# Patient Record
Sex: Male | Born: 1960 | Race: Black or African American | Hispanic: No | Marital: Married | Smoking: Never smoker
Health system: Southern US, Community
[De-identification: ages and names within clinical notes are randomized; demographics above are authoritative.]

## PROBLEM LIST (undated history)

## (undated) DIAGNOSIS — I639 Cerebral infarction, unspecified: Secondary | ICD-10-CM

## (undated) DIAGNOSIS — G629 Polyneuropathy, unspecified: Secondary | ICD-10-CM

## (undated) DIAGNOSIS — I10 Essential (primary) hypertension: Secondary | ICD-10-CM

## (undated) DIAGNOSIS — N289 Disorder of kidney and ureter, unspecified: Secondary | ICD-10-CM

## (undated) DIAGNOSIS — E119 Type 2 diabetes mellitus without complications: Secondary | ICD-10-CM

## (undated) HISTORY — PX: TONSILLECTOMY: SUR1361

---

## 2016-07-27 ENCOUNTER — Emergency Department
Admission: EM | Admit: 2016-07-27 | Discharge: 2016-07-27 | Disposition: A | Discharge status: Home / Self Care | Payer: Medicaid Other | Attending: Emergency Medicine | Admitting: Emergency Medicine

## 2016-07-27 DIAGNOSIS — E860 Dehydration: Secondary | ICD-10-CM | POA: Diagnosis not present

## 2016-07-27 DIAGNOSIS — N179 Acute kidney failure, unspecified: Secondary | ICD-10-CM | POA: Diagnosis not present

## 2016-07-27 DIAGNOSIS — R252 Cramp and spasm: Secondary | ICD-10-CM | POA: Diagnosis present

## 2016-07-27 LAB — CBC WITH DIFFERENTIAL/PLATELET
BASOS PCT: 1 %
Basophils Absolute: 0 10*3/uL (ref 0–0.1)
Eosinophils Absolute: 0.4 10*3/uL (ref 0–0.7)
Eosinophils Relative: 4 %
HEMATOCRIT: 38.9 % — AB (ref 40.0–52.0)
Hemoglobin: 13 g/dL (ref 13.0–18.0)
LYMPHS ABS: 1.4 10*3/uL (ref 1.0–3.6)
LYMPHS PCT: 15 %
MCH: 25 pg — AB (ref 26.0–34.0)
MCHC: 33.4 g/dL (ref 32.0–36.0)
MCV: 74.8 fL — AB (ref 80.0–100.0)
MONO ABS: 0.8 10*3/uL (ref 0.2–1.0)
MONOS PCT: 9 %
NEUTROS ABS: 6.8 10*3/uL — AB (ref 1.4–6.5)
Neutrophils Relative %: 71 %
Platelets: 230 10*3/uL (ref 150–440)
RBC: 5.2 MIL/uL (ref 4.40–5.90)
RDW: 14.9 % — AB (ref 11.5–14.5)
WBC: 9.5 10*3/uL (ref 3.8–10.6)

## 2016-07-27 LAB — COMPREHENSIVE METABOLIC PANEL
ALK PHOS: 143 U/L — AB (ref 38–126)
ALT: 25 U/L (ref 17–63)
ANION GAP: 7 (ref 5–15)
AST: 32 U/L (ref 15–41)
Albumin: 3.7 g/dL (ref 3.5–5.0)
BUN: 33 mg/dL — ABNORMAL HIGH (ref 6–20)
CALCIUM: 8.9 mg/dL (ref 8.9–10.3)
CO2: 27 mmol/L (ref 22–32)
Chloride: 105 mmol/L (ref 101–111)
Creatinine, Ser: 2.01 mg/dL — ABNORMAL HIGH (ref 0.61–1.24)
GFR, EST AFRICAN AMERICAN: 41 mL/min — AB (ref >60)
GFR, EST NON AFRICAN AMERICAN: 35 mL/min — AB (ref >60)
Glucose, Bld: 124 mg/dL — ABNORMAL HIGH (ref 65–99)
POTASSIUM: 3.8 mmol/L (ref 3.5–5.1)
Sodium: 139 mmol/L (ref 135–145)
TOTAL PROTEIN: 7.2 g/dL (ref 6.5–8.1)

## 2016-07-27 LAB — BASIC METABOLIC PANEL
Anion gap: 7 (ref 5–15)
BUN: 33 mg/dL — AB (ref 6–20)
CALCIUM: 8.6 mg/dL — AB (ref 8.9–10.3)
CHLORIDE: 105 mmol/L (ref 101–111)
CO2: 26 mmol/L (ref 22–32)
CREATININE: 1.8 mg/dL — AB (ref 0.61–1.24)
GFR, EST AFRICAN AMERICAN: 47 mL/min — AB (ref >60)
GFR, EST NON AFRICAN AMERICAN: 40 mL/min — AB (ref >60)
Glucose, Bld: 177 mg/dL — ABNORMAL HIGH (ref 65–99)
Potassium: 4 mmol/L (ref 3.5–5.1)
SODIUM: 138 mmol/L (ref 135–145)

## 2016-07-27 LAB — CK: CK TOTAL: 682 U/L — AB (ref 49–397)

## 2016-07-27 LAB — GLUCOSE, CAPILLARY: GLUCOSE-CAPILLARY: 231 mg/dL — AB (ref 65–99)

## 2016-07-27 LAB — TROPONIN I

## 2016-07-27 MED ORDER — SODIUM CHLORIDE 0.9 % IV BOLUS (SEPSIS)
1000.0000 mL | Freq: Once | INTRAVENOUS | Status: AC
  Administered 2016-07-27: 1000 mL via INTRAVENOUS

## 2016-07-27 NOTE — ED Notes (Signed)
MD Jimmye Norman ordered BMP to be drawn after completion of NaCl bolus

## 2016-07-27 NOTE — ED Notes (Addendum)
Reviewed d/c instructions, follow-up care with pt. Pt verbalized understanding 

## 2016-07-27 NOTE — ED Notes (Signed)
Pt c/o generalized body cramps beginning today. Pt had been up for 4 days getting ready for daughter's wedding.

## 2016-07-27 NOTE — ED Triage Notes (Signed)
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

## 2016-07-27 NOTE — ED Provider Notes (Signed)
[facility] Emergency Department Provider Note        Time seen: ----------------------------------------- 7:07 PM on 07/27/2016 -----------------------------------------    I have reviewed the triage vital signs and the nursing notes.   HISTORY  Chief Complaint Dehydration    HPI Bobby Sampson is a 55 y.o. male who presents the ER from home after sudden muscle cramping all over his body. Patient states he has been sleeping well for the past 4 days because his been cooking for his daughter's wedding. Patient states his only slept a few hours, has not been drinking alcohol but felt dehydrated. After 500 cc of normal saline he felt some better but then the cramping restarted. He denies fever or recent illness. He denies any complaints at this time.   No past medical history on file.  There are no active problems to display for this patient.   No past surgical history on file.  Allergies Review of patient's allergies indicates no known allergies.  Social History Social History  Substance Use Topics  . Smoking status: Not on file  . Smokeless tobacco: Not on file  . Alcohol use Not on file    Review of Systems Constitutional: Negative for fever. Cardiovascular: Negative for chest pain. Respiratory: Negative for shortness of breath. Gastrointestinal: Negative for abdominal pain, vomiting and diarrhea. Genitourinary: Negative for dysuria. Musculoskeletal: Positive for muscle cramps Skin: Negative for rash. Neurological: Negative for headaches, positive for generalized weakness  10-point ROS otherwise negative.  ____________________________________________   PHYSICAL EXAM:  VITAL SIGNS: ED Triage Vitals  Enc Vitals Group     BP 07/27/16 1903 (!) 147/77     Pulse Rate 07/27/16 1903 91     Resp 07/27/16 1903 18     Temp 07/27/16 1903 97.7 F (36.5 C)     Temp Source 07/27/16 1903 Oral     SpO2 07/27/16 1903 99 %     Weight 07/27/16 1904  225 lb (102.1 kg)     Height 07/27/16 1904 5\' 9"  (1.753 m)     Head Circumference --      Peak Flow --      Pain Score 07/27/16 1905 3     Pain Loc --      Pain Edu? --      Excl. in [place]? --     Constitutional: Alert and oriented. Well appearing and in no distress. Eyes: Conjunctivae are normal. PERRL. Normal extraocular movements. ENT   Head: Normocephalic and atraumatic.   Nose: No congestion/rhinnorhea.   Mouth/Throat: Mucous membranes are moist.   Neck: No stridor. Cardiovascular: Normal rate, regular rhythm. No murmurs, rubs, or gallops. Respiratory: Normal respiratory effort without tachypnea nor retractions. Breath sounds are clear and equal bilaterally. No wheezes/rales/rhonchi. Gastrointestinal: Soft and nontender. Normal bowel sounds Musculoskeletal: Nontender with normal range of motion in all extremities. No lower extremity tenderness nor edema. Neurologic:  Normal speech and language. No gross focal neurologic deficits are appreciated.  Skin:  Skin is warm, dry and intact. No rash noted. Psychiatric: Mood and affect are normal. Speech and behavior are normal.  ___________________________________________  ED COURSE:  Pertinent labs & imaging results that were available during my care of the patient were reviewed by me and considered in my medical decision making (see chart for details). Clinical Course  Patient is in no distress, I will check basic labs and continue saline infusion.  Procedures ____________________________________________   LABS (pertinent positives/negatives)  Labs Reviewed  CBC WITH DIFFERENTIAL/PLATELET - Abnormal; Notable  for the following:       Result Value   HCT 38.9 (*)    MCV 74.8 (*)    MCH 25.0 (*)    RDW 14.9 (*)    Neutro Abs 6.8 (*)    All other components within normal limits  COMPREHENSIVE METABOLIC PANEL - Abnormal; Notable for the following:    Glucose, Bld 124 (*)    BUN 33 (*)    Creatinine, Ser 2.01 (*)     Alkaline Phosphatase 143 (*)    Total Bilirubin <0.1 (*)    GFR calc non Af Amer 35 (*)    GFR calc Af Amer 41 (*)    All other components within normal limits  CK - Abnormal; Notable for the following:    Total CK 682 (*)    All other components within normal limits  BASIC METABOLIC PANEL - Abnormal; Notable for the following:    Glucose, Bld 177 (*)    BUN 33 (*)    Creatinine, Ser 1.80 (*)    Calcium 8.6 (*)    GFR calc non Af Amer 40 (*)    GFR calc Af Amer 47 (*)    All other components within normal limits  TROPONIN I   ____________________________________________  FINAL ASSESSMENT AND PLAN  Dehydration, muscle cramps, Acute kidney injury  Plan: Patient with labs as dictated above. Patient did show improvement in his creatinine after 2.5 L of IV fluid. He currently feels better, I will advise follow-up in the next 1-2 days with his doctor for recheck. Advised light activity and increase by mouth intake. He is stable for discharge.   Earleen Newport, MD   Note: This dictation was prepared with Dragon dictation. Any transcriptional errors that result from this process are unintentional    Earleen Newport, MD 07/27/16 2151

## 2016-11-21 ENCOUNTER — Other Ambulatory Visit: Payer: Self-pay | Admitting: Otolaryngology

## 2016-11-21 DIAGNOSIS — H9012 Conductive hearing loss, unilateral, left ear, with unrestricted hearing on the contralateral side: Secondary | ICD-10-CM

## 2016-11-21 DIAGNOSIS — H9212 Otorrhea, left ear: Secondary | ICD-10-CM

## 2016-11-27 ENCOUNTER — Ambulatory Visit
Admission: RE | Admit: 2016-11-27 | Discharge: 2016-11-27 | Disposition: A | Discharge status: Home / Self Care | Payer: No Typology Code available for payment source | Source: Ambulatory Visit | Attending: Otolaryngology | Admitting: Otolaryngology

## 2016-11-27 ENCOUNTER — Ambulatory Visit: Payer: Medicaid Other

## 2016-11-27 DIAGNOSIS — H9012 Conductive hearing loss, unilateral, left ear, with unrestricted hearing on the contralateral side: Secondary | ICD-10-CM | POA: Diagnosis not present

## 2016-11-27 DIAGNOSIS — G936 Cerebral edema: Secondary | ICD-10-CM | POA: Diagnosis not present

## 2016-11-27 DIAGNOSIS — H9212 Otorrhea, left ear: Secondary | ICD-10-CM | POA: Insufficient documentation

## 2016-11-27 IMAGING — CT CT TEMPORAL BONES W/O CM
1 of 2 series · 15 of 30 positions shown, 19 images · non-contrast
Comparison: None.

CLINICAL DATA: Conductive hearing loss left ear with otorrhea

EXAM:
CT TEMPORAL BONES WITHOUT CONTRAST
TECHNIQUE: Axial and coronal plane CT imaging of the petrous temporal bones was
performed with thin-collimation image reconstruction. No intravenous
contrast was administered. Multiplanar CT image reconstructions were
also generated.

[Series 5: ax mag right · axial · 0.20mm/px · z∈[-87,-34]mm · 15 of 98 slices shown, 19 images]
[im 5/98  brain]
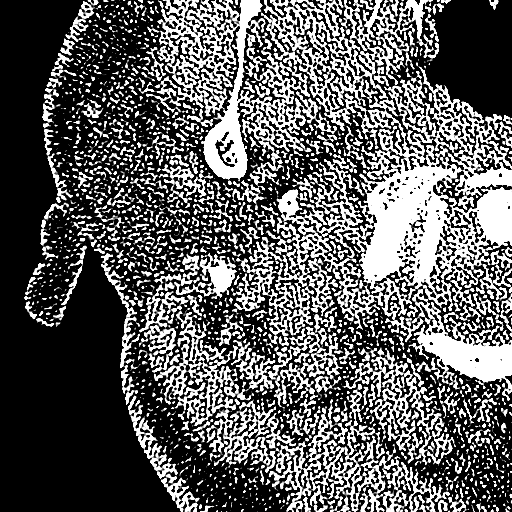
[im 5/98  bone]
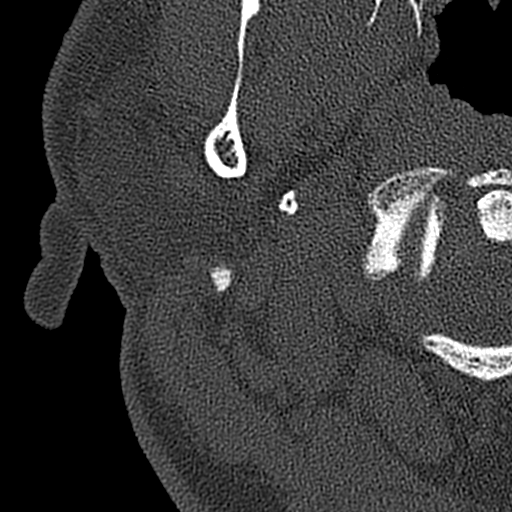
[im 14/98  bone]
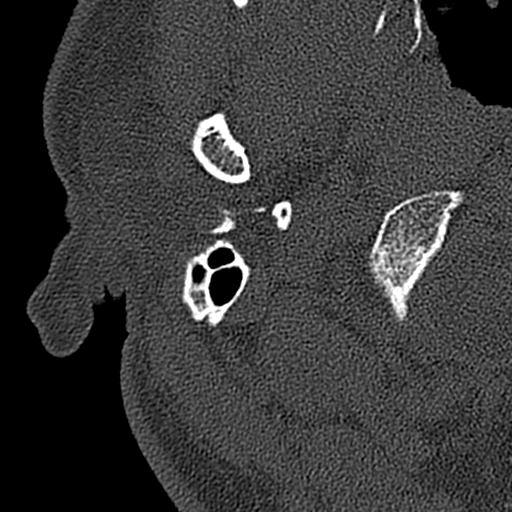
[im 18/98  bone]
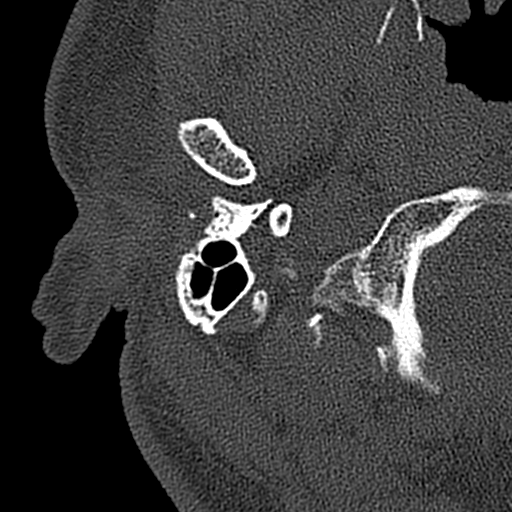
[im 23/98  bone]
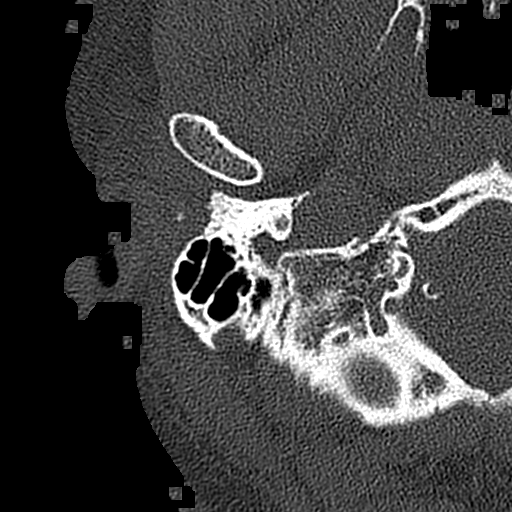
[im 31/98  brain]
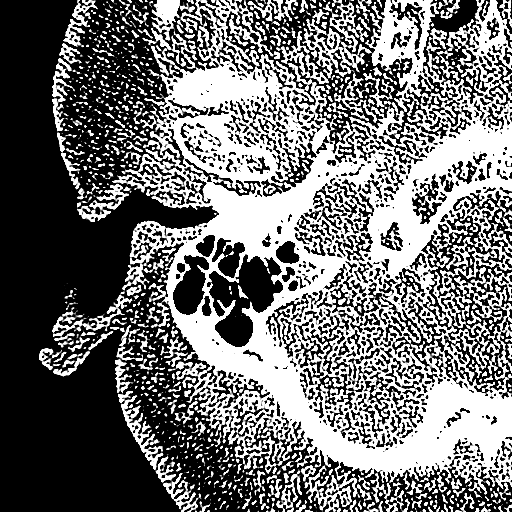
[im 31/98  bone]
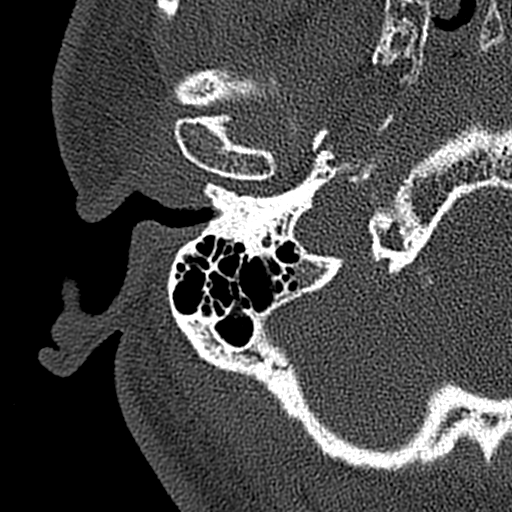
[im 36/98  bone]
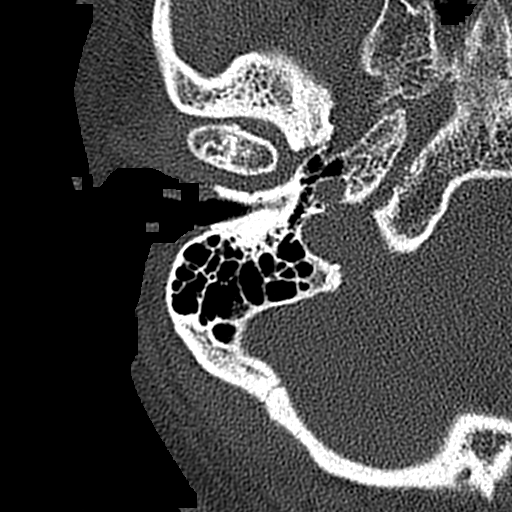
[im 45/98  bone]
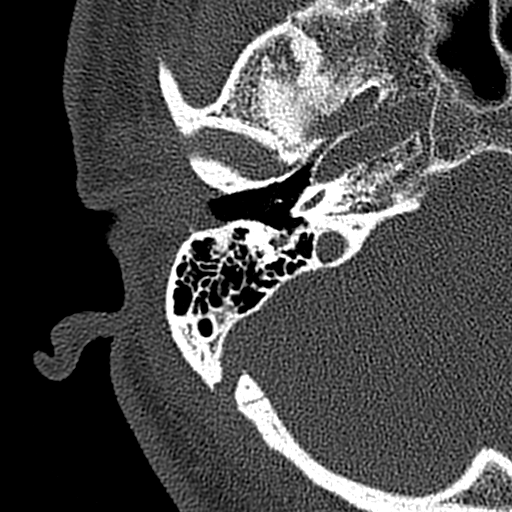
[im 49/98  bone]
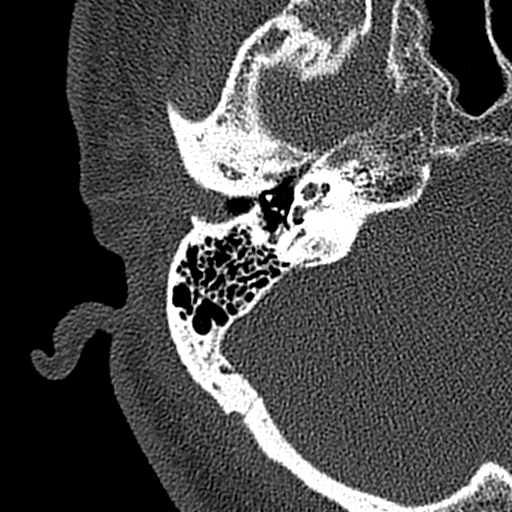
[im 53/98  brain]
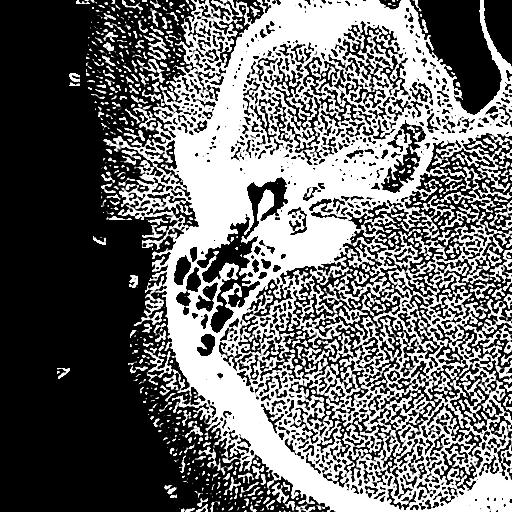
[im 53/98  bone]
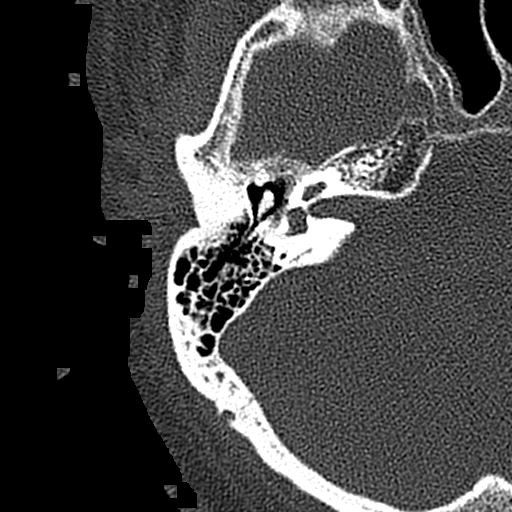
[im 62/98  bone]
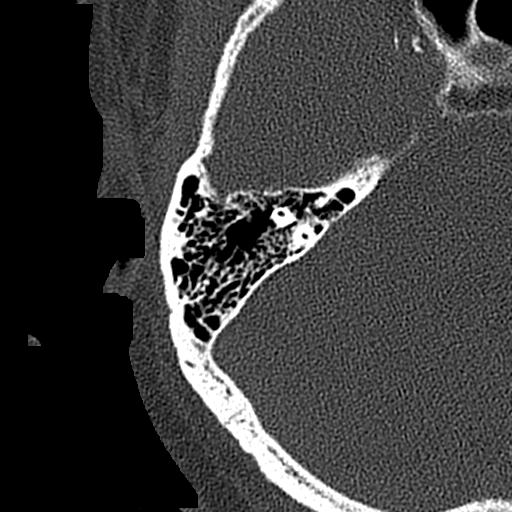
[im 67/98  bone]
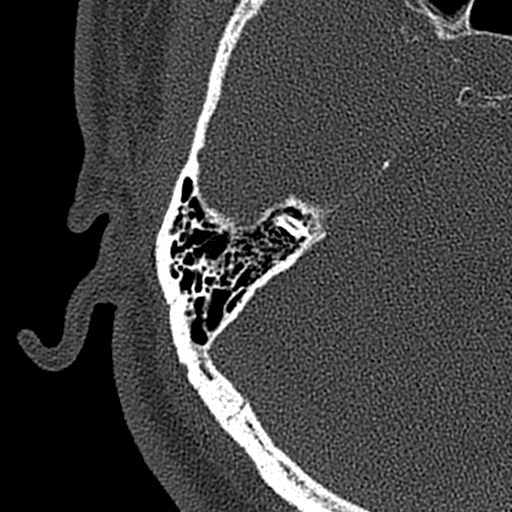
[im 75/98  bone]
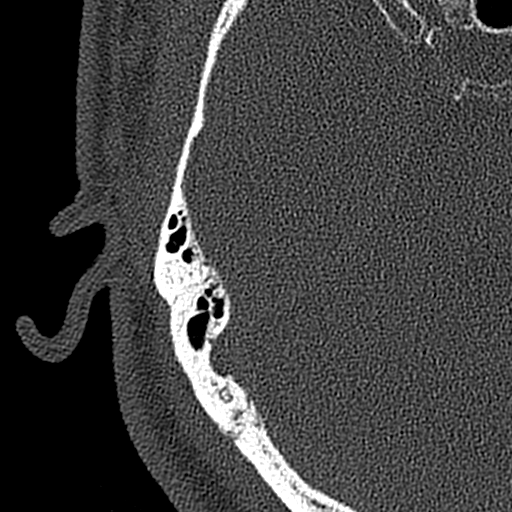
[im 80/98  brain]
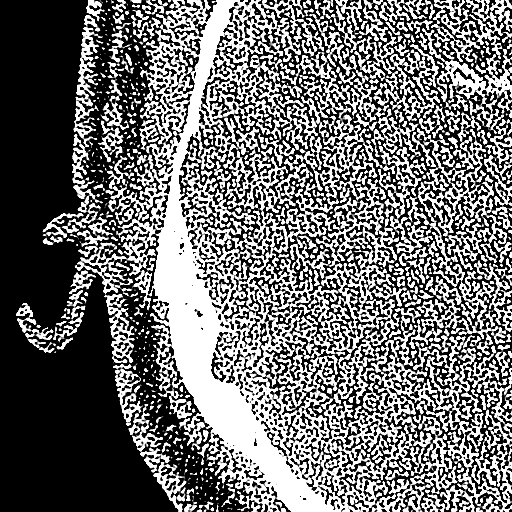
[im 80/98  bone]
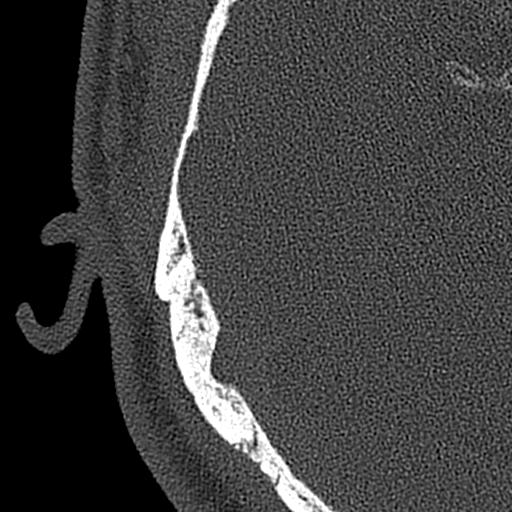
[im 84/98  bone]
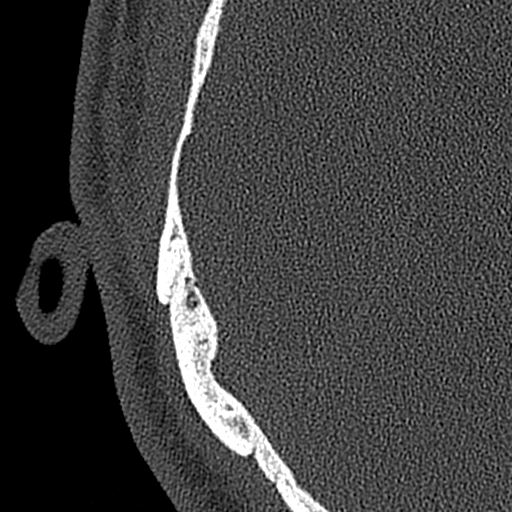
[im 93/98  bone]
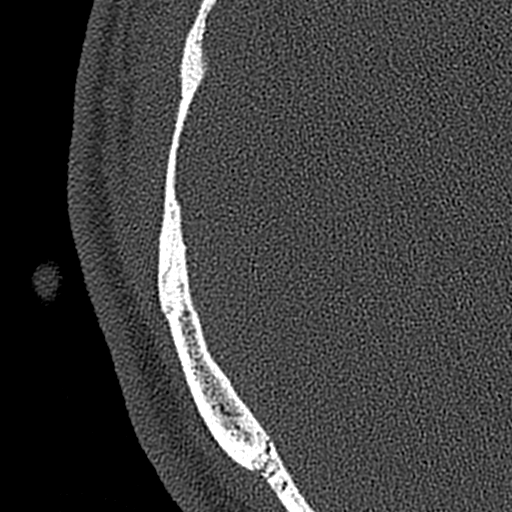

[15 of 30 positions shown; findings below may reference images not displayed]

FINDINGS: Mucosal edema in the paranasal sinuses. Central skullbase intact.
Degenerative changes in the right TMJ with cyst in the mandibular
condyle.

Right temporal bone: Right mastoid sinus well developed and clear.
Right middle ear clear. [place] normal. Tympanic membrane normal.
Negative for cholesteatoma. Internal and external auditory canal
normal. Cochlea and semi circular canals normal.

Left temporal bone: Left mastoid sinus well developed. Large left
mastoid effusion. Lateral wall the mastoid sinus is ill-defined with
bony erosion. There is considerable soft tissue swelling lateral to
the mastoid tip which may be due to infection. Subperiosteal abscess
to be difficult to exclude without intravenous contrast. There is
extensive soft tissue swelling extending into the external auditory
canal which is markedly narrowed and appears occluded. Mild
thickening of the tympanic membrane. Ossicles normal. Negative for
cholesteatoma. Internal auditory canal normal. Cochlea and semi
circular canals normal.
IMPRESSION: Left mastoid sinus effusion. Irregular erosion of the lateral wall
of the mastoid sinus with adjacent soft tissue swelling. Findings
consistent with bone infection and adjacent soft tissue swelling.
Subperiosteal abscess difficult to exclude without intravenous
contrast. Extensive soft tissue swelling filling the left external
auditory canal. Mild thickening of the tympanic membrane without
significant middle ear effusion. Negative for cholesteatoma

Normal right temporal bone.

## 2017-04-06 ENCOUNTER — Other Ambulatory Visit: Payer: Self-pay | Admitting: Otolaryngology

## 2017-04-06 DIAGNOSIS — H7012 Chronic mastoiditis, left ear: Secondary | ICD-10-CM

## 2017-04-09 ENCOUNTER — Ambulatory Visit
Admission: RE | Admit: 2017-04-09 | Discharge: 2017-04-09 | Disposition: A | Discharge status: Home / Self Care | Payer: No Typology Code available for payment source | Source: Ambulatory Visit | Attending: Otolaryngology | Admitting: Otolaryngology

## 2017-04-09 DIAGNOSIS — H7012 Chronic mastoiditis, left ear: Secondary | ICD-10-CM | POA: Insufficient documentation

## 2017-04-09 IMAGING — CT CT TEMPORAL BONES W/O CM
2 of 3 series · 15 of 30 positions shown, 18 images · non-contrast
Comparison: CT temporal done [DATE]

CLINICAL DATA: Chronic left-sided mastoiditis. Left-sided ear
infection and hearing loss.

EXAM:
CT TEMPORAL BONES WITHOUT CONTRAST
TECHNIQUE: Axial and coronal plane CT imaging of the petrous temporal bones was
performed with thin-collimation image reconstruction. No intravenous
contrast was administered. Multiplanar CT image reconstructions were
also generated.

[Series 5: ax mag right · axial · 0.20mm/px · z∈[-81,-20]mm · 11 of 123 slices shown, 14 images]
[im 11/123  brain]
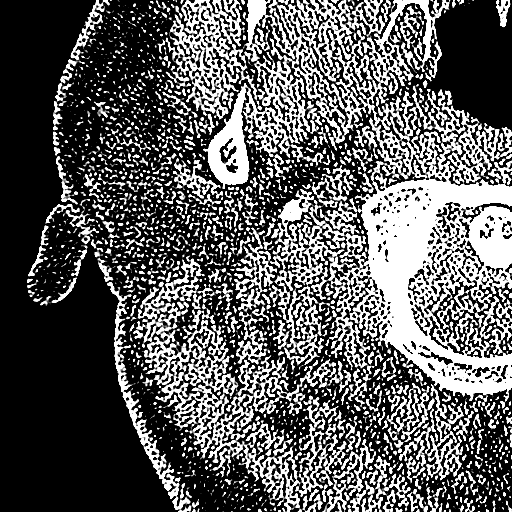
[im 11/123  bone]
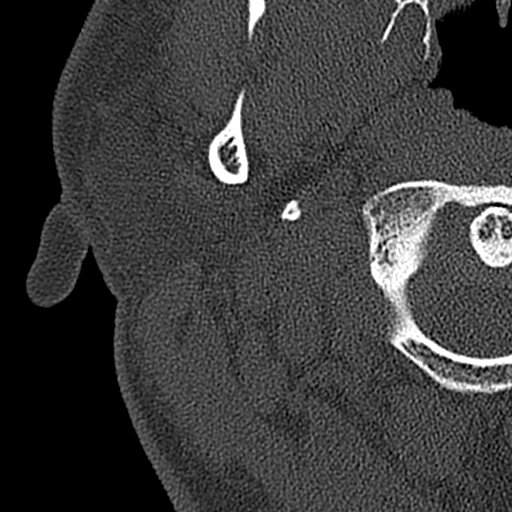
[im 21/123  bone]
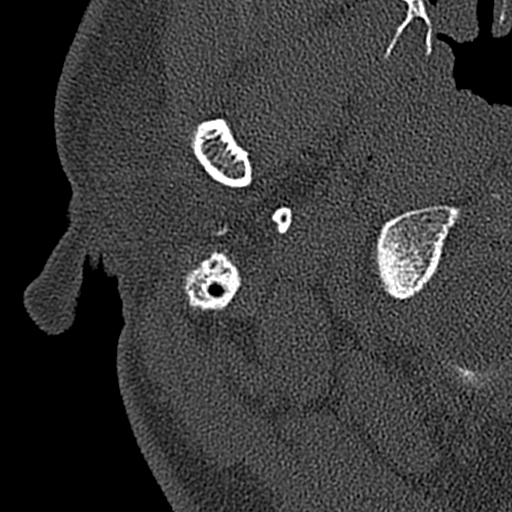
[im 31/123  bone]
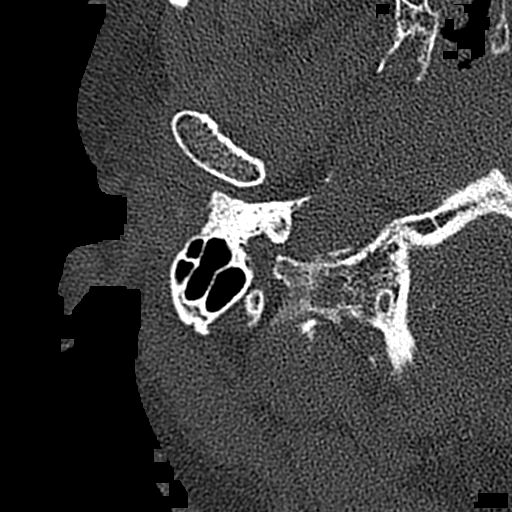
[im 41/123  bone]
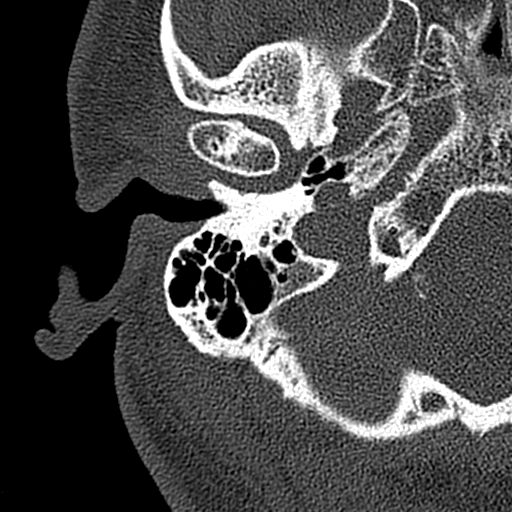
[im 51/123  brain]
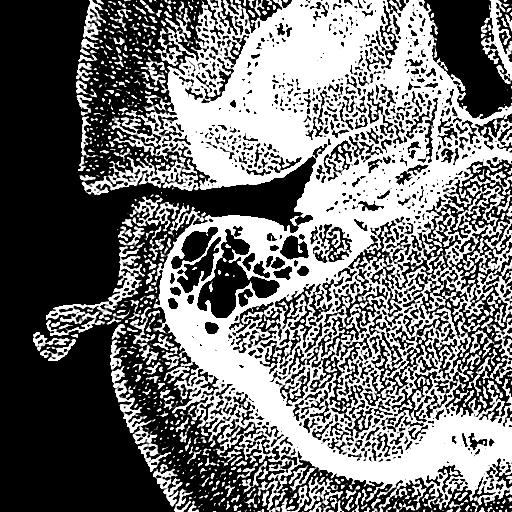
[im 51/123  bone]
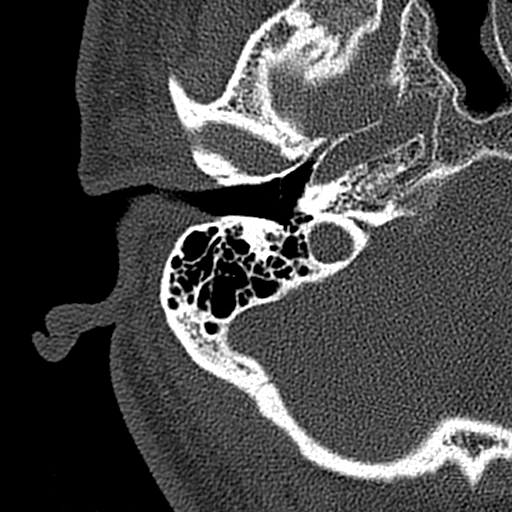
[im 62/123  bone]
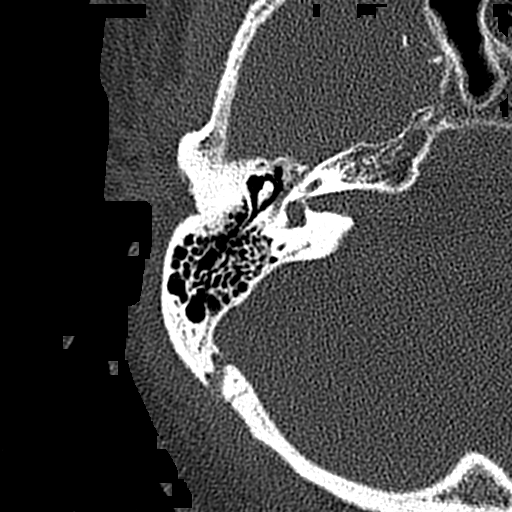
[im 72/123  bone]
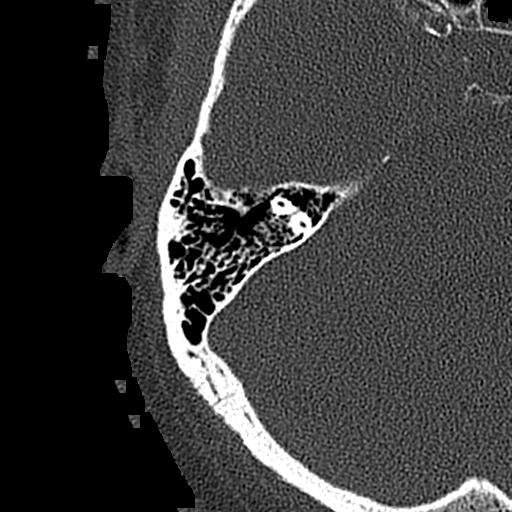
[im 82/123  bone]
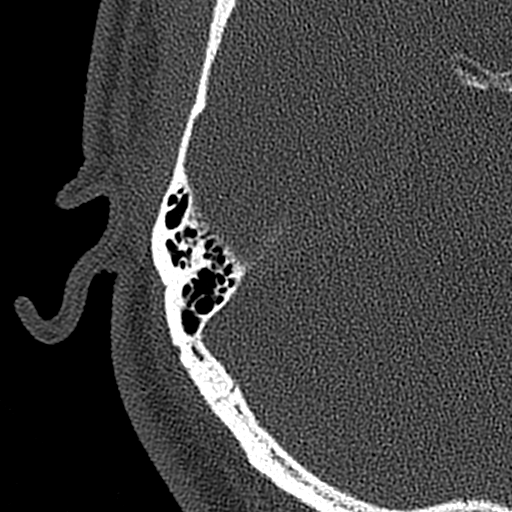
[im 92/123  brain]
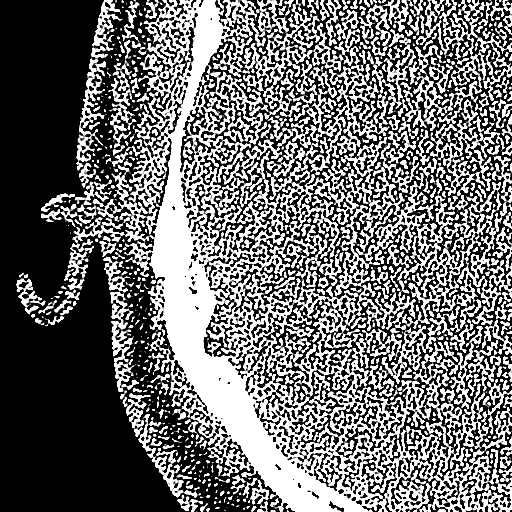
[im 92/123  bone]
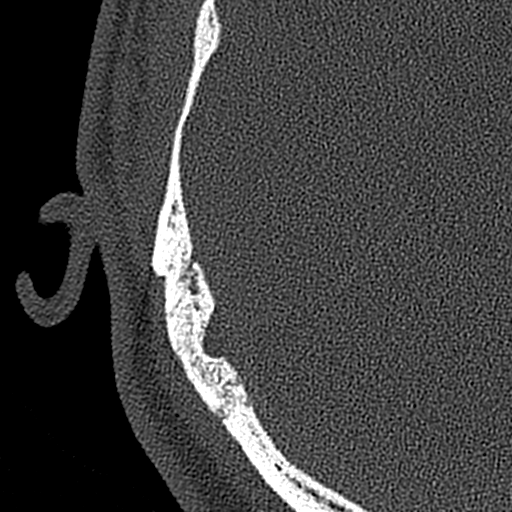
[im 102/123  bone]
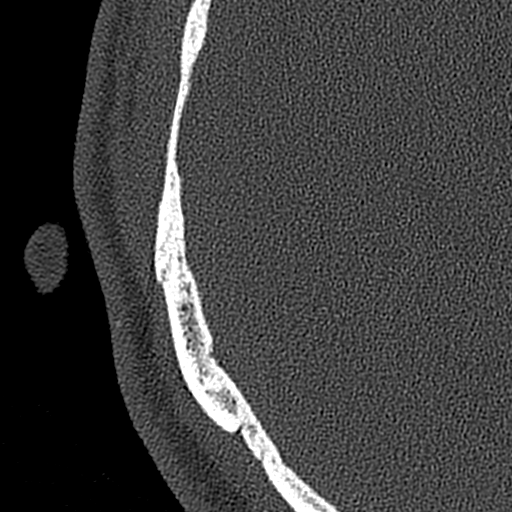
[im 112/123  bone]
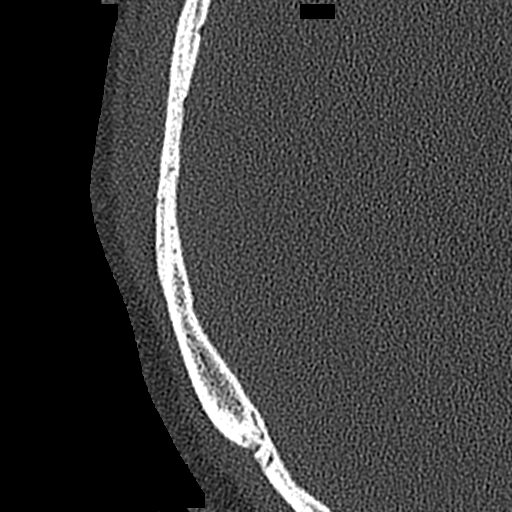

[Series 7: ax mag left · axial · 0.20mm/px · z∈[-81,-57]mm · 4 of 123 slices shown]
[im 11/123  bone]
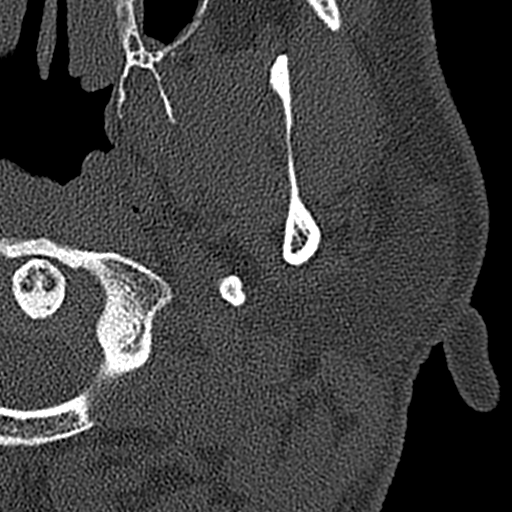
[im 31/123  bone]
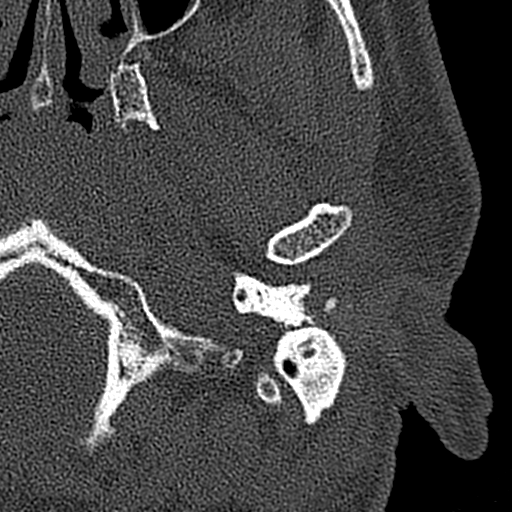
[im 41/123  bone]
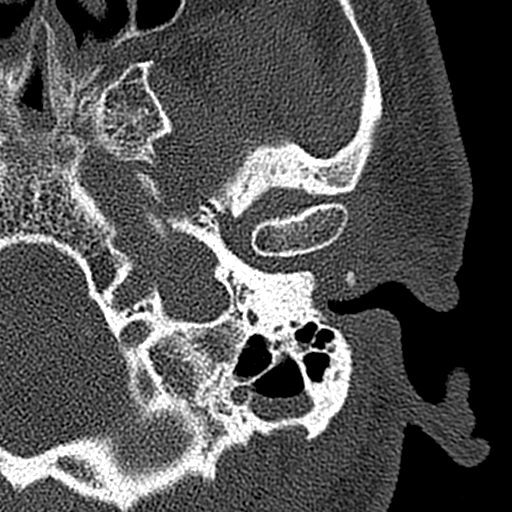
[im 51/123  bone]
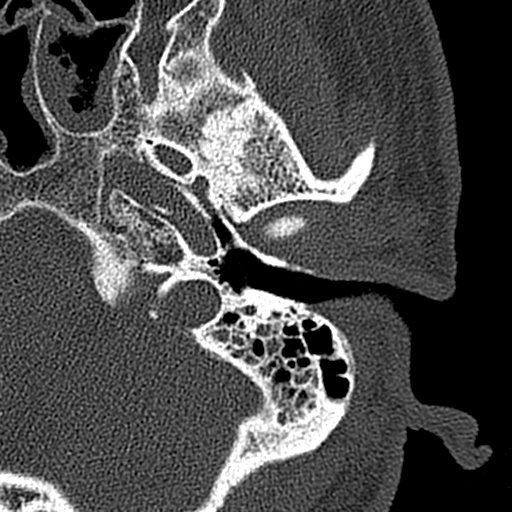

[15 of 30 positions shown; findings below may reference images not displayed]

FINDINGS: Limited imaging of the brain is within normal limits. A left lens
replacement is noted. The globes and orbits are otherwise within
normal limits.

Circumferential mucosal thickening is present in the sphenoid
sinuses bilaterally, left greater than right. There is some fluid in
the left sphenoid sinus is well. Circumferential mucosal thickening
is noted in the left maxillary sinus.

The right external auditory canal is clear. The tympanic membrane is
visualized and intact. The middle ear cavity is clear. The middle
ear ossicles are normally formed and articulating. The oval window
is patent. The epitympanum and mastoid air cells are clear. The
inner ear structures are normally formed. The superior and lateral
semicircular canals are covered. The internal auditory canal and
vestibular aqueduct are within normal limits.

The right external auditory canal is clear. The tympanic membrane is
visualized and intact. The left middle ear ossicles are normally
formed and articulating. Previously noted EAC mucosal disease and
tympanic membrane thickening has resolved. The middle ear cavity and
epitympanum are clear. There is some residual fluid within inferior
ethmoid air cells. Previous erosion along the lateral mastoid and
extensive soft tissue swelling has resolved. The bone appears to
have healed. There is no fistula evident.

The left inner ear structures are normally formed. The left superior
and lateral semicircular canals are covered. The internal auditory
canal and vestibular aqueduct are within normal limits.
IMPRESSION: 1. Interval healing of permeative bone changes in the left mastoid
and resolution of extensive soft tissue swelling.
2. Resolution of mucosal disease within the left external auditory
canal and thickening of the left tympanic membrane.
3. There is some residual fluid in the inferior left mastoid air
cells without coalescence.
4. The right temporal bone is within normal limits.
5. Left maxillary and bilateral sphenoid sinus disease as described.

## 2019-11-07 ENCOUNTER — Emergency Department: Payer: Medicaid Other

## 2019-11-07 ENCOUNTER — Other Ambulatory Visit: Payer: Self-pay

## 2019-11-07 ENCOUNTER — Inpatient Hospital Stay
Admission: EM | Admit: 2019-11-07 | Discharge: 2019-11-11 | DRG: 177 | Disposition: A | Discharge status: Home / Self Care | Payer: Medicaid Other | Attending: Internal Medicine | Admitting: Internal Medicine

## 2019-11-07 DIAGNOSIS — E1122 Type 2 diabetes mellitus with diabetic chronic kidney disease: Secondary | ICD-10-CM | POA: Diagnosis present

## 2019-11-07 DIAGNOSIS — Z794 Long term (current) use of insulin: Secondary | ICD-10-CM

## 2019-11-07 DIAGNOSIS — K219 Gastro-esophageal reflux disease without esophagitis: Secondary | ICD-10-CM | POA: Diagnosis present

## 2019-11-07 DIAGNOSIS — E1165 Type 2 diabetes mellitus with hyperglycemia: Secondary | ICD-10-CM | POA: Diagnosis present

## 2019-11-07 DIAGNOSIS — J1289 Other viral pneumonia: Secondary | ICD-10-CM | POA: Diagnosis present

## 2019-11-07 DIAGNOSIS — Z91128 Patient's intentional underdosing of medication regimen for other reason: Secondary | ICD-10-CM | POA: Diagnosis not present

## 2019-11-07 DIAGNOSIS — E785 Hyperlipidemia, unspecified: Secondary | ICD-10-CM | POA: Diagnosis present

## 2019-11-07 DIAGNOSIS — N189 Chronic kidney disease, unspecified: Secondary | ICD-10-CM | POA: Diagnosis not present

## 2019-11-07 DIAGNOSIS — U071 COVID-19: Secondary | ICD-10-CM | POA: Diagnosis present

## 2019-11-07 DIAGNOSIS — E875 Hyperkalemia: Secondary | ICD-10-CM

## 2019-11-07 DIAGNOSIS — E1159 Type 2 diabetes mellitus with other circulatory complications: Secondary | ICD-10-CM

## 2019-11-07 DIAGNOSIS — E1142 Type 2 diabetes mellitus with diabetic polyneuropathy: Secondary | ICD-10-CM | POA: Diagnosis present

## 2019-11-07 DIAGNOSIS — T383X6A Underdosing of insulin and oral hypoglycemic [antidiabetic] drugs, initial encounter: Secondary | ICD-10-CM | POA: Diagnosis present

## 2019-11-07 DIAGNOSIS — I129 Hypertensive chronic kidney disease with stage 1 through stage 4 chronic kidney disease, or unspecified chronic kidney disease: Secondary | ICD-10-CM | POA: Diagnosis present

## 2019-11-07 DIAGNOSIS — E11621 Type 2 diabetes mellitus with foot ulcer: Secondary | ICD-10-CM | POA: Diagnosis present

## 2019-11-07 DIAGNOSIS — L97529 Non-pressure chronic ulcer of other part of left foot with unspecified severity: Secondary | ICD-10-CM | POA: Diagnosis present

## 2019-11-07 DIAGNOSIS — J9601 Acute respiratory failure with hypoxia: Secondary | ICD-10-CM | POA: Diagnosis present

## 2019-11-07 DIAGNOSIS — Z79899 Other long term (current) drug therapy: Secondary | ICD-10-CM

## 2019-11-07 DIAGNOSIS — N1831 Chronic kidney disease, stage 3a: Secondary | ICD-10-CM | POA: Diagnosis present

## 2019-11-07 DIAGNOSIS — K5900 Constipation, unspecified: Secondary | ICD-10-CM | POA: Diagnosis present

## 2019-11-07 DIAGNOSIS — I10 Essential (primary) hypertension: Secondary | ICD-10-CM | POA: Diagnosis not present

## 2019-11-07 DIAGNOSIS — N179 Acute kidney failure, unspecified: Secondary | ICD-10-CM

## 2019-11-07 DIAGNOSIS — L97509 Non-pressure chronic ulcer of other part of unspecified foot with unspecified severity: Secondary | ICD-10-CM | POA: Diagnosis not present

## 2019-11-07 HISTORY — DX: Polyneuropathy, unspecified: G62.9

## 2019-11-07 HISTORY — DX: Essential (primary) hypertension: I10

## 2019-11-07 HISTORY — DX: Disorder of kidney and ureter, unspecified: N28.9

## 2019-11-07 HISTORY — DX: Type 2 diabetes mellitus without complications: E11.9

## 2019-11-07 LAB — TROPONIN I (HIGH SENSITIVITY)
Troponin I (High Sensitivity): 32 ng/L — ABNORMAL HIGH (ref <18)
Troponin I (High Sensitivity): 35 ng/L — ABNORMAL HIGH (ref <18)

## 2019-11-07 LAB — BASIC METABOLIC PANEL
Anion gap: 14 (ref 5–15)
BUN: 58 mg/dL — ABNORMAL HIGH (ref 6–20)
CO2: 20 mmol/L — ABNORMAL LOW (ref 22–32)
Calcium: 8.3 mg/dL — ABNORMAL LOW (ref 8.9–10.3)
Chloride: 96 mmol/L — ABNORMAL LOW (ref 98–111)
Creatinine, Ser: 3.97 mg/dL — ABNORMAL HIGH (ref 0.61–1.24)
GFR calc Af Amer: 18 mL/min — ABNORMAL LOW (ref >60)
GFR calc non Af Amer: 15 mL/min — ABNORMAL LOW (ref >60)
Glucose, Bld: 447 mg/dL — ABNORMAL HIGH (ref 70–99)
Potassium: 5.5 mmol/L — ABNORMAL HIGH (ref 3.5–5.1)
Sodium: 130 mmol/L — ABNORMAL LOW (ref 135–145)

## 2019-11-07 LAB — LACTIC ACID, PLASMA: Lactic Acid, Venous: 1.6 mmol/L (ref 0.5–1.9)

## 2019-11-07 LAB — CBC
HCT: 45.7 % (ref 39.0–52.0)
Hemoglobin: 14.3 g/dL (ref 13.0–17.0)
MCH: 23.7 pg — ABNORMAL LOW (ref 26.0–34.0)
MCHC: 31.3 g/dL (ref 30.0–36.0)
MCV: 75.8 fL — ABNORMAL LOW (ref 80.0–100.0)
Platelets: 185 10*3/uL (ref 150–400)
RBC: 6.03 MIL/uL — ABNORMAL HIGH (ref 4.22–5.81)
RDW: 14.5 % (ref 11.5–15.5)
WBC: 6.1 10*3/uL (ref 4.0–10.5)
nRBC: 0 % (ref 0.0–0.2)

## 2019-11-07 LAB — URINALYSIS, COMPLETE (UACMP) WITH MICROSCOPIC
Bacteria, UA: NONE SEEN
Bilirubin Urine: NEGATIVE
Glucose, UA: >=500 mg/dL — AB
Hgb urine dipstick: NEGATIVE
Ketones, ur: NEGATIVE mg/dL
Leukocytes,Ua: NEGATIVE
Nitrite: NEGATIVE
Protein, ur: 100 mg/dL — AB
Specific Gravity, Urine: 1.017 (ref 1.005–1.030)
pH: 5 (ref 5.0–8.0)

## 2019-11-07 LAB — LACTATE DEHYDROGENASE: LDH: 209 U/L — ABNORMAL HIGH (ref 98–192)

## 2019-11-07 LAB — FERRITIN: Ferritin: 176 ng/mL (ref 24–336)

## 2019-11-07 LAB — FIBRINOGEN: Fibrinogen: 638 mg/dL — ABNORMAL HIGH (ref 210–475)

## 2019-11-07 LAB — FIBRIN DERIVATIVES D-DIMER (ARMC ONLY): Fibrin derivatives D-dimer (ARMC): 1195.14 ng/mL (FEU) — ABNORMAL HIGH (ref 0.00–499.00)

## 2019-11-07 LAB — HEPATIC FUNCTION PANEL
ALT: 34 U/L (ref 0–44)
AST: 39 U/L (ref 15–41)
Albumin: 3.2 g/dL — ABNORMAL LOW (ref 3.5–5.0)
Alkaline Phosphatase: 118 U/L (ref 38–126)
Bilirubin, Direct: <0.1 mg/dL (ref 0.0–0.2)
Total Bilirubin: 0.4 mg/dL (ref 0.3–1.2)
Total Protein: 7.5 g/dL (ref 6.5–8.1)

## 2019-11-07 LAB — GLUCOSE, CAPILLARY
Glucose-Capillary: 449 mg/dL — ABNORMAL HIGH (ref 70–99)
Glucose-Capillary: 365 mg/dL — ABNORMAL HIGH (ref 70–99)

## 2019-11-07 LAB — PROCALCITONIN: Procalcitonin: 0.38 ng/mL

## 2019-11-07 LAB — TRIGLYCERIDES: Triglycerides: 198 mg/dL — ABNORMAL HIGH (ref <150)

## 2019-11-07 LAB — C-REACTIVE PROTEIN: CRP: 5.5 mg/dL — ABNORMAL HIGH (ref <1.0)

## 2019-11-07 IMAGING — CR DG CHEST 1V PORT
1 series · 1 of 1 positions shown · non-contrast
Comparison: None.

CLINICAL DATA: Weakness.

EXAM:
PORTABLE CHEST 1 VIEW

[x chest ap]
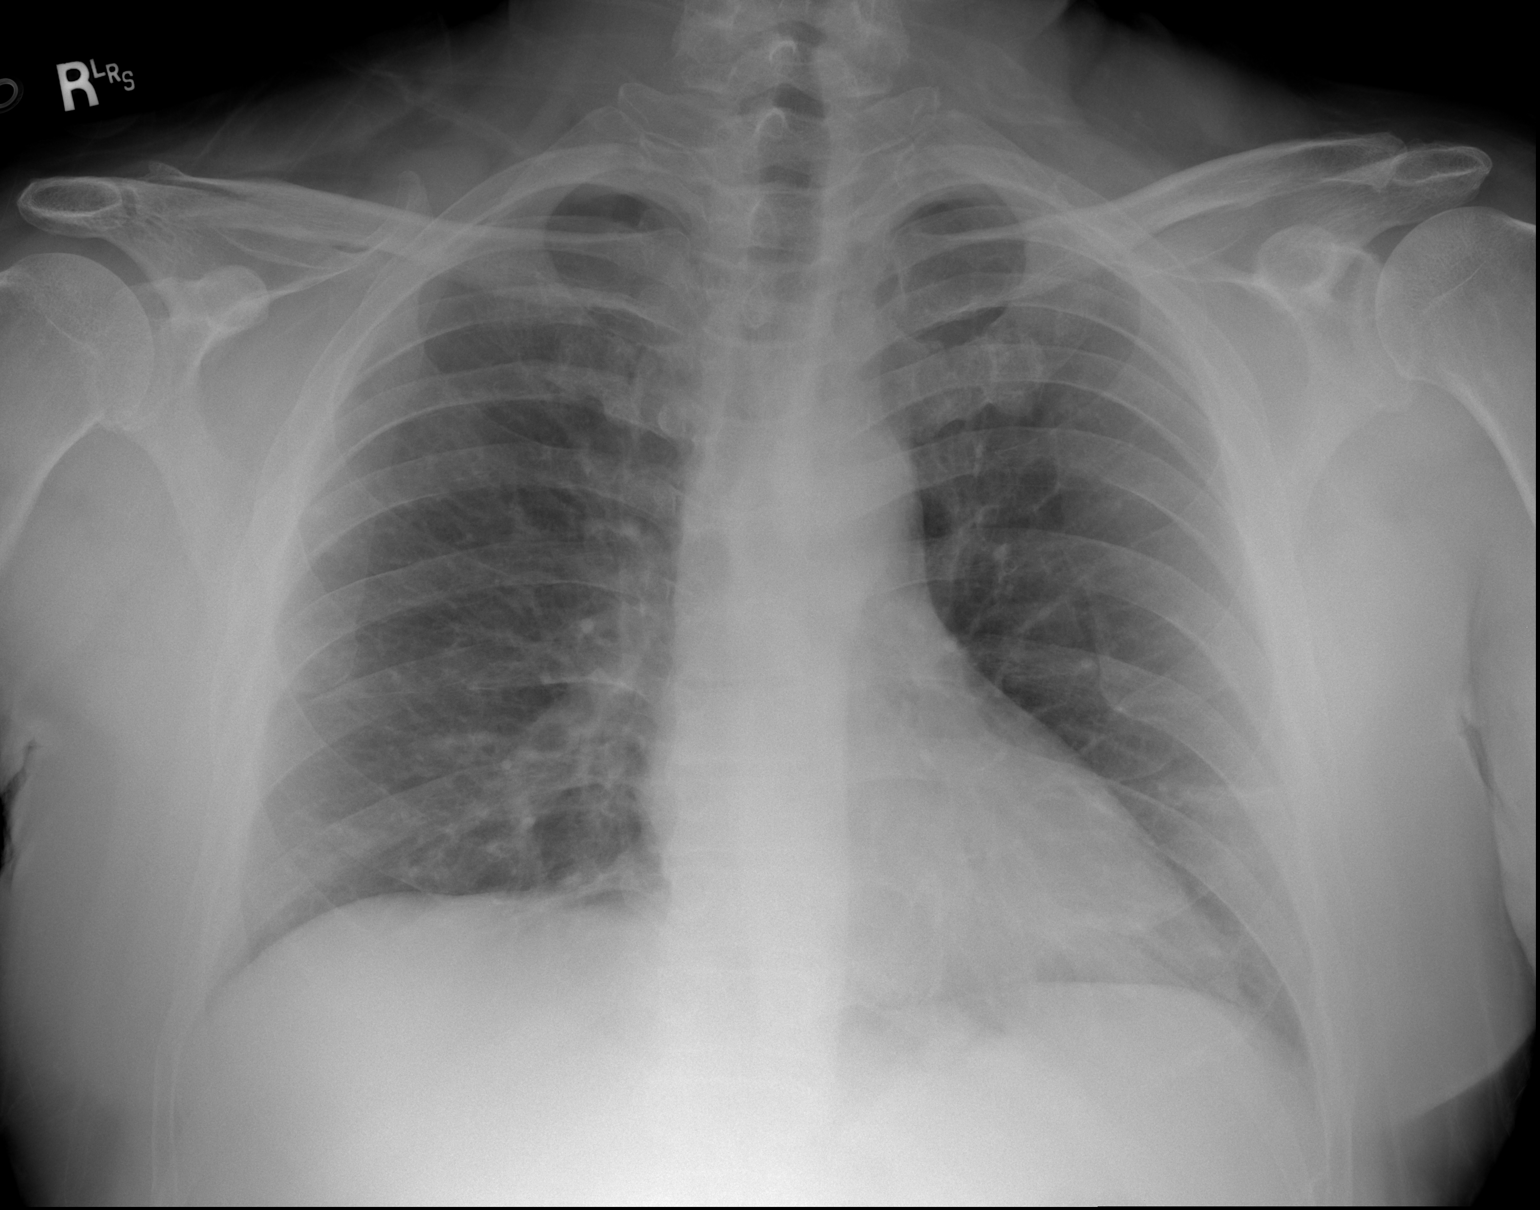

[1 of 1 positions shown; findings below may reference images not displayed]

FINDINGS: The heart size and mediastinal contours are within normal limits.
Both lungs are clear. No pneumothorax or pleural effusion is noted.
The visualized skeletal structures are unremarkable.
IMPRESSION: No active disease.

## 2019-11-07 MED ORDER — SODIUM CHLORIDE 0.9% FLUSH: 3.0000 mL | Freq: Once | INTRAVENOUS | Status: DC

## 2019-11-07 MED ORDER — DEXAMETHASONE SODIUM PHOSPHATE 10 MG/ML IJ SOLN
6.0000 mg | Freq: Once | INTRAMUSCULAR | Status: AC
  Administered 2019-11-07: 6 mg via INTRAVENOUS
  Filled 2019-11-07: qty 1

## 2019-11-07 MED ORDER — HEPARIN (PORCINE) 25000 UT/250ML-% IV SOLN
1500.0000 [IU]/h | INTRAVENOUS | Status: DC
  Administered 2019-11-08: 1500 [IU]/h via INTRAVENOUS
  Filled 2019-11-07: qty 250

## 2019-11-07 MED ORDER — INSULIN ASPART 100 UNIT/ML ~~LOC~~ SOLN
8.0000 [IU] | Freq: Once | SUBCUTANEOUS | Status: AC
  Administered 2019-11-07: 8 [IU] via INTRAVENOUS
  Filled 2019-11-07: qty 1

## 2019-11-07 MED ORDER — HEPARIN BOLUS VIA INFUSION
5500.0000 [IU] | Freq: Once | INTRAVENOUS | Status: AC
  Administered 2019-11-08: 5500 [IU] via INTRAVENOUS
  Filled 2019-11-07: qty 5500

## 2019-11-07 MED ORDER — CALCIUM GLUCONATE-NACL 1-0.675 GM/50ML-% IV SOLN
1.0000 g | Freq: Once | INTRAVENOUS | Status: AC
  Administered 2019-11-07: 18:00:00 1000 mg via INTRAVENOUS
  Filled 2019-11-07: qty 50

## 2019-11-07 MED ORDER — SODIUM CHLORIDE 0.9 % IV SOLN
100.0000 mg | Freq: Every day | INTRAVENOUS | Status: AC
  Administered 2019-11-08 – 2019-11-11 (×4): 100 mg via INTRAVENOUS
  Filled 2019-11-07 (×4): qty 100

## 2019-11-07 MED ORDER — SODIUM CHLORIDE 0.9 % IV BOLUS
1000.0000 mL | Freq: Once | INTRAVENOUS | Status: AC
  Administered 2019-11-07: 1000 mL via INTRAVENOUS

## 2019-11-07 MED ORDER — CALCIUM GLUCONATE 10 % IV SOLN: 1.0000 g | Freq: Once | INTRAVENOUS | Status: DC

## 2019-11-07 MED ORDER — VITAMIN D 25 MCG (1000 UNIT) PO TABS
1000.0000 [IU] | ORAL_TABLET | Freq: Every day | ORAL | Status: DC
  Administered 2019-11-08 – 2019-11-11 (×4): 1000 [IU] via ORAL
  Filled 2019-11-07 (×4): qty 1

## 2019-11-07 MED ORDER — SODIUM CHLORIDE 0.9 % IV SOLN
200.0000 mg | Freq: Once | INTRAVENOUS | Status: AC
  Administered 2019-11-07: 20:00:00 200 mg via INTRAVENOUS
  Filled 2019-11-07: qty 200

## 2019-11-07 NOTE — ED Triage Notes (Signed)
Pt states he tested positive for covid 12/1. Pt c/o increased weakness, N/V/D, loss of appetite, cough

## 2019-11-07 NOTE — ED Notes (Signed)
Pt given tooth brush per pt request.

## 2019-11-07 NOTE — Progress Notes (Signed)
Remdesivir - Pharmacy Brief Note  Patient COVID positive 11/30 per records in [facility] from [facility].   O:  ALT: 34 CXR: both lungs clear SpO2: 98% on 3L   A/P:  Remdesivir 200 mg IVPB once followed by 100 mg IVPB daily x 4 days.   Dorena Bodo, PharmD 11/07/2019 8:06 PM

## 2019-11-07 NOTE — H&P (Addendum)
[place] [facility] [place] NAME: Bobby Sampson    MR#:  [id number]  DATE OF BIRTH:  03-22-1961  DATE OF ADMISSION:  11/07/2019  PRIMARY CARE PHYSICIAN: [facility] [place]   REQUESTING/REFERRING PHYSICIAN: Arta Silence, MD  CHIEF COMPLAINT:   Chief Complaint  Patient presents with   Weakness    HISTORY OF PRESENT ILLNESS:  Derrico Dacruz  is a 58 y.o. African American male with a known history of type 2 diabetes mellitus, hypertension and peripheral neuropathy, as well as stage IIIa who presented, who presented to the emergency room with acute onset of worsening generalized weakness.  His wife who is a CNA apparently tested positive for COVID-19 and the patient got tested on 10/31/2019 and his test came back positive.  He stated that he started having dry cough without dyspnea or wheezing however with recurrent nausea and vomiting as well as diarrhea.  He admitted to intermittent fever and chills.  He stated that he has not eaten in 4 days and therefore has not taken any of his medications including his diabetes once.  He stated that his mouth tastes better.  He denied any chest pain however states that it feels like a weight on the chest.  He denied any palpitations.  No dysuria, oliguria or hematuria or flank pain.  Upon presentation to the emergency room, vital signs were within normal initially and later on the pulse oximetry has dropped to 85% on room air with respiratory rate of 21.  He required 3 L of O2 by nasal cannula to bring pulse oximetry to anywhere from 96 to 99%.  Labs revealed hyponatremia of 130 with hyperkalemia 5.5 and a BUN and creatinine of 58 and 3.97 compared to 33 and 1.8 on 07/27/2016.  Blood glucose was elevated at 447. Anion gap was 14.  GFR was down to 18 compared to 47 in 2017.  LDH came back slightly elevated at 209 and high-sensitivity troponin 32, ferritin 176 and CRP came back elevated at 5.5.  Lactic acid was 1.6 and procalcitonin  0.38.  CBC was unremarkable except for microcytosis.  Fibrinogen was 638 and fibrin derivatives D-dimer was 1195.  Urinalysis showed more than 500 glucose in 100 protein with specific gravity 1017.  Portable chest x-ray showed no active disease.  The patient was given IV remdesivir and Decadron as well as hydration with IV normal saline for 2 L bolus 8 units of IV NovoLog and 1 amp of calcium gluconate for hyperkalemia.  He will be admitted to a medical monitored isolation bed for further evaluation and management.  PAST MEDICAL HISTORY:   Past Medical History:  Diagnosis Date   Diabetes mellitus without complication ([place])    Hypertension    Neuropathy    Renal disorder     PAST SURGICAL HISTORY:   Past Surgical History:  Procedure Laterality Date   TONSILLECTOMY      SOCIAL HISTORY:   Social History   Tobacco Use   Smoking status: Never Smoker   Smokeless tobacco: Never Used  Substance Use Topics   Alcohol use: Not Currently    FAMILY HISTORY:  No family history on file.  DRUG ALLERGIES:  No Known Allergies  REVIEW OF SYSTEMS:   ROS As per history of present illness. All pertinent systems were reviewed above. Constitutional,  HEENT, cardiovascular, respiratory, GI, GU, musculoskeletal, neuro, psychiatric, endocrine,  integumentary and hematologic systems were reviewed and are otherwise  negative/unremarkable except for positive findings mentioned  above in the HPI.   MEDICATIONS AT HOME:   Prior to Admission medications   Medication Sig Start Date End Date Taking? Authorizing Provider  amLODipine (NORVASC) 10 MG tablet Take 10 mg by mouth daily.   Yes [provider]  atorvastatin (LIPITOR) 80 MG tablet Take 80 mg by mouth at bedtime.   Yes [provider]  Calcium Acetate 667 MG TABS Take 667 mg by mouth 3 (three) times daily with meals.   Yes [provider]  cholecalciferol (VITAMIN D) 25 MCG (1000 UT) tablet Take 2,000 Units  by mouth daily.   Yes [provider]  furosemide (LASIX) 40 MG tablet Take 40 mg by mouth daily.   Yes [provider]  Insulin Glargine (LANTUS SOLOSTAR) 100 UNIT/ML Solostar Pen Inject 30 Units into the skin every 12 (twelve) hours.   Yes [provider]  metoprolol tartrate (LOPRESSOR) 50 MG tablet Take 50 mg by mouth 2 (two) times daily.   Yes [provider]      VITAL SIGNS:  Blood pressure (!) 170/91, pulse 92, temperature 98.2 F (36.8 C), temperature source Oral, resp. rate 18, height 5\' 9"  (1.753 m), weight 99.8 kg, SpO2 96 %.  PHYSICAL EXAMINATION:  Physical Exam  GENERAL:  58 y.o.-year-old patient lying in the bed with mild respiratory distress with conversational dyspnea EYES: Pupils equal, round, reactive to light and accommodation. No scleral icterus. Extraocular muscles intact.  HEENT: Head atraumatic, normocephalic. Oropharynx and nasopharynx clear.  NECK:  Supple, no jugular venous distention. No thyroid enlargement, no tenderness.  LUNGS: Diminished bibasilar breath sounds with bibasal crackles. CARDIOVASCULAR: Regular rate and rhythm, S1, S2 normal. No murmurs, rubs, or gallops.  ABDOMEN: Soft, nondistended, nontender. Bowel sounds present. No organomegaly or mass.  EXTREMITIES: No pedal edema, cyanosis, or clubbing.  NEUROLOGIC: Cranial nerves II through XII are intact. Muscle strength 5/5 in all extremities. Sensation intact. Gait not checked.  PSYCHIATRIC: The patient is alert and oriented x 3.  Normal affect and good eye contact. Skin: He has a left plantar foot ulcer apparently at the site of a trimmed callus with no purulent drainage.     LABORATORY PANEL:   CBC Recent Labs  Lab 11/07/19 1237  WBC 6.1  HGB 14.3  HCT 45.7  PLT 185   ------------------------------------------------------------------------------------------------------------------  Chemistries  Recent Labs  Lab 11/07/19 1237  NA 130*  K 5.5*   CL 96*  CO2 20*  GLUCOSE 447*  BUN 58*  CREATININE 3.97*  CALCIUM 8.3*   ------------------------------------------------------------------------------------------------------------------  Cardiac Enzymes No results for input(s): TROPONINI in the last 168 hours. ------------------------------------------------------------------------------------------------------------------  RADIOLOGY:  Dg Chest Portable 1 View  Result Date: 11/07/2019 CLINICAL DATA:  Weakness. EXAM: PORTABLE CHEST 1 VIEW COMPARISON:  None. FINDINGS: The heart size and mediastinal contours are within normal limits. Both lungs are clear. No pneumothorax or pleural effusion is noted. The visualized skeletal structures are unremarkable. IMPRESSION: No active disease. Electronically Signed   By: Marijo Conception M.D.   On: 11/07/2019 16:21      IMPRESSION AND PLAN:   1.  Acute hypoxic respiratory failure secondary to pneumonia likely due to COVID-19.  The patient will be admitted to a medical monitored bed.  Will obtain blood and sputum cultures and place on IV Rocephin and Zithromax pending cultures results.  We will place the patient on IV remdesivir that was started in the ER as well as IV Decadron specially given elevated inflammatory markers.    I  believe that his chest heaviness is related to dyspnea.  We will also place the patient on IV heparin and obtain a chest CTA  only with improvement of creatinine with hydration.  I discussed convalescent plasma with the patient with benefits and risks and he desires to think about it and let us know later on. He will be placed on vitamin D3, aspirin, zinc and vitamin C. We will follow serial CBCs with differential as well as CMP and troponin I  2.  Uncontrolled type 2 diabetes mellitus.  The patient will be placed on frequent fingerstick blood glucose measures with resistant NovoLog protocol together with hydration with IV normal saline.  We will hold off Jardiance and continue  basal Lantus coverage.  3.  Hyperkalemia.  He will be given IV insulin for 1 dose and p.o. Kayexalate and 1 ampoule of sodium bicarbonate.  4.  Acute kidney injury on stage IIIa-IIIb chronic kidney disease.  The patient will be hydrated with IV normal saline and will follow his BMP.  5.  Hypertension.  We will continue Lopressor and amlodipine.  6.  Dyslipidemia.  We will continue statin therapy.  7.  GERD.  PPI therapy will be resumed.  8.  Left plantar foot ulcer.  Wound consult will be obtained.  9.  DVT prophylaxis.  The patient will be placed on IV heparin as mentioned above.  GI prophylaxis was addressed above with PPI n.p.o. H2 blocker especially given anticoagulation and steroid therapy.   All the records are reviewed and case discussed with ED provider. The plan of care was discussed in details with the patient (and family). I answered all questions. The patient agreed to proceed with the above mentioned plan. Further management will depend upon hospital course.   CODE STATUS: I discussed CODE STATUS with the patient and he desires to be full code.  TOTAL TIME TAKING CARE OF THIS PATIENT: 55 minutes.    Christel Mormon M.D on 11/07/2019 at 7:42 PM  [facility]   From 7 PM-7 AM, contact night-coverage [email or web address]  CC: Primary care physician; [place]   Note: This dictation was prepared with Dragon dictation along with smaller phrase technology. Any transcriptional errors that result from this process are unintentional.

## 2019-11-07 NOTE — ED Notes (Signed)
Pt SpO2 dropped to 85%, placed on 2L/min O2 via nasal cannula. SpO2 increased to 90%. O2 increased to 3L/min via nasal cannula, SpO2 increased to 97%

## 2019-11-07 NOTE — ED Notes (Signed)
Resting with eyes closed, resp unlabored, IVF infusing.

## 2019-11-07 NOTE — ED Notes (Signed)
Pt assisted with urinal. Urine specimen sent to lab at this time. Pt repositioned in bed. Admitting MD at bedside at this time. Pt given cup of water.

## 2019-11-07 NOTE — ED Notes (Signed)
Called pharmacy about calcium gluconate, per pharmacy they will send it up "in a couple minutes"

## 2019-11-07 NOTE — ED Notes (Signed)
Wife updated with pt consent

## 2019-11-07 NOTE — ED Provider Notes (Addendum)
[facility] Emergency Department Provider Note ____________________________________________   First MD Initiated Contact with Patient 11/07/19 1643     (approximate)  I have reviewed the triage vital signs and the nursing notes.   HISTORY  Chief Complaint Weakness    HPI Bobby Sampson is a 58 y.o. male with PMH as noted below who presents with generalized weakness, gradual onset over the last few days, associated with vomiting and diarrhea initially which has subsided, as well as with lightheadedness.  The patient denies any significant shortness of breath but has a mild cough.  He denies any chest pain.  He has had some subjective chills and fever.  He tested positive for COVID-19 6 days ago.  Past Medical History:  Diagnosis Date  . Diabetes mellitus without complication ([place])   . Hypertension   . Neuropathy   . Renal disorder     Patient Active Problem List   Diagnosis Date Noted  . Hypertension complicating diabetes ([place])   . Type 2 diabetes mellitus with foot ulcer ([place])   . AKI (acute kidney injury) ([place])   . COVID-19 11/07/2019    Past Surgical History:  Procedure Laterality Date  . TONSILLECTOMY      Prior to Admission medications   Medication Sig Start Date End Date Taking? Authorizing Provider  amLODipine (NORVASC) 10 MG tablet Take 10 mg by mouth daily.   Yes [provider]  aspirin EC 81 MG tablet Take 81 mg by mouth daily.   Yes [provider]  atorvastatin (LIPITOR) 80 MG tablet Take 80 mg by mouth at bedtime.   Yes [provider]  Calcium Acetate 667 MG TABS Take 667 mg by mouth 3 (three) times daily with meals.   Yes [provider]  cholecalciferol (VITAMIN D) 25 MCG (1000 UT) tablet Take 2,000 Units by mouth daily.   Yes [provider]  empagliflozin (JARDIANCE) 10 MG TABS tablet Take 10 mg by mouth daily.   Yes [provider]  furosemide (LASIX) 40 MG tablet Take 40 mg  by mouth daily.   Yes [provider]  Insulin Glargine (LANTUS SOLOSTAR) 100 UNIT/ML Solostar Pen Inject 30 Units into the skin every 12 (twelve) hours.   Yes [provider]  Melatonin 5 MG TABS Take 5 mg by mouth at bedtime as needed (sleep).   Yes [provider]  metoprolol tartrate (LOPRESSOR) 50 MG tablet Take 50 mg by mouth 2 (two) times daily.   Yes [provider]  Multiple Vitamin (MULTIVITAMIN WITH MINERALS) TABS tablet Take 1 tablet by mouth daily.   Yes [provider]  omeprazole (PRILOSEC OTC) 20 MG tablet Take 20 mg by mouth daily.   Yes [provider]    Allergies Trulicity [dulaglutide]  No family history on file.  Social History Social History   Tobacco Use  . Smoking status: Never Smoker  . Smokeless tobacco: Never Used  Substance Use Topics  . Alcohol use: Not Currently  . Drug use: Not Currently    Review of Systems  Constitutional: Positive for fever and weakness. Eyes: No redness. ENT: No sore throat. Cardiovascular: Denies chest pain. Respiratory: Denies shortness of breath. Gastrointestinal: Positive for resolving vomiting and diarrhea. Genitourinary: Negative for dysuria.  Musculoskeletal: Positive for body aches. Skin: Negative for rash. Neurological: Negative for headache.   ____________________________________________   PHYSICAL EXAM:  VITAL SIGNS: ED Triage Vitals  Enc Vitals Group     BP 11/07/19 1233 110/76  Pulse Rate 11/07/19 1233 96     Resp 11/07/19 1233 18     Temp 11/07/19 1233 98.1 F (36.7 C)     Temp Source 11/07/19 1233 Oral     SpO2 11/07/19 1233 97 %     Weight 11/07/19 1234 220 lb (99.8 kg)     Height 11/07/19 1234 5\' 9"  (1.753 m)     Head Circumference --      Peak Flow --      Pain Score 11/07/19 1234 4     Pain Loc --      Pain Edu? --      Excl. in [place]? --     Constitutional: Alert and oriented.  Somewhat weak and tired appearing but in no acute  distress. Eyes: Conjunctivae are normal.  Head: Atraumatic. Nose: No congestion/rhinnorhea. Mouth/Throat: Mucous membranes are dry.   Neck: Normal range of motion.  Cardiovascular: Normal rate, regular rhythm. Good peripheral circulation. Respiratory: Normal respiratory effort.  No retractions.  Gastrointestinal: No distention.  Musculoskeletal: No lower extremity edema.  Extremities warm and well perfused.  Neurologic:  Normal speech and language. No gross focal neurologic deficits are appreciated.  Skin:  Skin is warm and dry. No rash noted. Psychiatric: Mood and affect are normal. Speech and behavior are normal.  ____________________________________________   LABS (all labs ordered are listed, but only abnormal results are displayed)  Labs Reviewed  BASIC METABOLIC PANEL - Abnormal; Notable for the following components:      Result Value   Sodium 130 (*)    Potassium 5.5 (*)    Chloride 96 (*)    CO2 20 (*)    Glucose, Bld 447 (*)    BUN 58 (*)    Creatinine, Ser 3.97 (*)    Calcium 8.3 (*)    GFR calc non Af Amer 15 (*)    GFR calc Af Amer 18 (*)    All other components within normal limits  CBC - Abnormal; Notable for the following components:   RBC 6.03 (*)    MCV 75.8 (*)    MCH 23.7 (*)    All other components within normal limits  URINALYSIS, COMPLETE (UACMP) WITH MICROSCOPIC - Abnormal; Notable for the following components:   Color, Urine YELLOW (*)    APPearance CLEAR (*)    Glucose, UA >=500 (*)    Protein, ur 100 (*)    All other components within normal limits  GLUCOSE, CAPILLARY - Abnormal; Notable for the following components:   Glucose-Capillary 449 (*)    All other components within normal limits  LACTATE DEHYDROGENASE - Abnormal; Notable for the following components:   LDH 209 (*)    All other components within normal limits  TRIGLYCERIDES - Abnormal; Notable for the following components:   Triglycerides 198 (*)    All other components within  normal limits  C-REACTIVE PROTEIN - Abnormal; Notable for the following components:   CRP 5.5 (*)    All other components within normal limits  GLUCOSE, CAPILLARY - Abnormal; Notable for the following components:   Glucose-Capillary 365 (*)    All other components within normal limits  FIBRIN DERIVATIVES D-DIMER ([facility] ONLY) - Abnormal; Notable for the following components:   Fibrin derivatives D-dimer ([facility]) 1,195.14 (*)    All other components within normal limits  FIBRINOGEN - Abnormal; Notable for the following components:   Fibrinogen 638 (*)    All other components within normal limits  HEPATIC FUNCTION PANEL - Abnormal; Notable  for the following components:   Albumin 3.2 (*)    All other components within normal limits  APTT - Abnormal; Notable for the following components:   aPTT >160 (*)    All other components within normal limits  HEPARIN LEVEL (UNFRACTIONATED) - Abnormal; Notable for the following components:   Heparin Unfractionated 1.75 (*)    All other components within normal limits  CBC WITH DIFFERENTIAL/PLATELET - Abnormal; Notable for the following components:   MCV 77.5 (*)    MCH 24.3 (*)    All other components within normal limits  COMPREHENSIVE METABOLIC PANEL - Abnormal; Notable for the following components:   CO2 17 (*)    Glucose, Bld 407 (*)    BUN 61 (*)    Creatinine, Ser 3.59 (*)    Calcium 8.2 (*)    Albumin 3.0 (*)    GFR calc non Af Amer 17 (*)    GFR calc Af Amer 20 (*)    Anion gap 17 (*)    All other components within normal limits  GLUCOSE, CAPILLARY - Abnormal; Notable for the following components:   Glucose-Capillary 362 (*)    All other components within normal limits  HEMOGLOBIN A1C - Abnormal; Notable for the following components:   Hgb A1c MFr Bld 14.3 (*)    All other components within normal limits  HEPARIN LEVEL (UNFRACTIONATED) - Abnormal; Notable for the following components:   Heparin Unfractionated 1.51 (*)    All other  components within normal limits  GLUCOSE, CAPILLARY - Abnormal; Notable for the following components:   Glucose-Capillary 340 (*)    All other components within normal limits  HEPARIN LEVEL (UNFRACTIONATED) - Abnormal; Notable for the following components:   Heparin Unfractionated 0.14 (*)    All other components within normal limits  CBC WITH DIFFERENTIAL/PLATELET - Abnormal; Notable for the following components:   Hemoglobin 12.9 (*)    MCV 76.4 (*)    MCH 24.1 (*)    All other components within normal limits  COMPREHENSIVE METABOLIC PANEL - Abnormal; Notable for the following components:   Sodium 133 (*)    CO2 20 (*)    Glucose, Bld 348 (*)    BUN 68 (*)    Creatinine, Ser 2.74 (*)    Calcium 8.1 (*)    Albumin 2.8 (*)    GFR calc non Af Amer 24 (*)    GFR calc Af Amer 28 (*)    All other components within normal limits  GLUCOSE, CAPILLARY - Abnormal; Notable for the following components:   Glucose-Capillary 328 (*)    All other components within normal limits  HEPARIN LEVEL (UNFRACTIONATED) - Abnormal; Notable for the following components:   Heparin Unfractionated 1.04 (*)    All other components within normal limits  GLUCOSE, CAPILLARY - Abnormal; Notable for the following components:   Glucose-Capillary 361 (*)    All other components within normal limits  GLUCOSE, CAPILLARY - Abnormal; Notable for the following components:   Glucose-Capillary 361 (*)    All other components within normal limits  GLUCOSE, CAPILLARY - Abnormal; Notable for the following components:   Glucose-Capillary 307 (*)    All other components within normal limits  GLUCOSE, CAPILLARY - Abnormal; Notable for the following components:   Glucose-Capillary 333 (*)    All other components within normal limits  HEPARIN LEVEL (UNFRACTIONATED) - Abnormal; Notable for the following components:   Heparin Unfractionated 0.80 (*)    All other components within normal limits  GLUCOSE, CAPILLARY - Abnormal;  Notable for the following components:   Glucose-Capillary 329 (*)    All other components within normal limits  GLUCOSE, CAPILLARY - Abnormal; Notable for the following components:   Glucose-Capillary 290 (*)    All other components within normal limits  CBC WITH DIFFERENTIAL/PLATELET - Abnormal; Notable for the following components:   MCV 74.0 (*)    MCH 24.0 (*)    All other components within normal limits  COMPREHENSIVE METABOLIC PANEL - Abnormal; Notable for the following components:   CO2 21 (*)    Glucose, Bld 147 (*)    BUN 59 (*)    Creatinine, Ser 2.35 (*)    Calcium 8.5 (*)    Albumin 2.9 (*)    GFR calc non Af Amer 29 (*)    GFR calc Af Amer 34 (*)    All other components within normal limits  HEPARIN LEVEL (UNFRACTIONATED) - Abnormal; Notable for the following components:   Heparin Unfractionated 0.71 (*)    All other components within normal limits  GLUCOSE, CAPILLARY - Abnormal; Notable for the following components:   Glucose-Capillary 199 (*)    All other components within normal limits  GLUCOSE, CAPILLARY - Abnormal; Notable for the following components:   Glucose-Capillary 145 (*)    All other components within normal limits  GLUCOSE, CAPILLARY - Abnormal; Notable for the following components:   Glucose-Capillary 279 (*)    All other components within normal limits  HEPARIN LEVEL (UNFRACTIONATED) - Abnormal; Notable for the following components:   Heparin Unfractionated <0.10 (*)    All other components within normal limits  GLUCOSE, CAPILLARY - Abnormal; Notable for the following components:   Glucose-Capillary 141 (*)    All other components within normal limits  CBC WITH DIFFERENTIAL/PLATELET - Abnormal; Notable for the following components:   MCV 73.2 (*)    MCH 24.1 (*)    Abs Immature Granulocytes 0.09 (*)    All other components within normal limits  COMPREHENSIVE METABOLIC PANEL - Abnormal; Notable for the following components:   CO2 21 (*)     Glucose, Bld 156 (*)    BUN 54 (*)    Creatinine, Ser 2.17 (*)    Calcium 8.4 (*)    Albumin 2.9 (*)    GFR calc non Af Amer 32 (*)    GFR calc Af Amer 37 (*)    All other components within normal limits  HEPARIN LEVEL (UNFRACTIONATED) - Abnormal; Notable for the following components:   Heparin Unfractionated 0.77 (*)    All other components within normal limits  GLUCOSE, CAPILLARY - Abnormal; Notable for the following components:   Glucose-Capillary 156 (*)    All other components within normal limits  GLUCOSE, CAPILLARY - Abnormal; Notable for the following components:   Glucose-Capillary 140 (*)    All other components within normal limits  GLUCOSE, CAPILLARY - Abnormal; Notable for the following components:   Glucose-Capillary 227 (*)    All other components within normal limits  TROPONIN I (HIGH SENSITIVITY) - Abnormal; Notable for the following components:   Troponin I (High Sensitivity) 32 (*)    All other components within normal limits  TROPONIN I (HIGH SENSITIVITY) - Abnormal; Notable for the following components:   Troponin I (High Sensitivity) 35 (*)    All other components within normal limits  TROPONIN I (HIGH SENSITIVITY) - Abnormal; Notable for the following components:   Troponin I (High Sensitivity) 36 (*)    All other  components within normal limits  TROPONIN I (HIGH SENSITIVITY) - Abnormal; Notable for the following components:   Troponin I (High Sensitivity) 37 (*)    All other components within normal limits  LACTIC ACID, PLASMA  PROCALCITONIN  FERRITIN  PROTIME-INR  HEPARIN LEVEL (UNFRACTIONATED)  HEPARIN LEVEL (UNFRACTIONATED)  CBG MONITORING, ED   ____________________________________________  EKG  ED ECG REPORT I, Arta Silence, the attending physician, personally viewed and interpreted this ECG.  Date: 11/07/2019 EKG Time: 1240 Rate: 94 Rhythm: normal sinus rhythm QRS Axis: normal Intervals: normal ST/T Wave abnormalities: normal  Narrative Interpretation: no evidence of acute ischemia  ____________________________________________  RADIOLOGY  CXR: No focal infiltrate or other acute abnormality  ____________________________________________   PROCEDURES  Procedure(s) performed: No  Procedures  Critical Care performed: Yes  CRITICAL CARE Performed by: Arta Silence   Total critical care time: 35 minutes  Critical care time was exclusive of separately billable procedures and treating other patients.  Critical care was necessary to treat or prevent imminent or life-threatening deterioration.  Critical care was time spent personally by me on the following activities: development of treatment plan with patient and/or surrogate as well as nursing, discussions with consultants, evaluation of patient's response to treatment, examination of patient, obtaining history from patient or surrogate, ordering and performing treatments and interventions, ordering and review of laboratory studies, ordering and review of radiographic studies, pulse oximetry and re-evaluation of patient's condition. ____________________________________________   INITIAL IMPRESSION / ASSESSMENT AND PLAN / ED COURSE  Pertinent labs & imaging results that were available during my care of the patient were reviewed by me and considered in my medical decision making (see chart for details).  58 year old male with PMH as noted above including chronic kidney disease, hypertension and diabetes presents with worsening generalized weakness, and resolving GI symptoms after being diagnosed with COVID-19 a week ago.  I reviewed the past medical records in [place].  The patient had a positive test for COVID-19 obtained on 12/30.  The last lab work-up from the patient in our system is from several years ago, however he normally follows at the [place].  I contacted the [facility] emergency department and spoke to one of the physicians.  The  patient's most recent creatinine was 3.2 in September, and 2 in July.  On exam today the patient is tired but not acutely ill appearing.  His vital signs are normal.  O2 saturation is in the low 90s on room air and he has no increased work of breathing.  The remainder of the physical exam is unremarkable.  Initial labs obtained from triage reveal elevated creatinine of 3.97, which is somewhat worse than his baseline.  His potassium is also somewhat elevated at 5.5 but he has no peaked T waves or other EKG changes.  He has an elevated BUN and borderline low sodium and chloride consistent with dehydration and vomiting.  Overall presentation is consistent with symptoms related to COVID-19.  The patient clinically does not appear dehydrated.  I will give IV fluids as well as insulin due to the hyperkalemia and hyperglycemia, and calcium gluconate for the hyperkalemia.  Given that the patient at this time is not requiring supplemental oxygen, he possibly could be discharged home if after fluids his creatinine and electrolytes improve and he feels better.  ----------------------------------------- 7:33 PM on 11/07/2019 -----------------------------------------  The patient started to develop hypoxia with O2 saturation in the mid to high 80s with a good waveform.  He is now on 2  L by nasal cannula with an oxygen saturation in the mid 90s.  Chest x-ray shows no infiltrates.  He continues to appear somewhat tired and listless although he states he feels a bit better, and is altered.  Given the need for supplemental oxygen, and the acute on chronic renal insufficiency, I will admit him.  I discussed the case with Dr. Sidney Ace from the hospitalist service.  ______________________________  Lina Sayre was evaluated in Emergency Department on 11/21/2019 for the symptoms described in the history of present illness. He was evaluated in the context of the global COVID-19 pandemic, which necessitated consideration that  the patient might be at risk for infection with the SARS-CoV-2 virus that causes COVID-19. Institutional protocols and algorithms that pertain to the evaluation of patients at risk for COVID-19 are in a state of rapid change based on information released by regulatory bodies including the CDC and federal and state organizations. These policies and algorithms were followed during the patient's care in the ED. ____________________________________________   FINAL CLINICAL IMPRESSION(S) / ED DIAGNOSES  Final diagnoses:  COVID-19      NEW MEDICATIONS STARTED DURING THIS VISIT:  Discharge Medication List as of 11/11/2019 11:54 AM       Note:  This document was prepared using Dragon voice recognition software and may include unintentional dictation errors.   Arta Silence, MD 11/07/19 [id number]    Arta Silence, MD 11/21/19 1146

## 2019-11-08 LAB — HEMOGLOBIN A1C
Hgb A1c MFr Bld: 14.3 % — ABNORMAL HIGH (ref 4.8–5.6)
Mean Plasma Glucose: 363.71 mg/dL

## 2019-11-08 LAB — GLUCOSE, CAPILLARY
Glucose-Capillary: 362 mg/dL — ABNORMAL HIGH (ref 70–99)
Glucose-Capillary: 340 mg/dL — ABNORMAL HIGH (ref 70–99)
Glucose-Capillary: 328 mg/dL — ABNORMAL HIGH (ref 70–99)

## 2019-11-08 LAB — COMPREHENSIVE METABOLIC PANEL
ALT: 31 U/L (ref 0–44)
AST: 30 U/L (ref 15–41)
Albumin: 3 g/dL — ABNORMAL LOW (ref 3.5–5.0)
Alkaline Phosphatase: 104 U/L (ref 38–126)
Anion gap: 17 — ABNORMAL HIGH (ref 5–15)
BUN: 61 mg/dL — ABNORMAL HIGH (ref 6–20)
CO2: 17 mmol/L — ABNORMAL LOW (ref 22–32)
Calcium: 8.2 mg/dL — ABNORMAL LOW (ref 8.9–10.3)
Chloride: 102 mmol/L (ref 98–111)
Creatinine, Ser: 3.59 mg/dL — ABNORMAL HIGH (ref 0.61–1.24)
GFR calc Af Amer: 20 mL/min — ABNORMAL LOW (ref >60)
GFR calc non Af Amer: 17 mL/min — ABNORMAL LOW (ref >60)
Glucose, Bld: 407 mg/dL — ABNORMAL HIGH (ref 70–99)
Potassium: 5.1 mmol/L (ref 3.5–5.1)
Sodium: 136 mmol/L (ref 135–145)
Total Bilirubin: 0.8 mg/dL (ref 0.3–1.2)
Total Protein: 7.2 g/dL (ref 6.5–8.1)

## 2019-11-08 LAB — CBC WITH DIFFERENTIAL/PLATELET
Abs Immature Granulocytes: 0.03 10*3/uL (ref 0.00–0.07)
Basophils Absolute: 0 10*3/uL (ref 0.0–0.1)
Basophils Relative: 0 %
Eosinophils Absolute: 0 10*3/uL (ref 0.0–0.5)
Eosinophils Relative: 0 %
HCT: 43.4 % (ref 39.0–52.0)
Hemoglobin: 13.6 g/dL (ref 13.0–17.0)
Immature Granulocytes: 1 %
Lymphocytes Relative: 13 %
Lymphs Abs: 0.8 10*3/uL (ref 0.7–4.0)
MCH: 24.3 pg — ABNORMAL LOW (ref 26.0–34.0)
MCHC: 31.3 g/dL (ref 30.0–36.0)
MCV: 77.5 fL — ABNORMAL LOW (ref 80.0–100.0)
Monocytes Absolute: 0.5 10*3/uL (ref 0.1–1.0)
Monocytes Relative: 8 %
Neutro Abs: 5.1 10*3/uL (ref 1.7–7.7)
Neutrophils Relative %: 78 %
Platelets: 195 10*3/uL (ref 150–400)
RBC: 5.6 MIL/uL (ref 4.22–5.81)
RDW: 14.6 % (ref 11.5–15.5)
WBC: 6.5 10*3/uL (ref 4.0–10.5)
nRBC: 0 % (ref 0.0–0.2)

## 2019-11-08 LAB — APTT: aPTT: >160 seconds (ref 24–36)

## 2019-11-08 LAB — HEPARIN LEVEL (UNFRACTIONATED)
Heparin Unfractionated: 1.75 IU/mL — ABNORMAL HIGH (ref 0.30–0.70)
Heparin Unfractionated: 1.51 IU/mL — ABNORMAL HIGH (ref 0.30–0.70)
Heparin Unfractionated: 0.14 IU/mL — ABNORMAL LOW (ref 0.30–0.70)
Heparin Unfractionated: 0.36 IU/mL (ref 0.30–0.70)

## 2019-11-08 LAB — TROPONIN I (HIGH SENSITIVITY): Troponin I (High Sensitivity): 36 ng/L — ABNORMAL HIGH (ref <18)

## 2019-11-08 LAB — PROTIME-INR
INR: 1.1 (ref 0.8–1.2)
Prothrombin Time: 14.3 seconds (ref 11.4–15.2)

## 2019-11-08 MED ORDER — INSULIN ASPART 100 UNIT/ML ~~LOC~~ SOLN
0.0000 [IU] | Freq: Three times a day (TID) | SUBCUTANEOUS | Status: DC
  Administered 2019-11-08: 13:00:00 11 [IU] via SUBCUTANEOUS
  Filled 2019-11-08: qty 1

## 2019-11-08 MED ORDER — PANTOPRAZOLE SODIUM 20 MG PO TBEC
20.0000 mg | DELAYED_RELEASE_TABLET | Freq: Every day | ORAL | Status: DC
  Administered 2019-11-08 – 2019-11-11 (×4): 20 mg via ORAL
  Filled 2019-11-08 (×4): qty 1

## 2019-11-08 MED ORDER — HEPARIN (PORCINE) 25000 UT/250ML-% IV SOLN
1000.0000 [IU]/h | INTRAVENOUS | Status: DC
  Administered 2019-11-08: 13:00:00 1200 [IU]/h via INTRAVENOUS
  Administered 2019-11-09: 900 [IU]/h via INTRAVENOUS
  Filled 2019-11-08 (×3): qty 250

## 2019-11-08 MED ORDER — AMLODIPINE BESYLATE 10 MG PO TABS
10.0000 mg | ORAL_TABLET | Freq: Every day | ORAL | Status: DC
  Administered 2019-11-08 – 2019-11-11 (×4): 10 mg via ORAL
  Filled 2019-11-08 (×4): qty 1

## 2019-11-08 MED ORDER — ATORVASTATIN CALCIUM 20 MG PO TABS
80.0000 mg | ORAL_TABLET | Freq: Every day | ORAL | Status: DC
  Administered 2019-11-08 – 2019-11-10 (×3): 80 mg via ORAL
  Filled 2019-11-08 (×3): qty 4

## 2019-11-08 MED ORDER — INSULIN GLARGINE 100 UNIT/ML ~~LOC~~ SOLN
25.0000 [IU] | Freq: Two times a day (BID) | SUBCUTANEOUS | Status: DC
  Administered 2019-11-08 – 2019-11-09 (×2): 25 [IU] via SUBCUTANEOUS
  Filled 2019-11-08 (×4): qty 0.25

## 2019-11-08 MED ORDER — INSULIN ASPART 100 UNIT/ML ~~LOC~~ SOLN
6.0000 [IU] | Freq: Three times a day (TID) | SUBCUTANEOUS | Status: DC
  Administered 2019-11-08: 13:00:00 6 [IU] via SUBCUTANEOUS
  Filled 2019-11-08: qty 1

## 2019-11-08 MED ORDER — LABETALOL HCL 5 MG/ML IV SOLN
20.0000 mg | INTRAVENOUS | Status: DC | PRN
  Administered 2019-11-08 – 2019-11-10 (×4): 20 mg via INTRAVENOUS
  Filled 2019-11-08 (×4): qty 4

## 2019-11-08 MED ORDER — OMEPRAZOLE MAGNESIUM 20 MG PO TBEC: 20.0000 mg | DELAYED_RELEASE_TABLET | Freq: Every day | ORAL | Status: DC

## 2019-11-08 MED ORDER — INSULIN ASPART 100 UNIT/ML ~~LOC~~ SOLN
0.0000 [IU] | SUBCUTANEOUS | Status: DC
  Administered 2019-11-09: 13:00:00 11 [IU] via SUBCUTANEOUS
  Filled 2019-11-08 (×2): qty 1

## 2019-11-08 MED ORDER — MELATONIN 5 MG PO TABS
5.0000 mg | ORAL_TABLET | Freq: Every evening | ORAL | Status: DC | PRN
  Filled 2019-11-08: qty 1

## 2019-11-08 MED ORDER — METOPROLOL TARTRATE 50 MG PO TABS
50.0000 mg | ORAL_TABLET | Freq: Two times a day (BID) | ORAL | Status: DC
  Administered 2019-11-08 – 2019-11-11 (×7): 50 mg via ORAL
  Filled 2019-11-08 (×7): qty 1

## 2019-11-08 NOTE — Progress Notes (Signed)
ANTICOAGULATION CONSULT NOTE - Initial Consult  Pharmacy Consult for Heparin Indication: r/o PE  Allergies  Allergen Reactions  . Trulicity [Dulaglutide] Nausea And Vomiting    Patient Measurements: Height: 5\' 9"  (175.3 cm) Weight: 220 lb 0.3 oz (99.8 kg) IBW/kg (Calculated) : 70.7 HEPARIN DW (KG): 91.8  Vital Signs: Temp: 98.5 F (36.9 C) (12/08 1143) Temp Source: Oral (12/08 1143) BP: 164/84 (12/08 1143) Pulse Rate: 84 (12/08 1143)  Labs: Recent Labs    11/07/19 1237 11/07/19 1719 11/07/19 2254 11/08/19 0507 11/08/19 0917 11/08/19 1002  HGB 14.3  --   --   --  13.6  --   HCT 45.7  --   --   --  43.4  --   PLT 185  --   --   --  195  --   APTT  --   --   --  >160*  --   --   LABPROT  --   --   --  14.3  --   --   INR  --   --   --  1.1  --   --   HEPARINUNFRC  --   --   --   --  1.75* 1.51*  CREATININE 3.97*  --   --   --  3.59*  --   TROPONINIHS  --  32* 35*  --  36*  --     Estimated Creatinine Clearance: 25.5 mL/min (A) (by C-G formula based on SCr of 3.59 mg/dL (H)).   Medical History: Past Medical History:  Diagnosis Date  . Diabetes mellitus without complication ([place])   . Hypertension   . Neuropathy   . Renal disorder     Medications:  Medications Prior to Admission  Medication Sig Dispense Refill Last Dose  . amLODipine (NORVASC) 10 MG tablet Take 10 mg by mouth daily.   48+ hours at Unknown  . aspirin EC 81 MG tablet Take 81 mg by mouth daily.   48+ hours at Unknown  . atorvastatin (LIPITOR) 80 MG tablet Take 80 mg by mouth at bedtime.   48+ hours at Unknown  . Calcium Acetate 667 MG TABS Take 667 mg by mouth 3 (three) times daily with meals.   48+ hours at Unknown  . cholecalciferol (VITAMIN D) 25 MCG (1000 UT) tablet Take 2,000 Units by mouth daily.   48+ hours at Unknown  . empagliflozin (JARDIANCE) 10 MG TABS tablet Take 10 mg by mouth daily.   48+ hours at Unknown  . furosemide (LASIX) 40 MG tablet Take 40 mg by mouth daily.   48+ hours at  Unknown  . Insulin Glargine (LANTUS SOLOSTAR) 100 UNIT/ML Solostar Pen Inject 30 Units into the skin every 12 (twelve) hours.   48+ hours at Unknown  . Melatonin 5 MG TABS Take 5 mg by mouth at bedtime as needed (sleep).   Unknown at PRN  . metoprolol tartrate (LOPRESSOR) 50 MG tablet Take 50 mg by mouth 2 (two) times daily.   48+ hours at Unknown  . Multiple Vitamin (MULTIVITAMIN WITH MINERALS) TABS tablet Take 1 tablet by mouth daily.   48+ hours at Unknown  . omeprazole (PRILOSEC OTC) 20 MG tablet Take 20 mg by mouth daily.   48+ hours at Unknown    Assessment: Pharmacy asked to initiate and manage Heparin for possible PE.  Pt not on anticoagulants PTA per med list.  Baseline labs ordered.  Goal of Therapy:  Heparin level 0.3-0.7 units/ml Monitor  platelets by anticoagulation protocol: Yes   Plan:  12/8@0917 : HL 1.75, repeat 12/8@1002 : HL 1.51.  Spoke to nurse to hold heparin infusion for 2 hours and to restart infusion at 1,200 units/hr@1200 . Actual restart time documented was 1311.  Will recheck HL@2100  and continue to monitor H&H with AM labs.  Sonnie Bias A Dasean Brow 11/08/2019,1:47 PM

## 2019-11-08 NOTE — Progress Notes (Signed)
ANTICOAGULATION CONSULT NOTE - Initial Consult  Pharmacy Consult for Heparin Indication: r/o PE  Allergies  Allergen Reactions  . Trulicity [Dulaglutide] Nausea And Vomiting    Patient Measurements: Height: 5\' 9"  (175.3 cm) Weight: 220 lb (99.8 kg) IBW/kg (Calculated) : 70.7 HEPARIN DW (KG): 91.8  Vital Signs: Temp: 98.2 F (36.8 C) (12/07 1528) Temp Source: Oral (12/07 1528) BP: 109/68 (12/07 2130) Pulse Rate: 83 (12/07 2130)  Labs: Recent Labs    11/07/19 1237 11/07/19 1719 11/07/19 2254  HGB 14.3  --   --   HCT 45.7  --   --   PLT 185  --   --   CREATININE 3.97*  --   --   TROPONINIHS  --  32* 35*    Estimated Creatinine Clearance: 23 mL/min (A) (by C-G formula based on SCr of 3.97 mg/dL (H)).   Medical History: Past Medical History:  Diagnosis Date  . Diabetes mellitus without complication ([place])   . Hypertension   . Neuropathy   . Renal disorder     Medications:  (Not in a hospital admission)   Assessment: Pharmacy asked to initiate and manage Heparin for possible PE.  Pt not on anticoagulants PTA per med list.  Baseline labs ordered.  Goal of Therapy:  Heparin level 0.3-0.7 units/ml Monitor platelets by anticoagulation protocol: Yes   Plan:  Heparin bolus 5500 units x 1 then infusion at 1500 units/hr Check heparin level in 8 hours  [place] Crane, Dandra Shambaugh A 11/08/2019,12:15 AM

## 2019-11-08 NOTE — Progress Notes (Signed)
Inpatient Diabetes Program Recommendations  AACE/ADA: New Consensus Statement on Inpatient Glycemic Control (2015)  Target Ranges:  Prepandial:   less than 140 mg/dL      Peak postprandial:   less than 180 mg/dL (1-2 hours)      Critically ill patients:  140 - 180 mg/dL   Results for ASHE, KOPPER (MRN [id number]) as of 11/08/2019 07:59  Ref. Range 11/07/2019 12:35 11/07/2019 17:54  Glucose-Capillary Latest Ref Range: 70 - 99 mg/dL 449 (H) 365 (H)  8 units NOVOLOG  6 mg Decadron given at 7:43pm    Admit with: Acute hypoxic respiratory failure secondary to pneumonia likely due to COVID-19/ Hyperglycemia/ Hyperkalemia  History: DM, CKD  Home DM Meds: Lantus 30 units BID       Jardiance 10 mg Daily  Current Insulin Orders: None yet    PCP: [facility]  Note patient got Decadron last PM.  Down in the ED as of 8am today.     MD- Please consider the following:  1. Start Lantus 25 units BID (~80% total home dose)  2. Start Novolog Moderate Correction Scale/ SSI (0-15 units) Q4 hours      --Will follow patient during hospitalization--  Wyn Quaker RN, MSN, CDE Diabetes Coordinator Inpatient Glycemic Control Team Team Pager: [phone] (8a-5p)

## 2019-11-08 NOTE — Progress Notes (Signed)
Brief Hx:  a 58 y.o. African American male with a known history of type 2 diabetes mellitus, hypertension and peripheral neuropathy, as well as stage IIIa who presented, who presented to the emergency room with acute onset of worsening generalized weakness.  His wife who is a CNA apparently tested positive for COVID-19 and the patient got tested on 10/31/2019 and his test came back positive.  He stated that he started having dry cough without dyspnea or wheezing however with recurrent nausea and vomiting as well as diarrhea.  He admitted to intermittent fever and chills.  The patient was given IV remdesivir and Decadron as well as hydration with IV normal saline for 2 L bolus 8 units of IV NovoLog and 1 amp of calcium gluconate for hyperkalemia.  He will be admitted to a medical monitored isolation bed for further evaluation and management.  Subjective: Currently requiring 3 L of supplemental oxygen via nasal cannula to maintain saturation above 92%.  Spoke with patient over the phone.  He denies having any chest pain or increased shortness of breath.  He says he is feeling much better than when he came in.  Objective: Vital signs in last 24 hours: Temp:  [97.7 F (36.5 C)-98.5 F (36.9 C)] 98.5 F (36.9 C) (12/08 1143) Pulse Rate:  [74-97] 84 (12/08 1143) Resp:  [13-28] 16 (12/08 1143) BP: (109-172)/(68-91) 164/84 (12/08 1143) SpO2:  [85 %-99 %] 99 % (12/08 1143) Weight:  [99.8 kg] 99.8 kg (12/08 0405)  Intake/Output from previous day: 12/07 0701 - 12/08 0700 In: 2300  Out: -  Intake/Output this shift: Total I/O In: 173.2 [I.V.:173.2] Out: 1200 [Urine:1200]  PEx from 11/08/19 GENERAL:  mild respiratory distress  EYES: Pupils equal, round, reactive to light and accommodation. No scleral icterus. Extraocular muscles intact.  HEENT: Head atraumatic, normocephalic. Oropharynx and nasopharynx clear.  NECK:  Supple, no jugular venous distention. No thyroid enlargement, no tenderness.   LUNGS: Diminished bibasilar breath sounds with bibasal crackles. CARDIOVASCULAR: Regular rate and rhythm, S1, S2 normal. No murmurs, rubs, or gallops.  ABDOMEN: Soft, nondistended, nontender. Bowel sounds present. No organomegaly or mass.  EXTREMITIES: No pedal edema, cyanosis, or clubbing.  NEUROLOGIC: Cranial nerves II through XII are intact. Muscle strength 5/5 in all extremities. Sensation intact. Gait not checked.  PSYCHIATRIC: The patient is alert and oriented x 3.  Normal affect and good eye contact. Skin: He has a left plantar foot ulcer apparently at the site of a trimmed callus with no purulent drainage.  Results for orders placed or performed during the hospital encounter of 11/07/19 (from the past 24 hour(s))  Lactic acid, plasma     Status: None   Collection Time: 11/07/19  5:19 PM  Result Value Ref Range   Lactic Acid, Venous 1.6 0.5 - 1.9 mmol/L  Procalcitonin     Status: None   Collection Time: 11/07/19  5:19 PM  Result Value Ref Range   Procalcitonin 0.38 ng/mL  Lactate dehydrogenase     Status: Abnormal   Collection Time: 11/07/19  5:19 PM  Result Value Ref Range   LDH 209 (H) 98 - 192 U/L  Ferritin     Status: None   Collection Time: 11/07/19  5:19 PM  Result Value Ref Range   Ferritin 176 24 - 336 ng/mL  Triglycerides     Status: Abnormal   Collection Time: 11/07/19  5:19 PM  Result Value Ref Range   Triglycerides 198 (H) <150 mg/dL  C-reactive protein  Status: Abnormal   Collection Time: 11/07/19  5:19 PM  Result Value Ref Range   CRP 5.5 (H) <1.0 mg/dL  Troponin I (High Sensitivity)     Status: Abnormal   Collection Time: 11/07/19  5:19 PM  Result Value Ref Range   Troponin I (High Sensitivity) 32 (H) <18 ng/L  Hepatic function panel     Status: Abnormal   Collection Time: 11/07/19  5:19 PM  Result Value Ref Range   Total Protein 7.5 6.5 - 8.1 g/dL   Albumin 3.2 (L) 3.5 - 5.0 g/dL   AST 39 15 - 41 U/L   ALT 34 0 - 44 U/L   Alkaline Phosphatase  118 38 - 126 U/L   Total Bilirubin 0.4 0.3 - 1.2 mg/dL   Bilirubin, Direct <0.1 0.0 - 0.2 mg/dL   Indirect Bilirubin NOT CALCULATED 0.3 - 0.9 mg/dL  Glucose, capillary     Status: Abnormal   Collection Time: 11/07/19  5:54 PM  Result Value Ref Range   Glucose-Capillary 365 (H) 70 - 99 mg/dL  Fibrin derivatives D-Dimer ([facility] only)     Status: Abnormal   Collection Time: 11/07/19  6:36 PM  Result Value Ref Range   Fibrin derivatives D-dimer ([facility]) 1,195.14 (H) 0.00 - 499.00 ng/mL (FEU)  Fibrinogen     Status: Abnormal   Collection Time: 11/07/19  6:36 PM  Result Value Ref Range   Fibrinogen 638 (H) 210 - 475 mg/dL  Troponin I (High Sensitivity)     Status: Abnormal   Collection Time: 11/07/19 10:54 PM  Result Value Ref Range   Troponin I (High Sensitivity) 35 (H) <18 ng/L  apttadd     Status: Abnormal   Collection Time: 11/08/19  5:07 AM  Result Value Ref Range   aPTT >160 (HH) 24 - 36 seconds  protimeadd     Status: None   Collection Time: 11/08/19  5:07 AM  Result Value Ref Range   Prothrombin Time 14.3 11.4 - 15.2 seconds   INR 1.1 0.8 - 1.2  Glucose, capillary     Status: Abnormal   Collection Time: 11/08/19  8:40 AM  Result Value Ref Range   Glucose-Capillary 362 (H) 70 - 99 mg/dL   Comment 1 Notify RN   Heparin level (unfractionated)     Status: Abnormal   Collection Time: 11/08/19  9:17 AM  Result Value Ref Range   Heparin Unfractionated 1.75 (H) 0.30 - 0.70 IU/mL  Troponin I (High Sensitivity)     Status: Abnormal   Collection Time: 11/08/19  9:17 AM  Result Value Ref Range   Troponin I (High Sensitivity) 36 (H) <18 ng/L  CBC with Differential/Platelet     Status: Abnormal   Collection Time: 11/08/19  9:17 AM  Result Value Ref Range   WBC 6.5 4.0 - 10.5 K/uL   RBC 5.60 4.22 - 5.81 MIL/uL   Hemoglobin 13.6 13.0 - 17.0 g/dL   HCT 43.4 39.0 - 52.0 %   MCV 77.5 (L) 80.0 - 100.0 fL   MCH 24.3 (L) 26.0 - 34.0 pg   MCHC 31.3 30.0 - 36.0 g/dL   RDW 14.6 11.5 - 15.5 %    Platelets 195 150 - 400 K/uL   nRBC 0.0 0.0 - 0.2 %   Neutrophils Relative % 78 %   Neutro Abs 5.1 1.7 - 7.7 K/uL   Lymphocytes Relative 13 %   Lymphs Abs 0.8 0.7 - 4.0 K/uL   Monocytes Relative 8 %  Monocytes Absolute 0.5 0.1 - 1.0 K/uL   Eosinophils Relative 0 %   Eosinophils Absolute 0.0 0.0 - 0.5 K/uL   Basophils Relative 0 %   Basophils Absolute 0.0 0.0 - 0.1 K/uL   Immature Granulocytes 1 %   Abs Immature Granulocytes 0.03 0.00 - 0.07 K/uL  Comprehensive metabolic panel     Status: Abnormal   Collection Time: 11/08/19  9:17 AM  Result Value Ref Range   Sodium 136 135 - 145 mmol/L   Potassium 5.1 3.5 - 5.1 mmol/L   Chloride 102 98 - 111 mmol/L   CO2 17 (L) 22 - 32 mmol/L   Glucose, Bld 407 (H) 70 - 99 mg/dL   BUN 61 (H) 6 - 20 mg/dL   Creatinine, Ser 3.59 (H) 0.61 - 1.24 mg/dL   Calcium 8.2 (L) 8.9 - 10.3 mg/dL   Total Protein 7.2 6.5 - 8.1 g/dL   Albumin 3.0 (L) 3.5 - 5.0 g/dL   AST 30 15 - 41 U/L   ALT 31 0 - 44 U/L   Alkaline Phosphatase 104 38 - 126 U/L   Total Bilirubin 0.8 0.3 - 1.2 mg/dL   GFR calc non Af Amer 17 (L) >60 mL/min   GFR calc Af Amer 20 (L) >60 mL/min   Anion gap 17 (H) 5 - 15  Hemoglobin A1c     Status: Abnormal   Collection Time: 11/08/19  9:17 AM  Result Value Ref Range   Hgb A1c MFr Bld 14.3 (H) 4.8 - 5.6 %   Mean Plasma Glucose 363.71 mg/dL  Heparin level (unfractionated)     Status: Abnormal   Collection Time: 11/08/19 10:02 AM  Result Value Ref Range   Heparin Unfractionated 1.51 (H) 0.30 - 0.70 IU/mL  Glucose, capillary     Status: Abnormal   Collection Time: 11/08/19 11:39 AM  Result Value Ref Range   Glucose-Capillary 340 (H) 70 - 99 mg/dL   Comment 1 Notify RN     Studies/Results: Dg Chest Portable 1 View  Result Date: 11/07/2019 CLINICAL DATA:  Weakness. EXAM: PORTABLE CHEST 1 VIEW COMPARISON:  None. FINDINGS: The heart size and mediastinal contours are within normal limits. Both lungs are clear. No pneumothorax or pleural  effusion is noted. The visualized skeletal structures are unremarkable. IMPRESSION: No active disease. Electronically Signed   By: Marijo Conception M.D.   On: 11/07/2019 16:21    Scheduled Meds: . amLODipine  10 mg Oral Daily  . atorvastatin  80 mg Oral QHS  . cholecalciferol  1,000 Units Oral Daily  . insulin aspart  0-15 Units Subcutaneous Q4H  . insulin glargine  25 Units Subcutaneous BID  . metoprolol tartrate  50 mg Oral BID  . pantoprazole  20 mg Oral Daily  . sodium chloride flush  3 mL Intravenous Once   Continuous Infusions: . heparin 1,200 Units/hr (11/08/19 1311)  . remdesivir 100 mg in NS 100 mL 100 mg (11/08/19 1019)   PRN Meds:labetalol, Melatonin  Assessment/Plan: 1. Acute hypoxic respiratory failure secondary to pneumonia likely due to COVID-19.  - blood and sputum cultures  - Cont IV Rocephin and Zithromax pending cultures results.  - IV remdesivir that was started in the ER as well as IV Decadron specially given elevated inflammatory markers.  -From admission patient has been placed on IV heparin  therapy for suspected PE.  Plan was to do a chest CT angio however it cannot be done due to his kidney function still being far  from normal.  Due to receive heparin therapeutic dose. - discussed convalescent plasma with the patient with benefits and risks and he desires to think about it and let us know later on.  - Cont vitamin D3, aspirin, zinc and vitamin C.  - Follow serial CBCs with differential as well as CMP and troponin I  2.  Uncontrolled type 2 diabetes mellitus.  Fluctuating  - Frequent fingerstick blood glucose measures with resistant NovoLog protocol together with hydration with IV normal saline.   - hold off Jardiance and continue basal Lantus coverage.  3.  Hyperkalemia. S/P IV insulin for 1 dose and p.o. Kayexalate and 1 ampoule of sodium bicarbonate on admission  - Repeat K 5.1 - May give kayexalate - Monitor labs    4.  Acute kidney injury on  stage IIIa-IIIb chronic kidney disease.   - Repeat Cr 3.59. 3 yrs ago was around 1.5-1.8. May have a new baseline  - Cont IV normal saline and will follow his BMP  5.  Hypertension.  Stable  - We will continue Lopressor and amlodipine.  6.  Dyslipidemia.  We will continue statin therapy.  7.  GERD.  PPI therapy will be resumed.  8.  Left plantar foot ulcer.  No acute issues or drainage  - Wound care consulted; follow recs   9.  DVT prophylaxis.  The patient will be placed on IV heparin as mentioned above.  GI prophylaxis was addressed above with PPI n.p.o. H2 blocker especially given anticoagulation and steroid therapy.  CODE STATUS: full code    LOS: 1 day   Kashena Novitski Izetta Dakin

## 2019-11-08 NOTE — ED Notes (Signed)
ED TO INPATIENT HANDOFF REPORT  ED Nurse Name and Phone #: Willene Hatchet Name/Age/Gender Lina Sayre 58 y.o. male Room/Bed: ED13A/ED13A  Code Status   Code Status: Not on file  Home/SNF/Other Home Patient oriented to: self, place, time and situation Is this baseline? Yes   Triage Complete: Triage complete  Chief Complaint Covid positive  Triage Note Pt states he tested positive for covid 12/1. Pt c/o increased weakness, N/V/D, loss of appetite, cough    Allergies Allergies  Allergen Reactions  . Trulicity [Dulaglutide] Nausea And Vomiting    Level of Care/Admitting Diagnosis ED Disposition    ED Disposition Condition Comment   Admit  Hospital Area: [facility] [[id number]]  Level of Care: Med-Surg [16]  Covid Evaluation: Confirmed COVID Positive  Diagnosis: COVID-19 [id number]  Admitting Physician: Christel Mormon [id number]  Attending Physician: Christel Mormon [id number]  Estimated length of stay: 3 - 4 days  Certification:: I certify this patient will need inpatient services for at least 2 midnights  PT Class (Do Not Modify): Inpatient [101]  PT Acc Code (Do Not Modify): Private [1]       B Medical/Surgery History Past Medical History:  Diagnosis Date  . Diabetes mellitus without complication ([place])   . Hypertension   . Neuropathy   . Renal disorder    Past Surgical History:  Procedure Laterality Date  . TONSILLECTOMY       A IV Location/Drains/Wounds Patient Lines/Drains/Airways Status   Active Line/Drains/Airways    Name:   Placement date:   Placement time:   Site:   Days:   Peripheral IV 11/07/19 Left Antecubital   11/07/19    1650    Antecubital   1   Peripheral IV 11/08/19 Right Antecubital   11/08/19    0014    Antecubital   less than 1          Intake/Output Last 24 hours  Intake/Output Summary (Last 24 hours) at 11/08/2019 0329 Last data filed at 11/07/2019 2124 Gross per 24 hour  Intake 2300 ml  Output -  Net 2300 ml     Labs/Imaging Results for orders placed or performed during the hospital encounter of 11/07/19 (from the past 48 hour(s))  Glucose, capillary     Status: Abnormal   Collection Time: 11/07/19 12:35 PM  Result Value Ref Range   Glucose-Capillary 449 (H) 70 - 99 mg/dL  Basic metabolic panel     Status: Abnormal   Collection Time: 11/07/19 12:37 PM  Result Value Ref Range   Sodium 130 (L) 135 - 145 mmol/L   Potassium 5.5 (H) 3.5 - 5.1 mmol/L   Chloride 96 (L) 98 - 111 mmol/L   CO2 20 (L) 22 - 32 mmol/L   Glucose, Bld 447 (H) 70 - 99 mg/dL   BUN 58 (H) 6 - 20 mg/dL   Creatinine, Ser 3.97 (H) 0.61 - 1.24 mg/dL   Calcium 8.3 (L) 8.9 - 10.3 mg/dL   GFR calc non Af Amer 15 (L) >60 mL/min   GFR calc Af Amer 18 (L) >60 mL/min   Anion gap 14 5 - 15    Comment: Performed at [facility], [address] [place], [place] [address]  CBC     Status: Abnormal   Collection Time: 11/07/19 12:37 PM  Result Value Ref Range   WBC 6.1 4.0 - 10.5 K/uL   RBC 6.03 (H) 4.22 - 5.81 MIL/uL   Hemoglobin 14.3 13.0 - 17.0 g/dL  HCT 45.7 39.0 - 52.0 %   MCV 75.8 (L) 80.0 - 100.0 fL   MCH 23.7 (L) 26.0 - 34.0 pg   MCHC 31.3 30.0 - 36.0 g/dL   RDW 14.5 11.5 - 15.5 %   Platelets 185 150 - 400 K/uL   nRBC 0.0 0.0 - 0.2 %    Comment: Performed at [facility], [address] [place], [place] [address]  Urinalysis, Complete w Microscopic     Status: Abnormal   Collection Time: 11/07/19 12:37 PM  Result Value Ref Range   Color, Urine YELLOW (A) YELLOW   APPearance CLEAR (A) CLEAR   Specific Gravity, Urine 1.017 1.005 - 1.030   pH 5.0 5.0 - 8.0   Glucose, UA >=500 (A) NEGATIVE mg/dL   Hgb urine dipstick NEGATIVE NEGATIVE   Bilirubin Urine NEGATIVE NEGATIVE   Ketones, ur NEGATIVE NEGATIVE mg/dL   Protein, ur 100 (A) NEGATIVE mg/dL   Nitrite NEGATIVE NEGATIVE   Leukocytes,Ua NEGATIVE NEGATIVE   RBC / HPF 0-5 0 - 5 RBC/hpf   WBC, UA 0-5 0 - 5 WBC/hpf   Bacteria, UA NONE SEEN NONE  SEEN   Squamous Epithelial / LPF 0-5 0 - 5    Comment: Performed at [facility], [address] [place], [place] [address]  Lactic acid, plasma     Status: None   Collection Time: 11/07/19  5:19 PM  Result Value Ref Range   Lactic Acid, Venous 1.6 0.5 - 1.9 mmol/L    Comment: Performed at [facility], [address] [place], [place] [address]  Procalcitonin     Status: None   Collection Time: 11/07/19  5:19 PM  Result Value Ref Range   Procalcitonin 0.38 ng/mL    Comment:        Interpretation: PCT (Procalcitonin) <= 0.5 ng/mL: Systemic infection (sepsis) is not likely. Local bacterial infection is possible. (NOTE)       Sepsis PCT Algorithm           Lower Respiratory Tract                                      Infection PCT Algorithm    ----------------------------     ----------------------------         PCT < 0.25 ng/mL                PCT < 0.10 ng/mL         Strongly encourage             Strongly discourage   discontinuation of antibiotics    initiation of antibiotics    ----------------------------     -----------------------------       PCT 0.25 - 0.50 ng/mL            PCT 0.10 - 0.25 ng/mL               OR       >80% decrease in PCT            Discourage initiation of                                            antibiotics      Encourage discontinuation  of antibiotics    ----------------------------     -----------------------------         PCT >= 0.50 ng/mL              PCT 0.26 - 0.50 ng/mL               AND        <80% decrease in PCT             Encourage initiation of                                             antibiotics       Encourage continuation           of antibiotics    ----------------------------     -----------------------------        PCT >= 0.50 ng/mL                  PCT > 0.50 ng/mL               AND         increase in PCT                  Strongly encourage                                      initiation of  antibiotics    Strongly encourage escalation           of antibiotics                                     -----------------------------                                           PCT <= 0.25 ng/mL                                                 OR                                        > 80% decrease in PCT                                     Discontinue / Do not initiate                                             antibiotics Performed at [facility], [address] [place], [place] [address]   Lactate dehydrogenase     Status: Abnormal   Collection Time: 11/07/19  5:19 PM  Result Value Ref Range   LDH 209 (H) 98 - 192 U/L  Comment: Performed at [facility], [address] [place], [place] [address]  Ferritin     Status: None   Collection Time: 11/07/19  5:19 PM  Result Value Ref Range   Ferritin 176 24 - 336 ng/mL    Comment: Performed at [facility], [address] [place], [place] [address]  Triglycerides     Status: Abnormal   Collection Time: 11/07/19  5:19 PM  Result Value Ref Range   Triglycerides 198 (H) <150 mg/dL    Comment: Performed at [facility], [address] [place], [place] [address]  C-reactive protein     Status: Abnormal   Collection Time: 11/07/19  5:19 PM  Result Value Ref Range   CRP 5.5 (H) <1.0 mg/dL    Comment: Performed at [facility], [place] [address] [place], [place] [address]  Troponin I (High Sensitivity)     Status: Abnormal   Collection Time: 11/07/19  5:19 PM  Result Value Ref Range   Troponin I (High Sensitivity) 32 (H) <18 ng/L    Comment: (NOTE) Elevated high sensitivity troponin I (hsTnI) values and significant  changes across serial measurements may suggest ACS but many other  chronic and acute conditions are known to elevate hsTnI results.  Refer to the "Links" section for chest pain algorithms and additional  guidance. Performed at [facility], [address]  [place], [place] [address]   Hepatic function panel     Status: Abnormal   Collection Time: 11/07/19  5:19 PM  Result Value Ref Range   Total Protein 7.5 6.5 - 8.1 g/dL   Albumin 3.2 (L) 3.5 - 5.0 g/dL   AST 39 15 - 41 U/L   ALT 34 0 - 44 U/L   Alkaline Phosphatase 118 38 - 126 U/L   Total Bilirubin 0.4 0.3 - 1.2 mg/dL   Bilirubin, Direct <0.1 0.0 - 0.2 mg/dL   Indirect Bilirubin NOT CALCULATED 0.3 - 0.9 mg/dL    Comment: Performed at [facility], [address] [place], [place] [address]  Glucose, capillary     Status: Abnormal   Collection Time: 11/07/19  5:54 PM  Result Value Ref Range   Glucose-Capillary 365 (H) 70 - 99 mg/dL  Fibrin derivatives D-Dimer ([facility] only)     Status: Abnormal   Collection Time: 11/07/19  6:36 PM  Result Value Ref Range   Fibrin derivatives D-dimer ([facility]) 1,195.14 (H) 0.00 - 499.00 ng/mL (FEU)    Comment: (NOTE) <> Exclusion of Venous Thromboembolism (VTE) - OUTPATIENT ONLY   (Emergency Department or [place])   0-499 ng/ml (FEU): With a low to intermediate pretest probability                      for VTE this test result excludes the diagnosis                      of VTE.   >499 ng/ml (FEU) : VTE not excluded; additional work up for VTE is                      required. <> Testing on Inpatients and Evaluation of Disseminated Intravascular   Coagulation (DIC) Reference Range:   0-499 ng/ml (FEU) Performed at [facility], [address] [place], [place] [address]   Fibrinogen     Status: Abnormal   Collection Time: 11/07/19  6:36 PM  Result Value Ref Range  Fibrinogen 638 (H) 210 - 475 mg/dL    Comment: Performed at [facility], [place], [place] [address]  Troponin I (High Sensitivity)     Status: Abnormal   Collection Time: 11/07/19 10:54 PM  Result Value Ref Range   Troponin I (High Sensitivity) 35 (H) <18 ng/L    Comment: (NOTE) Elevated high sensitivity troponin I (hsTnI) values and significant   changes across serial measurements may suggest ACS but many other  chronic and acute conditions are known to elevate hsTnI results.  Refer to the "Links" section for chest pain algorithms and additional  guidance. Performed at [facility], [address], [place], [place] [address]    Dg Chest Portable 1 View  Result Date: 11/07/2019 CLINICAL DATA:  Weakness. EXAM: PORTABLE CHEST 1 VIEW COMPARISON:  None. FINDINGS: The heart size and mediastinal contours are within normal limits. Both lungs are clear. No pneumothorax or pleural effusion is noted. The visualized skeletal structures are unremarkable. IMPRESSION: No active disease. Electronically Signed   By: Marijo Conception M.D.   On: 11/07/2019 16:21    Pending Labs Unresulted Labs (From admission, onward)    Start     Ordered   11/08/19 0800  Heparin level (unfractionated)  Once-Timed,   STAT     11/08/19 0012   11/07/19 2310  apttadd  Add-on,   AD     11/07/19 2309   11/07/19 2310  protimeadd  Add-on,   AD     11/07/19 2309          Vitals/Pain Today's Vitals   11/07/19 2315 11/07/19 2330 11/08/19 0130 11/08/19 0200  BP:  (!) 150/87 128/74 (!) 153/85  Pulse: 78 80 74 84  Resp: 20 18 (!) 24 17  Temp:      TempSrc:      SpO2: 99% 97% 97% (!) 86%  Weight:      Height:      PainSc:        Isolation Precautions Airborne and Contact precautions  Medications Medications  sodium chloride flush (NS) 0.9 % injection 3 mL (3 mLs Intravenous Refused 11/07/19 1910)  remdesivir 200 mg in sodium chloride 0.9% 250 mL IVPB (0 mg Intravenous Stopped 11/07/19 2124)    Followed by  remdesivir 100 mg in sodium chloride 0.9 % 100 mL IVPB (has no administration in time range)  Vitamin D3 (Vitamin D) tablet 1,000 Units (has no administration in time range)  heparin bolus via infusion 5,500 Units (5,500 Units Intravenous Bolus from Bag 11/08/19 0001)    Followed by  heparin ADULT infusion 100 units/mL (25000 units/241mL sodium  chloride 0.45%) (1,500 Units/hr Intravenous New Bag/Given 11/08/19 0001)  sodium chloride 0.9 % bolus 1,000 mL (0 mLs Intravenous Stopped 11/07/19 1804)  insulin aspart (novoLOG) injection 8 Units (8 Units Intravenous Given 11/07/19 1707)  calcium gluconate 1 g/ 50 mL sodium chloride IVPB (0 g Intravenous Stopped 11/07/19 1909)  sodium chloride 0.9 % bolus 1,000 mL (0 mLs Intravenous Stopped 11/07/19 2002)  dexamethasone (DECADRON) injection 6 mg (6 mg Intravenous Given 11/07/19 1943)    Mobility walks Low fall risk   Focused Assessments Pulmonary Assessment Handoff:  Lung sounds:   O2 Device: Nasal Cannula O2 Flow Rate (L/min): 3 L/min      R Recommendations: See Admitting Provider Note  Report given to:   Additional Notes:

## 2019-11-08 NOTE — Progress Notes (Signed)
Ch spoke with pt via telephone. Pt is a 58 y.o. male that is being treated for COVID currently. Pt c/o having trouble breathing and struggled to hold a conversation with the Probation officer. Ch provided words of encouragement and asked if pt was able to contact his mother using the phone. Pt confirmed that he was. Pt has received calls from family but does not have the energy to talk on the phone. Ch will f/u at a later time.    11/08/19 1300  Clinical Encounter Type  Visited With Patient  Visit Type Social support  Referral From Family  Consult/Referral To Chaplain  Spiritual Encounters  Spiritual Needs Grief support;Emotional  Stress Factors  Patient Stress Factors Exhausted;Health changes;Major life changes  Family Stress Factors Exhausted;Health changes;Major life changes

## 2019-11-08 NOTE — Consult Note (Addendum)
[facility] Nurse Consult Note: Patient receiving care in [facility].  Consult completed remotely after review of record and photo of wound. Patient is COVID +. Reason for Consult: left foot ulcer Wound type: neuropathic, callus Pressure Injury POA: Yes Measurement: To be provided by the bedside RN in the flowsheet section Wound bed: dry callus Drainage (amount, consistency, odor) none Periwound: intact Dressing procedure/placement/frequency:  Apply iodine (swabstick or swab pad) to left foot callus. Allow to dry. Cover with a bandaid. Monitor the wound area(s) for worsening of condition such as: Signs/symptoms of infection,  Increase in size,  Development of or worsening of odor, Development of pain, or increased pain at the affected locations.  Notify the medical team if any of these develop.  Thank you for the consult. [place] nurse will not follow at this time.  Please re-consult the [place] team if needed.  Val Riles, RN, MSN, CWOCN, CNS-BC, pager [phone]

## 2019-11-08 NOTE — Progress Notes (Signed)
ANTICOAGULATION CONSULT NOTE - Initial Consult  Pharmacy Consult for Heparin Indication: r/o PE  Allergies  Allergen Reactions  . Trulicity [Dulaglutide] Nausea And Vomiting    Patient Measurements: Height: 5\' 9"  (175.3 cm) Weight: 220 lb 0.3 oz (99.8 kg) IBW/kg (Calculated) : 70.7 HEPARIN DW (KG): 91.8  Vital Signs: Temp: 98.2 F (36.8 C) (12/08 2006) Temp Source: Oral (12/08 2006) BP: 162/82 (12/08 2006) Pulse Rate: 77 (12/08 2006)  Labs: Recent Labs    11/07/19 1237 11/07/19 1719 11/07/19 2254 11/08/19 0507 11/08/19 0917 11/08/19 1002 11/08/19 2055  HGB 14.3  --   --   --  13.6  --   --   HCT 45.7  --   --   --  43.4  --   --   PLT 185  --   --   --  195  --   --   APTT  --   --   --  >160*  --   --   --   LABPROT  --   --   --  14.3  --   --   --   INR  --   --   --  1.1  --   --   --   HEPARINUNFRC  --   --   --   --  1.75* 1.51* 0.14*  CREATININE 3.97*  --   --   --  3.59*  --   --   TROPONINIHS  --  32* 35*  --  36*  --   --     Estimated Creatinine Clearance: 25.5 mL/min (A) (by C-G formula based on SCr of 3.59 mg/dL (H)).   Medical History: Past Medical History:  Diagnosis Date  . Diabetes mellitus without complication ([place])   . Hypertension   . Neuropathy   . Renal disorder     Medications:  Medications Prior to Admission  Medication Sig Dispense Refill Last Dose  . amLODipine (NORVASC) 10 MG tablet Take 10 mg by mouth daily.   48+ hours at Unknown  . aspirin EC 81 MG tablet Take 81 mg by mouth daily.   48+ hours at Unknown  . atorvastatin (LIPITOR) 80 MG tablet Take 80 mg by mouth at bedtime.   48+ hours at Unknown  . Calcium Acetate 667 MG TABS Take 667 mg by mouth 3 (three) times daily with meals.   48+ hours at Unknown  . cholecalciferol (VITAMIN D) 25 MCG (1000 UT) tablet Take 2,000 Units by mouth daily.   48+ hours at Unknown  . empagliflozin (JARDIANCE) 10 MG TABS tablet Take 10 mg by mouth daily.   48+ hours at Unknown  . furosemide  (LASIX) 40 MG tablet Take 40 mg by mouth daily.   48+ hours at Unknown  . Insulin Glargine (LANTUS SOLOSTAR) 100 UNIT/ML Solostar Pen Inject 30 Units into the skin every 12 (twelve) hours.   48+ hours at Unknown  . Melatonin 5 MG TABS Take 5 mg by mouth at bedtime as needed (sleep).   Unknown at PRN  . metoprolol tartrate (LOPRESSOR) 50 MG tablet Take 50 mg by mouth 2 (two) times daily.   48+ hours at Unknown  . Multiple Vitamin (MULTIVITAMIN WITH MINERALS) TABS tablet Take 1 tablet by mouth daily.   48+ hours at Unknown  . omeprazole (PRILOSEC OTC) 20 MG tablet Take 20 mg by mouth daily.   48+ hours at Unknown    Assessment: Pharmacy asked to initiate and manage  Heparin for possible PE.  Pt not on anticoagulants PTA per med list.  Baseline labs ordered.  Goal of Therapy:  Heparin level 0.3-0.7 units/ml Monitor platelets by anticoagulation protocol: Yes   Plan:  12/8@0917 : HL 1.75, repeat 12/8@1002 : HL 1.51.  Spoke to nurse to hold heparin infusion for 2 hours and to restart infusion at 1,200 units/hr@1200 . Actual restart time documented was 1311.  Will recheck HL@2100  and continue to monitor H&H with AM labs.  12/8:  HL @ 2055 = 0.14 Heparin level went from 1.5 on 1500 units/hr to 0.17 on 1200 unit/hr.   The 2055 HL appears to be some kind of error ; will order another HL for 2200 to confirm previous result.   Shanikqua Zarzycki D 11/08/2019,9:54 PM

## 2019-11-09 DIAGNOSIS — E1165 Type 2 diabetes mellitus with hyperglycemia: Secondary | ICD-10-CM

## 2019-11-09 DIAGNOSIS — Z794 Long term (current) use of insulin: Secondary | ICD-10-CM

## 2019-11-09 DIAGNOSIS — L97509 Non-pressure chronic ulcer of other part of unspecified foot with unspecified severity: Secondary | ICD-10-CM

## 2019-11-09 DIAGNOSIS — N179 Acute kidney failure, unspecified: Secondary | ICD-10-CM

## 2019-11-09 DIAGNOSIS — E11621 Type 2 diabetes mellitus with foot ulcer: Secondary | ICD-10-CM

## 2019-11-09 DIAGNOSIS — E1159 Type 2 diabetes mellitus with other circulatory complications: Secondary | ICD-10-CM

## 2019-11-09 DIAGNOSIS — I10 Essential (primary) hypertension: Secondary | ICD-10-CM

## 2019-11-09 LAB — COMPREHENSIVE METABOLIC PANEL
ALT: 26 U/L (ref 0–44)
AST: 23 U/L (ref 15–41)
Albumin: 2.8 g/dL — ABNORMAL LOW (ref 3.5–5.0)
Alkaline Phosphatase: 89 U/L (ref 38–126)
Anion gap: 11 (ref 5–15)
BUN: 68 mg/dL — ABNORMAL HIGH (ref 6–20)
CO2: 20 mmol/L — ABNORMAL LOW (ref 22–32)
Calcium: 8.1 mg/dL — ABNORMAL LOW (ref 8.9–10.3)
Chloride: 102 mmol/L (ref 98–111)
Creatinine, Ser: 2.74 mg/dL — ABNORMAL HIGH (ref 0.61–1.24)
GFR calc Af Amer: 28 mL/min — ABNORMAL LOW (ref >60)
GFR calc non Af Amer: 24 mL/min — ABNORMAL LOW (ref >60)
Glucose, Bld: 348 mg/dL — ABNORMAL HIGH (ref 70–99)
Potassium: 4.8 mmol/L (ref 3.5–5.1)
Sodium: 133 mmol/L — ABNORMAL LOW (ref 135–145)
Total Bilirubin: 0.6 mg/dL (ref 0.3–1.2)
Total Protein: 6.6 g/dL (ref 6.5–8.1)

## 2019-11-09 LAB — CBC WITH DIFFERENTIAL/PLATELET
Abs Immature Granulocytes: 0.03 10*3/uL (ref 0.00–0.07)
Basophils Absolute: 0 10*3/uL (ref 0.0–0.1)
Basophils Relative: 0 %
Eosinophils Absolute: 0 10*3/uL (ref 0.0–0.5)
Eosinophils Relative: 0 %
HCT: 40.9 % (ref 39.0–52.0)
Hemoglobin: 12.9 g/dL — ABNORMAL LOW (ref 13.0–17.0)
Immature Granulocytes: 1 %
Lymphocytes Relative: 22 %
Lymphs Abs: 1.3 10*3/uL (ref 0.7–4.0)
MCH: 24.1 pg — ABNORMAL LOW (ref 26.0–34.0)
MCHC: 31.5 g/dL (ref 30.0–36.0)
MCV: 76.4 fL — ABNORMAL LOW (ref 80.0–100.0)
Monocytes Absolute: 0.7 10*3/uL (ref 0.1–1.0)
Monocytes Relative: 12 %
Neutro Abs: 4 10*3/uL (ref 1.7–7.7)
Neutrophils Relative %: 65 %
Platelets: 211 10*3/uL (ref 150–400)
RBC: 5.35 MIL/uL (ref 4.22–5.81)
RDW: 14.6 % (ref 11.5–15.5)
WBC: 6.1 10*3/uL (ref 4.0–10.5)
nRBC: 0 % (ref 0.0–0.2)

## 2019-11-09 LAB — GLUCOSE, CAPILLARY
Glucose-Capillary: 199 mg/dL — ABNORMAL HIGH (ref 70–99)
Glucose-Capillary: 290 mg/dL — ABNORMAL HIGH (ref 70–99)
Glucose-Capillary: 307 mg/dL — ABNORMAL HIGH (ref 70–99)
Glucose-Capillary: 329 mg/dL — ABNORMAL HIGH (ref 70–99)
Glucose-Capillary: 333 mg/dL — ABNORMAL HIGH (ref 70–99)
Glucose-Capillary: 361 mg/dL — ABNORMAL HIGH (ref 70–99)
Glucose-Capillary: 361 mg/dL — ABNORMAL HIGH (ref 70–99)

## 2019-11-09 LAB — HEPARIN LEVEL (UNFRACTIONATED)
Heparin Unfractionated: 1.04 IU/mL — ABNORMAL HIGH (ref 0.30–0.70)
Heparin Unfractionated: 0.8 IU/mL — ABNORMAL HIGH (ref 0.30–0.70)

## 2019-11-09 LAB — TROPONIN I (HIGH SENSITIVITY): Troponin I (High Sensitivity): 37 ng/L — ABNORMAL HIGH (ref <18)

## 2019-11-09 MED ORDER — INSULIN ASPART 100 UNIT/ML ~~LOC~~ SOLN
0.0000 [IU] | Freq: Three times a day (TID) | SUBCUTANEOUS | Status: DC
  Administered 2019-11-10: 3 [IU] via SUBCUTANEOUS
  Administered 2019-11-10: 11 [IU] via SUBCUTANEOUS
  Administered 2019-11-10 – 2019-11-11 (×2): 3 [IU] via SUBCUTANEOUS
  Filled 2019-11-09 (×4): qty 1

## 2019-11-09 MED ORDER — INSULIN ASPART 100 UNIT/ML ~~LOC~~ SOLN: 0.0000 [IU] | Freq: Every day | SUBCUTANEOUS | Status: DC

## 2019-11-09 MED ORDER — INSULIN ASPART 100 UNIT/ML ~~LOC~~ SOLN
0.0000 [IU] | Freq: Three times a day (TID) | SUBCUTANEOUS | Status: DC
  Administered 2019-11-09: 18:00:00 8 [IU] via SUBCUTANEOUS
  Filled 2019-11-09: qty 1

## 2019-11-09 MED ORDER — INSULIN GLARGINE 100 UNIT/ML ~~LOC~~ SOLN
30.0000 [IU] | Freq: Two times a day (BID) | SUBCUTANEOUS | Status: DC
  Administered 2019-11-09 – 2019-11-11 (×4): 30 [IU] via SUBCUTANEOUS
  Filled 2019-11-09 (×5): qty 0.3

## 2019-11-09 MED ORDER — SODIUM CHLORIDE 0.9 % IV SOLN
INTRAVENOUS | Status: DC | PRN
  Administered 2019-11-09: 250 mL via INTRAVENOUS

## 2019-11-09 NOTE — Progress Notes (Signed)
Inpatient Diabetes Program Recommendations  AACE/ADA: New Consensus Statement on Inpatient Glycemic Control (2015)  Target Ranges:  Prepandial:   less than 140 mg/dL      Peak postprandial:   less than 180 mg/dL (1-2 hours)      Critically ill patients:  140 - 180 mg/dL   Lab Results  Component Value Date   GLUCAP 307 (H) 11/09/2019   HGBA1C 14.3 (H) 11/08/2019    Review of Glycemic Control  Admit with: Acute hypoxic respiratory failure secondary to pneumonia likely due to COVID-19/ Hyperglycemia/ Hyperkalemia  History: DM, CKD  Home DM Meds: Lantus 30 units BID                             Jardiance 10 mg Daily  Current Insulin Orders:Lantus 25 units bid + Novolog moderate correction q 4 hrs.  Inpatient Diabetes Program Recommendations:   CBGs elevated. -Increase Lantus to home dose of 30 units bid  Thank you, Nani Gasser. Arrow Emmerich, RN, MSN, CDE  Diabetes Coordinator Inpatient Glycemic Control Team Team Pager [phone] (8am-5pm) 11/09/2019 9:14 AM

## 2019-11-09 NOTE — Progress Notes (Signed)
[place] for Heparin Indication: r/o PE  Allergies  Allergen Reactions  . Trulicity [Dulaglutide] Nausea And Vomiting   Patient Measurements: Height: 5\' 9"  (175.3 cm) Weight: 220 lb 0.3 oz (99.8 kg) IBW/kg (Calculated) : 70.7 HEPARIN DW (KG): 91.8  Vital Signs: Temp: 98.2 F (36.8 C) (12/08 2006) Temp Source: Oral (12/08 2006) BP: 162/82 (12/08 2006) Pulse Rate: 77 (12/08 2006)  Labs: Recent Labs    11/07/19 1237 11/07/19 1719 11/07/19 2254 11/08/19 0507  11/08/19 0917 11/08/19 1002 11/08/19 2055 11/08/19 2237  HGB 14.3  --   --   --   --  13.6  --   --   --   HCT 45.7  --   --   --   --  43.4  --   --   --   PLT 185  --   --   --   --  195  --   --   --   APTT  --   --   --  >160*  --   --   --   --   --   LABPROT  --   --   --  14.3  --   --   --   --   --   INR  --   --   --  1.1  --   --   --   --   --   HEPARINUNFRC  --   --   --   --    < > 1.75* 1.51* 0.14* 0.36  CREATININE 3.97*  --   --   --   --  3.59*  --   --   --   TROPONINIHS  --  32* 35*  --   --  36*  --   --   --    < > = values in this interval not displayed.    Estimated Creatinine Clearance: 25.5 mL/min (A) (by C-G formula based on SCr of 3.59 mg/dL (H)).   Medical History: Past Medical History:  Diagnosis Date  . Diabetes mellitus without complication ([place])   . Hypertension   . Neuropathy   . Renal disorder     Medications:  Medications Prior to Admission  Medication Sig Dispense Refill Last Dose  . amLODipine (NORVASC) 10 MG tablet Take 10 mg by mouth daily.   48+ hours at Unknown  . aspirin EC 81 MG tablet Take 81 mg by mouth daily.   48+ hours at Unknown  . atorvastatin (LIPITOR) 80 MG tablet Take 80 mg by mouth at bedtime.   48+ hours at Unknown  . Calcium Acetate 667 MG TABS Take 667 mg by mouth 3 (three) times daily with meals.   48+ hours at Unknown  . cholecalciferol (VITAMIN D) 25 MCG (1000 UT) tablet Take 2,000 Units by mouth daily.    48+ hours at Unknown  . empagliflozin (JARDIANCE) 10 MG TABS tablet Take 10 mg by mouth daily.   48+ hours at Unknown  . furosemide (LASIX) 40 MG tablet Take 40 mg by mouth daily.   48+ hours at Unknown  . Insulin Glargine (LANTUS SOLOSTAR) 100 UNIT/ML Solostar Pen Inject 30 Units into the skin every 12 (twelve) hours.   48+ hours at Unknown  . Melatonin 5 MG TABS Take 5 mg by mouth at bedtime as needed (sleep).   Unknown at PRN  . metoprolol tartrate (LOPRESSOR) 50 MG tablet Take 50  mg by mouth 2 (two) times daily.   48+ hours at Unknown  . Multiple Vitamin (MULTIVITAMIN WITH MINERALS) TABS tablet Take 1 tablet by mouth daily.   48+ hours at Unknown  . omeprazole (PRILOSEC OTC) 20 MG tablet Take 20 mg by mouth daily.   48+ hours at Unknown    Assessment: Pharmacy asked to initiate and manage Heparin for possible PE.  Pt not on anticoagulants PTA per med list.  Baseline labs ordered.  Goal of Therapy:  Heparin level 0.3-0.7 units/ml Monitor platelets by anticoagulation protocol: Yes   Plan:  12/8@0917 : HL 1.75, repeat 12/8@1002 : HL 1.51.  Spoke to nurse to hold heparin infusion for 2 hours and to restart infusion at 1,200 units/hr@1200 . Actual restart time documented was 1311.  Will recheck HL@2100  and continue to monitor H&H with AM labs.  12/8:  HL @ 2055 = 0.14 Heparin level went from 1.5 on 1500 units/hr to 0.17 on 1200 unit/hr.   The 2055 HL appears to be some kind of error ; will order another HL for 2200 to confirm previous result.  12/8 @ 2237 HL = 0.36, therapeutic x 1.  Continue current rate and recheck in 8 hrs to confirm.  Hart Robinsons A 11/09/2019,12:08 AM

## 2019-11-09 NOTE — Progress Notes (Signed)
PROGRESS NOTE    Bobby Sampson  [id number] DOB: 30-Mar-1961 DOA: 11/07/2019 PCP: [place]   Brief Narrative:  58 y.o.African Landscape architect a known history of type 2 diabetes mellitus, hypertension and peripheral neuropathy, as well as stage IIIa who presented, who presented to the emergency room with acute onset of worsening generalized weakness. His wife who is a CNA apparently tested positive for COVID-19 and the patient got tested on 10/31/2019 and his test came back positive. He stated that he started having dry cough without dyspnea or wheezing however with recurrent nausea and vomiting as well as diarrhea. He admitted to intermittent fever and chills.  The patient was given IV remdesivir and Decadron as well as hydration with IV normal saline for 2 L bolus 8 units of IV NovoLog and 1 amp of calcium gluconate for hyperkalemia.   Subjective: Patient was feeling better when seen this morning.  Having some shortness of breath with ambulation but comfortable at rest.  He had BM after 5 days of constipation and feeling relieved.  Assessment & Plan:   Active Problems:   COVID-19  Acute hypoxic respiratory failure secondary to pneumonia likely due to COVID-19.   Patient has elevated inflammatory markers and D-dimer. He was started on Decadron and remdesivir. He was also started on IV Heparin for PE susceptibility.  His Geneva score was 0 and Wells criteria for PE was 3 if PE is the #1 diagnosis.  D-dimer elevated above thousand. -CTA cannot be done because of AKI with CKD. -VQ scan cannot be done as patient is covert positive and that scan required prolonged exposure. -Continue IV heparin for now. -Continue Decadron and remdesivir. -Continue vitamin C, zinc, aspirin and vitamin D. -Patient does not want convalescent plasma treatment.  Elevated troponin.  Mildly elevated troponin, staying flat, most likely secondary to demand ischemia.  No chest pain.   Uncontrolled diabetes type 2.  A1c 14.3.  CBG remained elevated as patient is on steroids now. -Increase Lantus to 30 units twice daily. -Increase sliding scale to resistant.  Hyperkalemia.  Resolved.  AKI with CKD stage IIIa-IIIb.  Unknown recent baseline.  Creatinine was 1.8 in 2017.  On presentation it was 3.9 which has improved to 2.74 today. -Continue to monitor. -Avoid nephrotoxic.  Hypertension. -Continue home dose of Lopressor and amlodipine.  Dyslipidemia. -Continue home dose of statin.  GERD.  Continue PPI  Left plantar foot ulcer.  No sign of infection. -Continue with wound care.  Objective: Vitals:   11/08/19 1143 11/08/19 2006 11/09/19 0523 11/09/19 1250  BP: (!) 164/84 (!) 162/82 (!) 152/80 117/71  Pulse: 84 77 77 70  Resp: 16 (!) 24 20 16   Temp: 98.5 F (36.9 C) 98.2 F (36.8 C) 97.6 F (36.4 C) 97.8 F (36.6 C)  TempSrc: Oral Oral Oral Oral  SpO2: 99% 100% 96% 100%  Weight:      Height:        Intake/Output Summary (Last 24 hours) at 11/09/2019 1541 Last data filed at 11/09/2019 1031 Gross per 24 hour  Intake 718.38 ml  Output 2075 ml  Net -1356.62 ml   Filed Weights   11/07/19 1234 11/08/19 0405  Weight: 99.8 kg 99.8 kg    Examination:  General exam: Appears calm and comfortable  Respiratory system: Clear to auscultation. Respiratory effort normal. Cardiovascular system: S1 & S2 heard, RRR. No JVD, murmurs, rubs, gallops or clicks. No pedal edema. Gastrointestinal system: Abdomen is nondistended, soft and nontender. No organomegaly or masses  felt. Normal bowel sounds heard. Central nervous system: Alert and oriented. No focal neurological deficits. Extremities: Symmetric 5 x 5 power. Psychiatry: Judgement and insight appear normal. Mood & affect appropriate.    DVT prophylaxis: Heparin Code Status: Full Family Communication: No family at bedside Disposition Plan: Pending improvement  Consultants:    Procedures:  Antimicrobials:    Data Reviewed: I have personally reviewed following labs and imaging studies  CBC: Recent Labs  Lab 11/07/19 1237 11/08/19 0917 11/09/19 0632  WBC 6.1 6.5 6.1  NEUTROABS  --  5.1 4.0  HGB 14.3 13.6 12.9*  HCT 45.7 43.4 40.9  MCV 75.8* 77.5* 76.4*  PLT 185 195 [id number]   Basic Metabolic Panel: Recent Labs  Lab 11/07/19 1237 11/08/19 0917 11/09/19 0632  NA 130* 136 133*  K 5.5* 5.1 4.8  CL 96* 102 102  CO2 20* 17* 20*  GLUCOSE 447* 407* 348*  BUN 58* 61* 68*  CREATININE 3.97* 3.59* 2.74*  CALCIUM 8.3* 8.2* 8.1*   GFR: Estimated Creatinine Clearance: 33.4 mL/min (A) (by C-G formula based on SCr of 2.74 mg/dL (H)). Liver Function Tests: Recent Labs  Lab 11/07/19 1719 11/08/19 0917 11/09/19 0632  AST 39 30 23  ALT 34 31 26  ALKPHOS 118 104 89  BILITOT 0.4 0.8 0.6  PROT 7.5 7.2 6.6  ALBUMIN 3.2* 3.0* 2.8*   No results for input(s): LIPASE, AMYLASE in the last 168 hours. No results for input(s): AMMONIA in the last 168 hours. Coagulation Profile: Recent Labs  Lab 11/08/19 0507  INR 1.1   Cardiac Enzymes: No results for input(s): CKTOTAL, CKMB, CKMBINDEX, TROPONINI in the last 168 hours. BNP (last 3 results) No results for input(s): PROBNP in the last 8760 hours. HbA1C: Recent Labs    11/08/19 0917  HGBA1C 14.3*   CBG: Recent Labs  Lab 11/08/19 2002 11/09/19 0015 11/09/19 0513 11/09/19 0808 11/09/19 1246  GLUCAP 361* 361* 333* 307* 329*   Lipid Profile: Recent Labs    11/07/19 1719  TRIG 198*   Thyroid Function Tests: No results for input(s): TSH, T4TOTAL, FREET4, T3FREE, THYROIDAB in the last 72 hours. Anemia Panel: Recent Labs    11/07/19 1719  FERRITIN 176   Sepsis Labs: Recent Labs  Lab 11/07/19 1719  PROCALCITON 0.38  LATICACIDVEN 1.6    No results found for this or any previous visit (from the past 240 hour(s)).   Radiology Studies: Dg Chest Portable 1 View  Result Date: 11/07/2019 CLINICAL DATA:  Weakness. EXAM: PORTABLE  CHEST 1 VIEW COMPARISON:  None. FINDINGS: The heart size and mediastinal contours are within normal limits. Both lungs are clear. No pneumothorax or pleural effusion is noted. The visualized skeletal structures are unremarkable. IMPRESSION: No active disease. Electronically Signed   By: Marijo Conception M.D.   On: 11/07/2019 16:21    Scheduled Meds: . amLODipine  10 mg Oral Daily  . atorvastatin  80 mg Oral QHS  . cholecalciferol  1,000 Units Oral Daily  . insulin aspart  0-15 Units Subcutaneous Q4H  . insulin glargine  30 Units Subcutaneous BID  . metoprolol tartrate  50 mg Oral BID  . pantoprazole  20 mg Oral Daily  . sodium chloride flush  3 mL Intravenous Once   Continuous Infusions: . sodium chloride Stopped (11/09/19 1029)  . heparin 1,000 Units/hr (11/09/19 1030)  . remdesivir 100 mg in NS 100 mL Stopped (11/09/19 0932)     LOS: 2 days   Time spent: 45 minutes.  I personally reviewed his chart.  Lorella Nimrod, MD [facility] Pager [phone]  If 7PM-7AM, please contact night-coverage [email or web address] Password [facility] 11/09/2019, 3:41 PM   This record has been created using Dragon voice recognition software. Errors have been sought and corrected,but may not always be located. Such creation errors do not reflect on the standard of care.

## 2019-11-09 NOTE — Progress Notes (Addendum)
[place] for Heparin Indication: r/o PE  Allergies  Allergen Reactions  . Trulicity [Dulaglutide] Nausea And Vomiting   Patient Measurements: Height: 5\' 9"  (175.3 cm) Weight: 220 lb 0.3 oz (99.8 kg) IBW/kg (Calculated) : 70.7 HEPARIN DW (KG): 91.8  Vital Signs: Temp: 97.6 F (36.4 C) (12/09 0523) Temp Source: Oral (12/09 0523) BP: 152/80 (12/09 0523) Pulse Rate: 77 (12/09 0523)  Labs: Recent Labs    11/07/19 1237  11/07/19 2254 11/08/19 0507 11/08/19 0917  11/08/19 2055 11/08/19 2237 11/09/19 0632  HGB 14.3  --   --   --  13.6  --   --   --  12.9*  HCT 45.7  --   --   --  43.4  --   --   --  40.9  PLT 185  --   --   --  195  --   --   --  211  APTT  --   --   --  >160*  --   --   --   --   --   LABPROT  --   --   --  14.3  --   --   --   --   --   INR  --   --   --  1.1  --   --   --   --   --   HEPARINUNFRC  --   --   --   --  1.75*   < > 0.14* 0.36 1.04*  CREATININE 3.97*  --   --   --  3.59*  --   --   --  2.74*  TROPONINIHS  --    < > 35*  --  36*  --   --   --  37*   < > = values in this interval not displayed.    Estimated Creatinine Clearance: 33.4 mL/min (A) (by C-G formula based on SCr of 2.74 mg/dL (H)).   Medical History: Past Medical History:  Diagnosis Date  . Diabetes mellitus without complication ([place])   . Hypertension   . Neuropathy   . Renal disorder     Medications:  Medications Prior to Admission  Medication Sig Dispense Refill Last Dose  . amLODipine (NORVASC) 10 MG tablet Take 10 mg by mouth daily.   48+ hours at Unknown  . aspirin EC 81 MG tablet Take 81 mg by mouth daily.   48+ hours at Unknown  . atorvastatin (LIPITOR) 80 MG tablet Take 80 mg by mouth at bedtime.   48+ hours at Unknown  . Calcium Acetate 667 MG TABS Take 667 mg by mouth 3 (three) times daily with meals.   48+ hours at Unknown  . cholecalciferol (VITAMIN D) 25 MCG (1000 UT) tablet Take 2,000 Units by mouth daily.   48+ hours  at Unknown  . empagliflozin (JARDIANCE) 10 MG TABS tablet Take 10 mg by mouth daily.   48+ hours at Unknown  . furosemide (LASIX) 40 MG tablet Take 40 mg by mouth daily.   48+ hours at Unknown  . Insulin Glargine (LANTUS SOLOSTAR) 100 UNIT/ML Solostar Pen Inject 30 Units into the skin every 12 (twelve) hours.   48+ hours at Unknown  . Melatonin 5 MG TABS Take 5 mg by mouth at bedtime as needed (sleep).   Unknown at PRN  . metoprolol tartrate (LOPRESSOR) 50 MG tablet Take 50 mg by mouth 2 (two) times daily.  48+ hours at Unknown  . Multiple Vitamin (MULTIVITAMIN WITH MINERALS) TABS tablet Take 1 tablet by mouth daily.   48+ hours at Unknown  . omeprazole (PRILOSEC OTC) 20 MG tablet Take 20 mg by mouth daily.   48+ hours at Unknown    Assessment: Pharmacy asked to initiate and manage Heparin for possible PE.  Pt not on anticoagulants PTA per med list.  Baseline labs ordered.  12/8@0917 : HL 1.75, repeat 12/8@1002 : HL 1.51.  Spoke to nurse to hold heparin infusion for 2 hours and to restart infusion at 1,200 units/hr@1200 . Actual restart time documented was 1311. 12/8@2055  HL = 0.17 went from 1.5 on 1500 units/hr to 0.17 on 1200 unit/hr.   The 2055 HL appears to be some kind of error ; will order another HL for 2200 to confirm previous result.  12/8 @ 2237 HL = 0.36, therapeutic x 1.  Continue current rate and recheck in 8 hrs to confirm.   Goal of Therapy:  Heparin level 0.3-0.7 units/ml Monitor platelets by anticoagulation protocol: Yes   Plan:  12/9@0632  HL 1.04. Spoke to nurse and instructed her to hold heparin infusion for 1 hour. Decreased rate to 1000 units/hr. Restarted@1030 (actual total stop time: 1 hour 40 minutes). Will recheck level in 6 hours.  Milli Woolridge A Nalaysia Manganiello 11/09/2019,11:06 AM

## 2019-11-09 NOTE — Progress Notes (Signed)
[place] for Heparin Indication: r/o PE  Allergies  Allergen Reactions  . Trulicity [Dulaglutide] Nausea And Vomiting   Patient Measurements: Height: 5\' 9"  (175.3 cm) Weight: 220 lb 0.3 oz (99.8 kg) IBW/kg (Calculated) : 70.7 HEPARIN DW (KG): 91.8  Vital Signs: Temp: 97.8 F (36.6 C) (12/09 1250) Temp Source: Oral (12/09 1250) BP: 117/71 (12/09 1250) Pulse Rate: 70 (12/09 1250)  Labs: Recent Labs    11/07/19 1237  11/07/19 2254 11/08/19 0507 11/08/19 0917  11/08/19 2237 11/09/19 0632 11/09/19 1625  HGB 14.3  --   --   --  13.6  --   --  12.9*  --   HCT 45.7  --   --   --  43.4  --   --  40.9  --   PLT 185  --   --   --  195  --   --  211  --   APTT  --   --   --  >160*  --   --   --   --   --   LABPROT  --   --   --  14.3  --   --   --   --   --   INR  --   --   --  1.1  --   --   --   --   --   HEPARINUNFRC  --   --   --   --  1.75*   < > 0.36 1.04* 0.80*  CREATININE 3.97*  --   --   --  3.59*  --   --  2.74*  --   TROPONINIHS  --    < > 35*  --  36*  --   --  37*  --    < > = values in this interval not displayed.    Estimated Creatinine Clearance: 33.4 mL/min (A) (by C-G formula based on SCr of 2.74 mg/dL (H)).   Medical History: Past Medical History:  Diagnosis Date  . Diabetes mellitus without complication ([place])   . Hypertension   . Neuropathy   . Renal disorder     Medications:  Medications Prior to Admission  Medication Sig Dispense Refill Last Dose  . amLODipine (NORVASC) 10 MG tablet Take 10 mg by mouth daily.   48+ hours at Unknown  . aspirin EC 81 MG tablet Take 81 mg by mouth daily.   48+ hours at Unknown  . atorvastatin (LIPITOR) 80 MG tablet Take 80 mg by mouth at bedtime.   48+ hours at Unknown  . Calcium Acetate 667 MG TABS Take 667 mg by mouth 3 (three) times daily with meals.   48+ hours at Unknown  . cholecalciferol (VITAMIN D) 25 MCG (1000 UT) tablet Take 2,000 Units by mouth daily.   48+ hours  at Unknown  . empagliflozin (JARDIANCE) 10 MG TABS tablet Take 10 mg by mouth daily.   48+ hours at Unknown  . furosemide (LASIX) 40 MG tablet Take 40 mg by mouth daily.   48+ hours at Unknown  . Insulin Glargine (LANTUS SOLOSTAR) 100 UNIT/ML Solostar Pen Inject 30 Units into the skin every 12 (twelve) hours.   48+ hours at Unknown  . Melatonin 5 MG TABS Take 5 mg by mouth at bedtime as needed (sleep).   Unknown at PRN  . metoprolol tartrate (LOPRESSOR) 50 MG tablet Take 50 mg by mouth 2 (two) times daily.  48+ hours at Unknown  . Multiple Vitamin (MULTIVITAMIN WITH MINERALS) TABS tablet Take 1 tablet by mouth daily.   48+ hours at Unknown  . omeprazole (PRILOSEC OTC) 20 MG tablet Take 20 mg by mouth daily.   48+ hours at Unknown    Assessment: Pharmacy asked to initiate and manage Heparin for possible PE.  Pt not on anticoagulants PTA per med list.  Baseline labs ordered.  12/8@0917 : HL 1.75, repeat 12/8@1002 : HL 1.51.  Spoke to nurse to hold heparin infusion for 2 hours and to restart infusion at 1,200 units/hr@1200 . Actual restart time documented was 1311. 12/8@2055  HL = 0.17 went from 1.5 on 1500 units/hr to 0.17 on 1200 unit/hr.   The 2055 HL appears to be some kind of error ; will order another HL for 2200 to confirm previous result.  12/8 @ 2237 HL = 0.36, therapeutic x 1.  Continue current rate and recheck in 8 hrs to confirm. 12/9@0632  HL 1.04 hold heparin infusion for 1 hour. Decreased rate to 1000 units/hr. Restarted@1030 (actual total stop time: 1 hour 40 minutes)   Goal of Therapy:  Heparin level 0.3-0.7 units/ml Monitor platelets by anticoagulation protocol: Yes   Plan:  12/9 1625 HL 0.8 supratherapeutic. Will decrease heparin drip to 900 units/hr. HL 12/10 at 0000. CBC daily while on heparin drip.  Tawnya Crook, PharmD 11/09/2019,5:05 PM

## 2019-11-10 LAB — CBC WITH DIFFERENTIAL/PLATELET
Abs Immature Granulocytes: 0.03 10*3/uL (ref 0.00–0.07)
Basophils Absolute: 0 10*3/uL (ref 0.0–0.1)
Basophils Relative: 0 %
Eosinophils Absolute: 0.1 10*3/uL (ref 0.0–0.5)
Eosinophils Relative: 1 %
HCT: 40.1 % (ref 39.0–52.0)
Hemoglobin: 13 g/dL (ref 13.0–17.0)
Immature Granulocytes: 0 %
Lymphocytes Relative: 28 %
Lymphs Abs: 2 10*3/uL (ref 0.7–4.0)
MCH: 24 pg — ABNORMAL LOW (ref 26.0–34.0)
MCHC: 32.4 g/dL (ref 30.0–36.0)
MCV: 74 fL — ABNORMAL LOW (ref 80.0–100.0)
Monocytes Absolute: 0.8 10*3/uL (ref 0.1–1.0)
Monocytes Relative: 11 %
Neutro Abs: 4.1 10*3/uL (ref 1.7–7.7)
Neutrophils Relative %: 60 %
Platelets: 228 10*3/uL (ref 150–400)
RBC: 5.42 MIL/uL (ref 4.22–5.81)
RDW: 14.8 % (ref 11.5–15.5)
WBC: 7 10*3/uL (ref 4.0–10.5)
nRBC: 0 % (ref 0.0–0.2)

## 2019-11-10 LAB — COMPREHENSIVE METABOLIC PANEL
ALT: 21 U/L (ref 0–44)
AST: 24 U/L (ref 15–41)
Albumin: 2.9 g/dL — ABNORMAL LOW (ref 3.5–5.0)
Alkaline Phosphatase: 89 U/L (ref 38–126)
Anion gap: 9 (ref 5–15)
BUN: 59 mg/dL — ABNORMAL HIGH (ref 6–20)
CO2: 21 mmol/L — ABNORMAL LOW (ref 22–32)
Calcium: 8.5 mg/dL — ABNORMAL LOW (ref 8.9–10.3)
Chloride: 107 mmol/L (ref 98–111)
Creatinine, Ser: 2.35 mg/dL — ABNORMAL HIGH (ref 0.61–1.24)
GFR calc Af Amer: 34 mL/min — ABNORMAL LOW (ref >60)
GFR calc non Af Amer: 29 mL/min — ABNORMAL LOW (ref >60)
Glucose, Bld: 147 mg/dL — ABNORMAL HIGH (ref 70–99)
Potassium: 4.4 mmol/L (ref 3.5–5.1)
Sodium: 137 mmol/L (ref 135–145)
Total Bilirubin: 0.5 mg/dL (ref 0.3–1.2)
Total Protein: 6.6 g/dL (ref 6.5–8.1)

## 2019-11-10 LAB — HEPARIN LEVEL (UNFRACTIONATED)
Heparin Unfractionated: 0.71 IU/mL — ABNORMAL HIGH (ref 0.30–0.70)
Heparin Unfractionated: 0.47 IU/mL (ref 0.30–0.70)
Heparin Unfractionated: <0.1 IU/mL — ABNORMAL LOW (ref 0.30–0.70)

## 2019-11-10 LAB — GLUCOSE, CAPILLARY
Glucose-Capillary: 145 mg/dL — ABNORMAL HIGH (ref 70–99)
Glucose-Capillary: 279 mg/dL — ABNORMAL HIGH (ref 70–99)
Glucose-Capillary: 141 mg/dL — ABNORMAL HIGH (ref 70–99)
Glucose-Capillary: 156 mg/dL — ABNORMAL HIGH (ref 70–99)

## 2019-11-10 MED ORDER — HEPARIN BOLUS VIA INFUSION
2700.0000 [IU] | Freq: Once | INTRAVENOUS | Status: AC
  Administered 2019-11-10: 20:00:00 2700 [IU] via INTRAVENOUS
  Filled 2019-11-10: qty 2700

## 2019-11-10 NOTE — Progress Notes (Addendum)
[place] for Heparin Indication: r/o PE  Allergies  Allergen Reactions  . Trulicity [Dulaglutide] Nausea And Vomiting   Patient Measurements: Height: 5\' 9"  (175.3 cm) Weight: 220 lb 0.3 oz (99.8 kg) IBW/kg (Calculated) : 70.7 HEPARIN DW (KG): 91.8  Vital Signs:    Labs: Recent Labs    11/07/19 2254 11/08/19 0507 11/08/19 0917 11/09/19 0632 11/10/19 0017 11/10/19 0534 11/10/19 1054 11/10/19 1650  HGB  --   --  13.6 12.9*  --  13.0  --   --   HCT  --   --  43.4 40.9  --  40.1  --   --   PLT  --   --  195 211  --  228  --   --   APTT  --  >160*  --   --   --   --   --   --   LABPROT  --  14.3  --   --   --   --   --   --   INR  --  1.1  --   --   --   --   --   --   HEPARINUNFRC  --   --  1.75* 1.04* 0.71*  --  0.47 <0.10*  CREATININE  --   --  3.59* 2.74*  --  2.35*  --   --   TROPONINIHS 35*  --  36* 37*  --   --   --   --     Estimated Creatinine Clearance: 38.9 mL/min (A) (by C-G formula based on SCr of 2.35 mg/dL (H)).   Medical History: Past Medical History:  Diagnosis Date  . Diabetes mellitus without complication ([place])   . Hypertension   . Neuropathy   . Renal disorder     Medications:  Medications Prior to Admission  Medication Sig Dispense Refill Last Dose  . amLODipine (NORVASC) 10 MG tablet Take 10 mg by mouth daily.   48+ hours at Unknown  . aspirin EC 81 MG tablet Take 81 mg by mouth daily.   48+ hours at Unknown  . atorvastatin (LIPITOR) 80 MG tablet Take 80 mg by mouth at bedtime.   48+ hours at Unknown  . Calcium Acetate 667 MG TABS Take 667 mg by mouth 3 (three) times daily with meals.   48+ hours at Unknown  . cholecalciferol (VITAMIN D) 25 MCG (1000 UT) tablet Take 2,000 Units by mouth daily.   48+ hours at Unknown  . empagliflozin (JARDIANCE) 10 MG TABS tablet Take 10 mg by mouth daily.   48+ hours at Unknown  . furosemide (LASIX) 40 MG tablet Take 40 mg by mouth daily.   48+ hours at Unknown  .  Insulin Glargine (LANTUS SOLOSTAR) 100 UNIT/ML Solostar Pen Inject 30 Units into the skin every 12 (twelve) hours.   48+ hours at Unknown  . Melatonin 5 MG TABS Take 5 mg by mouth at bedtime as needed (sleep).   Unknown at PRN  . metoprolol tartrate (LOPRESSOR) 50 MG tablet Take 50 mg by mouth 2 (two) times daily.   48+ hours at Unknown  . Multiple Vitamin (MULTIVITAMIN WITH MINERALS) TABS tablet Take 1 tablet by mouth daily.   48+ hours at Unknown  . omeprazole (PRILOSEC OTC) 20 MG tablet Take 20 mg by mouth daily.   48+ hours at Unknown    Assessment: Pharmacy asked to initiate and manage Heparin for possible PE.  Pt not on anticoagulants PTA per med list.  Baseline labs ordered.  12/8@0917 : HL 1.75, repeat 12/8@1002 : HL 1.51.  Spoke to nurse to hold heparin infusion for 2 hours and to restart infusion at 1,200 units/hr@1200 . Actual restart time documented was 1311. 12/8@2055  HL = 0.17 went from 1.5 on 1500 units/hr to 0.17 on 1200 unit/hr.   The 2055 HL appears to be some kind of error ; will order another HL for 2200 to confirm previous result.  12/8 @ 2237 HL = 0.36, therapeutic x 1.  Continue current rate and recheck in 8 hrs to confirm. 12/9@0632  HL 1.04 hold heparin infusion for 1 hour. Decreased rate to 1000 units/hr. Restarted@1030 (actual total stop time: 1 hour 40 minutes) 12/9 1625 HL 0.8 supratherapeutic. Will decrease heparin drip to 900 units/hr.  12/10 @ 0017 HL = 0.71, barely supratherapeutic - will decrease rate to 850 units/hr and recheck in 6 hrs 12/10 1054 HL 0.47 therapeutic (barely). Will continue heparin drip at 850 units/hr   Goal of Therapy:  Heparin level 0.3-0.7 units/ml Monitor platelets by anticoagulation protocol: Yes   Plan:  12/10 1650 HL < 0.10. Spoke with RN, heparin drip has not been held. Will bolus 2700 units and increase drip to 1050 units/hr. HL at 0300. CBC daily while on heparin drip.  Tawnya Crook, PharmD 11/10/2019,7:42 PM

## 2019-11-10 NOTE — Progress Notes (Signed)
[place] for Heparin Indication: r/o PE  Allergies  Allergen Reactions  . Trulicity [Dulaglutide] Nausea And Vomiting   Patient Measurements: Height: 5\' 9"  (175.3 cm) Weight: 220 lb 0.3 oz (99.8 kg) IBW/kg (Calculated) : 70.7 HEPARIN DW (KG): 91.8  Vital Signs: Temp: 98.1 F (36.7 C) (12/09 2146) Temp Source: Oral (12/09 2146) BP: 150/82 (12/09 2146) Pulse Rate: 71 (12/09 2146)  Labs: Recent Labs    11/07/19 1237 11/07/19 1237 11/07/19 2254 11/08/19 0507 11/08/19 0917 11/09/19 0632 11/09/19 1625 11/10/19 0017  HGB 14.3  --   --   --  13.6 12.9*  --   --   HCT 45.7  --   --   --  43.4 40.9  --   --   PLT 185  --   --   --  195 211  --   --   APTT  --   --   --  >160*  --   --   --   --   LABPROT  --   --   --  14.3  --   --   --   --   INR  --   --   --  1.1  --   --   --   --   HEPARINUNFRC  --    < >  --   --  1.75* 1.04* 0.80* 0.71*  CREATININE 3.97*  --   --   --  3.59* 2.74*  --   --   TROPONINIHS  --   --  35*  --  36* 37*  --   --    < > = values in this interval not displayed.    Estimated Creatinine Clearance: 33.4 mL/min (A) (by C-G formula based on SCr of 2.74 mg/dL (H)).   Medical History: Past Medical History:  Diagnosis Date  . Diabetes mellitus without complication ([place])   . Hypertension   . Neuropathy   . Renal disorder     Medications:  Medications Prior to Admission  Medication Sig Dispense Refill Last Dose  . amLODipine (NORVASC) 10 MG tablet Take 10 mg by mouth daily.   48+ hours at Unknown  . aspirin EC 81 MG tablet Take 81 mg by mouth daily.   48+ hours at Unknown  . atorvastatin (LIPITOR) 80 MG tablet Take 80 mg by mouth at bedtime.   48+ hours at Unknown  . Calcium Acetate 667 MG TABS Take 667 mg by mouth 3 (three) times daily with meals.   48+ hours at Unknown  . cholecalciferol (VITAMIN D) 25 MCG (1000 UT) tablet Take 2,000 Units by mouth daily.   48+ hours at Unknown  . empagliflozin  (JARDIANCE) 10 MG TABS tablet Take 10 mg by mouth daily.   48+ hours at Unknown  . furosemide (LASIX) 40 MG tablet Take 40 mg by mouth daily.   48+ hours at Unknown  . Insulin Glargine (LANTUS SOLOSTAR) 100 UNIT/ML Solostar Pen Inject 30 Units into the skin every 12 (twelve) hours.   48+ hours at Unknown  . Melatonin 5 MG TABS Take 5 mg by mouth at bedtime as needed (sleep).   Unknown at PRN  . metoprolol tartrate (LOPRESSOR) 50 MG tablet Take 50 mg by mouth 2 (two) times daily.   48+ hours at Unknown  . Multiple Vitamin (MULTIVITAMIN WITH MINERALS) TABS tablet Take 1 tablet by mouth daily.   48+ hours at Unknown  .  omeprazole (PRILOSEC OTC) 20 MG tablet Take 20 mg by mouth daily.   48+ hours at Unknown    Assessment: Pharmacy asked to initiate and manage Heparin for possible PE.  Pt not on anticoagulants PTA per med list.  Baseline labs ordered.  12/8@0917 : HL 1.75, repeat 12/8@1002 : HL 1.51.  Spoke to nurse to hold heparin infusion for 2 hours and to restart infusion at 1,200 units/hr@1200 . Actual restart time documented was 1311. 12/8@2055  HL = 0.17 went from 1.5 on 1500 units/hr to 0.17 on 1200 unit/hr.   The 2055 HL appears to be some kind of error ; will order another HL for 2200 to confirm previous result.  12/8 @ 2237 HL = 0.36, therapeutic x 1.  Continue current rate and recheck in 8 hrs to confirm. 12/9@0632  HL 1.04 hold heparin infusion for 1 hour. Decreased rate to 1000 units/hr. Restarted@1030 (actual total stop time: 1 hour 40 minutes) 12/10 @ 0017 HL = 0.71, barely supratherapeutic - will decrease rate to 850 units/hr and recheck in 6 hrs   Goal of Therapy:  Heparin level 0.3-0.7 units/ml Monitor platelets by anticoagulation protocol: Yes   Plan:  12/9 0017 HL 0.71 supratherapeutic (barely). Will decrease heparin drip to 850 units/hr. HL 12/10 at 1100. CBC daily while on heparin drip.  Ena Dawley, PharmD 11/10/2019,4:20 AM

## 2019-11-10 NOTE — Progress Notes (Signed)
PROGRESS NOTE    Bobby Sampson  [id number] DOB: 03-Apr-1961 DOA: 11/07/2019 PCP: [facility]   Brief Narrative:  58 y.o.African Landscape architect a known history of type 2 diabetes mellitus, hypertension and peripheral neuropathy, as well as stage IIIa who presented, who presented to the emergency room with acute onset of worsening generalized weakness. His wife who is a CNA apparently tested positive for COVID-19 and the patient got tested on 10/31/2019 and his test came back positive. He stated that he started having dry cough without dyspnea or wheezing however with recurrent nausea and vomiting as well as diarrhea. He admitted to intermittent fever and chills.  The patient was given IV remdesivir and Decadron as well as hydration with IV normal saline for 2 L bolus 8 units of IV NovoLog and 1 amp of calcium gluconate for hyperkalemia.   Subjective: Patient is feeling better.  Denies any more shortness of breath.  He wants to go home.  Assessment & Plan:   Active Problems:   COVID-19   Hypertension complicating diabetes ([place])   Type 2 diabetes mellitus with foot ulcer ([place])   AKI (acute kidney injury) ([place])  Acute hypoxic respiratory failure secondary to pneumonia likely due to COVID-19.   Patient was feeling better and saturating well on room air today.  Patient has elevated inflammatory markers and D-dimer. He was started on Decadron and remdesivir. He was also started on IV Heparin for PE susceptibility.  His Geneva score was 0 and Wells criteria for PE was 3 if PE is the #1 diagnosis.  D-dimer elevated above thousand. -CTA cannot be done because of AKI with CKD. -VQ scan cannot be done as patient is covert positive and that scan required prolonged exposure. -Continue IV heparin for now. -Continue Decadron and remdesivir, he will finish 5-day course tomorrow. -Continue vitamin C, zinc, aspirin and vitamin D. -Patient does not want convalescent plasma  treatment.  Elevated troponin.  Mildly elevated troponin, staying flat, most likely secondary to demand ischemia.  No chest pain.  Uncontrolled diabetes type 2.  A1c 14.3.  CBG remained elevated as patient is on steroids now. -Increase Lantus to 35 units twice daily. -Increase sliding scale to resistant.  Hyperkalemia.  Resolved.  AKI with CKD stage IIIa-IIIb.  Unknown recent baseline.  Creatinine was 1.8 in 2017.  On presentation it was 3.9 which has improved to 2.35 today. -Continue to monitor. -Avoid nephrotoxic.  Hypertension. -Continue home dose of Lopressor and amlodipine.  Dyslipidemia. -Continue home dose of statin.  GERD.  Continue PPI  Left plantar foot ulcer.  No sign of infection. -Continue with wound care.  Objective: Vitals:   11/09/19 0523 11/09/19 1250 11/09/19 2146 11/10/19 0528  BP: (!) 152/80 117/71 (!) 150/82 (!) 171/85  Pulse: 77 70 71 72  Resp: 20 16  20   Temp: 97.6 F (36.4 C) 97.8 F (36.6 C) 98.1 F (36.7 C) 97.7 F (36.5 C)  TempSrc: Oral Oral Oral Oral  SpO2: 96% 100% 100% 98%  Weight:      Height:        Intake/Output Summary (Last 24 hours) at 11/10/2019 1900 Last data filed at 11/10/2019 1700 Gross per 24 hour  Intake 284.15 ml  Output 1450 ml  Net -1165.85 ml   Filed Weights   11/07/19 1234 11/08/19 0405  Weight: 99.8 kg 99.8 kg    Examination:  General exam: Appears calm and comfortable  Respiratory system: Clear to auscultation. Respiratory effort normal. Cardiovascular system: S1 &  S2 heard, RRR. No JVD, murmurs, rubs, gallops or clicks. No pedal edema. Gastrointestinal system: Abdomen is nondistended, soft and nontender. No organomegaly or masses felt. Normal bowel sounds heard. Central nervous system: Alert and oriented. No focal neurological deficits. Extremities: Symmetric 5 x 5 power. Psychiatry: Judgement and insight appear normal. Mood & affect appropriate.    DVT prophylaxis: Heparin Code Status: Full Family  Communication: No family at bedside Disposition Plan: Pending improvement  Consultants:    Procedures:  Antimicrobials:   Data Reviewed: I have personally reviewed following labs and imaging studies  CBC: Recent Labs  Lab 11/07/19 1237 11/08/19 0917 11/09/19 0632 11/10/19 0534  WBC 6.1 6.5 6.1 7.0  NEUTROABS  --  5.1 4.0 4.1  HGB 14.3 13.6 12.9* 13.0  HCT 45.7 43.4 40.9 40.1  MCV 75.8* 77.5* 76.4* 74.0*  PLT 185 195 211 [id number]   Basic Metabolic Panel: Recent Labs  Lab 11/07/19 1237 11/08/19 0917 11/09/19 0632 11/10/19 0534  NA 130* 136 133* 137  K 5.5* 5.1 4.8 4.4  CL 96* 102 102 107  CO2 20* 17* 20* 21*  GLUCOSE 447* 407* 348* 147*  BUN 58* 61* 68* 59*  CREATININE 3.97* 3.59* 2.74* 2.35*  CALCIUM 8.3* 8.2* 8.1* 8.5*   GFR: Estimated Creatinine Clearance: 38.9 mL/min (A) (by C-G formula based on SCr of 2.35 mg/dL (H)). Liver Function Tests: Recent Labs  Lab 11/07/19 1719 11/08/19 0917 11/09/19 0632 11/10/19 0534  AST 39 30 23 24   ALT 34 31 26 21   ALKPHOS 118 104 89 89  BILITOT 0.4 0.8 0.6 0.5  PROT 7.5 7.2 6.6 6.6  ALBUMIN 3.2* 3.0* 2.8* 2.9*   No results for input(s): LIPASE, AMYLASE in the last 168 hours. No results for input(s): AMMONIA in the last 168 hours. Coagulation Profile: Recent Labs  Lab 11/08/19 0507  INR 1.1   Cardiac Enzymes: No results for input(s): CKTOTAL, CKMB, CKMBINDEX, TROPONINI in the last 168 hours. BNP (last 3 results) No results for input(s): PROBNP in the last 8760 hours. HbA1C: Recent Labs    11/08/19 0917  HGBA1C 14.3*   CBG: Recent Labs  Lab 11/09/19 1647 11/09/19 2142 11/10/19 0932 11/10/19 1151 11/10/19 1640  GLUCAP 290* 199* 145* 279* 141*   Lipid Profile: No results for input(s): CHOL, HDL, LDLCALC, TRIG, CHOLHDL, LDLDIRECT in the last 72 hours. Thyroid Function Tests: No results for input(s): TSH, T4TOTAL, FREET4, T3FREE, THYROIDAB in the last 72 hours. Anemia Panel: No results for input(s):  VITAMINB12, FOLATE, FERRITIN, TIBC, IRON, RETICCTPCT in the last 72 hours. Sepsis Labs: Recent Labs  Lab 11/07/19 1719  PROCALCITON 0.38  LATICACIDVEN 1.6    No results found for this or any previous visit (from the past 240 hour(s)).   Radiology Studies: No results found.  Scheduled Meds: . amLODipine  10 mg Oral Daily  . atorvastatin  80 mg Oral QHS  . cholecalciferol  1,000 Units Oral Daily  . insulin aspart  0-20 Units Subcutaneous TID WC  . insulin aspart  0-5 Units Subcutaneous QHS  . insulin glargine  30 Units Subcutaneous BID  . metoprolol tartrate  50 mg Oral BID  . pantoprazole  20 mg Oral Daily  . sodium chloride flush  3 mL Intravenous Once   Continuous Infusions: . sodium chloride Stopped (11/10/19 1042)  . heparin 850 Units/hr (11/10/19 1700)  . remdesivir 100 mg in NS 100 mL Stopped (11/10/19 1015)     LOS: 3 days   Time spent: 35 minutes.  I personally reviewed his chart.  Lorella Nimrod, MD [facility] Pager [phone]  If 7PM-7AM, please contact night-coverage [email or web address] Password [facility] 11/10/2019, 7:00 PM   This record has been created using Systems analyst. Errors have been sought and corrected,but may not always be located. Such creation errors do not reflect on the standard of care.

## 2019-11-10 NOTE — Progress Notes (Signed)
[place] for Heparin Indication: r/o PE  Allergies  Allergen Reactions  . Trulicity [Dulaglutide] Nausea And Vomiting   Patient Measurements: Height: 5\' 9"  (175.3 cm) Weight: 220 lb 0.3 oz (99.8 kg) IBW/kg (Calculated) : 70.7 HEPARIN DW (KG): 91.8  Vital Signs: Temp: 97.7 F (36.5 C) (12/10 0528) Temp Source: Oral (12/10 0528) BP: 171/85 (12/10 0528) Pulse Rate: 72 (12/10 0528)  Labs: Recent Labs    11/07/19 2254 11/08/19 0507 11/08/19 0917 11/09/19 [id number] 11/09/19 1625 11/10/19 0017 11/10/19 0534 11/10/19 1054  HGB  --   --  13.6 12.9*  --   --  13.0  --   HCT  --   --  43.4 40.9  --   --  40.1  --   PLT  --   --  195 211  --   --  228  --   APTT  --  >160*  --   --   --   --   --   --   LABPROT  --  14.3  --   --   --   --   --   --   INR  --  1.1  --   --   --   --   --   --   HEPARINUNFRC  --   --  1.75* 1.04* 0.80* 0.71*  --  0.47  CREATININE  --   --  3.59* 2.74*  --   --  2.35*  --   TROPONINIHS 35*  --  36* 37*  --   --   --   --     Estimated Creatinine Clearance: 38.9 mL/min (A) (by C-G formula based on SCr of 2.35 mg/dL (H)).   Medical History: Past Medical History:  Diagnosis Date  . Diabetes mellitus without complication ([place])   . Hypertension   . Neuropathy   . Renal disorder     Medications:  Medications Prior to Admission  Medication Sig Dispense Refill Last Dose  . amLODipine (NORVASC) 10 MG tablet Take 10 mg by mouth daily.   48+ hours at Unknown  . aspirin EC 81 MG tablet Take 81 mg by mouth daily.   48+ hours at Unknown  . atorvastatin (LIPITOR) 80 MG tablet Take 80 mg by mouth at bedtime.   48+ hours at Unknown  . Calcium Acetate 667 MG TABS Take 667 mg by mouth 3 (three) times daily with meals.   48+ hours at Unknown  . cholecalciferol (VITAMIN D) 25 MCG (1000 UT) tablet Take 2,000 Units by mouth daily.   48+ hours at Unknown  . empagliflozin (JARDIANCE) 10 MG TABS tablet Take 10 mg by mouth  daily.   48+ hours at Unknown  . furosemide (LASIX) 40 MG tablet Take 40 mg by mouth daily.   48+ hours at Unknown  . Insulin Glargine (LANTUS SOLOSTAR) 100 UNIT/ML Solostar Pen Inject 30 Units into the skin every 12 (twelve) hours.   48+ hours at Unknown  . Melatonin 5 MG TABS Take 5 mg by mouth at bedtime as needed (sleep).   Unknown at PRN  . metoprolol tartrate (LOPRESSOR) 50 MG tablet Take 50 mg by mouth 2 (two) times daily.   48+ hours at Unknown  . Multiple Vitamin (MULTIVITAMIN WITH MINERALS) TABS tablet Take 1 tablet by mouth daily.   48+ hours at Unknown  . omeprazole (PRILOSEC OTC) 20 MG tablet Take 20 mg by mouth daily.  48+ hours at Unknown    Assessment: Pharmacy asked to initiate and manage Heparin for possible PE.  Pt not on anticoagulants PTA per med list.  Baseline labs ordered.  12/8@0917 : HL 1.75, repeat 12/8@1002 : HL 1.51.  Spoke to nurse to hold heparin infusion for 2 hours and to restart infusion at 1,200 units/hr@1200 . Actual restart time documented was 1311. 12/8@2055  HL = 0.17 went from 1.5 on 1500 units/hr to 0.17 on 1200 unit/hr.   The 2055 HL appears to be some kind of error ; will order another HL for 2200 to confirm previous result.  12/8 @ 2237 HL = 0.36, therapeutic x 1.  Continue current rate and recheck in 8 hrs to confirm. 12/9@0632  HL 1.04 hold heparin infusion for 1 hour. Decreased rate to 1000 units/hr. Restarted@1030 (actual total stop time: 1 hour 40 minutes) 12/9 1625 HL 0.8 supratherapeutic. Will decrease heparin drip to 900 units/hr.  12/10 @ 0017 HL = 0.71, barely supratherapeutic - will decrease rate to 850 units/hr and recheck in 6 hrs   Goal of Therapy:  Heparin level 0.3-0.7 units/ml Monitor platelets by anticoagulation protocol: Yes   Plan:  12/10 1054 HL 0.47 therapeutic (barely). Will continue heparin drip at 850 units/hr. Next HL 12/10 at 1700. CBC daily while on heparin drip.  Pearla Dubonnet, PharmD 11/10/2019,1:54 PM

## 2019-11-11 DIAGNOSIS — Z794 Long term (current) use of insulin: Secondary | ICD-10-CM

## 2019-11-11 LAB — CBC WITH DIFFERENTIAL/PLATELET
Abs Immature Granulocytes: 0.09 10*3/uL — ABNORMAL HIGH (ref 0.00–0.07)
Basophils Absolute: 0 10*3/uL (ref 0.0–0.1)
Basophils Relative: 0 %
Eosinophils Absolute: 0.1 10*3/uL (ref 0.0–0.5)
Eosinophils Relative: 2 %
HCT: 40.7 % (ref 39.0–52.0)
Hemoglobin: 13.4 g/dL (ref 13.0–17.0)
Immature Granulocytes: 1 %
Lymphocytes Relative: 33 %
Lymphs Abs: 2.1 10*3/uL (ref 0.7–4.0)
MCH: 24.1 pg — ABNORMAL LOW (ref 26.0–34.0)
MCHC: 32.9 g/dL (ref 30.0–36.0)
MCV: 73.2 fL — ABNORMAL LOW (ref 80.0–100.0)
Monocytes Absolute: 0.8 10*3/uL (ref 0.1–1.0)
Monocytes Relative: 12 %
Neutro Abs: 3.4 10*3/uL (ref 1.7–7.7)
Neutrophils Relative %: 52 %
Platelets: 235 10*3/uL (ref 150–400)
RBC: 5.56 MIL/uL (ref 4.22–5.81)
RDW: 14.9 % (ref 11.5–15.5)
WBC: 6.5 10*3/uL (ref 4.0–10.5)
nRBC: 0 % (ref 0.0–0.2)

## 2019-11-11 LAB — HEPARIN LEVEL (UNFRACTIONATED): Heparin Unfractionated: 0.77 IU/mL — ABNORMAL HIGH (ref 0.30–0.70)

## 2019-11-11 LAB — COMPREHENSIVE METABOLIC PANEL
ALT: 23 U/L (ref 0–44)
AST: 29 U/L (ref 15–41)
Albumin: 2.9 g/dL — ABNORMAL LOW (ref 3.5–5.0)
Alkaline Phosphatase: 92 U/L (ref 38–126)
Anion gap: 10 (ref 5–15)
BUN: 54 mg/dL — ABNORMAL HIGH (ref 6–20)
CO2: 21 mmol/L — ABNORMAL LOW (ref 22–32)
Calcium: 8.4 mg/dL — ABNORMAL LOW (ref 8.9–10.3)
Chloride: 106 mmol/L (ref 98–111)
Creatinine, Ser: 2.17 mg/dL — ABNORMAL HIGH (ref 0.61–1.24)
GFR calc Af Amer: 37 mL/min — ABNORMAL LOW (ref >60)
GFR calc non Af Amer: 32 mL/min — ABNORMAL LOW (ref >60)
Glucose, Bld: 156 mg/dL — ABNORMAL HIGH (ref 70–99)
Potassium: 4.3 mmol/L (ref 3.5–5.1)
Sodium: 137 mmol/L (ref 135–145)
Total Bilirubin: 0.6 mg/dL (ref 0.3–1.2)
Total Protein: 6.8 g/dL (ref 6.5–8.1)

## 2019-11-11 LAB — GLUCOSE, CAPILLARY
Glucose-Capillary: 140 mg/dL — ABNORMAL HIGH (ref 70–99)
Glucose-Capillary: 227 mg/dL — ABNORMAL HIGH (ref 70–99)

## 2019-11-11 NOTE — Progress Notes (Signed)
Inpatient Diabetes Program Recommendations  AACE/ADA: New Consensus Statement on Inpatient Glycemic Control (2015)  Target Ranges:  Prepandial:   less than 140 mg/dL      Peak postprandial:   less than 180 mg/dL (1-2 hours)      Critically ill patients:  140 - 180 mg/dL   Results for Bobby Sampson, Bobby Sampson (MRN [id number]) as of 11/11/2019 10:03  Ref. Range 11/10/2019 09:32 11/10/2019 11:51 11/10/2019 16:40 11/10/2019 21:42  Glucose-Capillary Latest Ref Range: 70 - 99 mg/dL 145 (H) 279 (H) 141 (H) 156 (H)   Results for Bobby Sampson, Bobby Sampson (MRN [id number]) as of 11/11/2019 10:03  Ref. Range 11/11/2019 07:55  Glucose-Capillary Latest Ref Range: 70 - 99 mg/dL 140 (H)   Results for Bobby Sampson, Bobby Sampson (MRN [id number]) as of 11/11/2019 10:03  Ref. Range 11/08/2019 09:17  Hemoglobin A1C Latest Ref Range: 4.8 - 5.6 % 14.3 (H)  (363 mg/dl)    Admit with: Acute hypoxic respiratory failure secondary to pneumonia likely due to COVID-19/ Hyperglycemia/ Hyperkalemia  History: DM, CKD  Home DM Meds: Lantus 30 units BID                             Jardiance 10 mg Daily  Current Insulin Orders: Lantus 30 units BID         Novolog Resistant Correction Scale/ SSI (0-20 units) TID AC + HS     PCP: [facility]  Well controlled on current regimen.  Decadron stopped--last dose given PM on 12/07.     --Will follow patient during hospitalization--  Wyn Quaker RN, MSN, CDE Diabetes Coordinator Inpatient Glycemic Control Team Team Pager: [phone] (8a-5p)

## 2019-11-11 NOTE — Progress Notes (Signed)
Bobby Sampson and [name]. VSS. Pt tolerating diet well. No complaints of nausea or vomiting. IV removed intact, prescriptions given. Pt voices understanding of discharge instructions with no further questions. Patient discharged via wheelchair with NT  Allergies as of 11/11/2019      Reactions   Trulicity [dulaglutide] Nausea And Vomiting      Medication List    TAKE these medications   amLODipine 10 MG tablet Commonly known as: NORVASC Take 10 mg by mouth daily.   aspirin EC 81 MG tablet Take 81 mg by mouth daily.   atorvastatin 80 MG tablet Commonly known as: LIPITOR Take 80 mg by mouth at bedtime.   Calcium Acetate 667 MG Tabs Take 667 mg by mouth 3 (three) times daily with meals.   cholecalciferol 25 MCG (1000 UT) tablet Commonly known as: VITAMIN D Take 2,000 Units by mouth daily.   empagliflozin 10 MG Tabs tablet Commonly known as: JARDIANCE Take 10 mg by mouth daily.   furosemide 40 MG tablet Commonly known as: LASIX Take 40 mg by mouth daily.   Lantus SoloStar 100 UNIT/ML Solostar Pen Generic drug: Insulin Glargine Inject 30 Units into the skin every 12 (twelve) hours.   Melatonin 5 MG Tabs Take 5 mg by mouth at bedtime as needed (sleep).   metoprolol tartrate 50 MG tablet Commonly known as: LOPRESSOR Take 50 mg by mouth 2 (two) times daily.   multivitamin with minerals Tabs tablet Take 1 tablet by mouth daily.   omeprazole 20 MG tablet Commonly known as: PRILOSEC OTC Take 20 mg by mouth daily.       Vitals:   11/11/19 0856 11/11/19 1128  BP: 132/79 140/81  Pulse: 73 71  Resp:  16  Temp:  98 F (36.7 Bobby)  SpO2:  97%    Bobby Sampson Bobby Sampson

## 2019-11-11 NOTE — Discharge Summary (Signed)
Physician Discharge Summary  Bobby Sampson [id number] DOB: October 02, 1961 DOA: 11/07/2019  PCP: [facility]  Admit date: 11/07/2019 Discharge date: 11/11/2019  Admitted From: Home Disposition: Home  Recommendations for Outpatient Follow-up:  1. Follow up with PCP in 1-2 weeks 2. Please obtain BMP/CBC in one week 3. Patient has high A1c and need a better control of diabetes. 4. 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 His wife who is a CNA apparently tested positive for COVID-19 and the patient got tested on 10/31/2019 and his test came back positive. He stated that he started having dry cough without dyspnea or wheezing however with recurrent nausea and vomiting as well as diarrhea. He admitted to intermittent fever and chills.  Patient was hypoxic initially requiring 2 to 3 L .  There was some concern for PE.  Markedly elevated D-dimer.  CTA cannot be done because of his worsening renal function.  VQ scan require prolonged exposure and cannot be done with Covid positive.  He was given empirical IV Heparin.  His oxygenation improved next day and he was stable on room air.  We kept him for 5 days to complete the course of remdesivir.  He was advised to keep himself quarantine for another 2 weeks.  Patient has uncontrolled diabetes with A1c above 14.  His blood sugar remained well controlled while hospitalization.  He was discharged home on his home regimen and advised to follow-up with PCP for better control of his diabetes.  He will continue his prior home  meds.  Discharge Diagnoses:  Active Problems:   COVID-19   Hypertension complicating diabetes ([place])   Type 2 diabetes mellitus with foot ulcer ([place])   AKI (acute kidney injury) [facility])   Discharge Instructions  Discharge Instructions    Diet - low sodium heart healthy   Complete by: As directed    Discharge instructions   Complete by: As directed    It was pleasure taking care of you. I am glad you are doing well and ready to go home. Please keep your self quarantine for 2 weeks. Your diabetes is uncontrolled with an high A1c above 14.  Please follow-up with your PCP for better control of diabetes to prevent future complications.  You need to follow strict diabetic diet.  Your PCP can work with you for better control. Continue taking all of your medications as directed.   Increase activity slowly   Complete by: As directed      Allergies as of 11/11/2019      Reactions   Trulicity [dulaglutide] Nausea And Vomiting      Medication List    TAKE these medications   amLODipine 10 MG tablet Commonly known as: NORVASC Take 10 mg by mouth daily.   aspirin EC 81 MG tablet Take 81 mg by mouth daily.   atorvastatin 80 MG tablet Commonly known as: LIPITOR Take 80 mg by mouth at bedtime.   Calcium Acetate 667 MG Tabs Take 667 mg by mouth 3 (three) times daily with meals.   cholecalciferol 25 MCG (1000 UT) tablet Commonly known as: VITAMIN D Take 2,000 Units by mouth daily.  empagliflozin 10 MG Tabs tablet Commonly known as: JARDIANCE Take 10 mg by mouth daily.   furosemide 40 MG tablet Commonly known as: LASIX Take 40 mg by mouth daily.   Lantus SoloStar 100 UNIT/ML Solostar Pen Generic drug: Insulin Glargine Inject 30 Units into the skin every 12 (twelve) hours.   Melatonin 5 MG Tabs Take 5 mg by mouth at bedtime as needed (sleep).   metoprolol tartrate 50 MG tablet Commonly known as: LOPRESSOR Take 50 mg by mouth 2 (two) times daily.   multivitamin with  minerals Tabs tablet Take 1 tablet by mouth daily.   omeprazole 20 MG tablet Commonly known as: PRILOSEC OTC Take 20 mg by mouth daily.       Allergies  Allergen Reactions  . Trulicity [Dulaglutide] Nausea And Vomiting    Consultations: None  Procedures/Studies: DG Chest Portable 1 View  Result Date: 11/07/2019 CLINICAL DATA:  Weakness. EXAM: PORTABLE CHEST 1 VIEW COMPARISON:  None. FINDINGS: The heart size and mediastinal contours are within normal limits. Both lungs are clear. No pneumothorax or pleural effusion is noted. The visualized skeletal structures are unremarkable. IMPRESSION: No active disease. Electronically Signed   By: Marijo Conception M.D.   On: 11/07/2019 16:21    Subjective: Patient was feeling better when seen this morning.  Denies any chest pain or shortness of breath.  He was eager to be discharged.  Discharge Exam: Vitals:   11/11/19 0605 11/11/19 0856  BP: (!) 150/79 132/79  Pulse: 71 73  Resp:    Temp: 98.2 F (36.8 C)   SpO2: 97%    Vitals:   11/10/19 2023 11/11/19 0604 11/11/19 0605 11/11/19 0856  BP: (!) 149/84 (!) 129/107 (!) 150/79 132/79  Pulse: 77 72 71 73  Resp:      Temp: 98.6 F (37 C) 98.2 F (36.8 C) 98.2 F (36.8 C)   TempSrc: Oral Oral Oral   SpO2: 94% 94% 97%   Weight:      Height:        General: Pt is alert, awake, not in acute distress Cardiovascular: RRR, S1/S2 +, no rubs, no gallops Respiratory: CTA bilaterally, no wheezing, no rhonchi Abdominal: Soft, NT, ND, bowel sounds + Extremities: no edema, no cyanosis   The results of significant diagnostics from this hospitalization (including imaging, microbiology, ancillary and laboratory) are listed below for reference.     Microbiology: No results found for this or any previous visit (from the past 240 hour(s)).   Labs: BNP (last 3 results) No results for input(s): BNP in the last 8760 hours. Basic Metabolic Panel: Recent Labs  Lab 11/07/19 1237  11/08/19 0917 11/09/19 0632 11/10/19 0534 11/11/19 0345  NA 130* 136 133* 137 137  K 5.5* 5.1 4.8 4.4 4.3  CL 96* 102 102 107 106  CO2 20* 17* 20* 21* 21*  GLUCOSE 447* 407* 348* 147* 156*  BUN 58* 61* 68* 59* 54*  CREATININE 3.97* 3.59* 2.74* 2.35* 2.17*  CALCIUM 8.3* 8.2* 8.1* 8.5* 8.4*   Liver Function Tests: Recent Labs  Lab 11/07/19 1719 11/08/19 0917 11/09/19 0632 11/10/19 0534 11/11/19 0345  AST 39 30 23 24 29   ALT 34 31 26 21 23   ALKPHOS 118 104 89 89 92  BILITOT 0.4 0.8 0.6 0.5 0.6  PROT 7.5 7.2 6.6 6.6 6.8  ALBUMIN 3.2* 3.0* 2.8* 2.9* 2.9*   No results for input(s): LIPASE, AMYLASE in the last 168 hours. No results for input(s): AMMONIA in the last 168  hours. CBC: Recent Labs  Lab 11/07/19 1237 11/08/19 0917 11/09/19 0632 11/10/19 0534 11/11/19 0345  WBC 6.1 6.5 6.1 7.0 6.5  NEUTROABS  --  5.1 4.0 4.1 3.4  HGB 14.3 13.6 12.9* 13.0 13.4  HCT 45.7 43.4 40.9 40.1 40.7  MCV 75.8* 77.5* 76.4* 74.0* 73.2*  PLT 185 195 211 228 235   Cardiac Enzymes: No results for input(s): CKTOTAL, CKMB, CKMBINDEX, TROPONINI in the last 168 hours. BNP: Invalid input(s): POCBNP CBG: Recent Labs  Lab 11/10/19 0932 11/10/19 1151 11/10/19 1640 11/10/19 2142 11/11/19 0755  GLUCAP 145* 279* 141* 156* 140*   D-Dimer No results for input(s): DDIMER in the last 72 hours. Hgb A1c No results for input(s): HGBA1C in the last 72 hours. Lipid Profile No results for input(s): CHOL, HDL, LDLCALC, TRIG, CHOLHDL, LDLDIRECT in the last 72 hours. Thyroid function studies No results for input(s): TSH, T4TOTAL, T3FREE, THYROIDAB in the last 72 hours.  Invalid input(s): FREET3 Anemia work up No results for input(s): VITAMINB12, FOLATE, FERRITIN, TIBC, IRON, RETICCTPCT in the last 72 hours. Urinalysis    Component Value Date/Time   COLORURINE YELLOW (A) 11/07/2019 1237   APPEARANCEUR CLEAR (A) 11/07/2019 1237   LABSPEC 1.017 11/07/2019 1237   PHURINE 5.0 11/07/2019 1237    GLUCOSEU >=500 (A) 11/07/2019 1237   HGBUR NEGATIVE 11/07/2019 1237   [place] 11/07/2019 1237   KETONESUR NEGATIVE 11/07/2019 1237   PROTEINUR 100 (A) 11/07/2019 1237   NITRITE NEGATIVE 11/07/2019 1237   LEUKOCYTESUR NEGATIVE 11/07/2019 1237   Sepsis Labs Invalid input(s): PROCALCITONIN,  WBC,  LACTICIDVEN Microbiology No results found for this or any previous visit (from the past 240 hour(s)).   Time coordinating discharge: Over 30 minutes  SIGNED:  Lorella Nimrod, MD  [facility] 11/11/2019, 11:03 AM Pager [phone]  If 7PM-7AM, please contact night-coverage [email or web address] Password [email or web address]  This record has been created using Systems analyst. Errors have been sought and corrected,but may not always be located. Such creation errors do not reflect on the standard of care.

## 2019-11-11 NOTE — Progress Notes (Signed)
[place] for Heparin Indication: r/o PE  Allergies  Allergen Reactions  . Trulicity [Dulaglutide] Nausea And Vomiting   Patient Measurements: Height: 5\' 9"  (175.3 cm) Weight: 220 lb 0.3 oz (99.8 kg) IBW/kg (Calculated) : 70.7 HEPARIN DW (KG): 91.8  Vital Signs: Temp: 98.6 F (37 C) (12/10 2023) Temp Source: Oral (12/10 2023) BP: 149/84 (12/10 2023) Pulse Rate: 77 (12/10 2023)  Labs: Recent Labs    11/08/19 0507 11/08/19 0917 11/09/19 [id number] 11/10/19 0534 11/10/19 1054 11/10/19 1650 11/11/19 0345  HGB  --  13.6 12.9* 13.0  --   --  13.4  HCT  --  43.4 40.9 40.1  --   --  40.7  PLT  --  195 211 228  --   --  235  APTT >160*  --   --   --   --   --   --   LABPROT 14.3  --   --   --   --   --   --   INR 1.1  --   --   --   --   --   --   HEPARINUNFRC  --  1.75* 1.04*  --  0.47 <0.10* 0.77*  CREATININE  --  3.59* 2.74* 2.35*  --   --   --   TROPONINIHS  --  36* 37*  --   --   --   --     Estimated Creatinine Clearance: 38.9 mL/min (A) (by C-G formula based on SCr of 2.35 mg/dL (H)).   Medical History: Past Medical History:  Diagnosis Date  . Diabetes mellitus without complication ([place])   . Hypertension   . Neuropathy   . Renal disorder     Medications:  Medications Prior to Admission  Medication Sig Dispense Refill Last Dose  . amLODipine (NORVASC) 10 MG tablet Take 10 mg by mouth daily.   48+ hours at Unknown  . aspirin EC 81 MG tablet Take 81 mg by mouth daily.   48+ hours at Unknown  . atorvastatin (LIPITOR) 80 MG tablet Take 80 mg by mouth at bedtime.   48+ hours at Unknown  . Calcium Acetate 667 MG TABS Take 667 mg by mouth 3 (three) times daily with meals.   48+ hours at Unknown  . cholecalciferol (VITAMIN D) 25 MCG (1000 UT) tablet Take 2,000 Units by mouth daily.   48+ hours at Unknown  . empagliflozin (JARDIANCE) 10 MG TABS tablet Take 10 mg by mouth daily.   48+ hours at Unknown  . furosemide (LASIX) 40 MG  tablet Take 40 mg by mouth daily.   48+ hours at Unknown  . Insulin Glargine (LANTUS SOLOSTAR) 100 UNIT/ML Solostar Pen Inject 30 Units into the skin every 12 (twelve) hours.   48+ hours at Unknown  . Melatonin 5 MG TABS Take 5 mg by mouth at bedtime as needed (sleep).   Unknown at PRN  . metoprolol tartrate (LOPRESSOR) 50 MG tablet Take 50 mg by mouth 2 (two) times daily.   48+ hours at Unknown  . Multiple Vitamin (MULTIVITAMIN WITH MINERALS) TABS tablet Take 1 tablet by mouth daily.   48+ hours at Unknown  . omeprazole (PRILOSEC OTC) 20 MG tablet Take 20 mg by mouth daily.   48+ hours at Unknown    Assessment: Pharmacy asked to initiate and manage Heparin for possible PE.  Pt not on anticoagulants PTA per med list.  Baseline labs ordered.  12/8@0917 :  HL 1.75, repeat 12/8@1002 : HL 1.51.  Spoke to nurse to hold heparin infusion for 2 hours and to restart infusion at 1,200 units/hr@1200 . Actual restart time documented was 1311. 12/8@2055  HL = 0.17 went from 1.5 on 1500 units/hr to 0.17 on 1200 unit/hr.   The 2055 HL appears to be some kind of error ; will order another HL for 2200 to confirm previous result.  12/8 @ 2237 HL = 0.36, therapeutic x 1.  Continue current rate and recheck in 8 hrs to confirm. 12/9@0632  HL 1.04 hold heparin infusion for 1 hour. Decreased rate to 1000 units/hr. Restarted@1030 (actual total stop time: 1 hour 40 minutes) 12/9 1625 HL 0.8 supratherapeutic. Will decrease heparin drip to 900 units/hr.  12/10 @ 0017 HL = 0.71, barely supratherapeutic - will decrease rate to 850 units/hr and recheck in 6 hrs 12/10 1054 HL 0.47 therapeutic (barely). Will continue heparin drip at 850 units/hr   Goal of Therapy:  Heparin level 0.3-0.7 units/ml Monitor platelets by anticoagulation protocol: Yes   Plan:  12/10 1650 HL < 0.10. Spoke with RN, heparin drip has not been held. Will bolus 2700 units and increase drip to 1050 units/hr. HL at 0300. CBC daily while on heparin  drip.  12/11 @ 0345 HL = 0.77, barely supratherapeutic.  Will reduce heparin infusion slightly to 1000 units/hr and recheck HL in 6 hrs.  CBC stable.    Ena Dawley, PharmD 11/11/2019,4:52 AM

## 2020-01-17 ENCOUNTER — Ambulatory Visit (INDEPENDENT_AMBULATORY_CARE_PROVIDER_SITE_OTHER): Payer: Medicaid Other | Admitting: Podiatry

## 2020-01-17 ENCOUNTER — Other Ambulatory Visit: Payer: Self-pay | Admitting: Podiatry

## 2020-01-17 ENCOUNTER — Ambulatory Visit (INDEPENDENT_AMBULATORY_CARE_PROVIDER_SITE_OTHER): Payer: Medicaid Other

## 2020-01-17 ENCOUNTER — Other Ambulatory Visit: Payer: Self-pay

## 2020-01-17 DIAGNOSIS — S98112A Complete traumatic amputation of left great toe, initial encounter: Secondary | ICD-10-CM

## 2020-01-17 DIAGNOSIS — M79672 Pain in left foot: Secondary | ICD-10-CM

## 2020-01-17 DIAGNOSIS — L97509 Non-pressure chronic ulcer of other part of unspecified foot with unspecified severity: Secondary | ICD-10-CM

## 2020-01-17 DIAGNOSIS — L97511 Non-pressure chronic ulcer of other part of right foot limited to breakdown of skin: Secondary | ICD-10-CM | POA: Diagnosis not present

## 2020-01-17 DIAGNOSIS — E11621 Type 2 diabetes mellitus with foot ulcer: Secondary | ICD-10-CM

## 2020-01-17 DIAGNOSIS — Z794 Long term (current) use of insulin: Secondary | ICD-10-CM

## 2020-01-17 DIAGNOSIS — I739 Peripheral vascular disease, unspecified: Secondary | ICD-10-CM

## 2020-01-18 ENCOUNTER — Telehealth: Payer: Self-pay | Admitting: *Deleted

## 2020-01-18 ENCOUNTER — Encounter: Payer: Self-pay | Admitting: Podiatry

## 2020-01-18 NOTE — Telephone Encounter (Signed)
Patient called and left message on voicemail stating he has some questions for Dr. Serita Grit assistant and would like a call back.

## 2020-01-18 NOTE — Progress Notes (Signed)
Subjective:  Patient ID: Bobby Sampson, male    DOB: 1961/01/08,  MRN: [id number]  Chief Complaint  Patient presents with  . Toe Pain    pt is looking to get a second opinion on his left great toe amputation, pt states that the last physician that treated his wound did not properly clean it thouroghly, which could have possibly lead to further infection.    59 y.o. male presents with the above complaint.  Patient presents to me for a second opinion of left partial first ray amputation that he had done in January 18.  Patient is was concerned that he is not being treated correctly at the [facility].  He is also concerned about poor circulation down to the lower extremity that could be the main culprit of why he had an amputation.  He states he is a diabetic with A1c of around 13.  Today he is here primarily to get a second opinion to evaluate the surgical outcome that he had to go through as well as if there is anything that could be done to improve circulation down to the lower extremity.  He denies any other acute complaints.  He currently has a wound VAC that was placed on his change by the nursing staff 3 times a week.  I will not be planning on taking the wound VAC off during his clinic visit today.   Review of Systems: Negative except as noted in the HPI. Denies N/V/F/Ch.  Past Medical History:  Diagnosis Date  . Diabetes mellitus without complication ([place])   . Hypertension   . Neuropathy   . Renal disorder     Current Outpatient Medications:  .  amLODipine (NORVASC) 10 MG tablet, Take 10 mg by mouth daily., Disp: , Rfl:  .  aspirin EC 81 MG tablet, Take 81 mg by mouth daily., Disp: , Rfl:  .  atorvastatin (LIPITOR) 80 MG tablet, Take 80 mg by mouth at bedtime., Disp: , Rfl:  .  Calcium Acetate 667 MG TABS, Take 667 mg by mouth 3 (three) times daily with meals., Disp: , Rfl:  .  cholecalciferol (VITAMIN D) 25 MCG (1000 UT) tablet, Take 2,000 Units by mouth daily., Disp: , Rfl:   .  empagliflozin (JARDIANCE) 10 MG TABS tablet, Take 10 mg by mouth daily., Disp: , Rfl:  .  furosemide (LASIX) 40 MG tablet, Take 40 mg by mouth daily., Disp: , Rfl:  .  Insulin Glargine (LANTUS SOLOSTAR) 100 UNIT/ML Solostar Pen, Inject 30 Units into the skin every 12 (twelve) hours., Disp: , Rfl:  .  Melatonin 5 MG TABS, Take 5 mg by mouth at bedtime as needed (sleep)., Disp: , Rfl:  .  metoprolol tartrate (LOPRESSOR) 50 MG tablet, Take 50 mg by mouth 2 (two) times daily., Disp: , Rfl:  .  Multiple Vitamin (MULTIVITAMIN WITH MINERALS) TABS tablet, Take 1 tablet by mouth daily., Disp: , Rfl:  .  omeprazole (PRILOSEC OTC) 20 MG tablet, Take 20 mg by mouth daily., Disp: , Rfl:   Social History   Tobacco Use  Smoking Status Never Smoker  Smokeless Tobacco Never Used    Allergies  Allergen Reactions  . Trulicity [Dulaglutide] Nausea And Vomiting   Objective:  There were no vitals filed for this visit. There is no height or weight on file to calculate BMI. Constitutional Well developed. Well nourished.  Vascular Dorsalis pedis pulses palpable bilaterally. Posterior tibial pulses palpable bilaterally. Capillary refill normal to all digits.  No  cyanosis or clubbing noted. Pedal hair growth normal.  Neurologic Normal speech. Oriented to person, place, and time. Epicritic sensation to light touch grossly present bilaterally.  Dermatologic Nails well groomed and normal in appearance. No open wounds. No skin lesions.  Orthopedic:  Wound VAC in place at the surgical site to allow for proper healing.  Wound VAC is clean dry and intact without loss of suction.   Radiographs: 3 views of skeletally mature adult foot: Interval amputation noted at the first metatarsal.  Sharp surgical margin.  No cortical irregularity or destruction noted.  No soft tissue emphysema noted. Assessment:   1. Foot pain, left   2. Type 2 diabetes mellitus with foot ulcer, with long-term current use of insulin  ([place])   3. Peripheral vascular disease ([place])   4. Amputation of left great toe [facility])    Plan:  Patient was evaluated and treated and all questions answered.  Left partial first ray amputation done at the [facility] on 12/22/2019 -I explained to the patient the etiology of why he had to undergo a partial first ray amputation and its relationship with peripheral vascular disease as well as diabetes.  Given that patient sugars are greater than 12 he is at a high risk of losing his foot.  I have expressed my concern to lower his blood sugars to avoid undergoing further amputation if the incision site does not heal.  Today the wound VAC was not removed by me because it was primarily managed by another physician at the [facility] hospital.  I also explained and discussed with the patient the importance of vascular flow to help healing with the wound.  Patient states that he is being followed by vascular surgeon at the [facility].  I explained to the patient the importance of getting a new peripheral vascular studies to evaluate the flow to the lower extremity.  If there is a diminished flow then patient will need a intervention to increase the flow.  Patient states understanding will ask his [place] hospital system. -Patient left my office fully informed.  I did explain to the patient that the management of the wound with the wound VAC as well as the amputation is within the standard of care for the infection that he had gotten.  He states understanding.  Have asked him to come back and see me if there is any issues in the future.  For now, patient will benefit inside the [place] system for evaluation and management of the amputation site and the wound associated with it.   No follow-ups on file.

## 2021-02-15 ENCOUNTER — Inpatient Hospital Stay: Payer: Medicaid Other

## 2021-02-15 ENCOUNTER — Observation Stay
Admission: EM | Admit: 2021-02-15 | Discharge: 2021-02-16 | Disposition: A | Discharge status: Home / Self Care | Payer: Medicaid Other | Attending: Internal Medicine | Admitting: Internal Medicine

## 2021-02-15 ENCOUNTER — Emergency Department: Payer: Medicaid Other

## 2021-02-15 DIAGNOSIS — Z7982 Long term (current) use of aspirin: Secondary | ICD-10-CM | POA: Insufficient documentation

## 2021-02-15 DIAGNOSIS — Z794 Long term (current) use of insulin: Secondary | ICD-10-CM | POA: Insufficient documentation

## 2021-02-15 DIAGNOSIS — Z8616 Personal history of COVID-19: Secondary | ICD-10-CM | POA: Insufficient documentation

## 2021-02-15 DIAGNOSIS — I10 Essential (primary) hypertension: Secondary | ICD-10-CM

## 2021-02-15 DIAGNOSIS — I639 Cerebral infarction, unspecified: Principal | ICD-10-CM | POA: Insufficient documentation

## 2021-02-15 DIAGNOSIS — Z20822 Contact with and (suspected) exposure to covid-19: Secondary | ICD-10-CM | POA: Insufficient documentation

## 2021-02-15 DIAGNOSIS — E1165 Type 2 diabetes mellitus with hyperglycemia: Secondary | ICD-10-CM

## 2021-02-15 DIAGNOSIS — R531 Weakness: Secondary | ICD-10-CM | POA: Diagnosis present

## 2021-02-15 DIAGNOSIS — E669 Obesity, unspecified: Secondary | ICD-10-CM

## 2021-02-15 DIAGNOSIS — E1122 Type 2 diabetes mellitus with diabetic chronic kidney disease: Secondary | ICD-10-CM | POA: Insufficient documentation

## 2021-02-15 DIAGNOSIS — Z79899 Other long term (current) drug therapy: Secondary | ICD-10-CM | POA: Insufficient documentation

## 2021-02-15 DIAGNOSIS — I129 Hypertensive chronic kidney disease with stage 1 through stage 4 chronic kidney disease, or unspecified chronic kidney disease: Secondary | ICD-10-CM | POA: Diagnosis not present

## 2021-02-15 DIAGNOSIS — N189 Chronic kidney disease, unspecified: Secondary | ICD-10-CM | POA: Insufficient documentation

## 2021-02-15 DIAGNOSIS — G4730 Sleep apnea, unspecified: Secondary | ICD-10-CM

## 2021-02-15 DIAGNOSIS — I6381 Other cerebral infarction due to occlusion or stenosis of small artery: Secondary | ICD-10-CM

## 2021-02-15 DIAGNOSIS — N184 Chronic kidney disease, stage 4 (severe): Secondary | ICD-10-CM

## 2021-02-15 HISTORY — DX: Cerebral infarction, unspecified: I63.9

## 2021-02-15 LAB — URINE DRUG SCREEN, QUALITATIVE (ARMC ONLY)
Amphetamines, Ur Screen: NOT DETECTED
Barbiturates, Ur Screen: NOT DETECTED
Benzodiazepine, Ur Scrn: NOT DETECTED
Cannabinoid 50 Ng, Ur ~~LOC~~: NOT DETECTED
Cocaine Metabolite,Ur ~~LOC~~: NOT DETECTED
MDMA (Ecstasy)Ur Screen: NOT DETECTED
Methadone Scn, Ur: NOT DETECTED
Opiate, Ur Screen: NOT DETECTED
Phencyclidine (PCP) Ur S: NOT DETECTED
Tricyclic, Ur Screen: NOT DETECTED

## 2021-02-15 LAB — URINALYSIS, ROUTINE W REFLEX MICROSCOPIC
Bacteria, UA: NONE SEEN
Bilirubin Urine: NEGATIVE
Glucose, UA: >=500 mg/dL — AB
Hgb urine dipstick: NEGATIVE
Ketones, ur: NEGATIVE mg/dL
Leukocytes,Ua: NEGATIVE
Nitrite: NEGATIVE
Protein, ur: 100 mg/dL — AB
Specific Gravity, Urine: 1.008 (ref 1.005–1.030)
Squamous Epithelial / HPF: NONE SEEN (ref 0–5)
pH: 5 (ref 5.0–8.0)

## 2021-02-15 LAB — CBC
HCT: 37.8 % — ABNORMAL LOW (ref 39.0–52.0)
Hemoglobin: 12.3 g/dL — ABNORMAL LOW (ref 13.0–17.0)
MCH: 26 pg (ref 26.0–34.0)
MCHC: 32.5 g/dL (ref 30.0–36.0)
MCV: 79.9 fL — ABNORMAL LOW (ref 80.0–100.0)
Platelets: 237 10*3/uL (ref 150–400)
RBC: 4.73 MIL/uL (ref 4.22–5.81)
RDW: 14.5 % (ref 11.5–15.5)
WBC: 8.3 10*3/uL (ref 4.0–10.5)
nRBC: 0 % (ref 0.0–0.2)

## 2021-02-15 LAB — DIFFERENTIAL
Abs Immature Granulocytes: 0.04 10*3/uL (ref 0.00–0.07)
Basophils Absolute: 0 10*3/uL (ref 0.0–0.1)
Basophils Relative: 0 %
Eosinophils Absolute: 0.3 10*3/uL (ref 0.0–0.5)
Eosinophils Relative: 3 %
Immature Granulocytes: 1 %
Lymphocytes Relative: 18 %
Lymphs Abs: 1.5 10*3/uL (ref 0.7–4.0)
Monocytes Absolute: 0.5 10*3/uL (ref 0.1–1.0)
Monocytes Relative: 6 %
Neutro Abs: 6 10*3/uL (ref 1.7–7.7)
Neutrophils Relative %: 72 %

## 2021-02-15 LAB — COMPREHENSIVE METABOLIC PANEL
ALT: 18 U/L (ref 0–44)
AST: 31 U/L (ref 15–41)
Albumin: 4.2 g/dL (ref 3.5–5.0)
Alkaline Phosphatase: 173 U/L — ABNORMAL HIGH (ref 38–126)
Anion gap: 11 (ref 5–15)
BUN: 70 mg/dL — ABNORMAL HIGH (ref 8–23)
CO2: 24 mmol/L (ref 22–32)
Calcium: 9.1 mg/dL (ref 8.9–10.3)
Chloride: 95 mmol/L — ABNORMAL LOW (ref 98–111)
Creatinine, Ser: 4.36 mg/dL — ABNORMAL HIGH (ref 0.61–1.24)
GFR, Estimated: 15 mL/min — ABNORMAL LOW (ref >60)
Glucose, Bld: 474 mg/dL — ABNORMAL HIGH (ref 70–99)
Potassium: 5.2 mmol/L — ABNORMAL HIGH (ref 3.5–5.1)
Sodium: 130 mmol/L — ABNORMAL LOW (ref 135–145)
Total Bilirubin: 0.9 mg/dL (ref 0.3–1.2)
Total Protein: 8.2 g/dL — ABNORMAL HIGH (ref 6.5–8.1)

## 2021-02-15 LAB — GLUCOSE, CAPILLARY: Glucose-Capillary: 141 mg/dL — ABNORMAL HIGH (ref 70–99)

## 2021-02-15 LAB — CBG MONITORING, ED
Glucose-Capillary: 443 mg/dL — ABNORMAL HIGH (ref 70–99)
Glucose-Capillary: 105 mg/dL — ABNORMAL HIGH (ref 70–99)

## 2021-02-15 LAB — ETHANOL: Alcohol, Ethyl (B): <10 mg/dL (ref <10)

## 2021-02-15 LAB — APTT: aPTT: 28 seconds (ref 24–36)

## 2021-02-15 LAB — PROTIME-INR
INR: 1 (ref 0.8–1.2)
Prothrombin Time: 12.6 seconds (ref 11.4–15.2)

## 2021-02-15 IMAGING — MR MR HEAD W/O CM
11 series · 48 of 48 positions shown · non-contrast
Comparison: Noncontrast head CT [DATE].

CLINICAL DATA: Neuro deficit, acute, stroke suspected. Additional
history provided: Right-sided weakness.

EXAM:
MRI HEAD WITHOUT CONTRAST
TECHNIQUE: Multiplanar, multiecho pulse sequences of the brain and surrounding
structures were obtained without intravenous contrast.

[Series 5: ax dwi_tracew · axial · 3.0mm · 0.71mm/px · z∈[-69,+94]mm · 5 of 56 slices shown]
[im 1/56]
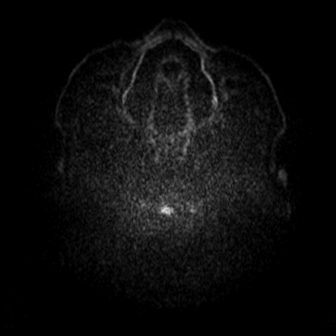
[im 14/56]
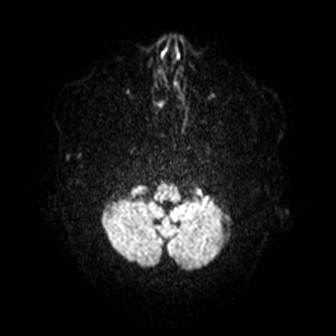
[im 28/56]
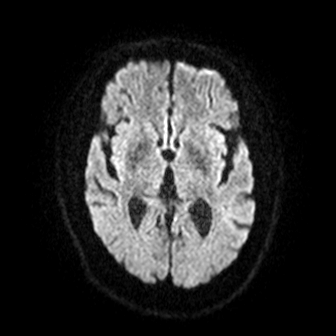
[im 42/56]
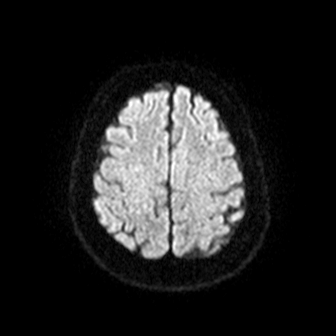
[im 56/56]
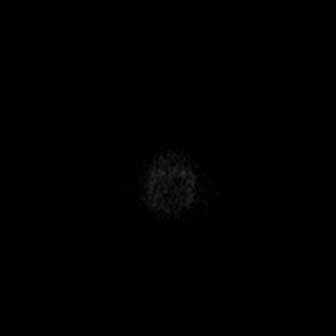

[Series 6: ax dwi_adc · axial · 3.0mm · 0.71mm/px · z∈[-69,+94]mm · 5 of 56 slices shown]
[im 1/56]
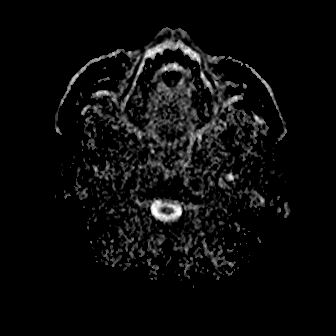
[im 14/56]
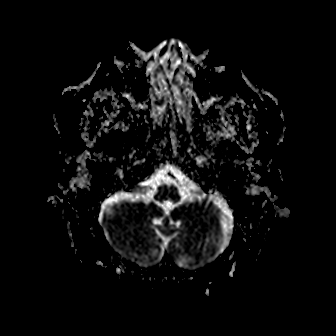
[im 28/56]
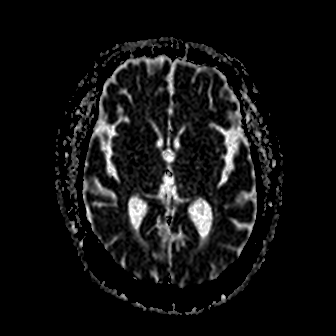
[im 42/56]
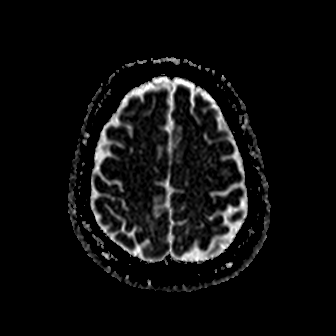
[im 56/56]
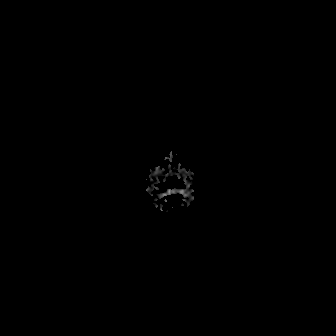

[Series 7: cor dwi_tracew · coronal · 5.0mm · 0.68mm/px · 3 of 40 slices shown]
[im 1/40]
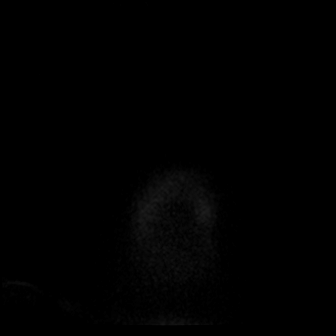
[im 20/40]
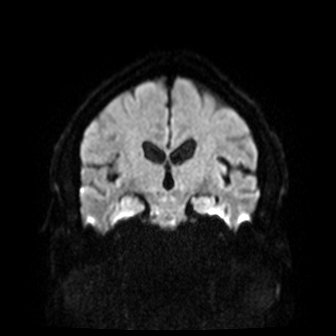
[im 40/40]
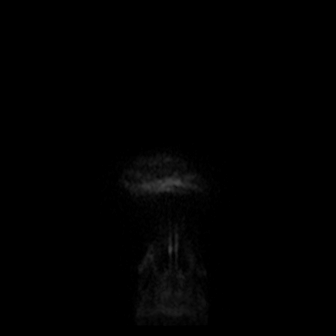

[Series 8: cor dwi_adc · coronal · 5.0mm · 0.68mm/px · 3 of 40 slices shown]
[im 1/40]
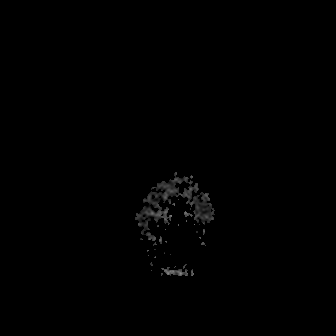
[im 20/40]
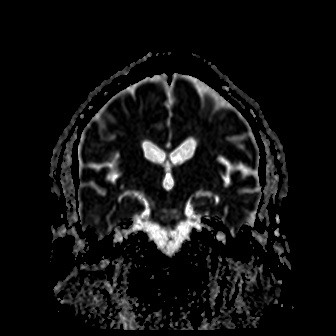
[im 40/40]
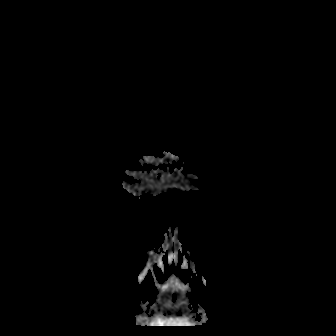

[Series 9: T1 · sagittal · 5.0mm · 0.62mm/px · 2 of 25 slices shown (1 of 2)]
[im 1/25]
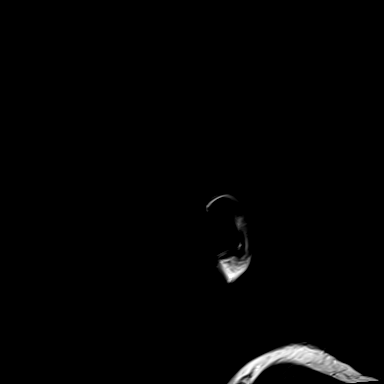
[im 25/25]
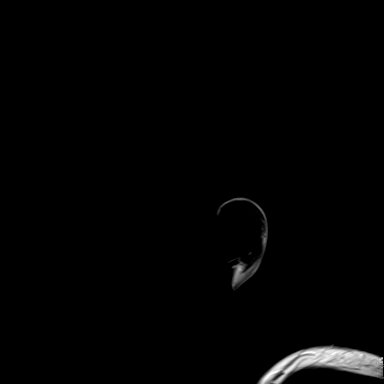

[Series 10: T2 · axial · 5.0mm · 0.69mm/px · z∈[-59,+95]mm · 2 of 27 slices shown (1 of 2)]
[im 1/27]
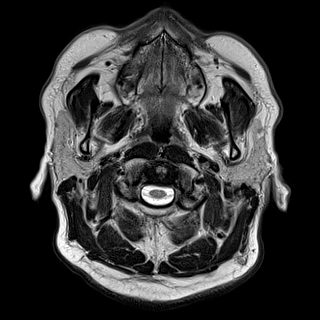
[im 27/27]
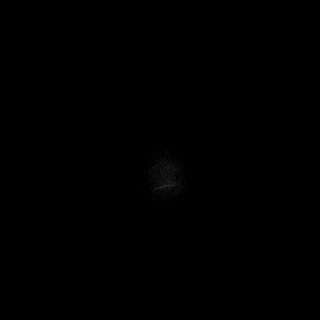

[Series 12: pha_images · axial · 3.0mm · 0.90mm/px · z∈[-57,+92]mm · 4 of 51 slices shown]
[im 1/51]
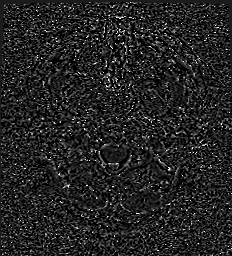
[im 17/51]
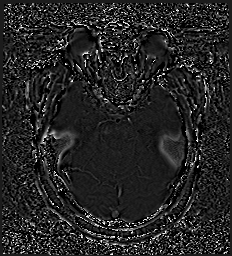
[im 34/51]
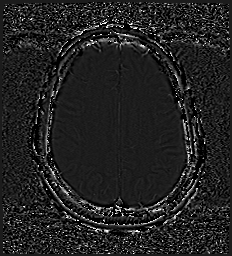
[im 51/51]
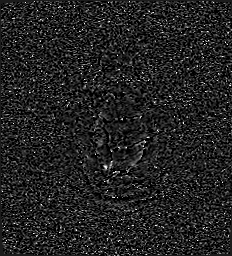

[Series 13: swi_images · axial · 3.0mm · 0.90mm/px · z∈[-57,+95]mm · 4 of 52 slices shown]
[im 1/52]
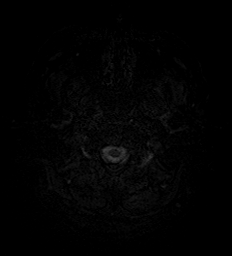
[im 18/52]
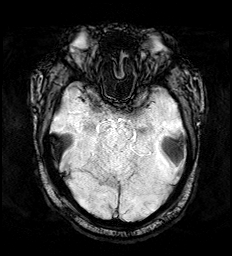
[im 35/52]
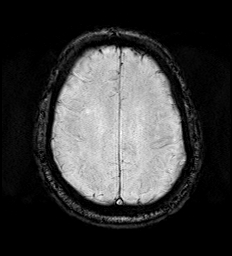
[im 52/52]
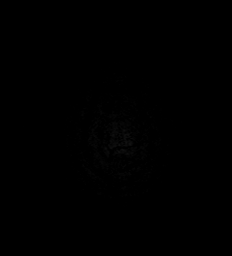

[Series 15: FLAIR · axial · 3.0mm · 0.69mm/px · z∈[-62,+98]mm · 4 of 55 slices shown]
[im 1/55]
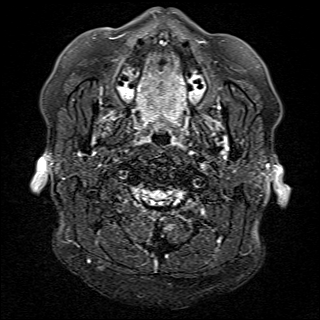
[im 19/55]
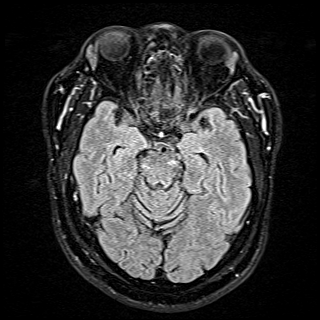
[im 37/55]
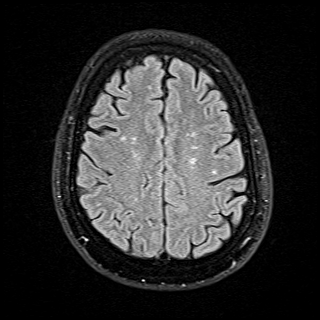
[im 55/55]
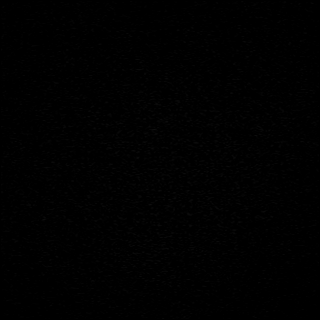

[Series 16: T1 · axial · 1.0mm · 0.98mm/px · z∈[-66,+107]mm · 14 of 173 slices shown (2 of 2)]
[im 1/173]
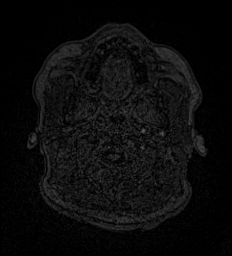
[im 14/173]
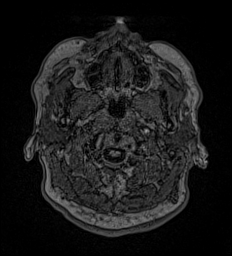
[im 27/173]
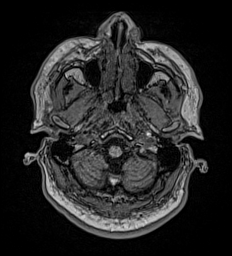
[im 40/173]
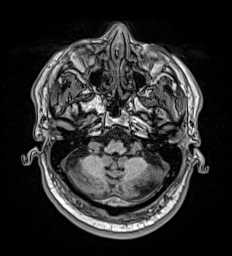
[im 53/173]
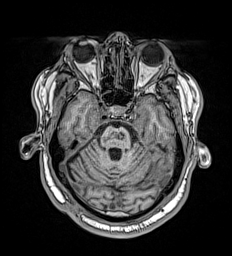
[im 67/173]
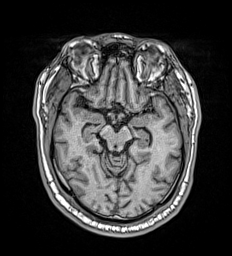
[im 80/173]
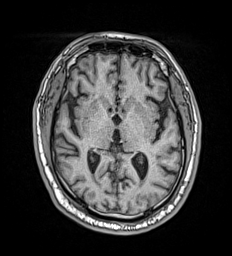
[im 93/173]
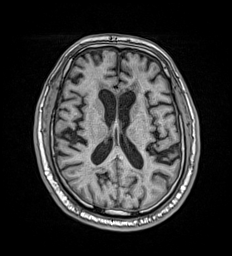
[im 106/173]
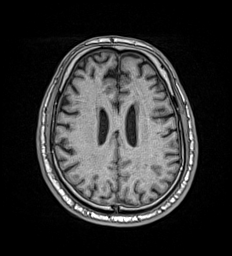
[im 120/173]
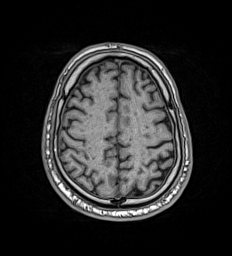
[im 133/173]
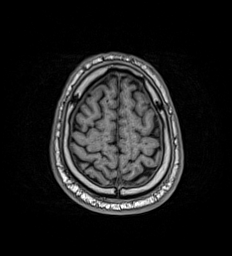
[im 146/173]
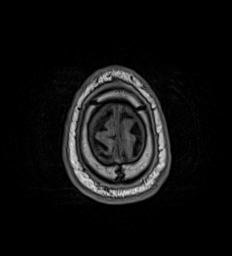
[im 159/173]
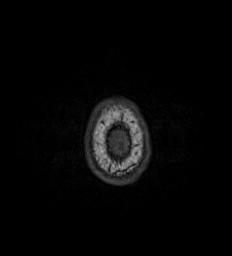
[im 173/173]
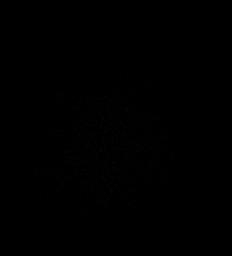

[Series 17: T2 · coronal · 5.0mm · 0.69mm/px · 2 of 29 slices shown (2 of 2)]
[im 1/29]
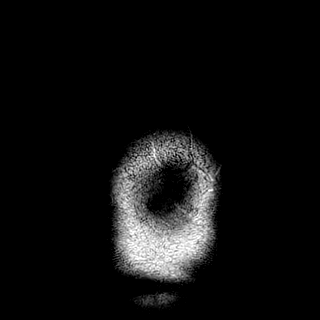
[im 29/29]
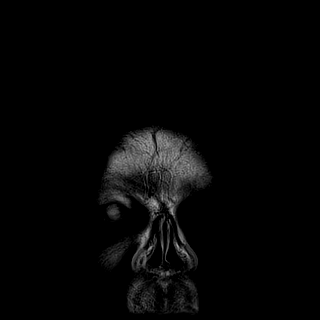

[48 of 48 positions shown; findings below may reference images not displayed]

FINDINGS: Brain:

Mild cerebral and cerebellar atrophy.

6 mm focus of restricted diffusion within the left pons compatible
with acute infarct (series 5, images 17 and 18).

Background severe chronic small vessel ischemic disease within the
pons and pontomesencephalic junction. This includes multiple chronic
lacunar infarcts within the pons (some of which have associated
chronic blood products).

Mild-to-moderate for age chronic small vessel ischemic disease
within the cerebral white matter.

Small chronic lacunar infarct within the dorsal left thalamus.

Small chronic hemorrhage within the callosal genu on the left.

No evidence of intracranial mass.

No extra-axial fluid collection.

No midline shift.

Vascular: Expected proximal arterial flow voids.

Skull and upper cervical spine: Expected proximal arterial flow
voids.

Sinuses/Orbits: Visualized orbits show no acute finding. Mild
mucosal thickening within the left frontal, bilateral ethmoid and
bilateral sphenoid sinuses.
IMPRESSION: 6 mm acute infarct within the left pons.

Background severe chronic small vessel ischemic disease within the
pons and pontomesencephalic junction.

Mild-to-moderate cerebral white matter chronic small vessel ischemic
disease.

Chronic left thalamic lacunar infarct.

Small chronic hemorrhage within the callosal genu on the left.

Mild generalized parenchymal atrophy.

Mild paranasal sinus disease as described.

## 2021-02-15 IMAGING — CT CT HEAD CODE STROKE
3 series · 15 of 47 positions shown, 18 images · non-contrast
Comparison: None.

CLINICAL DATA: Code stroke.  Right-sided weakness.

EXAM:
CT HEAD WITHOUT CONTRAST
TECHNIQUE: Contiguous axial images were obtained from the base of the skull
through the vertex without intravenous contrast.

[Series 3: head wo · axial · 0.45mm/px · z∈[-74,+66]mm · 9 of 34 slices shown, 12 images]
[im 3/34  brain]
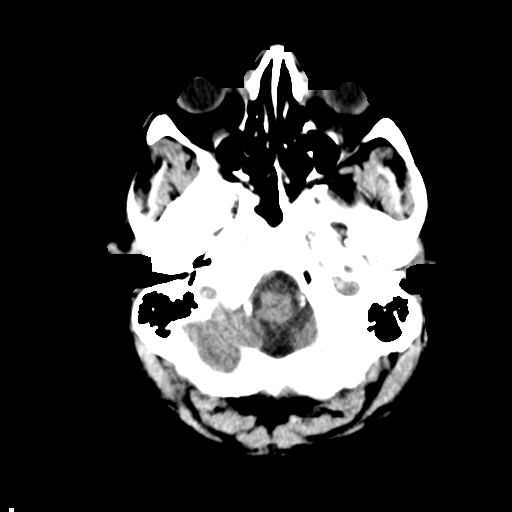
[im 3/34  bone]
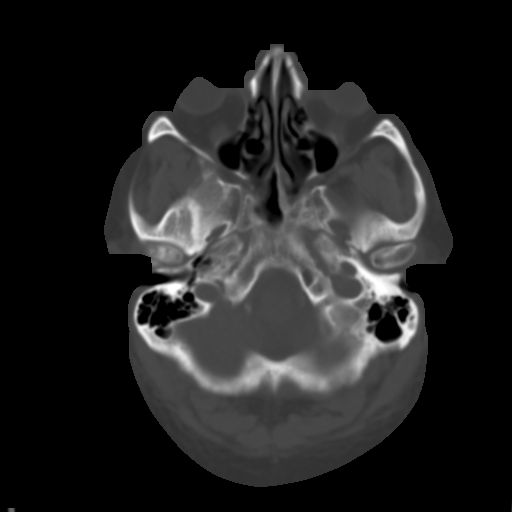
[im 6/34  brain]
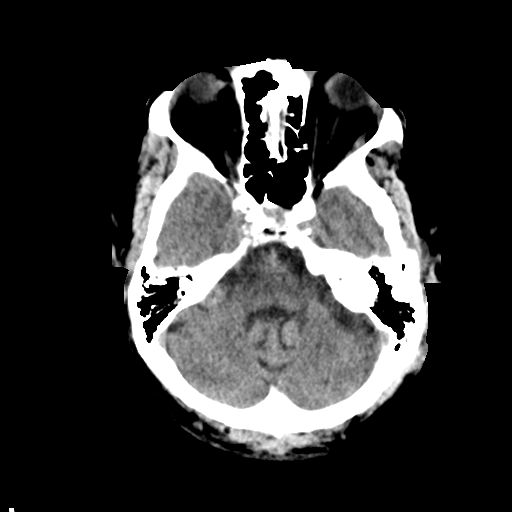
[im 10/34  brain]
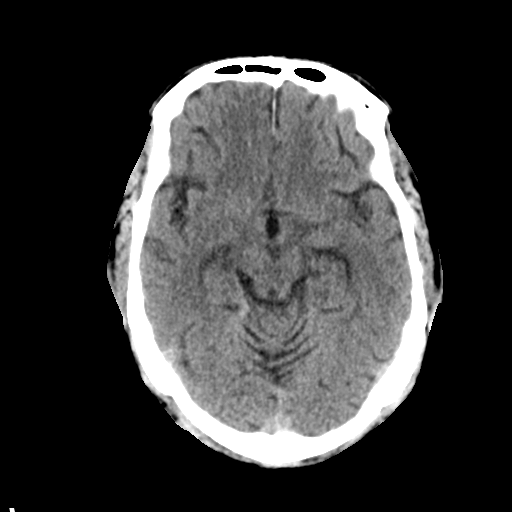
[im 13/34  brain]
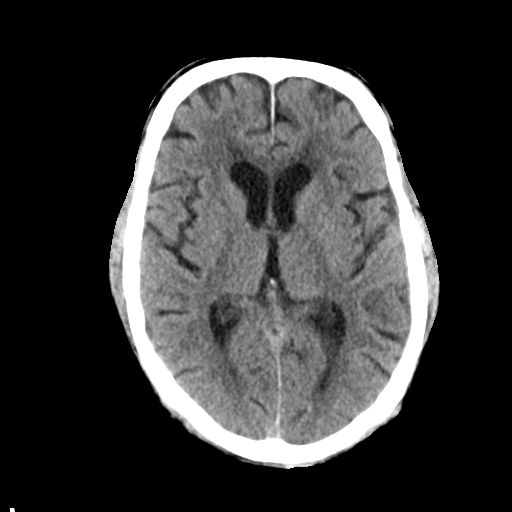
[im 18/34  brain]
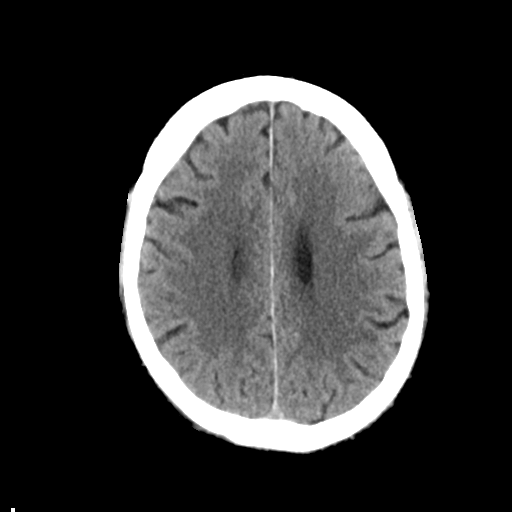
[im 18/34  bone]
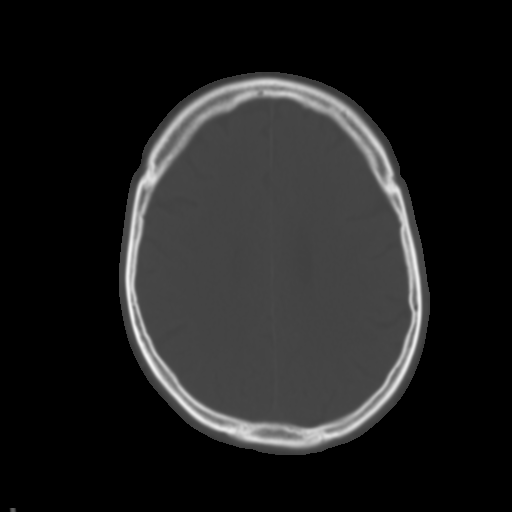
[im 21/34  brain]
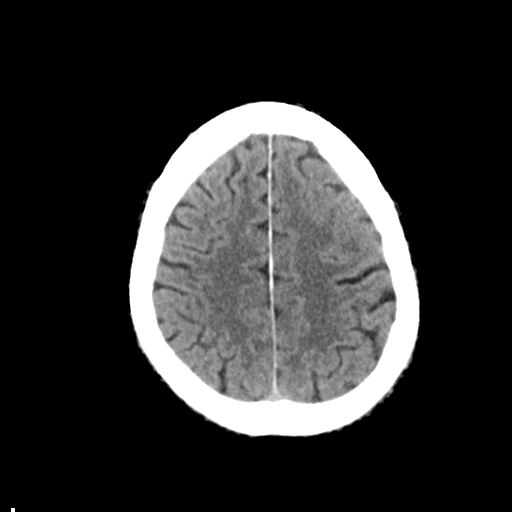
[im 24/34  brain]
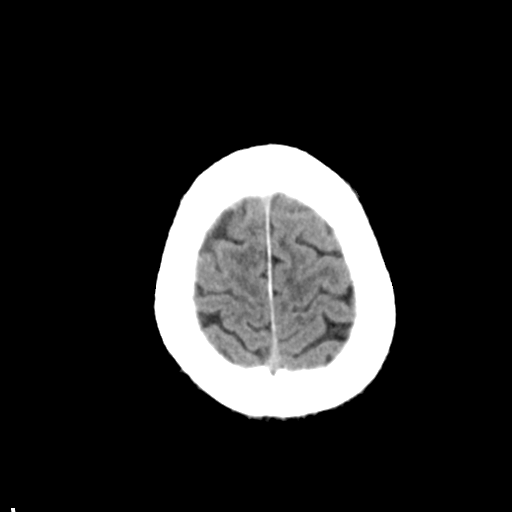
[im 28/34  brain]
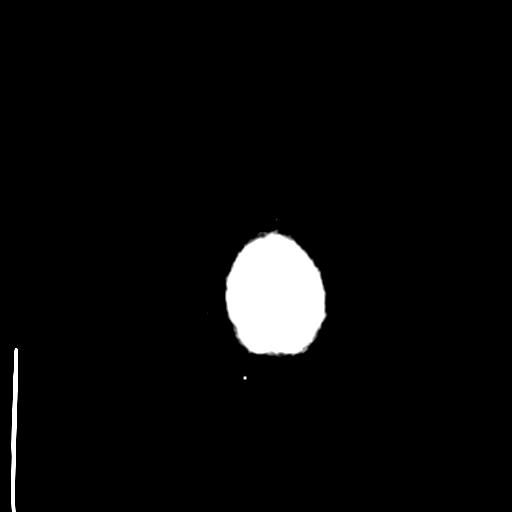
[im 31/34  brain]
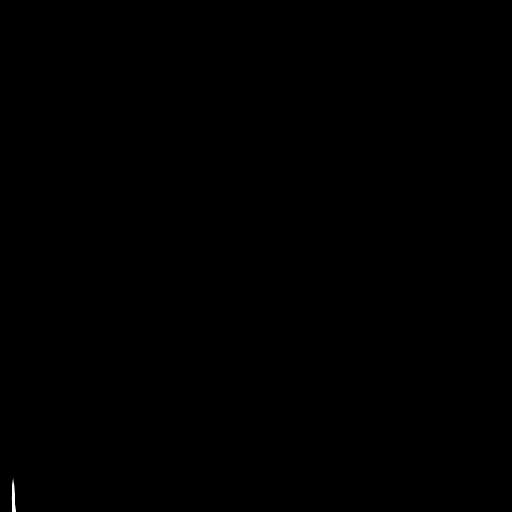
[im 31/34  bone]
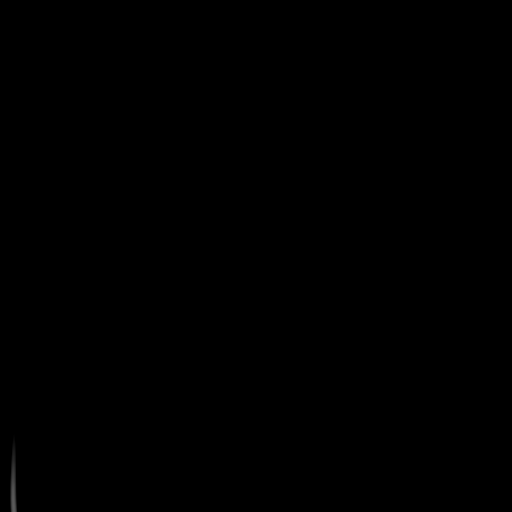

[Series 5: coronal soft tissue · coronal · 0.31mm/px · 3 of 67 slices shown]
[im 23/67  brain]
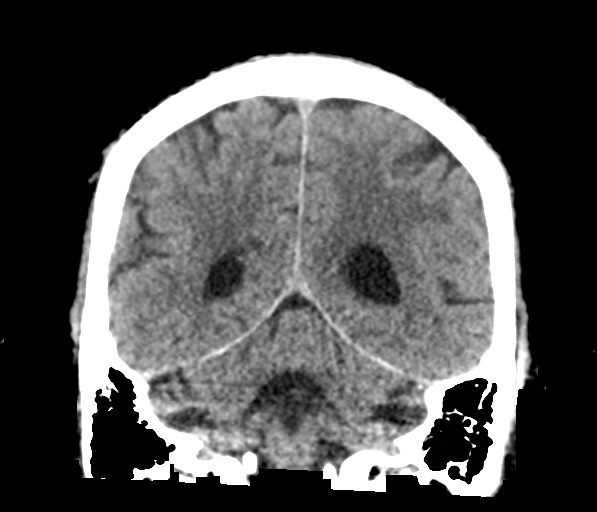
[im 30/67  brain]
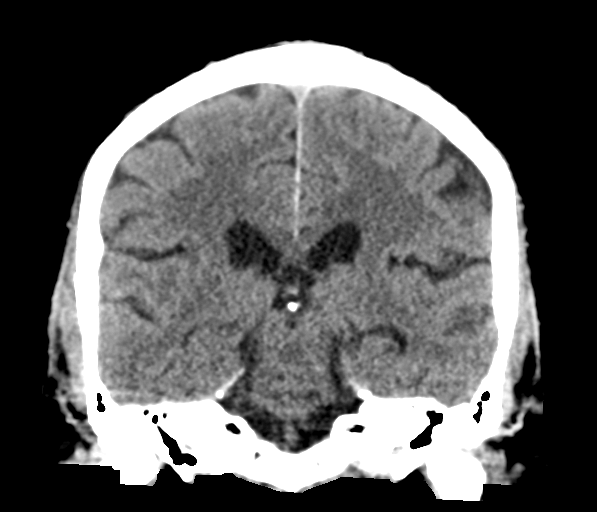
[im 37/67  brain]
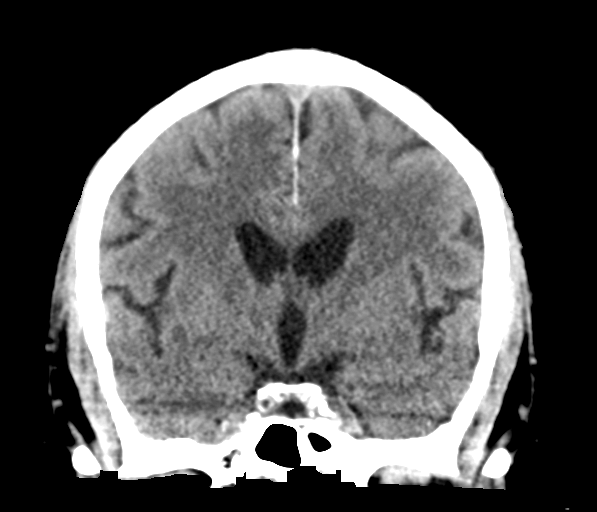

[Series 6: sagittal soft tissue · sagittal · 0.31mm/px · 3 of 57 slices shown]
[im 19/57  brain]
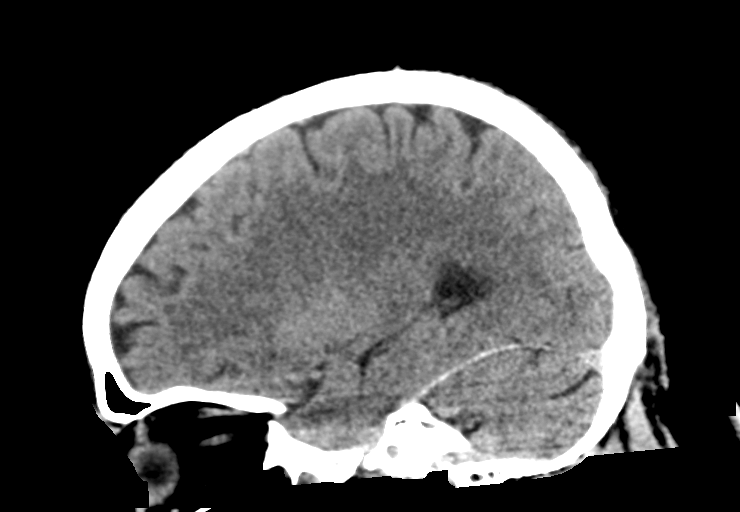
[im 29/57  brain]
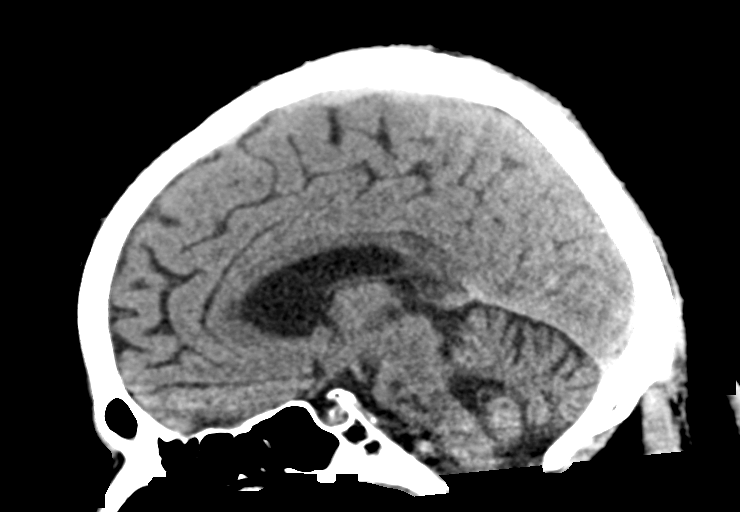
[im 38/57  brain]
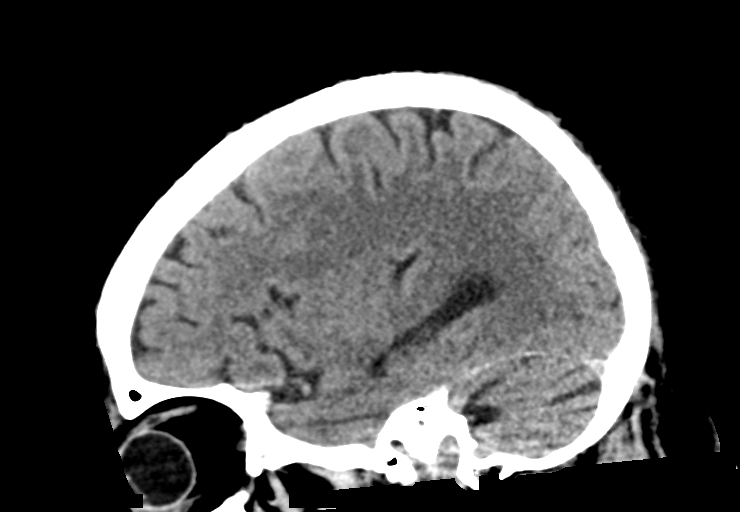

[15 of 47 positions shown; findings below may reference images not displayed]

FINDINGS: Brain: Mild atrophy. Mild white matter hypodensity bilaterally. Mild
hypodensity in the pons likely chronic.

Negative for acute infarct, hemorrhage, mass.

Vascular: Negative for hyperdense vessel

Skull: Negative

Sinuses/Orbits: Negative

Other: None

ASPECTS ([place]ber[place] Stroke Program Early CT Score)

- Ganglionic level infarction (caudate, lentiform nuclei, internal
capsule, insula, M1-M3 cortex): 7

- Supraganglionic infarction (M4-M6 cortex): 3

Total score (0-10 with 10 being normal): 10
IMPRESSION: 1. No acute abnormality
2. ASPECTS is 10
3. Code stroke imaging results were communicated on [DATE] at
[DATE] to provider [facility] via text page

## 2021-02-15 IMAGING — US US CAROTID DUPLEX BILAT
1 series · 13 of 24 positions shown · non-contrast
Comparison: None available.

CLINICAL DATA: Initial evaluation for acute stroke symptoms.

EXAM:
BILATERAL CAROTID DUPLEX ULTRASOUND
TECHNIQUE: Gray scale imaging, color Doppler and duplex ultrasound were
performed of bilateral carotid and vertebral arteries in the neck.

[Series 1: us carotid bilateral · 13 of 66 slices shown]
[im 1/66]
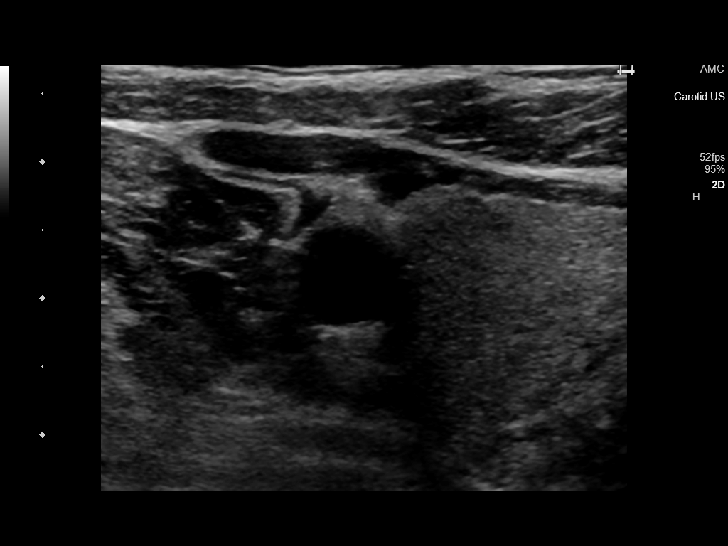
[im 6/66]
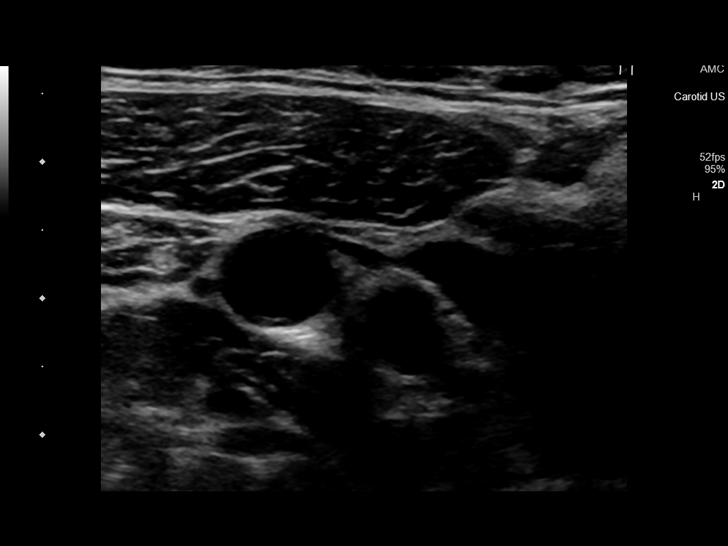
[im 12/66]
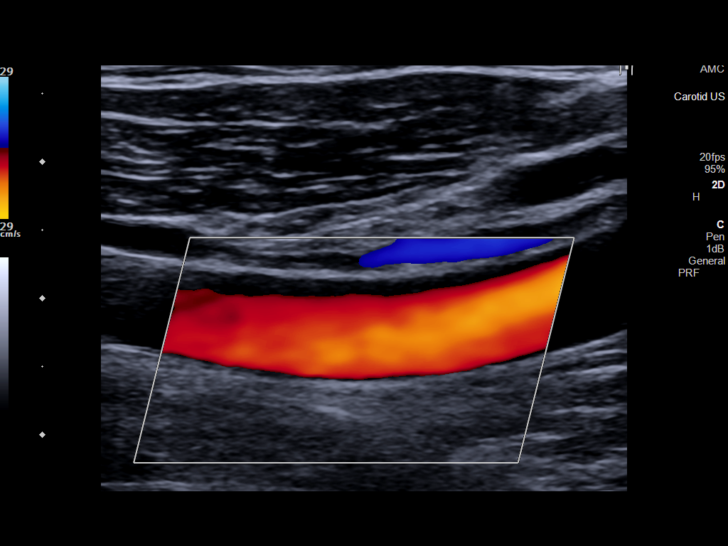
[im 17/66]
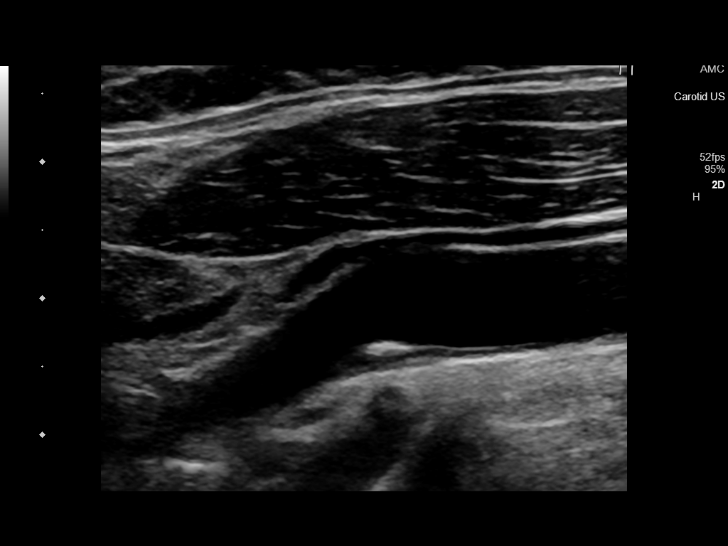
[im 23/66]
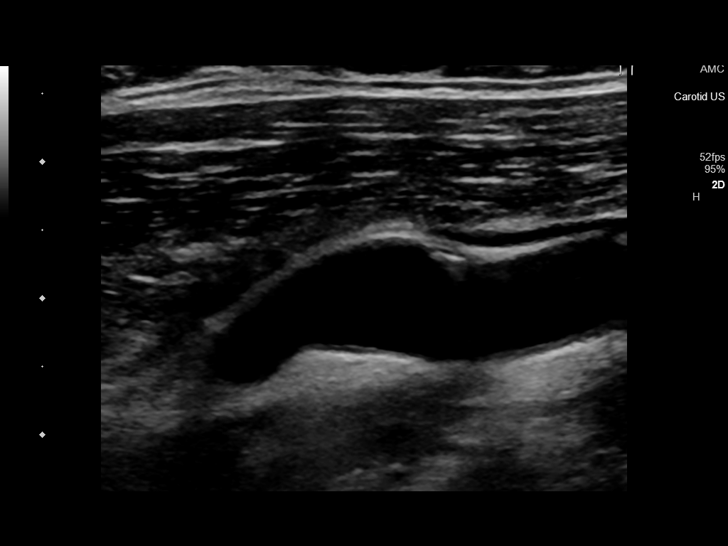
[im 29/66]
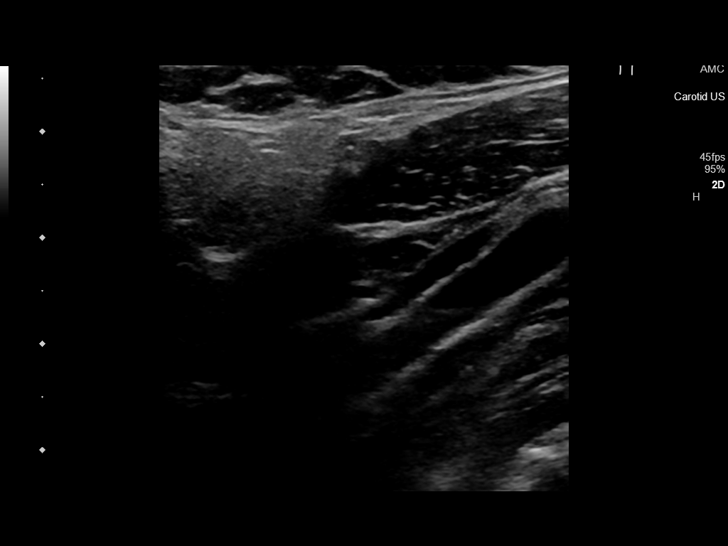
[im 34/66]
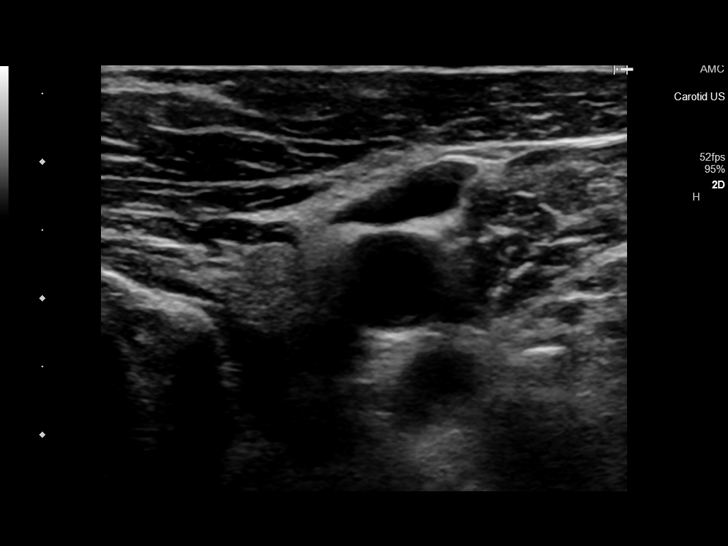
[im 37/66]
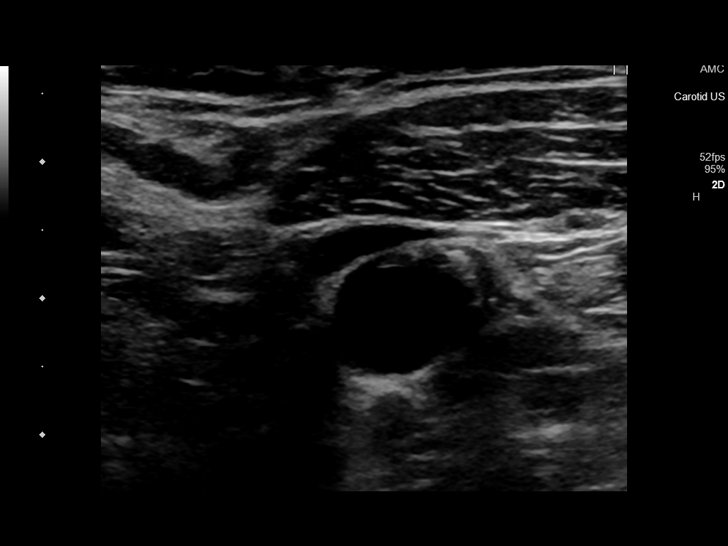
[im 43/66]
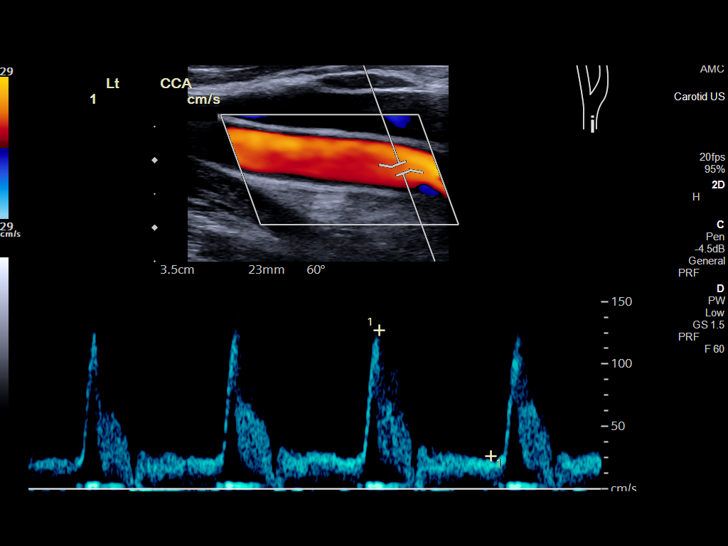
[im 49/66]
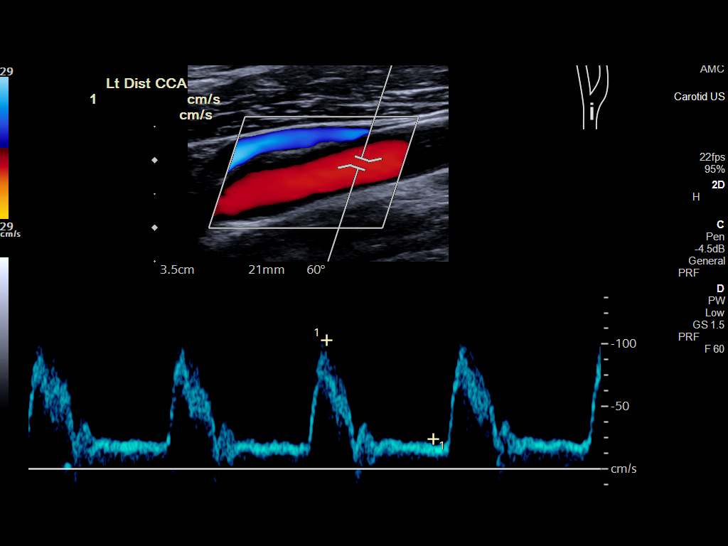
[im 54/66]
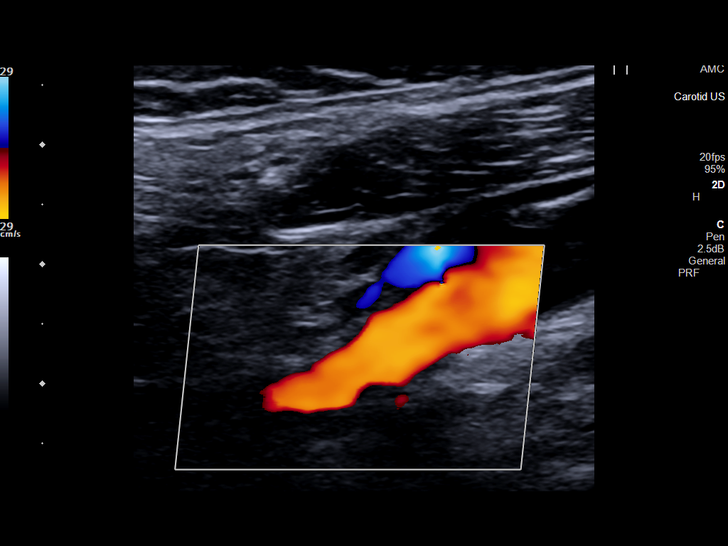
[im 60/66]
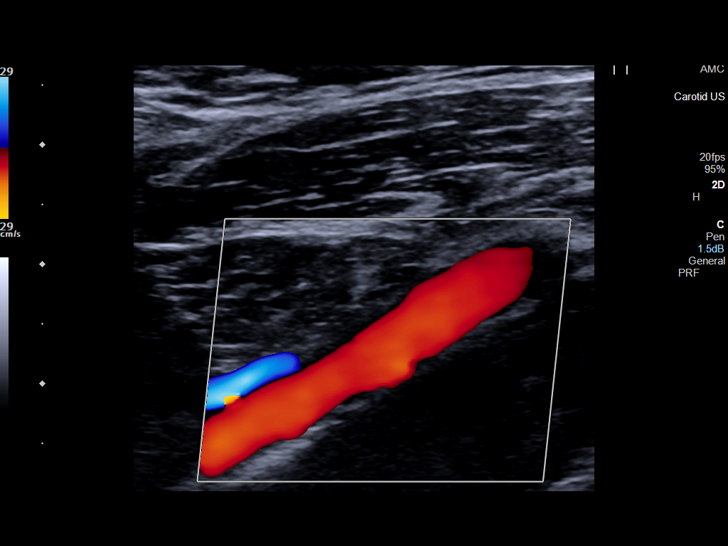
[im 66/66]
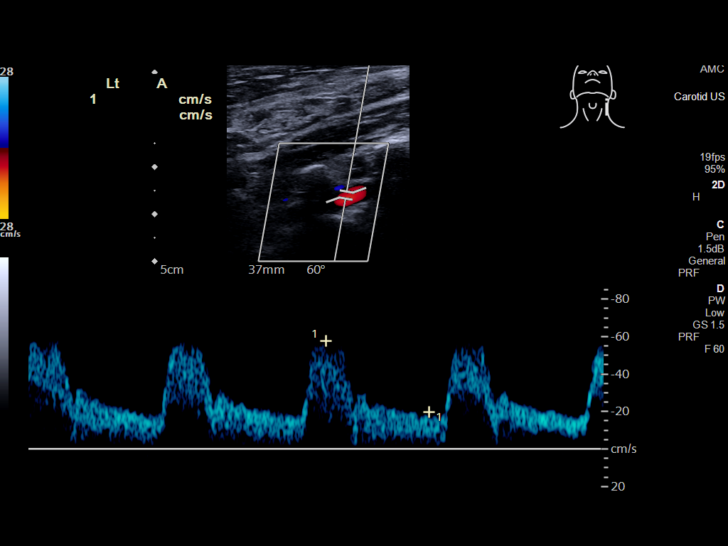

[13 of 24 positions shown; findings below may reference images not displayed]

FINDINGS: Criteria: Quantification of carotid stenosis is based on velocity
parameters that correlate the residual internal carotid diameter
with NASCET-based stenosis levels, using the diameter of the distal
internal carotid lumen as the denominator for stenosis measurement.

The following velocity measurements were obtained:

RIGHT

ICA: 75/26 cm/sec

CCA: 79/14 cm/sec

SYSTOLIC ICA/CCA RATIO:

ECA: 127 cm/sec

LEFT

ICA: 95/22 cm/sec

CCA: 103/24 cm/sec

SYSTOLIC ICA/CCA RATIO:

ECA: 114 cm/sec

RIGHT CAROTID ARTERY: Relatively mild intimal thickening within the
visualized right CCA without significant stenosis. Intimal
thickening with mild echogenic plaque seen about the right carotid
bulb. No significant elevation in peak systolic velocity to suggest
hemodynamically significant stenosis. Visualized right ICA widely
patent distally.

RIGHT VERTEBRAL ARTERY:  Antegrade.

LEFT CAROTID ARTERY: Mild intimal thickening within the visualized
left CCA without significant stenosis. Intimal thickening with
heterogeneous echogenic plaque about the left carotid bulb, slightly
more pronounced as compared to the contralateral right ICA. No
significant elevation in peak systolic velocity to suggest
hemodynamically significant stenosis however. Visualized left ICA
patent distally without significant stenosis.

LEFT VERTEBRAL ARTERY:  Antegrade.
IMPRESSION: 1. Intimal thickening with heterogeneous plaque about the bilateral
carotid bulbs, left greater than right, which results in less than
50% stenosis of the bilateral internal carotid arteries by Doppler
criteria. No hemodynamically significant stenosis identified within
the neck.
2. Patent vertebral arteries with antegrade flow bilaterally.

## 2021-02-15 IMAGING — MR MR MRA HEAD W/O CM
1 series · 19 of 48 positions shown · non-contrast
Comparison: Previous MRI from earlier the same day.

CLINICAL DATA: Follow-up examination for acute stroke.

EXAM:
MRA HEAD WITHOUT CONTRAST
TECHNIQUE: Angiographic images of the Circle of Willis were obtained using MRA
technique without intravenous contrast.

[Series 5: TOF · axial · 0.5mm · 0.41mm/px · z∈[-45,+52]mm · 19 of 205 slices shown]
[im 1/205]
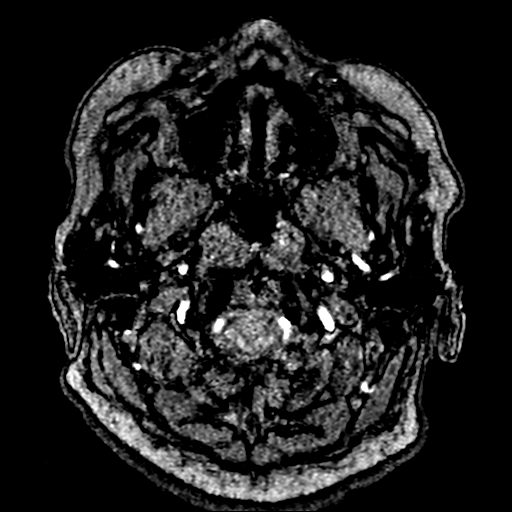
[im 5/205]
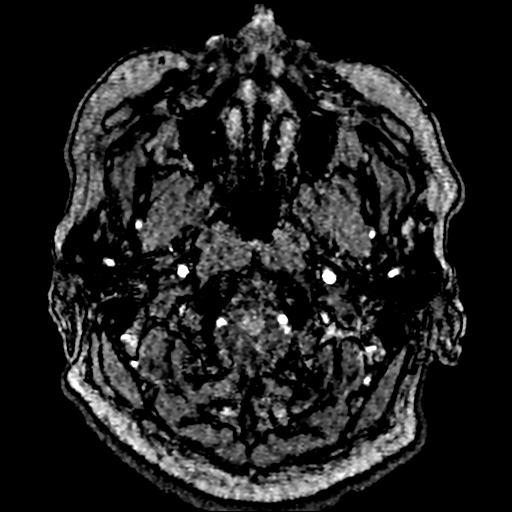
[im 9/205]
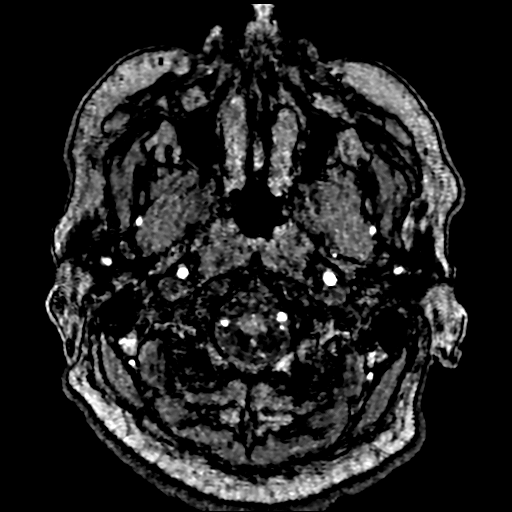
[im 14/205]
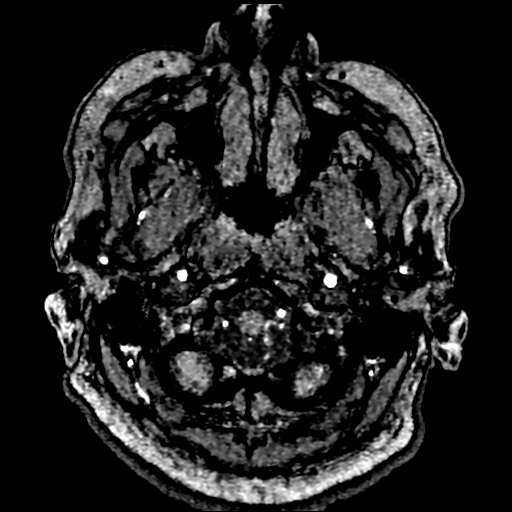
[im 18/205]
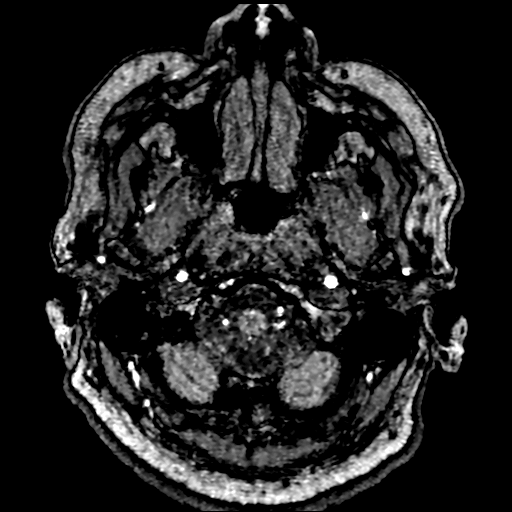
[im 22/205]
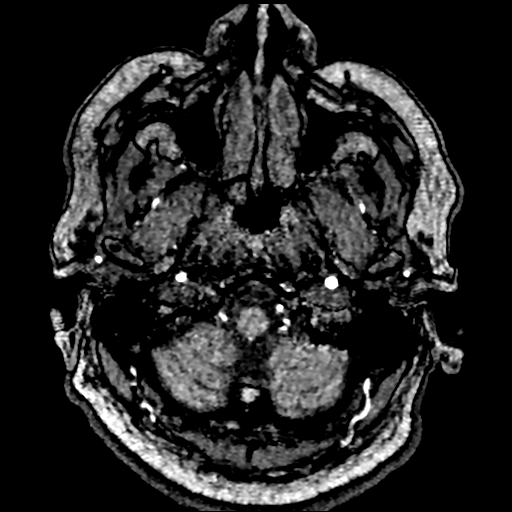
[im 27/205]
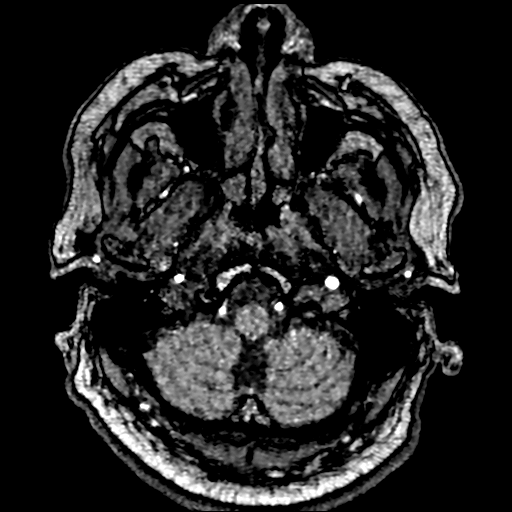
[im 31/205]
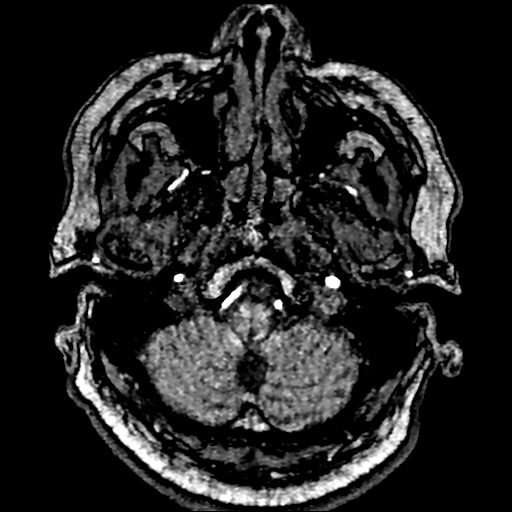
[im 35/205]
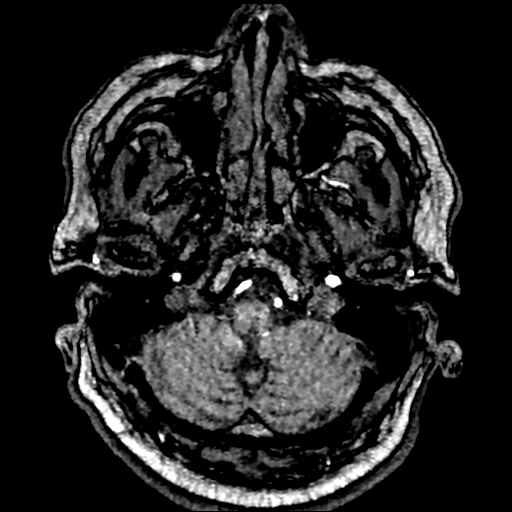
[im 40/205]
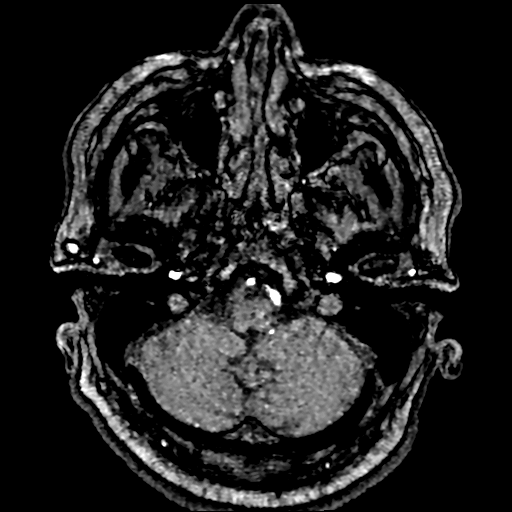
[im 44/205]
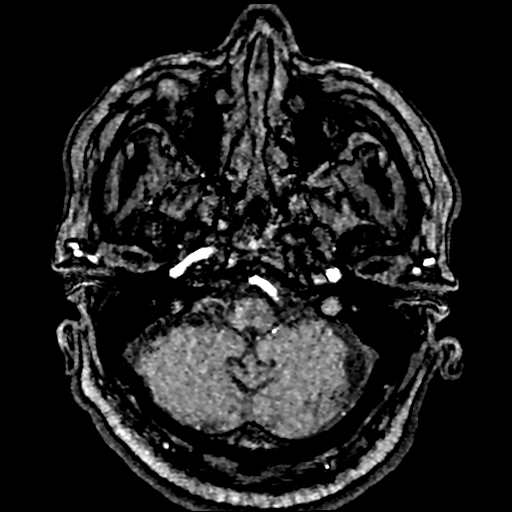
[im 66/205]
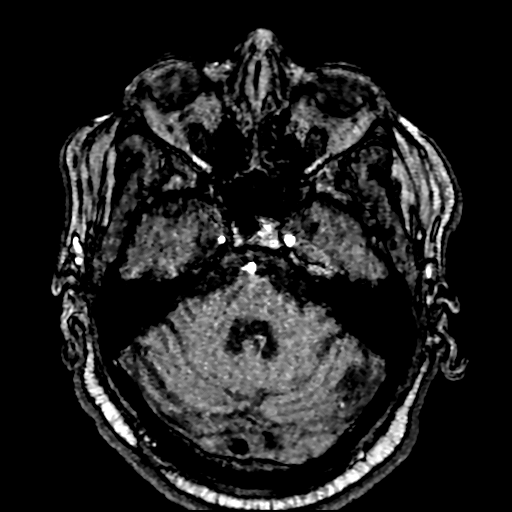
[im 92/205]
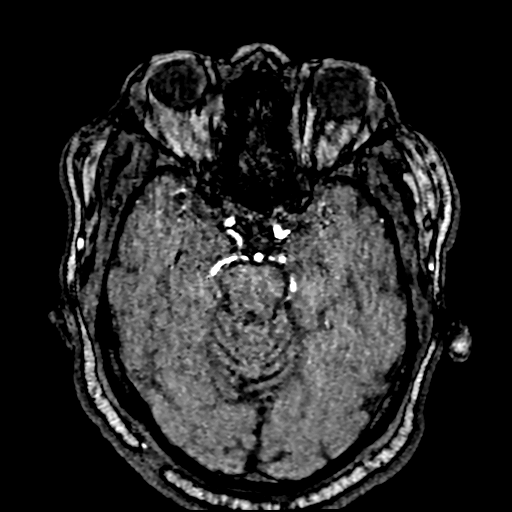
[im 105/205]
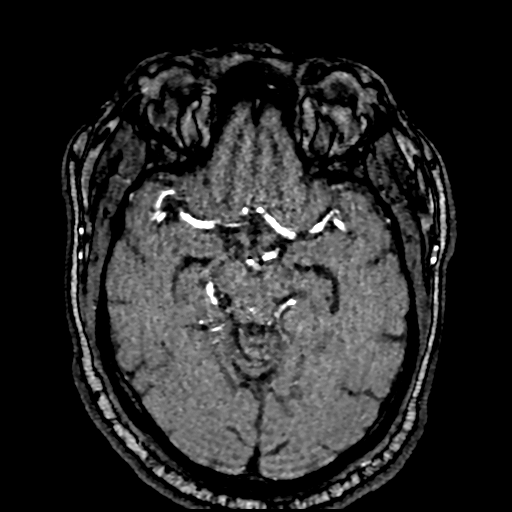
[im 118/205]
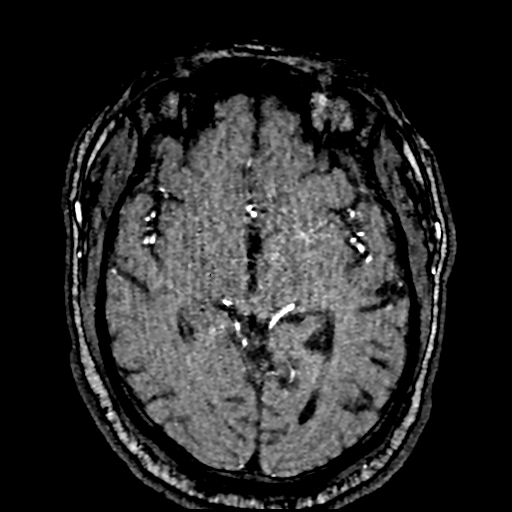
[im 144/205]
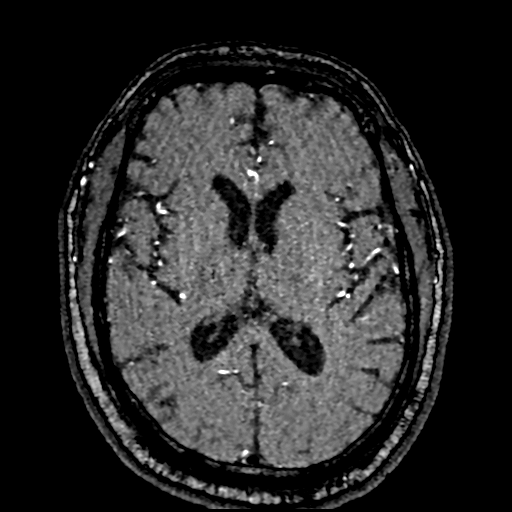
[im 170/205]
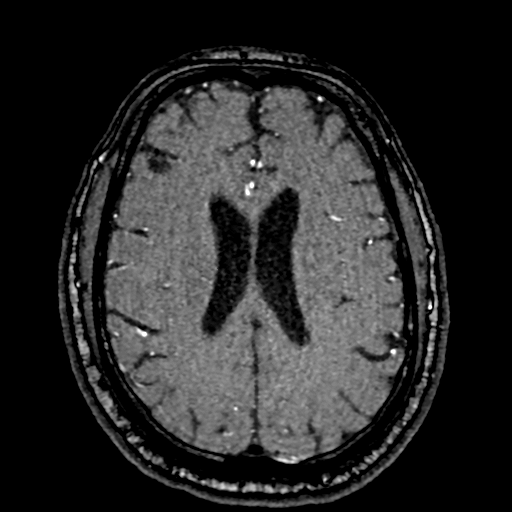
[im 174/205]
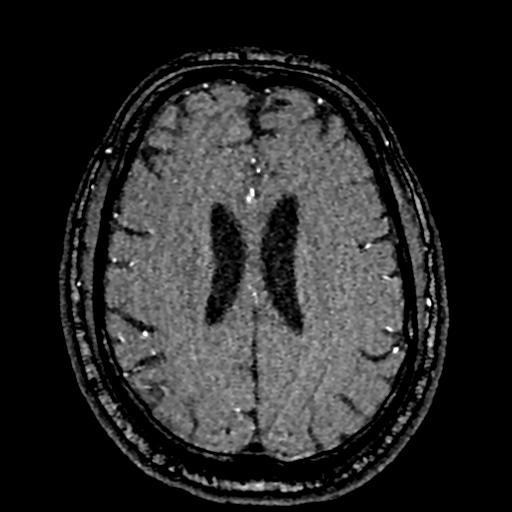
[im 196/205]
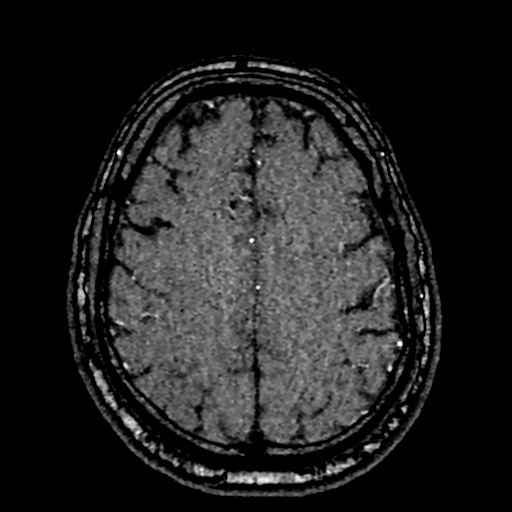

[19 of 48 positions shown; findings below may reference images not displayed]

FINDINGS: ANTERIOR CIRCULATION:

Examination degraded by motion artifact.

Visualized distal cervical segments of the internal carotid arteries
are patent with antegrade flow. Petrous segments patent bilaterally.
Extensive atheromatous irregularity throughout the carotid siphons.
Associated short-segment severe stenosis at the cavernous left ICA
(series 5, image 78). More moderate irregular stenosis seen at the
cavernous right ICA (series [ZH], image 4). ICA termini well
perfused bilaterally. Left A1 segment patent. Right A1 hypoplastic
and/or absent, accounting for the diminutive right ICA is compared
to the left. Normal anterior communicating artery complex. Anterior
cerebral arteries patent to their distal aspects without high-grade
stenosis. No M1 stenosis or occlusion. Normal MCA bifurcations.
Distal MCA branches well perfused and symmetric.

POSTERIOR CIRCULATION:

Both V4 segments patent to the vertebrobasilar junction without
stenosis. Left V4 slightly dominant. Left PICA patent. Right PICA
not visualized. Basilar patent to its distal aspect without
stenosis. Superior cerebellar arteries patent bilaterally. Both PCAs
primarily supplied via the basilar. PCAs well perfused to their
distal aspects without stenosis.

No visible aneurysm.
IMPRESSION: 1. Negative intracranial MRA for large vessel occlusion.
2. Extensive atheromatous irregularity throughout the carotid
siphons with associated moderate to severe multifocal stenoses as
above, left worse than right.
3. Hypoplastic and/or absent right A1 segment, with the ACAs
supplied primarily via the left carotid artery system.
4. Wide patency of the posterior circulation.

## 2021-02-15 MED ORDER — SODIUM ZIRCONIUM CYCLOSILICATE 10 G PO PACK
10.0000 g | PACK | Freq: Once | ORAL | Status: AC
  Administered 2021-02-16: 10 g via ORAL
  Filled 2021-02-15 (×2): qty 1

## 2021-02-15 MED ORDER — TRAZODONE HCL 50 MG PO TABS
50.0000 mg | ORAL_TABLET | Freq: Every evening | ORAL | Status: DC | PRN
  Administered 2021-02-16: 50 mg via ORAL
  Filled 2021-02-15: qty 1

## 2021-02-15 MED ORDER — ESCITALOPRAM OXALATE 10 MG PO TABS
20.0000 mg | ORAL_TABLET | Freq: Every day | ORAL | Status: DC
  Administered 2021-02-16: 01:00:00 20 mg via ORAL
  Filled 2021-02-15 (×2): qty 2

## 2021-02-15 MED ORDER — ONDANSETRON HCL 4 MG/2ML IJ SOLN: 4.0000 mg | Freq: Four times a day (QID) | INTRAMUSCULAR | Status: DC | PRN

## 2021-02-15 MED ORDER — PANTOPRAZOLE SODIUM 40 MG PO TBEC: 40.0000 mg | DELAYED_RELEASE_TABLET | Freq: Every day | ORAL | Status: DC

## 2021-02-15 MED ORDER — CLOPIDOGREL BISULFATE 75 MG PO TABS
75.0000 mg | ORAL_TABLET | Freq: Every day | ORAL | Status: DC
  Administered 2021-02-16: 75 mg via ORAL
  Filled 2021-02-15: qty 1

## 2021-02-15 MED ORDER — INSULIN ASPART 100 UNIT/ML ~~LOC~~ SOLN
5.0000 [IU] | Freq: Once | SUBCUTANEOUS | Status: AC
  Administered 2021-02-15: 5 [IU] via INTRAVENOUS
  Filled 2021-02-15: qty 1

## 2021-02-15 MED ORDER — CLOPIDOGREL BISULFATE 75 MG PO TABS
300.0000 mg | ORAL_TABLET | Freq: Once | ORAL | Status: AC
  Administered 2021-02-15: 300 mg via ORAL
  Filled 2021-02-15: qty 4

## 2021-02-15 MED ORDER — ASPIRIN EC 81 MG PO TBEC
81.0000 mg | DELAYED_RELEASE_TABLET | Freq: Every day | ORAL | Status: DC
  Administered 2021-02-16: 10:00:00 81 mg via ORAL
  Filled 2021-02-15: qty 1

## 2021-02-15 MED ORDER — ACETAMINOPHEN 325 MG PO TABS: 650.0000 mg | ORAL_TABLET | Freq: Four times a day (QID) | ORAL | Status: DC | PRN

## 2021-02-15 MED ORDER — ATORVASTATIN CALCIUM 20 MG PO TABS
80.0000 mg | ORAL_TABLET | Freq: Every day | ORAL | Status: DC
  Administered 2021-02-16: 01:00:00 80 mg via ORAL
  Filled 2021-02-15: qty 4

## 2021-02-15 MED ORDER — SODIUM CHLORIDE 0.9 % IV BOLUS
500.0000 mL | Freq: Once | INTRAVENOUS | Status: AC
  Administered 2021-02-15: 500 mL via INTRAVENOUS

## 2021-02-15 NOTE — Progress Notes (Signed)
CODE STROKE- PHARMACY COMMUNICATION   Time CODE STROKE called/page received:1410  Time response to CODE STROKE was made (in person or via phone): 1411   Name of Provider/Nurse contacted: Marjean Donna  A/P: Code stroke paged d/t concern of right sided weakness. LKW at 1900 on 3/17. Was outside of tPA window, so medication was not prepared.  Past Medical History:  Diagnosis Date  . Diabetes mellitus without complication ([place])   . Hypertension   . Neuropathy   . Renal disorder   . Stroke [facility])    2016   Prior to Admission medications   Medication Sig Start Date End Date Taking? Authorizing Provider  amLODipine (NORVASC) 10 MG tablet Take 10 mg by mouth daily.    [provider]  aspirin EC 81 MG tablet Take 81 mg by mouth daily.    [provider]  atorvastatin (LIPITOR) 80 MG tablet Take 80 mg by mouth at bedtime.    [provider]  Calcium Acetate 667 MG TABS Take 667 mg by mouth 3 (three) times daily with meals.    [provider]  cholecalciferol (VITAMIN D) 25 MCG (1000 UT) tablet Take 2,000 Units by mouth daily.    [provider]  empagliflozin (JARDIANCE) 10 MG TABS tablet Take 10 mg by mouth daily.    [provider]  furosemide (LASIX) 40 MG tablet Take 40 mg by mouth daily.    [provider]  Insulin Glargine (LANTUS SOLOSTAR) 100 UNIT/ML Solostar Pen Inject 30 Units into the skin every 12 (twelve) hours.    [provider]  Melatonin 5 MG TABS Take 5 mg by mouth at bedtime as needed (sleep).    [provider]  metoprolol tartrate (LOPRESSOR) 50 MG tablet Take 50 mg by mouth 2 (two) times daily.    [provider]  Multiple Vitamin (MULTIVITAMIN WITH MINERALS) TABS tablet Take 1 tablet by mouth daily.    [provider]  omeprazole (PRILOSEC OTC) 20 MG tablet Take 20 mg by mouth daily.    [provider]    Pearla Dubonnet ,PharmD Clinical Pharmacist  02/15/2021   4:25 PM

## 2021-02-15 NOTE — ED Notes (Signed)
Pt presents to ED as a code stroke with concerns of R sided weakness. Pt states HX of stroke in 2016 and states it feels similar. Pt presents with a CBG in the 400's. Pt is A&Ox4 at this time. NAD noted. Pt states this morning he felt weak and states he legs gave out and he fell at the kitchen sink. Pt denies injury or trauma.

## 2021-02-15 NOTE — H&P (Signed)
History and Physical  Bobby Sampson [id number] DOB: 26-Feb-1961 DOA: 02/15/2021  Referring physician: Dr. Ellender Hose, [place] PCP: [facility] [place]  Outpatient Specialists: Nephrology, cardiology from [facility]. Patient coming from: Home.  Chief Complaint: Right-sided weakness.  HPI: Bobby Sampson is a 60 y.o. male with medical history significant for essential hypertension, type 2 diabetes, prior CVA, ESRD on renal transplant list and transitioning to hemodialysis, hyperlipidemia, who presented to [facility] ED due to right sided weakness.  Last known well was around 7 PM on 02/14/2021.  Noticed right-sided weakness but he thought it was secondary to his elevated blood sugar which was in the 600s.  This morning he noted that he was dragging his right leg and realized that his symptoms were not going away and that he could be having a stroke.  He decided to present to the ED for further evaluation.  While in the ED, MRI brain showed small acute left pontine infarct.  He was seen by neurology/stroke team who recommended admission for stroke work-up.  Was outside TPA window.  ED Course:  Afebrile, BP is 144/78, pulse 75, respiratory rate 15, O2 saturation 97% on room air.  Lab studies remarkable for serum sodium 130, serum potassium 5.2, serum glucose 174, BUN 70, creatinine 4.3, alkaline phosphatase 173.  Review of Systems: Review of systems as noted in the HPI. All other systems reviewed and are negative.   Past Medical History:  Diagnosis Date  . Diabetes mellitus without complication ([place])   . Hypertension   . Neuropathy   . Renal disorder   . Stroke [facility])    2016   Past Surgical History:  Procedure Laterality Date  . TONSILLECTOMY      Social History:  reports that he has never smoked. He has never used smokeless tobacco. He reports previous alcohol use. He reports previous drug use.   Allergies  Allergen Reactions  . Trulicity [Dulaglutide] Nausea And Vomiting  . Liraglutide Nausea And  Vomiting    Other reaction(s): Nausea and vomiting, Fatigue   Family history: Prostate cancer in the father. Type 2 diabetes in his mother.  Prior to Admission medications   Medication Sig Start Date End Date Taking? Authorizing Provider  amLODipine (NORVASC) 10 MG tablet Take 10 mg by mouth daily.   Yes [provider]  aspirin EC 81 MG tablet Take 81 mg by mouth daily.   Yes [provider]  atorvastatin (LIPITOR) 80 MG tablet Take 80 mg by mouth daily.   Yes [provider]  Calcium Acetate 667 MG TABS Take 667 mg by mouth 3 (three) times daily with meals.   Yes [provider]  carvedilol (COREG) 6.25 MG tablet Take 6.25 mg by mouth 2 (two) times daily with a meal.   Yes [provider]  cholecalciferol (VITAMIN D) 25 MCG (1000 UT) tablet Take 2,000 Units by mouth daily.   Yes [provider]  escitalopram (LEXAPRO) 20 MG tablet Take 20 mg by mouth daily. 01/10/21  Yes [provider]  Insulin Glargine (LANTUS SOLOSTAR) 100 UNIT/ML Solostar Pen Inject 30 Units into the skin every 12 (twelve) hours.   Yes [provider]  latanoprost (XALATAN) 0.005 % ophthalmic solution Place 1 drop into both eyes at bedtime. 12/21/20  Yes [provider]  loratadine (CLARITIN) 10 MG tablet Take 10 mg by mouth daily.   Yes [provider]  losartan (COZAAR) 25 MG tablet Take 25 mg by mouth daily. 12/21/20  Yes [provider]  Multiple Vitamin (MULTIVITAMIN WITH MINERALS) TABS tablet Take 1 tablet by mouth daily.   Yes [provider]  NOVOLOG FLEXPEN 100 UNIT/ML FlexPen Inject 6-8 Units into the skin 3 (three) times daily. 01/02/21  Yes [provider]  omeprazole (PRILOSEC) 20 MG capsule Take 20 mg by mouth 2 (two) times daily. 12/04/20  Yes [provider]  spironolactone (ALDACTONE) 25 MG tablet Take 12.5 mg by mouth daily. 02/10/21  Yes [provider]  torsemide (DEMADEX) 20  MG tablet Take 40 mg by mouth daily. 01/04/21  Yes [provider]  traZODone (DESYREL) 50 MG tablet Take 50 mg by mouth at bedtime as needed for sleep. 12/04/20  Yes [provider]  finasteride (PROSCAR) 5 MG tablet Take 5 mg by mouth daily. Patient not taking: No sig reported 12/04/20   [provider]    Physical Exam: BP (!) 144/78   Pulse 73   Temp 97.8 F (36.6 C) (Axillary)   Resp 16   SpO2 98%   . General: 60 y.o. year-old male well developed well nourished in no acute distress.  Alert and oriented x3. . Cardiovascular: Regular rate and rhythm with no rubs or gallops.  No thyromegaly or JVD noted.  No lower extremity edema. 2/4 pulses in all 4 extremities. Marland Kitchen Respiratory: Clear to auscultation with no wheezes or rales. Good inspiratory effort. . Abdomen: Soft nontender nondistended with normal bowel sounds x4 quadrants. . Muskuloskeletal: No cyanosis, clubbing or edema noted bilaterally.  4 out of 5 strength in right upper extremity. . Neuro: CN II-XII intact, strength, sensation, reflexes . Skin: No ulcerative lesions noted or rashes . Psychiatry: Judgement and insight appear normal. Mood is appropriate for condition and setting          Labs on Admission:  Basic Metabolic Panel: Recent Labs  Lab 02/15/21 1525  NA 130*  K 5.2*  CL 95*  CO2 24  GLUCOSE 474*  BUN 70*  CREATININE 4.36*  CALCIUM 9.1   Liver Function Tests: Recent Labs  Lab 02/15/21 1525  AST 31  ALT 18  ALKPHOS 173*  BILITOT 0.9  PROT 8.2*  ALBUMIN 4.2   No results for input(s): LIPASE, AMYLASE in the last 168 hours. No results for input(s): AMMONIA in the last 168 hours. CBC: Recent Labs  Lab 02/15/21 1525  WBC 8.3  NEUTROABS 6.0  HGB 12.3*  HCT 37.8*  MCV 79.9*  PLT 237   Cardiac Enzymes: No results for input(s): CKTOTAL, CKMB, CKMBINDEX, TROPONINI in the last 168 hours.  BNP (last 3 results) No results for input(s): BNP in the last 8760 hours.  ProBNP  (last 3 results) No results for input(s): PROBNP in the last 8760 hours.  CBG: Recent Labs  Lab 02/15/21 1521 02/15/21 1838  GLUCAP 443* 105*    Radiological Exams on Admission: MR BRAIN WO CONTRAST  Result Date: 02/15/2021 CLINICAL DATA:  Neuro deficit, acute, stroke suspected. Additional history provided: Right-sided weakness. EXAM: MRI HEAD WITHOUT CONTRAST TECHNIQUE: Multiplanar, multiecho pulse sequences of the brain and surrounding structures were obtained without intravenous contrast. COMPARISON:  Noncontrast head CT 02/15/2021. FINDINGS: Brain: Mild cerebral and cerebellar atrophy. 6 mm focus of restricted diffusion within the left pons compatible with acute infarct (series 5, images 17 and 18). Background severe chronic small vessel ischemic disease within the pons and pontomesencephalic junction. This includes multiple chronic lacunar infarcts within the pons (some of which have associated chronic blood products). Mild-to-moderate for age chronic small vessel ischemic  disease within the cerebral white matter. Small chronic lacunar infarct within the dorsal left thalamus. Small chronic hemorrhage within the callosal genu on the left. No evidence of intracranial mass. No extra-axial fluid collection. No midline shift. Vascular: Expected proximal arterial flow voids. Skull and upper cervical spine: Expected proximal arterial flow voids. Sinuses/Orbits: Visualized orbits show no acute finding. Mild mucosal thickening within the left frontal, bilateral ethmoid and bilateral sphenoid sinuses. IMPRESSION: 6 mm acute infarct within the left pons. Background severe chronic small vessel ischemic disease within the pons and pontomesencephalic junction. Mild-to-moderate cerebral white matter chronic small vessel ischemic disease. Chronic left thalamic lacunar infarct. Small chronic hemorrhage within the callosal genu on the left. Mild generalized parenchymal atrophy. Mild paranasal sinus disease as  described. Electronically Signed   By: Kellie Simmering DO   On: 02/15/2021 17:07   CT HEAD CODE STROKE WO CONTRAST`  Result Date: 02/15/2021 CLINICAL DATA:  Code stroke.  Right-sided weakness. EXAM: CT HEAD WITHOUT CONTRAST TECHNIQUE: Contiguous axial images were obtained from the base of the skull through the vertex without intravenous contrast. COMPARISON:  None. FINDINGS: Brain: Mild atrophy. Mild white matter hypodensity bilaterally. Mild hypodensity in the pons likely chronic. Negative for acute infarct, hemorrhage, mass. Vascular: Negative for hyperdense vessel Skull: Negative Sinuses/Orbits: Negative Other: None ASPECTS ([place] Stroke Program Early CT Score) - Ganglionic level infarction (caudate, lentiform nuclei, internal capsule, insula, M1-M3 cortex): 7 - Supraganglionic infarction (M4-M6 cortex): 3 Total score (0-10 with 10 being normal): 10 IMPRESSION: 1. No acute abnormality 2. ASPECTS is 10 3. Code stroke imaging results were communicated on 02/15/2021 at 3:41 pm to provider Bhagat via text page Electronically Signed   By: Franchot Gallo M.D.   On: 02/15/2021 15:41    EKG: I independently viewed the EKG done and my findings are as followed: Sinus rhythm rate of 73.  Nonspecific ST-T changes.  QTc 442.  Assessment/Plan Present on Admission: . 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 Prior history of CVA History of uncontrolled type 2 diabetes, untreated OSA, uncontrolled hypertension, PVD, CKD. Obtain fasting lipid panel. Obtain hemoglobin A1c PT/OT/speech evaluation 2D echo with bubble study pending. MRA head and bilateral carotid Doppler ultrasound pending. Seen by neurology/stroke team recommended to continue home aspirin 81 mg daily, Plavix load 300 mg followed by 75 mg daily for 21-90-day course. Continue high intensity dose of statin, Lipitor 80 mg  daily. 30-day ambulatory event monitor. Normotension to be obtained gradually over several days. Continue frequent neurochecks.  Type 2 diabetes with hyperglycemia Obtain hemoglobin A1c Presented with serum glucose greater than 400. Diabetes coordinator for patient's education.  Essential hypertension Ongoing permissive hypertension due to acute CVA BP medication currently on hold, restart gradually Last BP 144/78 Continue to closely monitor vital signs.  Untreated OSA Self-reported he has been diagnosed with OSA and is in the process of receiving a CPAP He is currently awaiting for his appointment in order to receive his CPAP. Advised on the importance of following up, patient was receptive.  AKI on CKD 3B Baseline creatinine appears to be 2.1 with GFR of 37 Presented with creatinine of 4.4 GFR 15 Patient presents he follows up with nephrology and is currently on renal transplant list and is in the process of starting hemodialysis. Avoid nephrotoxic agents Home losartan and torsemide are currently on hold. Monitor urine output Repeat renal panel in the morning.  Hyperkalemia likely secondary to acute renal failure Presented with serum potassium 5.2 Received 1 dose of Lokelma 10 mg x 1 Repeat renal panel in the morning. Continue to hold the losartan and spironolactone.  Chronic anxiety/depression Resume home Lexapro.  GERD Asymptomatic Hold off home Protonix for now due to AKI.     DVT prophylaxis: SQ Heparin 3 times daily.  Code Status: Full code.  Family Communication: None at bedside.  Disposition Plan: MedSurg with remote telemetry.  Consults called: Neurology/stroke team.  Admission status: Inpatient status.  Patient requires at least 2 midnights for further evaluation and treatment of present condition.   Status is: Inpatient    Dispo:  Patient From: Home  Planned Disposition: Home with Health Care Svc  Anticipated date of discharge 02/17/2021 or  when neurology/stroke team sign off.  Medically stable for discharge: No, ongoing management of acute CVA.         Kayleen Memos MD [facility] Pager [phone]  If 7PM-7AM, please contact night-coverage [email or web address] Password [facility]  02/15/2021, 7:56 PM

## 2021-02-15 NOTE — ED Notes (Signed)
Patient is resting comfortably. 

## 2021-02-15 NOTE — Consult Note (Signed)
Neurology Consultation Reason for Consult: Code stroke for right-sided weakness Requesting Physician: Duffy Bruce  CC: Right-sided weakness and dietary indiscretion  History is obtained from: Patient and chart review  HPI: Bobby Sampson is a 60 y.o. male with a past medical history significant for left pontine stroke with right-sided weakness and no residual deficits (2016), poorly controlled diabetes (A1c 12.2 in 2016), hypertension, hyperlipidemia, neuropathy, ESRD preparing for transition to dialysis with ascites.  He reports multiple recent stressors recently including his mother having dementia and the same health problems as him, son struggling with alcoholism who has been staying with him and arguments with his wife secondary to his son's presence, among others.  Yesterday he reports he was in his normal state of health, had gone to his cardiology appointment, but then afterwards had had [employer].  He additionally had orange juice this morning when he normally only drinks water.  In the setting on EMS arrival his sugar was in the 600s.  He had also not taken his normal long-acting insulin this morning but had taken his normal short acting insulin just before EMS arrived (10 units).  His symptoms that started at 7 PM last night but were worse this morning.  LKW: 7 PM on 3/17 tPA given?: No, out of the window IA performed?: No, exam not concerning for LVO  Premorbid modified rankin scale:      0 - No symptoms.  ROS: All other review of systems was negative except as noted in the HPI.  Past Medical History:  Diagnosis Date  . Diabetes mellitus without complication ([place])   . Hypertension   . Neuropathy   . Renal disorder   . Stroke [facility])    2016   Past Surgical History:  Procedure Laterality Date  . TONSILLECTOMY      No current facility-administered medications for this encounter.  Current Outpatient Medications:  .  amLODipine (NORVASC) 10 MG tablet, Take 10 mg by mouth  daily., Disp: , Rfl:  .  aspirin EC 81 MG tablet, Take 81 mg by mouth daily., Disp: , Rfl:  .  atorvastatin (LIPITOR) 80 MG tablet, Take 80 mg by mouth at bedtime., Disp: , Rfl:  .  Calcium Acetate 667 MG TABS, Take 667 mg by mouth 3 (three) times daily with meals., Disp: , Rfl:  .  cholecalciferol (VITAMIN D) 25 MCG (1000 UT) tablet, Take 2,000 Units by mouth daily., Disp: , Rfl:  .  empagliflozin (JARDIANCE) 10 MG TABS tablet, Take 10 mg by mouth daily., Disp: , Rfl:  .  furosemide (LASIX) 40 MG tablet, Take 40 mg by mouth daily., Disp: , Rfl:  .  Insulin Glargine (LANTUS SOLOSTAR) 100 UNIT/ML Solostar Pen, Inject 30 Units into the skin every 12 (twelve) hours., Disp: , Rfl:  .  Melatonin 5 MG TABS, Take 5 mg by mouth at bedtime as needed (sleep)., Disp: , Rfl:  .  metoprolol tartrate (LOPRESSOR) 50 MG tablet, Take 50 mg by mouth 2 (two) times daily., Disp: , Rfl:  .  Multiple Vitamin (MULTIVITAMIN WITH MINERALS) TABS tablet, Take 1 tablet by mouth daily., Disp: , Rfl:  .  omeprazole (PRILOSEC OTC) 20 MG tablet, Take 20 mg by mouth daily., Disp: , Rfl:    History reviewed. No pertinent family history. on file, however, patient reports Patient note mother with c/f dementia, hypertension, diabetes, high cholesterol; son with ethanol use disorder   Social History:  reports that he has never smoked. He has never used smokeless tobacco.  He reports previous alcohol use. He reports previous drug use.   Exam: Current vital signs: BP (!) 163/77 (BP Location: Left Arm)   Pulse 74   Temp 97.8 F (36.6 C) (Axillary)   Resp 17   SpO2 96%  Vital signs in last 24 hours: Temp:  [97.8 F (36.6 C)] 97.8 F (36.6 C) (03/18 1544) Pulse Rate:  [74] 74 (03/18 1544) Resp:  [17] 17 (03/18 1544) BP: (163)/(77) 163/77 (03/18 1544) SpO2:  [96 %] 96 % (03/18 1544)   Physical Exam  Constitutional: Appears well-developed and well-nourished.  Psych: Affect appropriate to situation, calm and pleasant,  slightly sheepish Eyes: No scleral injection HENT: No oropharyngeal obstruction.  MSK: no joint deformities.  Cardiovascular: Normal rate and regular rhythm.  Respiratory: Effort normal, non-labored breathing GI: Soft.  Distended. There is no tenderness.  Skin: Warm dry and intact visible skin, other than bandage at right antecubital site (IV attempt by EMS)  Neuro: Mental Status: Patient is awake, alert, oriented to person, place, month, year, and situation Patient is able to give a clear and coherent history. No signs of aphasia or neglect Cranial Nerves: II: Visual Fields are full. Pupils are equal, round, and reactive to light.   III,IV, VI: EOMI without ptosis or diploplia, mildly saccadic pursuits   V: Facial sensation is symmetric to light touch VII: Facial movement is symmetric.  VIII: hearing is intact to voice  X: Uvula elevates symmetrically XI: Shoulder shrug is symmetric. XII: tongue is midline without atrophy or fasciculations.  Motor: Tone is mildly increased on the right. Bulk is normal.  Sensory: Sensation is symmetric to light touch in the arms and legs without extinction to double simultaneous stimulation Deep Tendon Reflexes: 2+ in the biceps and patellae on the left, 3+ on the right.  Cerebellar: FNF and HKS are mildly dysmetric bilaterally but symmetrically Gait:  Casual gait with slight unsteadiness of the right leg but able to walk from stretcher to bed  NIHSS total 2 Score breakdown: 1 for right leg drift, 1 for mild dysarthria  I have reviewed labs in epic and the results pertinent to this consultation are: Cr 4.36, GFR 15, Mild micocytic anemia (12.3 Hgb), Glucose 474, AG 11, BUN 70, Alk phos 173  I have reviewed the images obtained:  HCT without acute intracranial process, hypodensity in the left pons where prior stroke described in chart review MRI brain with acute left pontine stroke   Impression:   Recommendations: #Right-sided weakness,  recrudescence versus new left lacunar stroke - MRI brain  - If positive, stroke workup  - Glucose management per ED/primary team  Addendum:  # Left pontine recurrent stroke - Stroke labs TSH, RPR, HgbA1c, fasting lipid panel - MRI brain with left pontine stroke - MRA of the brain without contrast (ordered)  - Carotid dopplers (ordered)  - Frequent neuro checks - Echocardiogram (ordered) - continue home ASA 81 mg daily  - Plavix 300 mg load with 75 mg daily for 21 - 90 day course  - Risk factor modification - Telemetry monitoring; 30 day event monitor on discharge if no arrythmias captured  - Blood pressure goal   - Normotension to be obtained gradually over several days - PT consult, OT consult, Speech consult,  - Neurology to follow   Lesleigh Noe MD-PhD [facility] [phone] [facility] coverage for [facility] is from 8 AM to 4 AM in-house and 4 PM to 8 PM by telephone/video. 8 PM to 8 AM emergent questions  or overnight urgent questions should be addressed to Teleneurology On-call or [facility] neurohospitalist; contact information can be found on AMION

## 2021-02-15 NOTE — ED Notes (Signed)
Pt at MRI

## 2021-02-15 NOTE — ED Provider Notes (Signed)
[facility] Emergency Department Provider Note  ____________________________________________   Event Date/Time   First MD Initiated Contact with Patient 02/15/21 1525     (approximate)  I have reviewed the triage vital signs and the nursing notes.   HISTORY  Chief Complaint No chief complaint on file.    HPI Bobby Sampson is a 60 y.o. male  With h/o HTN, DM, CKD, here with stroke like symptoms.  Patient states that at aroudn 7 PM on 3/17, he developed acute onset of R arm heaviness and weakness. He has a h/o pontine CVA in the past. He reports he has been under multiple stressors recently and "let himself go" a little yesterday with significant dietary indiscretion. He states he began to notice his R arm feeling heavy and clumsy, as well as his leg, last evening and persisted throughout the day today. Called EMS and was noted to have BG>500. Reports he has since had gradual improvement but persistent weakness. He feels some facial numbness as well.  No vision changes. No headache.       Past Medical History:  Diagnosis Date  . Diabetes mellitus without complication ([place])   . Hypertension   . Neuropathy   . Renal disorder   . Stroke [facility])    2016    Patient Active Problem List   Diagnosis Date Noted  . Hypertension complicating diabetes ([place])   . Type 2 diabetes mellitus with foot ulcer ([place])   . AKI (acute kidney injury) ([place])   . COVID-19 11/07/2019    Past Surgical History:  Procedure Laterality Date  . TONSILLECTOMY      Prior to Admission medications   Medication Sig Start Date End Date Taking? Authorizing Provider  amLODipine (NORVASC) 10 MG tablet Take 10 mg by mouth daily.    [provider]  aspirin EC 81 MG tablet Take 81 mg by mouth daily.    [provider]  atorvastatin (LIPITOR) 80 MG tablet Take 80 mg by mouth at bedtime.    [provider]  Calcium Acetate 667 MG TABS Take 667 mg by mouth 3 (three)  times daily with meals.    [provider]  cholecalciferol (VITAMIN D) 25 MCG (1000 UT) tablet Take 2,000 Units by mouth daily.    [provider]  empagliflozin (JARDIANCE) 10 MG TABS tablet Take 10 mg by mouth daily.    [provider]  furosemide (LASIX) 40 MG tablet Take 40 mg by mouth daily.    [provider]  Insulin Glargine (LANTUS SOLOSTAR) 100 UNIT/ML Solostar Pen Inject 30 Units into the skin every 12 (twelve) hours.    [provider]  Melatonin 5 MG TABS Take 5 mg by mouth at bedtime as needed (sleep).    [provider]  metoprolol tartrate (LOPRESSOR) 50 MG tablet Take 50 mg by mouth 2 (two) times daily.    [provider]  Multiple Vitamin (MULTIVITAMIN WITH MINERALS) TABS tablet Take 1 tablet by mouth daily.    [provider]  omeprazole (PRILOSEC OTC) 20 MG tablet Take 20 mg by mouth daily.    [provider]    Allergies Trulicity [dulaglutide]  History reviewed. No pertinent family history.  Social History Social History   Tobacco Use  . Smoking status: Never Smoker  . Smokeless tobacco: Never Used  Substance Use Topics  . Alcohol use: Not Currently  . Drug use: Not Currently    Review of Systems  Review of  Systems  Constitutional: Negative for chills and fever.  HENT: Negative for sore throat.   Respiratory: Negative for shortness of breath.   Cardiovascular: Negative for chest pain.  Gastrointestinal: Negative for abdominal pain.  Genitourinary: Negative for flank pain.  Musculoskeletal: Negative for neck pain.  Skin: Negative for rash and wound.  Allergic/Immunologic: Negative for immunocompromised state.  Neurological: Positive for weakness and numbness.  Hematological: Does not bruise/bleed easily.  All other systems reviewed and are negative.    ____________________________________________  PHYSICAL EXAM:      VITAL SIGNS: ED Triage Vitals  Enc Vitals Group      BP      Pulse      Resp      Temp      Temp src      SpO2      Weight      Height      Head Circumference      Peak Flow      Pain Score      Pain Loc      Pain Edu?      Excl. in [place]?      Physical Exam Vitals and nursing note reviewed.  Constitutional:      General: He is not in acute distress.    Appearance: He is well-developed.  HENT:     Head: Normocephalic and atraumatic.  Eyes:     Conjunctiva/sclera: Conjunctivae normal.  Cardiovascular:     Rate and Rhythm: Normal rate and regular rhythm.     Heart sounds: Normal heart sounds. No murmur heard. No friction rub.  Pulmonary:     Effort: Pulmonary effort is normal. No respiratory distress.     Breath sounds: Normal breath sounds. No wheezing or rales.  Abdominal:     General: There is no distension.     Palpations: Abdomen is soft.     Tenderness: There is no abdominal tenderness.  Musculoskeletal:     Cervical back: Neck supple.  Skin:    General: Skin is warm.     Capillary Refill: Capillary refill takes less than 2 seconds.  Neurological:     Mental Status: He is alert and oriented to person, place, and time.     Motor: No abnormal muscle tone.       ____________________________________________   LABS (all labs ordered are listed, but only abnormal results are displayed)  Labs Reviewed  CBC - Abnormal; Notable for the following components:      Result Value   Hemoglobin 12.3 (*)    HCT 37.8 (*)    MCV 79.9 (*)    All other components within normal limits  COMPREHENSIVE METABOLIC PANEL - Abnormal; Notable for the following components:   Sodium 130 (*)    Potassium 5.2 (*)    Chloride 95 (*)    Glucose, Bld 474 (*)    BUN 70 (*)    Creatinine, Ser 4.36 (*)    Total Protein 8.2 (*)    Alkaline Phosphatase 173 (*)    GFR, Estimated 15 (*)    All other components within normal limits  URINALYSIS, ROUTINE W REFLEX MICROSCOPIC - Abnormal; Notable for the following components:   Color, Urine  STRAW (*)    APPearance CLEAR (*)    Glucose, UA >=500 (*)    Protein, ur 100 (*)    All other components within normal limits  CBG MONITORING, ED - Abnormal; Notable for the following components:   Glucose-Capillary 443 (*)  All other components within normal limits  RESP PANEL BY RT-PCR (FLU A&B, COVID) ARPGX2  PROTIME-INR  APTT  DIFFERENTIAL  ETHANOL  URINE DRUG SCREEN, QUALITATIVE ([facility] ONLY)  HEMOGLOBIN A1C  LIPID PANEL  TSH  RPR  HIV ANTIBODY (ROUTINE TESTING W REFLEX)  RAPID HIV SCREEN (HIV 1/2 AB+AG)    ____________________________________________  EKG: Normal sinus rhythm, ventricular rate 74.  QRS 95, QTc 442.  T wave inversions noted in inferior leads, likely repull abnormality.  No acute ST elevations. ________________________________________  RADIOLOGY All imaging, including plain films, CT scans, and ultrasounds, independently reviewed by me, and interpretations confirmed via formal radiology reads.  ED MD interpretation:   CT head: No acute abnormality MRI: Small acute pontine infarct  Official radiology report(s): MR BRAIN WO CONTRAST  Result Date: 02/15/2021 CLINICAL DATA:  Neuro deficit, acute, stroke suspected. Additional history provided: Right-sided weakness. EXAM: MRI HEAD WITHOUT CONTRAST TECHNIQUE: Multiplanar, multiecho pulse sequences of the brain and surrounding structures were obtained without intravenous contrast. COMPARISON:  Noncontrast head CT 02/15/2021. FINDINGS: Brain: Mild cerebral and cerebellar atrophy. 6 mm focus of restricted diffusion within the left pons compatible with acute infarct (series 5, images 17 and 18). Background severe chronic small vessel ischemic disease within the pons and pontomesencephalic junction. This includes multiple chronic lacunar infarcts within the pons (some of which have associated chronic blood products). Mild-to-moderate for age chronic small vessel ischemic disease within the cerebral white matter. Small  chronic lacunar infarct within the dorsal left thalamus. Small chronic hemorrhage within the callosal genu on the left. No evidence of intracranial mass. No extra-axial fluid collection. No midline shift. Vascular: Expected proximal arterial flow voids. Skull and upper cervical spine: Expected proximal arterial flow voids. Sinuses/Orbits: Visualized orbits show no acute finding. Mild mucosal thickening within the left frontal, bilateral ethmoid and bilateral sphenoid sinuses. IMPRESSION: 6 mm acute infarct within the left pons. Background severe chronic small vessel ischemic disease within the pons and pontomesencephalic junction. Mild-to-moderate cerebral white matter chronic small vessel ischemic disease. Chronic left thalamic lacunar infarct. Small chronic hemorrhage within the callosal genu on the left. Mild generalized parenchymal atrophy. Mild paranasal sinus disease as described. Electronically Signed   By: Kellie Simmering DO   On: 02/15/2021 17:07   CT HEAD CODE STROKE WO CONTRAST`  Result Date: 02/15/2021 CLINICAL DATA:  Code stroke.  Right-sided weakness. EXAM: CT HEAD WITHOUT CONTRAST TECHNIQUE: Contiguous axial images were obtained from the base of the skull through the vertex without intravenous contrast. COMPARISON:  None. FINDINGS: Brain: Mild atrophy. Mild white matter hypodensity bilaterally. Mild hypodensity in the pons likely chronic. Negative for acute infarct, hemorrhage, mass. Vascular: Negative for hyperdense vessel Skull: Negative Sinuses/Orbits: Negative Other: None ASPECTS ([place] Stroke Program Early CT Score) - Ganglionic level infarction (caudate, lentiform nuclei, internal capsule, insula, M1-M3 cortex): 7 - Supraganglionic infarction (M4-M6 cortex): 3 Total score (0-10 with 10 being normal): 10 IMPRESSION: 1. No acute abnormality 2. ASPECTS is 10 3. Code stroke imaging results were communicated on 02/15/2021 at 3:41 pm to provider Bhagat via text page Electronically Signed   By:  Franchot Gallo M.D.   On: 02/15/2021 15:41    ____________________________________________  PROCEDURES   Procedure(s) performed (including Critical Care):  .1-3 Lead EKG Interpretation Performed by: Duffy Bruce, MD Authorized by: Duffy Bruce, MD     Interpretation: normal     ECG rate:  70-90   ECG rate assessment: normal     Rhythm: sinus rhythm  Ectopy: none     Conduction: normal   Comments:     Indication: Stroke    ____________________________________________  INITIAL IMPRESSION / MDM / ASSESSMENT AND PLAN / ED COURSE  As part of my medical decision making, I reviewed the following data within the [place] notes reviewed and incorporated, Old chart reviewed, Notes from prior ED visits, and [place] Controlled Substance Database       *Choua Reisman was evaluated in Emergency Department on 02/15/2021 for the symptoms described in the history of present illness. He was evaluated in the context of the global COVID-19 pandemic, which necessitated consideration that the patient might be at risk for infection with the SARS-CoV-2 virus that causes COVID-19. Institutional protocols and algorithms that pertain to the evaluation of patients at risk for COVID-19 are in a state of rapid change based on information released by regulatory bodies including the CDC and federal and state organizations. These policies and algorithms were followed during the patient's care in the ED.  Some ED evaluations and interventions may be delayed as a result of limited staffing during the pandemic.*     Medical Decision Making: 60 year old male here with right arm and leg weakness.  He is Lucianne Lei negative with no signs of LVO.  CT head negative.  Neurology present at bedside for code stroke activation in the field.  Stat MRI obtained shows acute 6 mm pontine infarct.  Patient started on Plavix per neurology.  They do not recommend full dose aspirin.  Blood pressure is at goal.   Lab work otherwise shows significant CKD, slightly unclear what his baseline is.  Will admit to medicine for stroke work-up and monitoring.  Patient and wife updated.  ____________________________________________  FINAL CLINICAL IMPRESSION(S) / ED DIAGNOSES  Final diagnoses:  Lacunar infarct, acute ([place])     MEDICATIONS GIVEN DURING THIS VISIT:  Medications  clopidogrel (PLAVIX) tablet 300 mg (has no administration in time range)    Followed by  clopidogrel (PLAVIX) tablet 75 mg (has no administration in time range)  sodium chloride 0.9 % bolus 500 mL (500 mLs Intravenous Bolus 02/15/21 1608)  insulin aspart (novoLOG) injection 5 Units (5 Units Intravenous Given 02/15/21 1610)     ED Discharge Orders    None       Note:  This document was prepared using Dragon voice recognition software and may include unintentional dictation errors.   Duffy Bruce, MD 02/15/21 1723

## 2021-02-15 NOTE — Progress Notes (Signed)
   02/15/21 1512  Clinical Encounter Type  Visited With Patient  Visit Type Initial  Referral From Nurse  Consult/Referral To Chaplain  Spiritual Encounters  Spiritual Needs Prayer  Chaplain Autumn Patty responded to a Code Stroke, Pt Bobby Sampson ED-5. Pt was brought in via ambulatory and was sitting up and alert. Pt was taken straight to CT. No family members were present. I provided a ministry of presence and prayer.

## 2021-02-16 ENCOUNTER — Inpatient Hospital Stay (HOSPITAL_BASED_OUTPATIENT_CLINIC_OR_DEPARTMENT_OTHER)
Admit: 2021-02-16 | Discharge: 2021-02-16 | Disposition: A | Discharge status: Home / Self Care | Payer: Medicaid Other | Attending: Internal Medicine | Admitting: Internal Medicine

## 2021-02-16 ENCOUNTER — Other Ambulatory Visit: Payer: Self-pay

## 2021-02-16 DIAGNOSIS — E669 Obesity, unspecified: Secondary | ICD-10-CM

## 2021-02-16 DIAGNOSIS — I10 Essential (primary) hypertension: Secondary | ICD-10-CM | POA: Diagnosis not present

## 2021-02-16 DIAGNOSIS — I6389 Other cerebral infarction: Secondary | ICD-10-CM | POA: Diagnosis not present

## 2021-02-16 DIAGNOSIS — E1165 Type 2 diabetes mellitus with hyperglycemia: Secondary | ICD-10-CM

## 2021-02-16 DIAGNOSIS — Z794 Long term (current) use of insulin: Secondary | ICD-10-CM

## 2021-02-16 DIAGNOSIS — E66811 Obesity, class 1: Secondary | ICD-10-CM

## 2021-02-16 DIAGNOSIS — U071 COVID-19: Secondary | ICD-10-CM

## 2021-02-16 DIAGNOSIS — I6381 Other cerebral infarction due to occlusion or stenosis of small artery: Secondary | ICD-10-CM | POA: Diagnosis not present

## 2021-02-16 DIAGNOSIS — G4730 Sleep apnea, unspecified: Secondary | ICD-10-CM | POA: Diagnosis not present

## 2021-02-16 DIAGNOSIS — N184 Chronic kidney disease, stage 4 (severe): Secondary | ICD-10-CM

## 2021-02-16 DIAGNOSIS — I639 Cerebral infarction, unspecified: Secondary | ICD-10-CM | POA: Diagnosis present

## 2021-02-16 LAB — RAPID HIV SCREEN (HIV 1/2 AB+AG)
HIV 1/2 Antibodies: NONREACTIVE
HIV-1 P24 Antigen - HIV24: NONREACTIVE

## 2021-02-16 LAB — BASIC METABOLIC PANEL
Anion gap: 9 (ref 5–15)
BUN: 68 mg/dL — ABNORMAL HIGH (ref 8–23)
CO2: 26 mmol/L (ref 22–32)
Calcium: 8.9 mg/dL (ref 8.9–10.3)
Chloride: 103 mmol/L (ref 98–111)
Creatinine, Ser: 4.06 mg/dL — ABNORMAL HIGH (ref 0.61–1.24)
GFR, Estimated: 16 mL/min — ABNORMAL LOW (ref >60)
Glucose, Bld: 201 mg/dL — ABNORMAL HIGH (ref 70–99)
Potassium: 4.1 mmol/L (ref 3.5–5.1)
Sodium: 138 mmol/L (ref 135–145)

## 2021-02-16 LAB — CBC
HCT: 33.8 % — ABNORMAL LOW (ref 39.0–52.0)
Hemoglobin: 11.4 g/dL — ABNORMAL LOW (ref 13.0–17.0)
MCH: 26.7 pg (ref 26.0–34.0)
MCHC: 33.7 g/dL (ref 30.0–36.0)
MCV: 79.2 fL — ABNORMAL LOW (ref 80.0–100.0)
Platelets: 212 10*3/uL (ref 150–400)
RBC: 4.27 MIL/uL (ref 4.22–5.81)
RDW: 14.6 % (ref 11.5–15.5)
WBC: 10.7 10*3/uL — ABNORMAL HIGH (ref 4.0–10.5)
nRBC: 0 % (ref 0.0–0.2)

## 2021-02-16 LAB — LIPID PANEL
Cholesterol: 109 mg/dL (ref 0–200)
HDL: 35 mg/dL — ABNORMAL LOW (ref >40)
LDL Cholesterol: 57 mg/dL (ref 0–99)
Total CHOL/HDL Ratio: 3.1 RATIO
Triglycerides: 84 mg/dL (ref <150)
VLDL: 17 mg/dL (ref 0–40)

## 2021-02-16 LAB — ECHOCARDIOGRAM COMPLETE BUBBLE STUDY
AR max vel: 2.66 cm2
AV Peak grad: 4.2 mmHg
Ao pk vel: 1.02 m/s
Area-P 1/2: 7.29 cm2
S' Lateral: 3.05 cm

## 2021-02-16 LAB — HIV ANTIBODY (ROUTINE TESTING W REFLEX): HIV Screen 4th Generation wRfx: NONREACTIVE

## 2021-02-16 LAB — RPR: RPR Ser Ql: NONREACTIVE

## 2021-02-16 LAB — RESP PANEL BY RT-PCR (FLU A&B, COVID) ARPGX2
Influenza A by PCR: NEGATIVE
Influenza B by PCR: NEGATIVE
SARS Coronavirus 2 by RT PCR: NEGATIVE

## 2021-02-16 LAB — TSH: TSH: 1.027 u[IU]/mL (ref 0.350–4.500)

## 2021-02-16 LAB — GLUCOSE, CAPILLARY
Glucose-Capillary: 238 mg/dL — ABNORMAL HIGH (ref 70–99)
Glucose-Capillary: 364 mg/dL — ABNORMAL HIGH (ref 70–99)

## 2021-02-16 LAB — PHOSPHORUS: Phosphorus: 4.6 mg/dL (ref 2.5–4.6)

## 2021-02-16 LAB — MAGNESIUM: Magnesium: 1.8 mg/dL (ref 1.7–2.4)

## 2021-02-16 MED ORDER — INSULIN ASPART 100 UNIT/ML ~~LOC~~ SOLN
0.0000 [IU] | Freq: Three times a day (TID) | SUBCUTANEOUS | Status: DC
  Administered 2021-02-16: 13:00:00 5 [IU] via SUBCUTANEOUS
  Filled 2021-02-16: qty 1

## 2021-02-16 MED ORDER — PANTOPRAZOLE SODIUM 40 MG PO TBEC: 40.0000 mg | DELAYED_RELEASE_TABLET | Freq: Every day | ORAL | 0 refills | Status: DC

## 2021-02-16 MED ORDER — CARVEDILOL 6.25 MG PO TABS: 6.2500 mg | ORAL_TABLET | Freq: Two times a day (BID) | ORAL | Status: DC

## 2021-02-16 MED ORDER — LOSARTAN POTASSIUM 25 MG PO TABS: 25.0000 mg | ORAL_TABLET | Freq: Every day | ORAL | Status: DC

## 2021-02-16 MED ORDER — PERFLUTREN LIPID MICROSPHERE
1.0000 mL | INTRAVENOUS | Status: AC | PRN
  Administered 2021-02-16: 3 mL via INTRAVENOUS
  Filled 2021-02-16: qty 10

## 2021-02-16 MED ORDER — INSULIN ASPART 100 UNIT/ML ~~LOC~~ SOLN: 0.0000 [IU] | Freq: Every day | SUBCUTANEOUS | Status: DC

## 2021-02-16 MED ORDER — INSULIN ASPART 100 UNIT/ML ~~LOC~~ SOLN
4.0000 [IU] | Freq: Three times a day (TID) | SUBCUTANEOUS | Status: DC
  Administered 2021-02-16: 4 [IU] via SUBCUTANEOUS
  Filled 2021-02-16: qty 1

## 2021-02-16 MED ORDER — CLOPIDOGREL BISULFATE 75 MG PO TABS: 75.0000 mg | ORAL_TABLET | Freq: Every day | ORAL | 0 refills | Status: AC

## 2021-02-16 MED ORDER — INSULIN ASPART 100 UNIT/ML ~~LOC~~ SOLN: 0.0000 [IU] | Freq: Three times a day (TID) | SUBCUTANEOUS | Status: DC

## 2021-02-16 MED ORDER — INSULIN GLARGINE 100 UNIT/ML ~~LOC~~ SOLN
20.0000 [IU] | Freq: Every day | SUBCUTANEOUS | Status: DC
  Administered 2021-02-16: 13:00:00 20 [IU] via SUBCUTANEOUS
  Filled 2021-02-16 (×2): qty 0.2

## 2021-02-16 MED ORDER — HEPARIN SODIUM (PORCINE) 5000 UNIT/ML IJ SOLN: 5000.0000 [IU] | Freq: Three times a day (TID) | INTRAMUSCULAR | Status: DC

## 2021-02-16 MED ORDER — LANTUS SOLOSTAR 100 UNIT/ML ~~LOC~~ SOPN: 20.0000 [IU] | PEN_INJECTOR | Freq: Two times a day (BID) | SUBCUTANEOUS | 11 refills | Status: DC

## 2021-02-16 MED ORDER — INSULIN ASPART 100 UNIT/ML ~~LOC~~ SOLN: 4.0000 [IU] | Freq: Three times a day (TID) | SUBCUTANEOUS | Status: DC

## 2021-02-16 MED ORDER — TORSEMIDE 20 MG PO TABS
40.0000 mg | ORAL_TABLET | Freq: Every day | ORAL | Status: DC
  Administered 2021-02-16: 40 mg via ORAL
  Filled 2021-02-16: qty 2

## 2021-02-16 NOTE — Evaluation (Signed)
Occupational Therapy Evaluation Patient Details Name: Bobby Sampson MRN: [id number] DOB: 1961/05/05 Today's Date: 02/16/2021    History of Present Illness Pt is a 60 y/o M admitted from home on 02/15/21 with c/c of R sided weakness. MRI revaled small acute L pontine infarct (pt outside of TPA window). PMH: essential HTN, DM2, CVA, ESRD on transplant list & transitioning to HD, HLD, neuropathy, stroke (2016)   Clinical Impression   Pt presents for OT evaluation this date in setting of acute hospitalization d/t stroke. Pt reports being MOD I with AE for opening containers at baseline d/t h/o stroke, but otherwise is fairly INDEP. Pt presents this date with more pronounced weakness, FMC deficits and decreased sensation to R hand/digits impacting his ability to efficiently perform self care ADLs. Pt with decreased digit flex/ext, grip strength grossly 3+/5 (versus 5/5 on L hand) and is unable to oppose all digits to thumb sequentially. Anticipate pt will require skilled OT in outpatient setting to improve [facility] skills and grip strength to regain his highest level of fxl independence with self care.     Follow Up Recommendations  Outpatient OT    Equipment Recommendations  None recommended by OT    Recommendations for Other Services       Precautions / Restrictions Precautions Precautions: Fall Restrictions Weight Bearing Restrictions: No      Mobility Bed Mobility Overal bed mobility: Modified Independent                  Transfers Overall transfer level: Modified independent                    Balance Overall balance assessment: Mild deficits observed, not formally tested                                         ADL either performed or assessed with clinical judgement   ADL                                         General ADL Comments: pt requires MOD A for all [facility] self care tasks including fasterning buttons/toggles and unscewing  lids/opening condiments. This was already difficult at baseline, but pt requires assistance with all items from spouse     Vision Patient Visual Report: No change from baseline       Perception     Praxis      Pertinent Vitals/Pain Pain Assessment: No/denies pain     Hand Dominance Right   Extremity/Trunk Assessment Upper Extremity Assessment Upper Extremity Assessment: RUE deficits/detail RUE Deficits / Details: pt with decreased [place] and grip/pinch strength at baseline d/t h/o stroke, but it is more pronounced at this time. Pt with difficulty manipulating any small item including buttons on his touch screen phone. Shld with decreased ROM at baseline 2/2 pain (1/2 range) RUE Coordination: decreased fine motor;decreased gross motor   Lower Extremity Assessment Lower Extremity Assessment: Defer to PT evaluation       Communication Communication Communication: No difficulties   Cognition Arousal/Alertness: Awake/alert Behavior During Therapy: WFL for tasks assessed/performed Overall Cognitive Status: Within Functional Limits for tasks assessed  General Comments       Exercises Other Exercises Other Exercises: [facility] exercises   Shoulder Instructions      Home Living Family/patient expects to be discharged to:: Private residence Living Arrangements: Spouse/significant other;Children (70 y/o daughter (62 y/o son temporarily staying with pt)) Available Help at Discharge: Available PRN/intermittently;Family (wife works as Quarry manager during the day) Type of Home: [employer] Access: [place]: One [place]: Environmental consultant - 4 wheels;Cane - quad;Cane - single point          Prior Functioning/Environment Level of Independence: Independent        Comments: independent without AD, 1 fall the night before admission bending over trying to stretch LE, goes to the [facility] & lifts  weights, driving, on disability 2/2 stroke in 2016        OT Problem List: Decreased strength;Decreased range of motion;Decreased coordination;Impaired sensation      OT Treatment/Interventions: Self-care/ADL training;Therapeutic activities    OT Goals(Current goals can be found in the care plan section) Acute Rehab OT Goals Patient Stated Goal: to go home OT Goal Formulation: All assessment and education complete, DC therapy  OT Frequency: Min 1X/week   Barriers to D/C:            Co-evaluation              AM-PAC OT "6 Clicks" Daily Activity     Outcome Measure Help from another person eating meals?: A Little Help from another person taking care of personal grooming?: A Little Help from another person toileting, which includes using toliet, bedpan, or urinal?: None Help from another person bathing (including washing, rinsing, drying)?: None Help from another person to put on and taking off regular upper body clothing?: A Little Help from another person to put on and taking off regular lower body clothing?: A Little 6 Click Score: 20   End of Session Nurse Communication: Mobility status  Activity Tolerance: Patient tolerated treatment well Patient left: Other (comment) (eated EOB with spouse present)  OT Visit Diagnosis: Muscle weakness (generalized) (M62.81);Other symptoms and signs involving the nervous system (R29.898)                Time: [id number] OT Time Calculation (min): 23 min Charges:  OT General Charges $OT Visit: 1 Visit OT Evaluation $OT Eval Low Complexity: 1 Low OT Treatments $Therapeutic Activity: 8-22 mins  Gerrianne Scale, MS, OTR/L ascom [phone] 02/16/21, 5:13 PM

## 2021-02-16 NOTE — Evaluation (Signed)
Clinical/Bedside Swallow Evaluation Patient Details  Name: Addicus Langille MRN: [id number] Date of Birth: 1960/12/17  Today's Date: 02/16/2021 Time: SLP Start Time (ACUTE ONLY): 0940 SLP Stop Time (ACUTE ONLY): 1025 SLP Time Calculation (min) (ACUTE ONLY): 45 min  Past Medical History:  Past Medical History:  Diagnosis Date  . Diabetes mellitus without complication ([place])   . Hypertension   . Neuropathy   . Renal disorder   . Stroke [facility])    2016   Past Surgical History:  Past Surgical History:  Procedure Laterality Date  . TONSILLECTOMY     HPI:  Pt is a 60 y.o. male with medical history significant for essential hypertension, type 2 diabetes, prior CVA, ESRD on renal transplant list and transitioning to hemodialysis, hyperlipidemia, who presented to [facility] ED due to right sided weakness.  Last known well was around 7 PM on 02/14/2021.  Noticed right-sided weakness but he thought it was secondary to his elevated blood sugar which was in the 600s.  He noted that he was dragging his right leg and realized that his symptoms were not going away and that he could be having a stroke.  He decided to present to the ED for further evaluation.  While in the ED, MRI brain showed small acute left pontine infarct.  Pt also c/o RUE weakness.   Assessment / Plan / Recommendation Clinical Impression  Pt appears to present w/ adequate oropharyngeal phase swallow function w/ No oropharyngeal phase dysphagia noted, No neuromuscular deficits noted. Pt consumed po trials w/ No overt, clinical s/s of aspiration during po trials. Pt appears at reduced risk for aspiration following general aspiration precautions. During po trials, pt consumed all consistencies w/ no overt coughing, decline in vocal quality, or change in respiratory presentation during/post trials. Oral phase appeared [facility] w/ timely bolus management, mastication, and control of bolus propulsion for A-P transfer for swallowing. Oral clearing achieved w/  all trial consistencies. OM Exam appeared [facility] w/ no unilateral weakness noted. Speech Clear. Pt fed self w/ setup support d/t RUE weakness noted by pt/SLP. Recommend a Regular consistency diet w/ Cut meats, moistened foods; Thin liquids. Recommend general aspiration precautions, Pills being tolerated given w/ thin liquids per pt/NSG. Education given on food consistencies and easy to eat options; general aspiration precautions. NSG to reconsult if any new needs arise. NSG agreed.  Addendum: No speech-language deficits appreciated during this BSE. Pt communicated all wants/needs appropriately; utilized phone for texting/call; conversed in conversation w/out deficits noted. SLP Visit Diagnosis: Dysphagia, unspecified (R13.10)    Aspiration Risk  No limitations    Diet Recommendation  Regular diet w/ thin liquids; general aspiration precautions  Medication Administration: Whole meds with liquid    Other  Recommendations Recommended Consults:  (n/a) Oral Care Recommendations: Oral care BID;Patient independent with oral care Other Recommendations:  (n/a)   Follow up Recommendations None      Frequency and Duration  (n/a)   (n/a)       Prognosis Prognosis for Safe Diet Advancement: Good Barriers to Reach Goals:  (n/a)      Swallow Study   General Date of Onset: 02/15/21 HPI: Pt is a 60 y.o. male with medical history significant for essential hypertension, type 2 diabetes, prior CVA, ESRD on renal transplant list and transitioning to hemodialysis, hyperlipidemia, who presented to [facility] ED due to right sided weakness.  Last known well was around 7 PM on 02/14/2021.  Noticed right-sided weakness but he thought it was secondary to his elevated blood  sugar which was in the 600s.  He noted that he was dragging his right leg and realized that his symptoms were not going away and that he could be having a stroke.  He decided to present to the ED for further evaluation.  While in the ED, MRI brain showed  small acute left pontine infarct.  Pt also c/o RUE weakness. Type of Study: Bedside Swallow Evaluation Previous Swallow Assessment: none Diet Prior to this Study: Regular;Thin liquids Temperature Spikes Noted: No (wbc 10.7) Respiratory Status: Room air History of Recent Intubation: No Behavior/Cognition: Alert;Cooperative;Pleasant mood Oral Cavity Assessment: Within Functional Limits Oral Care Completed by SLP: Recent completion by staff Oral Cavity - Dentition: Adequate natural dentition Vision: Functional for self-feeding Self-Feeding Abilities: Able to feed self Patient Positioning: Upright in chair Baseline Vocal Quality: Normal Volitional Cough: Strong Volitional Swallow: Able to elicit    Oral/Motor/Sensory Function Overall Oral Motor/Sensory Function: Within functional limits   Ice Chips Ice chips: Within functional limits   Thin Liquid Thin Liquid: Within functional limits Presentation: Cup;Self Fed;Straw (multiple sips)    Nectar Thick Nectar Thick Liquid: Not tested   Honey Thick Honey Thick Liquid: Not tested   Puree Puree: Within functional limits Presentation: Self Fed;Spoon (4 trials)   Solid     Solid: Within functional limits Presentation: Self Fed;Spoon (6+ trials)        Orinda Kenner, MS, CCC-SLP Speech Language Pathologist Rehab Services [phone] Keidrick Murty 02/16/2021,11:37 AM

## 2021-02-16 NOTE — Discharge Summary (Signed)
[place] [facility] [place] NAME: Bobby Sampson    MR#:  [id number]  DATE OF BIRTH:  1961/07/29  DATE OF ADMISSION:  02/15/2021 ADMITTING PHYSICIAN: Loletha Grayer, MD  DATE OF DISCHARGE: 02/16/2021  PRIMARY CARE PHYSICIAN: [facility]    ADMISSION DIAGNOSIS:  Lacunar infarct, acute ([place]) [I63.81] Acute CVA (cerebrovascular accident) ([place]) [I63.9] Stroke determined by clinical assessment ([place]) [I63.9] Stroke ([place]) [I63.9]  DISCHARGE DIAGNOSIS:  Active Problems:   Acute CVA (cerebrovascular accident) ([place])   Stroke ([place])   SECONDARY DIAGNOSIS:   Past Medical History:  Diagnosis Date  . Diabetes mellitus without complication ([place])   . Hypertension   . Neuropathy   . Renal disorder   . Stroke [facility])    2016    HOSPITAL COURSE:   1.  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  The patient did well with physical therapy and they recommended outpatient physical therapy.  Patient was seen by neurology and Plavix load 300 mg x 1 was given and 75 mg for 21 days was prescribed.  Continue aspirin and atorvastatin.  LDL 57.  Echo did not show any source of stroke but the saline contrast study inadequate.  Carotid ultrasound minimal plaque. 2.  Type 2 diabetes mellitus with hyperglycemia.  Patient takes Lantus 20 units twice a day and short acting insulin prior to meals.  With his creatinine were elevated need to keep an eye out for low sugars.  Hemoglobin A1c pending here but hemoglobin A1c last month was 9.0 in care everywhere. 4.  Chronic kidney disease stage IV.  Looking back at creatinines in care everywhere his creatinine has ranged between 3.7 and 4.4.  Creatinine upon discharge 4.06.  Follow-up nephrology as outpatient.  Recommend outpatient BMP 1 week 5.  Essential hypertension.  We allowed permissive hypertension initially.  Will restart Coreg here in the hospital and torsemide and losartan after that.  Patient has  a blood pressure cuff at home and he will take his blood pressure if blood pressure really starts rising can go back on Norvasc.  Discontinue spironolactone. 6.  Sleep apnea.  Family in the process of getting a CPAP. 7.  Hyperkalemia initially improved with 1 dose of Lokelma.  Discontinue spironolactone.  Can go back on losartan.  Check a BMP in 1 week with follow-up appointment. 8.  Obesity with BMI of 32.49.  DISCHARGE CONDITIONS:  Satisfactory  CONSULTS OBTAINED:  Neurology  DRUG ALLERGIES:   Allergies  Allergen Reactions  . Trulicity [Dulaglutide] Nausea And Vomiting  . Liraglutide Nausea And Vomiting    Other reaction(s): Nausea and vomiting, Fatigue    DISCHARGE MEDICATIONS:   Allergies as of 02/16/2021      Reactions   Trulicity [dulaglutide] Nausea And Vomiting   Liraglutide Nausea And Vomiting   Other reaction(s): Nausea and vomiting, Fatigue      Medication List    STOP taking these medications   amLODipine 10 MG tablet Commonly known as: NORVASC   finasteride 5 MG tablet Commonly known as: PROSCAR   omeprazole 20 MG capsule Commonly known as: PRILOSEC   spironolactone 25 MG tablet Commonly known as: ALDACTONE     TAKE these medications   aspirin EC 81 MG tablet Take 81 mg by mouth daily.   atorvastatin 80 MG tablet Commonly known as: LIPITOR Take 80 mg by mouth daily.   Calcium Acetate 667 MG Tabs Take 667 mg by mouth 3 (three) times daily  with meals.   carvedilol 6.25 MG tablet Commonly known as: COREG Take 6.25 mg by mouth 2 (two) times daily with a meal.   cholecalciferol 25 MCG (1000 UNIT) tablet Commonly known as: VITAMIN D Take 2,000 Units by mouth daily.   clopidogrel 75 MG tablet Commonly known as: PLAVIX Take 1 tablet (75 mg total) by mouth daily for 21 days. Start taking on: February 17, 2021   escitalopram 20 MG tablet Commonly known as: LEXAPRO Take 20 mg by mouth daily.   Lantus SoloStar 100 UNIT/ML Solostar Pen Generic  drug: insulin glargine Inject 20 Units into the skin every 12 (twelve) hours. What changed: how much to take   latanoprost 0.005 % ophthalmic solution Commonly known as: XALATAN Place 1 drop into both eyes at bedtime.   loratadine 10 MG tablet Commonly known as: CLARITIN Take 10 mg by mouth daily.   losartan 25 MG tablet Commonly known as: COZAAR Take 25 mg by mouth daily.   multivitamin with minerals Tabs tablet Take 1 tablet by mouth daily.   NovoLOG FlexPen 100 UNIT/ML FlexPen Generic drug: insulin aspart Inject 6-8 Units into the skin 3 (three) times daily.   pantoprazole 40 MG tablet Commonly known as: Protonix Take 1 tablet (40 mg total) by mouth daily.   torsemide 20 MG tablet Commonly known as: DEMADEX Take 40 mg by mouth daily.   traZODone 50 MG tablet Commonly known as: DESYREL Take 50 mg by mouth at bedtime as needed for sleep.        DISCHARGE INSTRUCTIONS:   Follow-up PMD 5 days Follow-up your nephrologist Follow-up cardiology 1 week Follow-up your neurologist 2 weeks  If you experience worsening of your admission symptoms, develop shortness of breath, life threatening emergency, suicidal or homicidal thoughts you must seek medical attention immediately by calling 911 or calling your MD immediately  if symptoms less severe.  You Must read complete instructions/literature along with all the possible adverse reactions/side effects for all the Medicines you take and that have been prescribed to you. Take any new Medicines after you have completely understood and accept all the possible adverse reactions/side effects.   Please note  You were cared for by a hospitalist during your hospital stay. If you have any questions about your discharge medications or the care you received while you were in the hospital after you are discharged, you can call the unit and asked to speak with the hospitalist on call if the hospitalist that took care of you is not  available. Once you are discharged, your primary care physician will handle any further medical issues. Please note that NO REFILLS for any discharge medications will be authorized once you are discharged, as it is imperative that you return to your primary care physician (or establish a relationship with a primary care physician if you do not have one) for your aftercare needs so that they can reassess your need for medications and monitor your lab values.    Today   CHIEF COMPLAINT:  No chief complaint on file.   HISTORY OF PRESENT ILLNESS:  Sanders Kleinhans  is a 60 y.o. male came in with stroke and right-sided weakness   VITAL SIGNS:  Blood pressure (!) 169/82, pulse 81, temperature 97.7 F (36.5 C), resp. rate 15, SpO2 99 %.  I/O:    Intake/Output Summary (Last 24 hours) at 02/16/2021 1644 Last data filed at 02/16/2021 0700 Gross per 24 hour  Intake --  Output 1420 ml  Net -1420 ml  PHYSICAL EXAMINATION:  GENERAL:  60 y.o.-year-old patient lying in the bed with no acute distress.  EYES: Pupils equal, round, reactive to light and accommodation. No scleral icterus. Extraocular muscles intact.  HEENT: Oropharynx clear.  LUNGS: Normal breath sounds bilaterally, no wheezing, rales,rhonchi or crepitation. No use of accessory muscles of respiration.  CARDIOVASCULAR: S1, S2 normal. No murmurs, rubs, or gallops.  ABDOMEN: Soft, non-tender, non-distended.  EXTREMITIES: Trace pedal edema.  NEUROLOGIC: Cranial nerves II through XII are intact. Muscle strength 5/5 in all extremities. Sensation intact. Gait not checked.  PSYCHIATRIC: The patient is alert and oriented x 3.  SKIN: No obvious rash, lesion, or ulcer.   DATA REVIEW:   CBC Recent Labs  Lab 02/16/21 0416  WBC 10.7*  HGB 11.4*  HCT 33.8*  PLT 212    Chemistries  Recent Labs  Lab 02/15/21 1525 02/16/21 0416  NA 130* 138  K 5.2* 4.1  CL 95* 103  CO2 24 26  GLUCOSE 474* 201*  BUN 70* 68*  CREATININE 4.36* 4.06*   CALCIUM 9.1 8.9  MG  --  1.8  AST 31  --   ALT 18  --   ALKPHOS 173*  --   BILITOT 0.9  --     Microbiology Results  Results for orders placed or performed during the hospital encounter of 02/15/21  Resp Panel by RT-PCR (Flu A&B, Covid) Nasopharyngeal Swab     Status: None   Collection Time: 02/15/21  4:00 PM   Specimen: Nasopharyngeal Swab; Nasopharyngeal(NP) swabs in vial transport medium  Result Value Ref Range Status   SARS Coronavirus 2 by RT PCR NEGATIVE NEGATIVE Final    Comment: (NOTE) SARS-CoV-2 target nucleic acids are NOT DETECTED.  The SARS-CoV-2 RNA is generally detectable in upper respiratory specimens during the acute phase of infection. The lowest concentration of SARS-CoV-2 viral copies this assay can detect is 138 copies/mL. A negative result does not preclude SARS-Cov-2 infection and should not be used as the sole basis for treatment or other patient management decisions. A negative result may occur with  improper specimen collection/handling, submission of specimen other than nasopharyngeal swab, presence of viral mutation(s) within the areas targeted by this assay, and inadequate number of viral copies(<138 copies/mL). A negative result must be combined with clinical observations, patient history, and epidemiological information. The expected result is Negative.  Fact Sheet for Patients:  EntrepreneurPulse.com.au  Fact Sheet for Healthcare Providers:  IncredibleEmployment.be  This test is no t yet approved or cleared by the [place] FDA and  has been authorized for detection and/or diagnosis of SARS-CoV-2 by FDA under an Emergency Use Authorization (EUA). This EUA will remain  in effect (meaning this test can be used) for the duration of the COVID-19 declaration under Section 564(b)(1) of the Act, 21 U.S.C.section 360bbb-3(b)(1), unless the authorization is terminated  or revoked sooner.       Influenza A by  PCR NEGATIVE NEGATIVE Final   Influenza B by PCR NEGATIVE NEGATIVE Final    Comment: (NOTE) The Xpert Xpress SARS-CoV-2/FLU/RSV plus assay is intended as an aid in the diagnosis of influenza from Nasopharyngeal swab specimens and should not be used as a sole basis for treatment. Nasal washings and aspirates are unacceptable for Xpert Xpress SARS-CoV-2/FLU/RSV testing.  Fact Sheet for Patients: EntrepreneurPulse.com.au  Fact Sheet for Healthcare Providers: IncredibleEmployment.be  This test is not yet approved or cleared by the [place] FDA and has been authorized for detection and/or diagnosis of SARS-CoV-2 by FDA  under an Emergency Use Authorization (EUA). This EUA will remain in effect (meaning this test can be used) for the duration of the COVID-19 declaration under Section 564(b)(1) of the Act, 21 U.S.C. section 360bbb-3(b)(1), unless the authorization is terminated or revoked.  Performed at [facility], [address] [place], [place] [address]     RADIOLOGY:  MR ANGIO HEAD WO CONTRAST  Result Date: 02/15/2021 CLINICAL DATA:  Follow-up examination for acute stroke. EXAM: MRA HEAD WITHOUT CONTRAST TECHNIQUE: Angiographic images of the Circle of Willis were obtained using MRA technique without intravenous contrast. COMPARISON:  Previous MRI from earlier the same day. FINDINGS: ANTERIOR CIRCULATION: Examination degraded by motion artifact. Visualized distal cervical segments of the internal carotid arteries are patent with antegrade flow. Petrous segments patent bilaterally. Extensive atheromatous irregularity throughout the carotid siphons. Associated short-segment severe stenosis at the cavernous left ICA (series 5, image 78). More moderate irregular stenosis seen at the cavernous right ICA (series 1042, image 4). ICA termini well perfused bilaterally. Left A1 segment patent. Right A1 hypoplastic and/or absent, accounting for the  diminutive right ICA is compared to the left. Normal anterior communicating artery complex. Anterior cerebral arteries patent to their distal aspects without high-grade stenosis. No M1 stenosis or occlusion. Normal MCA bifurcations. Distal MCA branches well perfused and symmetric. POSTERIOR CIRCULATION: Both V4 segments patent to the vertebrobasilar junction without stenosis. Left V4 slightly dominant. Left PICA patent. Right PICA not visualized. Basilar patent to its distal aspect without stenosis. Superior cerebellar arteries patent bilaterally. Both PCAs primarily supplied via the basilar. PCAs well perfused to their distal aspects without stenosis. No visible aneurysm. IMPRESSION: 1. Negative intracranial MRA for large vessel occlusion. 2. Extensive atheromatous irregularity throughout the carotid siphons with associated moderate to severe multifocal stenoses as above, left worse than right. 3. Hypoplastic and/or absent right A1 segment, with the ACAs supplied primarily via the left carotid artery system. 4. Wide patency of the posterior circulation. Electronically Signed   By: Jeannine Boga M.D.   On: 02/15/2021 21:00   MR BRAIN WO CONTRAST  Result Date: 02/15/2021 CLINICAL DATA:  Neuro deficit, acute, stroke suspected. Additional history provided: Right-sided weakness. EXAM: MRI HEAD WITHOUT CONTRAST TECHNIQUE: Multiplanar, multiecho pulse sequences of the brain and surrounding structures were obtained without intravenous contrast. COMPARISON:  Noncontrast head CT 02/15/2021. FINDINGS: Brain: Mild cerebral and cerebellar atrophy. 6 mm focus of restricted diffusion within the left pons compatible with acute infarct (series 5, images 17 and 18). Background severe chronic small vessel ischemic disease within the pons and pontomesencephalic junction. This includes multiple chronic lacunar infarcts within the pons (some of which have associated chronic blood products). Mild-to-moderate for age chronic  small vessel ischemic disease within the cerebral white matter. Small chronic lacunar infarct within the dorsal left thalamus. Small chronic hemorrhage within the callosal genu on the left. No evidence of intracranial mass. No extra-axial fluid collection. No midline shift. Vascular: Expected proximal arterial flow voids. Skull and upper cervical spine: Expected proximal arterial flow voids. Sinuses/Orbits: Visualized orbits show no acute finding. Mild mucosal thickening within the left frontal, bilateral ethmoid and bilateral sphenoid sinuses. IMPRESSION: 6 mm acute infarct within the left pons. Background severe chronic small vessel ischemic disease within the pons and pontomesencephalic junction. Mild-to-moderate cerebral white matter chronic small vessel ischemic disease. Chronic left thalamic lacunar infarct. Small chronic hemorrhage within the callosal genu on the left. Mild generalized parenchymal atrophy. Mild paranasal sinus disease as described. Electronically Signed   By: Marylyn Ishihara  Golden DO   On: 02/15/2021 17:07   US Carotid Bilateral  Result Date: 02/15/2021 CLINICAL DATA:  Initial evaluation for acute stroke symptoms. EXAM: BILATERAL CAROTID DUPLEX ULTRASOUND TECHNIQUE: Pearline Cables scale imaging, color Doppler and duplex ultrasound were performed of bilateral carotid and vertebral arteries in the neck. COMPARISON:  None available. FINDINGS: Criteria: Quantification of carotid stenosis is based on velocity parameters that correlate the residual internal carotid diameter with NASCET-based stenosis levels, using the diameter of the distal internal carotid lumen as the denominator for stenosis measurement. The following velocity measurements were obtained: RIGHT ICA: 75/26 cm/sec CCA: [id number] cm/sec SYSTOLIC ICA/CCA RATIO:  0.9 ECA: 127 cm/sec LEFT ICA: 95/22 cm/sec CCA: [id number] cm/sec SYSTOLIC ICA/CCA RATIO:  0.9 ECA: 114 cm/sec RIGHT CAROTID ARTERY: Relatively mild intimal thickening within the visualized right  CCA without significant stenosis. Intimal thickening with mild echogenic plaque seen about the right carotid bulb. No significant elevation in peak systolic velocity to suggest hemodynamically significant stenosis. Visualized right ICA widely patent distally. RIGHT VERTEBRAL ARTERY:  Antegrade. LEFT CAROTID ARTERY: Mild intimal thickening within the visualized left CCA without significant stenosis. Intimal thickening with heterogeneous echogenic plaque about the left carotid bulb, slightly more pronounced as compared to the contralateral right ICA. No significant elevation in peak systolic velocity to suggest hemodynamically significant stenosis however. Visualized left ICA patent distally without significant stenosis. LEFT VERTEBRAL ARTERY:  Antegrade. IMPRESSION: 1. Intimal thickening with heterogeneous plaque about the bilateral carotid bulbs, left greater than right, which results in less than 50% stenosis of the bilateral internal carotid arteries by Doppler criteria. No hemodynamically significant stenosis identified within the neck. 2. Patent vertebral arteries with antegrade flow bilaterally. Electronically Signed   By: Jeannine Boga M.D.   On: 02/15/2021 21:25   ECHOCARDIOGRAM COMPLETE BUBBLE STUDY  Result Date: 02/16/2021    ECHOCARDIOGRAM REPORT   Patient Name:   ALICE BADENHOP Date of Exam: 02/16/2021 Medical Rec #:  [id number]  Height:       69.0 in Accession #:    [id number] Weight:       220.0 lb Date of Birth:  1961/03/08 BSA:          2.151 m Patient Age:    73 years   BP:           147/82 mmHg Patient Gender: M          HR:           80 bpm. Exam Location:  [facility] Procedure: 2D Echo, Saline Contrast Bubble Study and Intracardiac Opacification            Agent Indications:     Stroke I63.9  History:         Patient has no prior history of Echocardiogram examinations.  Sonographer:     Arville Go RDCS Referring Phys:  [id number] Loletha Grayer Diagnosing Phys: Kate Sable MD  Sonographer  Comments: Technically difficult study due to poor echo windows. Image acquisition challenging due to patient body habitus. IMPRESSIONS  1. Left ventricular ejection fraction, by estimation, is 55 to 60%. The left ventricle has normal function. The left ventricle has no regional wall motion abnormalities. There is mild left ventricular hypertrophy. Left ventricular diastolic parameters were normal.  2. Right ventricular systolic function is normal. The right ventricular size is normal.  3. The mitral valve is normal in structure. No evidence of mitral valve regurgitation.  4. The aortic valve is tricuspid. Aortic valve regurgitation is not visualized.  5. The inferior vena  cava is dilated in size with >50% respiratory variability, suggesting right atrial pressure of 8 mmHg.  6. Saline contrast study inadequate. FINDINGS  Left Ventricle: Left ventricular ejection fraction, by estimation, is 55 to 60%. The left ventricle has normal function. The left ventricle has no regional wall motion abnormalities. Definity contrast agent was given IV to delineate the left ventricular  endocardial borders. The left ventricular internal cavity size was normal in size. There is mild left ventricular hypertrophy. Left ventricular diastolic parameters were normal. Right Ventricle: The right ventricular size is normal. No increase in right ventricular wall thickness. Right ventricular systolic function is normal. Left Atrium: Left atrial size was normal in size. Right Atrium: Right atrial size was normal in size. Pericardium: There is no evidence of pericardial effusion. Mitral Valve: The mitral valve is normal in structure. No evidence of mitral valve regurgitation. Tricuspid Valve: The tricuspid valve is normal in structure. Tricuspid valve regurgitation is not demonstrated. Aortic Valve: The aortic valve is tricuspid. Aortic valve regurgitation is not visualized. Aortic valve peak gradient measures 4.2 mmHg. Pulmonic Valve: The  pulmonic valve was not well visualized. Pulmonic valve regurgitation is not visualized. Aorta: The aortic root is normal in size and structure. Venous: The inferior vena cava is dilated in size with greater than 50% respiratory variability, suggesting right atrial pressure of 8 mmHg. IAS/Shunts: No atrial level shunt detected by color flow Doppler. Agitated saline contrast was given intravenously to evaluate for intracardiac shunting. Saline contrast study inadequate.  LEFT VENTRICLE PLAX 2D LVIDd:         4.44 cm  Diastology LVIDs:         3.05 cm  LV e' medial:    6.74 cm/s LV PW:         1.35 cm  LV E/e' medial:  9.5 LV IVS:        1.15 cm  LV e' lateral:   11.30 cm/s LVOT diam:     2.10 cm  LV E/e' lateral: 5.6 LV SV:         65 LV SV Index:   30 LVOT Area:     3.46 cm  LEFT ATRIUM           Index LA diam:      4.00 cm 1.86 cm/m LA Vol (A4C): 43.7 ml 20.31 ml/m  AORTIC VALVE                PULMONIC VALVE AV Area (Vmax): 2.66 cm    PV Vmax:       0.77 m/s AV Vmax:        102.00 cm/s PV Peak grad:  2.4 mmHg AV Peak Grad:   4.2 mmHg LVOT Vmax:      78.30 cm/s LVOT Vmean:     50.300 cm/s LVOT VTI:       0.187 m  AORTA Ao Root diam: 3.10 cm Ao Asc diam:  3.20 cm MITRAL VALVE MV Area (PHT): 7.29 cm    SHUNTS MV Decel Time: 104 msec    Systemic VTI:  0.19 m MV E velocity: 63.80 cm/s  Systemic Diam: 2.10 cm MV A velocity: 71.10 cm/s MV E/A ratio:  0.90 Kate Sable MD Electronically signed by Kate Sable MD Signature Date/Time: 02/16/2021/3:00:51 PM    Final    CT HEAD CODE STROKE WO CONTRAST`  Result Date: 02/15/2021 CLINICAL DATA:  Code stroke.  Right-sided weakness. EXAM: CT HEAD WITHOUT CONTRAST TECHNIQUE: Contiguous axial images were obtained from the base  of the skull through the vertex without intravenous contrast. COMPARISON:  None. FINDINGS: Brain: Mild atrophy. Mild white matter hypodensity bilaterally. Mild hypodensity in the pons likely chronic. Negative for acute infarct, hemorrhage,  mass. Vascular: Negative for hyperdense vessel Skull: Negative Sinuses/Orbits: Negative Other: None ASPECTS ([place] Stroke Program Early CT Score) - Ganglionic level infarction (caudate, lentiform nuclei, internal capsule, insula, M1-M3 cortex): 7 - Supraganglionic infarction (M4-M6 cortex): 3 Total score (0-10 with 10 being normal): 10 IMPRESSION: 1. No acute abnormality 2. ASPECTS is 10 3. Code stroke imaging results were communicated on 02/15/2021 at 3:41 pm to provider Bhagat via text page Electronically Signed   By: Franchot Gallo M.D.   On: 02/15/2021 15:41     Management plans discussed with the patient, family and they are in agreement.  CODE STATUS:     Code Status Orders  (From admission, onward)         Start     Ordered   02/15/21 1956  Full code  Continuous        02/15/21 1955        Code Status History    This patient has a current code status but no historical code status.   Advance Care Planning Activity      TOTAL TIME TAKING CARE OF THIS PATIENT: 35 minutes.    Loletha Grayer M.D on 02/16/2021 at 4:44 PM  Between 7am to 6pm - Pager - [phone]  After 6pm go to [email or web address] - Proofreader  [facility]  CC: Primary care physician; [facility]

## 2021-02-16 NOTE — Progress Notes (Incomplete)
*  PRELIMINARY RESULTS* Echocardiogram 2D Echocardiogram has been performed. A Bubble Study (Saline Microcavitation) was requested and performed. Definity IV Contrast was used on this study, no adverse reactions reported.  Bobby Sampson Stills 02/16/2021, 2:45 PM

## 2021-02-16 NOTE — Evaluation (Addendum)
Physical Therapy Evaluation Patient Details Name: Bobby Sampson MRN: [id number] DOB: 06-03-1961 Today's Date: 02/16/2021   History of Present Illness  Pt is a 60 y/o M admitted from home on 02/15/21 with c/c of R sided weakness. MRI revaled small acute L pontine infarct (pt outside of TPA window). PMH: essential HTN, DM2, CVA, ESRD on transplant list & transitioning to HD, HLD, neuropathy, stroke (2016)  Clinical Impression  MD cleared pt for participation in setting of elevated glucose. Pt seen for PT evaluation with pt reporting he was independent prior to admission. Pt currently requires CGA for gait without AD with slight weakness noted during functional mobility in RLE. Educated pt on risks of another stroke and recommendation of OPPT f/u & pt is agreeable. Pt would benefit from ongoing acute PT services to progress independence with gait, R LE/UE NMR, and for high level balance training.     Follow Up Recommendations Outpatient PT;Supervision for mobility/OOB    Equipment Recommendations  None recommended by PT    Recommendations for Other Services       Precautions / Restrictions Precautions Precautions: Fall Restrictions Weight Bearing Restrictions: No      Mobility  Bed Mobility               General bed mobility comments: not observed as pt received & left sitting in recliner    Transfers Overall transfer level: Needs assistance   Transfers: Sit to/from Stand Sit to Stand: Modified independent (Device/Increase time)            Ambulation/Gait Ambulation/Gait assistance: Min guard Gait Distance (Feet): 100 Feet Assistive device: None Gait Pattern/deviations: Decreased step length - right;Decreased dorsiflexion - right;Decreased weight shift to right     General Gait Details: decreased heel strike RLE, decreased hip flexion RLE during swing phase  Stairs            Wheelchair Mobility    Modified Rankin (Stroke Patients Only)        Balance Overall balance assessment: Mild deficits observed, not formally tested         Standing balance support: No upper extremity supported;During functional activity Standing balance-Leahy Scale: Fair                               Pertinent Vitals/Pain Pain Assessment: No/denies pain    Home Living Family/patient expects to be discharged to:: Private residence Living Arrangements: Spouse/significant other;Children (79 y/o daughter (62 y/o son temporarily staying with pt)) Available Help at Discharge: Available PRN/intermittently;Family (wife works as Quarry manager during the day) Type of Home: [employer] Access: [place]: One [place]: Environmental consultant - 4 wheels;Cane - quad;Cane - single point      Prior Function Level of Independence: Independent         Comments: independent without AD, 1 fall the night before admission bending over trying to stretch LE, goes to the [facility] & lifts weights, driving, on disability 2/2 stroke in 2016     Hand Dominance   Dominant Hand: Right    Extremity/Trunk Assessment   Upper Extremity Assessment Upper Extremity Assessment: Generalized weakness    Lower Extremity Assessment Lower Extremity Assessment:  (RLE heel to shin hip flexion weaker than LLE, able to weight bear during gait without buckling noted, WNL isolated dorsiflexion in sitting but decreased during gait; pt notes hx of BLE neuropathy but BLE proprioception & sensation intact to  light touch)       Communication   Communication: No difficulties  Cognition Arousal/Alertness: Awake/alert Behavior During Therapy: WFL for tasks assessed/performed Overall Cognitive Status: Within Functional Limits for tasks assessed                                        General Comments General comments (skin integrity, edema, etc.): HR 79-80 bpm at rest, up to 163 bpm with gait but quickly returns to 80 bpm    Exercises      Assessment/Plan    PT Assessment Patient needs continued PT services  PT Problem List Decreased strength;Decreased mobility;Obesity;Cardiopulmonary status limiting activity;Decreased balance       PT Treatment Interventions DME instruction;Therapeutic activities;Modalities;Gait training;Patient/family education;Therapeutic exercise;Stair training;Balance training;Functional mobility training;Neuromuscular re-education;Manual techniques    PT Goals (Current goals can be found in the Care Plan section)  Acute Rehab PT Goals Patient Stated Goal: to go home PT Goal Formulation: With patient Time For Goal Achievement: 03/02/21 Potential to Achieve Goals: Good    Frequency 7X/week   Barriers to discharge        Co-evaluation               AM-PAC PT "6 Clicks" Mobility  Outcome Measure Help needed turning from your back to your side while in a flat bed without using bedrails?: None Help needed moving from lying on your back to sitting on the side of a flat bed without using bedrails?: None Help needed moving to and from a bed to a chair (including a wheelchair)?: A Little Help needed standing up from a chair using your arms (e.g., wheelchair or bedside chair)?: None Help needed to walk in hospital room?: A Little Help needed climbing 3-5 steps with a railing? : A Little 6 Click Score: 21    End of Session Equipment Utilized During Treatment: Gait belt Activity Tolerance: Patient tolerated treatment well Patient left: in chair;with call bell/phone within reach;with chair alarm set Nurse Communication: Mobility status PT Visit Diagnosis: Muscle weakness (generalized) (M62.81);Unsteadiness on feet (R26.81);Hemiplegia and hemiparesis Hemiplegia - Right/Left: Right Hemiplegia - dominant/non-dominant: Dominant Hemiplegia - caused by: Cerebral infarction    Time: 1201-1222 PT Time Calculation (min) (ACUTE ONLY): 21 min   Charges:   PT Evaluation $PT Eval Low Complexity:  [place], PT, DPT 02/16/21, 12:38 PM   [name] 02/16/2021, 12:36 PM

## 2021-02-16 NOTE — Progress Notes (Signed)
Neurology Progress Note  Patient ID: Bobby Sampson is a 60 y.o. with PMHx significant for left pontine stroke with right-sided weakness and no residual deficits (2016), poorly controlled diabetes (A1c 12.2% in 2016, 14.3% 11/2019), hypertension, hyperlipidemia, neuropathy, retinopathy, ESRD preparing for transition to dialysis with ascites.  Initially consulted for: Code stroke  Major interval events/Subjective: No major overnight events, feels overall stable without any acute complaints  Exam: Vitals:   02/16/21 0529 02/16/21 0812  BP: (!) 148/72 (!) 163/83  Pulse: 73 75  Resp: 16 17  Temp: 99 F (37.2 C) 97.9 F (36.6 C)  SpO2: (!) 89% 97%   Gen: In bed, comfortable  Resp: non-labored breathing, no grossly audible wheezing Cardiac: Perfusing extremities well  Abd: soft, nt Psych: Affect appropriate, intermittently appears sad but smiles a lot, cooperative and pleasant  Neuro: MS: Awake, alert, oriented to person, place, situation and time CN: Face symmetric, tongue midline, EOMI Motor: Right upper and lower extremity 4 to 4+/5 throughout in an upper motor pattern of weakness, stable from 3/18 exam Sensory: Intact to light touch throughout Coordination: Intact heel-to-shin and finger-to-nose, improved from 3/18  Pertinent Labs: Lab Results  Component Value Date   HGBA1C 14.3 (H) 11/08/2019   Lab Results  Component Value Date   CHOL 109 02/16/2021   HDL 35 (L) 02/16/2021   LDLCALC 57 02/16/2021   TRIG 84 02/16/2021   CHOLHDL 3.1 02/16/2021   Lab Results  Component Value Date   TSH 1.027 02/16/2021   RPR negative, HIV negative  Carotid duplex 02/15/2021:  IMPRESSION: 1. Intimal thickening with heterogeneous plaque about the bilateral carotid bulbs, left greater than right, which results in less than 50% stenosis of the bilateral internal carotid arteries by Doppler criteria. No hemodynamically significant stenosis identified within the neck. 2. Patent vertebral  arteries with antegrade flow bilaterally.  MRA personally reviewed, agree with radiology read  IMPRESSION: 1. Negative intracranial MRA for large vessel occlusion. 2. Extensive atheromatous irregularity throughout the carotid siphons with associated moderate to severe multifocal stenoses as above, left worse than right. 3. Hypoplastic and/or absent right A1 segment, with the ACAs supplied primarily via the left carotid artery system. 4. Wide patency of the posterior circulation.  Impression: Lacunar stroke in the setting of known microvascular risk factors.  In the past he has had poor diabetes control but more recently his hemoglobin A1c has improved from 11 to 9 (care everywhere records).  Discussed extensively with the patient that his goal A1c is less than 7 and he feels he has a good understanding of the diet and exercise changes need to achieve this goal.  Feels stressors are significant barrier and discussed the potential utility of cognitive behavioral therapy or other therapy for stress management techniques to help keep him on track.  He is also awaiting CPAP arrival for his obstructive sleep apnea that represents another significant risk factor that is modifiable.  Regarding his insulin missed dose at home that he reported on admission, he tells me today that this was just because he felt too weak to walk safely to get his long-acting insulin from where it was stored.  Otherwise he reports he is very adherent to all his medications as prescribed, though he hates having to take so many medications.  Recommendations: # Left pontine recurrent stroke - Stroke labs TSH, RPR, HgbA1c, fasting lipid panel, results as above with only A1c still pending at this time - MRI brain with left pontine stroke - MRA of the  brain without contrast with significant anterior circulation intracranial atherosclerosis though posterior circulation is patent  - Carotid dopplers without hemodynamically significant  stensosis - Frequent neuro checks - Echocardiogram (ordered, pending) - continue home ASA 81 mg daily  - Plavix 300 mg load with 75 mg daily for 21 day course  - Risk factor modification, diet, exercise, weight loss, CPAP and medication adherence discussed with the patient  - Telemetry monitoring; 30 day event monitor on discharge if no arrythmias captured  - Blood pressure goal              - Normotension to be obtained gradually over several days - PT consult, OT consult, Speech consult,  - Neurology to follow up ECHO result, otherwise anticipate no further neurological workup will be indicated and we will be available on an as-needed basis  Bobby Noe MD-PhD [facility] [phone]   40 minutes were spent in direct care of this patient today, greater than 50% at bedside, with extensive counseling on modifiable risk factors as documented above

## 2021-02-17 LAB — HEMOGLOBIN A1C
Hgb A1c MFr Bld: 11 % — ABNORMAL HIGH (ref 4.8–5.6)
Mean Plasma Glucose: 269 mg/dL

## 2021-03-18 ENCOUNTER — Other Ambulatory Visit: Payer: Self-pay

## 2021-03-18 ENCOUNTER — Ambulatory Visit: Payer: Medicaid Other | Attending: Family Medicine

## 2021-03-18 DIAGNOSIS — R2689 Other abnormalities of gait and mobility: Secondary | ICD-10-CM | POA: Diagnosis present

## 2021-03-18 DIAGNOSIS — M6281 Muscle weakness (generalized): Secondary | ICD-10-CM | POA: Diagnosis present

## 2021-03-18 DIAGNOSIS — R2681 Unsteadiness on feet: Secondary | ICD-10-CM | POA: Insufficient documentation

## 2021-03-18 NOTE — Therapy (Signed)
[place] MAIN [facility] SERVICES [address] [place], [place], [address] Phone: [phone]   Fax:  [phone]  Physical Therapy Evaluation  Patient Details  Name: Bobby Sampson MRN: [id number] Date of Birth: [birth year] Referring Provider (PT): Overton Mam, MD   Encounter Date: 03/18/2021   PT End of Session - 03/18/21 1029    Visit Number 1    Number of Visits 25    Date for PT Re-Evaluation 06/10/21    PT Start Time [id number]    PT Stop Time 1100    PT Time Calculation (min) 58 min    Equipment Utilized During Treatment Gait belt    Activity Tolerance Patient tolerated treatment well    Behavior During Therapy [facility] for tasks assessed/performed           Past Medical History:  Diagnosis Date  . Diabetes mellitus without complication ([place])   . Hypertension   . Neuropathy   . Renal disorder   . Stroke [facility])    2016    Past Surgical History:  Procedure Laterality Date  . TONSILLECTOMY      There were no vitals filed for this visit.    Subjective Assessment - 03/18/21 0959    Subjective Pt reports he feels his biggest problem is R LE weakness and states "I never know when it's going to give out," when referring to R knee. He says, "I can feel my balance isn't that good."  Pt says he has difficulty with LE dressing. Pt reports he does feel some improvement in strength since recent stroke. Pt reports he is not walking like he was prior to most recent stroke. He says prior to recent stroke he was back in the gym and swimming. Pt reports he has noticed additional strength decreases and endurance decrease since he had COVID. Pt reports he has had PT before for prior stroke (2016) and that he felt his strength really improved when he started swimming. 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 Pt also says "because of my kidneys I get tired so fast." Pt presents to therapy wearing heart monitor he  received d/t study for his sleep apnea. He says his dr wants to monitor heart to make sure he doesn't have an abnormal rhythm. Pt says his dr told him it is OK to exercise.    Pertinent History Pt reports no changes in hx since last evaluated by acute PT (02/16/2021). Confirms the following is correct: "Pt is a 60 y/o M admitted from home on 02/15/21 with c/c of R sided weakness. Pt reports he was admitted for 2 days. 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 PMH: essential HTN, DM2, prior CVA, ESRD on transplant list & transitioning to HD, HLD, neuropathy, stroke (2016)"    Limitations Walking;Lifting;Standing;House hold activities    How long can you sit comfortably? not limited    How long can you stand comfortably? Pt reports about an hour    How long can you walk comfortably? "Not very far."    Diagnostic tests imaging per chart from 3/18: "02/15/2021 MG ANGIO HEAD WO CONTRAST: "IMPRESSION:  1. Negative intracranial MRA for large vessel occlusion.  2. Extensive atheromatous irregularity throughout the carotid  siphons with associated moderate to severe multifocal stenoses as  above, left worse than right.  3. Hypoplastic and/or absent right A1 segment, with the ACAs  supplied primarily via the left  carotid artery system.  4. Wide patency of the posterior circulation."    02/15/2021 US Carotid Bilateral: "IMPRESSION:  1. Intimal thickening with heterogeneous plaque about the bilateral  carotid bulbs, left greater than right, which results in less than  50% stenosis of the bilateral internal carotid arteries by Doppler  criteria. No hemodynamically significant stenosis identified within  the neck.  2. Patent vertebral arteries with antegrade flow bilaterally."    3/18 MR BRAIN WITHOUT CONTRAST: "   IMPRESSION:  6 mm acute infarct within the left pons.     Background severe chronic small vessel ischemic disease within the  pons and pontomesencephalic junction.     Mild-to-moderate  cerebral white matter chronic small vessel ischemic  disease.     Chronic left thalamic lacunar infarct.     Small chronic hemorrhage within the callosal genu on the left.     Mild generalized parenchymal atrophy.     Mild paranasal sinus disease as described."    Patient Stated Goals "I just want to be able to walk and eventually get on the treadmill."    Currently in Pain? No/denies           SUBJECTIVE Chief complaint: R LE weakness, balance, endurance Onset: 02/15/2021  Recent changes in overall health/medication: No Prior history of physical therapy for balance: yes for prior stroke Red flags (bowel/bladder changes, saddle paresthesia, personal history of cancer, chills/fever, night sweats, unrelenting pain) Negative    OBJECTIVE  MUSCULOSKELETAL: Tremor: Absent Bulk: Normal  Posture No gross abnormalities noted in standing or seated posture  Gait To be further assessed for mechanics future session. But pt with decreased gait speed (see 10MWT)  Strength R/L 4-/4 Hip flexion 5/5 Hip external rotation  4/4 Hip abduction 3+/4 Hip adduction 4/5 Knee extension 4/5 Knee flexion 5/5 Ankle Plantarflexion 4/5 Ankle Dorsiflexion   NEUROLOGICAL:  Sensation WFL BUEs and BLES except he reports decreased sensation in B finger tips and toes.   Coordination/Cerebellar Finger to Nose: WNL  FUNCTIONAL OUTCOME MEASURES  FOTO 55 (goal 67)   Results Comments  BERG 34/56 Fall risk, in need of intervention  5TSTS 12 seconds   6 Minute Walk Test DEFERRED   10 Meter Gait Speed 0.89 m/s without AD Below normative values for full community ambulation  ABC Scale 63.75%      ASSESSMENT Clinical Impression: Pt is a pleasant 60 year-old male referred to PT post CVA from 02/15/2021 affecting pt R side. Upon examination pt presents with deficits in strength, gait and balance as evidenced by MMT, gait assessment with 10MWT, and pt performance on the BERG (see note for scores). Pt also  with impairment of ABC scale and FOTO scores indicating decreased balance confidence and functional mobility. Pt will benefit from skilled PT services to address deficits in gait, LE strength and balance to decrease risk for future falls and to improve functional mobility.   PLAN Next Visit: provide HEP, initiate strength exercises, 6MWT HEP: to be initiated next visit   Objective measurements completed on examination: See above findings.     Patient will benefit from skilled therapeutic intervention in order to improve the following deficits and impairments:  Abnormal gait,Decreased activity tolerance,Decreased endurance,Decreased range of motion,Decreased strength,Hypomobility,Decreased balance,Decreased mobility,Difficulty walking,Impaired flexibility,Improper body mechanics,Impaired UE functional use  Visit Diagnosis: Other abnormalities of gait and mobility  Muscle weakness (generalized)  Unsteadiness on feet     Plan - 03/18/21 1030    Clinical Impression Statement Pt is a pleasant 60 year-old  male referred to PT post CVA from 02/15/2021 affecting pt R side. Upon examination pt presents with deficits in strength, gait and balance as evidenced by MMT, gait assessment with 10MWT, and pt performance on the BERG (see note for scores). Pt also with impairment of ABC scale and FOTO scores indicating decreased balance confidence and functional mobility. Pt will benefit from skilled PT services to address deficits in gait, LE strength and balance to decrease risk for future falls and to improve functional mobility.    Personal Factors and Comorbidities Comorbidity 1;Comorbidity 2;Comorbidity 3+;Past/Current Experience;Other   prior stroke also affecting R side   Comorbidities essential HTN, DM2, CVA, ESRD on transplant list & transitioning to HD, HLD, neuropathy, stroke (2016)    Examination-Activity Limitations Bathing;Carry;Lift;Stand;Locomotion Level;Dressing;Reach  Overhead;Squat;Transfers;Caring for Others;Hygiene/Grooming;Stairs;Bend    Examination-Participation Restrictions Cleaning;Community Activity;Laundry;Yard Work;Driving;Shop;Volunteer    Stability/Clinical Decision Making Evolving/Moderate complexity    Clinical Decision Making Moderate    Rehab Potential Good    PT Frequency 2x / week    PT Duration 12 weeks    PT Treatment/Interventions ADLs/Self Care Home Management;Aquatic Therapy;Biofeedback;Cryotherapy;Canalith Repostioning;Traction;Moist Heat;Ultrasound;DME Instruction;Gait training;Stair training;Functional mobility training;Therapeutic activities;Therapeutic exercise;Balance training;Neuromuscular re-education;Patient/family education;Orthotic Fit/Training;Manual techniques;Wheelchair mobility training;Passive range of motion;Energy conservation;Taping;Joint Manipulations;Electrical Stimulation    PT Next Visit Plan initiate strengthening, HEP    PT Home Exercise Plan to be initiated next session    Consulted and Agree with Plan of Care Patient             Problem List Patient Active Problem List   Diagnosis Date Noted  . Stroke ([place]) 02/16/2021  . CKD (chronic kidney disease), stage IV ([place])   . Sleep apnea   . Obesity (BMI 30.0-34.9)   . Acute CVA (cerebrovascular accident) ([place]) 02/15/2021  . Essential hypertension   . Type 2 diabetes mellitus with hyperglycemia, with long-term current use of insulin ([place])   . AKI (acute kidney injury) ([place])   . COVID-19 11/07/2019   Ricard Dillon PT, DPT 03/18/2021, 2:20 PM  [place] MAIN [facility] SERVICES [address] [place], [place], [address] Phone: [phone]   Fax:  [phone]  Name: Elza Orn MRN: [id number] Date of Birth: 1961-03-20

## 2021-03-20 ENCOUNTER — Ambulatory Visit: Payer: Medicaid Other

## 2021-03-25 ENCOUNTER — Ambulatory Visit: Payer: Medicaid Other

## 2021-03-27 ENCOUNTER — Ambulatory Visit: Payer: Medicaid Other

## 2021-04-02 ENCOUNTER — Ambulatory Visit: Payer: Medicaid Other

## 2021-04-02 ENCOUNTER — Other Ambulatory Visit: Payer: Self-pay

## 2021-04-02 ENCOUNTER — Encounter: Payer: Self-pay | Admitting: Occupational Therapy

## 2021-04-02 ENCOUNTER — Ambulatory Visit: Payer: Medicaid Other | Attending: Family Medicine | Admitting: Occupational Therapy

## 2021-04-02 DIAGNOSIS — R278 Other lack of coordination: Secondary | ICD-10-CM | POA: Diagnosis present

## 2021-04-02 DIAGNOSIS — M6281 Muscle weakness (generalized): Secondary | ICD-10-CM | POA: Diagnosis not present

## 2021-04-02 DIAGNOSIS — R2681 Unsteadiness on feet: Secondary | ICD-10-CM | POA: Insufficient documentation

## 2021-04-02 DIAGNOSIS — R2689 Other abnormalities of gait and mobility: Secondary | ICD-10-CM | POA: Diagnosis present

## 2021-04-02 NOTE — Therapy (Signed)
[place] MAIN [facility] SERVICES [address] [place], [place], [address] Phone: [phone]   Fax:  [phone]  Physical Therapy Treatment  Patient Details  Name: Bobby Sampson MRN: [id number] Date of Birth: November 11, 1961 Referring Provider (PT): Overton Mam, MD   Encounter Date: 04/02/2021   PT End of Session - 04/02/21 1536    Visit Number 2    Number of Visits 25    Date for PT Re-Evaluation 06/10/21    Authorization Time Period 03/18/21-06/10/21    PT Start Time 1520   coming straight from OT   PT Stop Time 1600    PT Time Calculation (min) 40 min    Equipment Utilized During Treatment Gait belt    Activity Tolerance Patient tolerated treatment well;Patient limited by pain;Patient limited by fatigue    Behavior During Therapy [facility] for tasks assessed/performed           Past Medical History:  Diagnosis Date  . Diabetes mellitus without complication ([place])   . Hypertension   . Neuropathy   . Renal disorder   . Stroke [facility])    2016    Past Surgical History:  Procedure Laterality Date  . TONSILLECTOMY      There were no vitals filed for this visit.   Subjective Assessment - 04/02/21 1521    Subjective Pt doing well, reports doing well. He went to [facility] on vacation and has a good time. He reports he is wearing a monitor for 4 weeks which he is ready to turn in and he will be transitioning to OP HD soon. Pt was evlauated by OPOT just prior to this visit.    Pertinent History Pt reports no changes in hx since last evaluated by acute PT (02/16/2021). Confirms the following is correct: "Pt is a 60 y/o M admitted from home on 02/15/21 with c/c of R sided weakness. Pt reports he was admitted for 2 days. 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 PMH: essential HTN, DM2, prior CVA, ESRD on transplant list & transitioning to HD, HLD, neuropathy, stroke (2016)"    Currently in Pain? No/denies   some general pain in  Right shoulder, stiffness thinks it may be arthritis             [facility] PT Assessment - 04/02/21 1908      Ambulation/Gait   Ambulation Distance (Feet) 740 Feet    Assistive device Straight cane   carrying but not using   Gait Pattern Trendelenburg   left trendelenburg with worsening pain over after 3-4 minutes   Gait Comments 6MWT;   pt stops at 4:30 due to leg fatigue/tightness, then resumes at 5:00          INTERVENTION:  -Single point cane training: emphasis on RUE use synchronized with LLE to improve left hip pain with continuous gait. Pt has formerly used cane in LUE paired with RLE due to post-cva RLE weakness, however he performs 6MWT without any SPC at all and has been largely noncomplicant with SPC outside of recent therapy. Pt likely to have more consistent continuous gait patterns if he is able to consistently use LLE without pain that forces increased loading of weaker RLE. Pt demonstrates quick learning of gait pattern, reports high effectiveness in pain reduction.   -STS from chair, hands-free 1x10: excellent form, no LOB, requires concerted effort and focus per patient *added to HEP   -Tandem Stance Balance, minGuard Assist: cannot maintain  without almost immediate repeat LOB  -Semitandem Stance Balance: Frequent LOB <2-3 seconds, appears unsafe for home, too difficult to provide benefit  -Semitandem stance balance, heel at toe line, feet 1.5 inches apart: 3x10sec bilat, chair on each side for intermittent safety support.  *added to HEP   -Narrow stance on firm surface 1x30sec: moderate sway without LOB, pt reports this requires effort and focus *added to HEP        [facility] Adult PT Treatment/Exercise - 04/02/21 1908      Blood Flow Restriction   Blood Flow Restriction Yes      Blood Flow Restriction-Positions    Blood Flow Restriction Position comment    BFR comment Not performed      Exercises   Exercises Other Exercises                  PT  Education - 04/02/21 1911    Education Details [facility] Gait training for Left hip antalgia; HEP set up for home    Person(s) Educated Patient    Methods Explanation;Handout;Tactile cues    Comprehension Verbalized understanding;Returned demonstration            PT Short Term Goals - 03/18/21 1026      PT SHORT TERM GOAL #1   Title Patient will be independent in home exercise program to improve strength/mobility for better functional independence with ADLs.    Baseline 4/18: to be initiated    Time 6    Period Weeks    Status New    Target Date 04/29/21             PT Long Term Goals - 03/18/21 1027      PT LONG TERM GOAL #1   Title Patient will increase FOTO score to equal to or greater than 67 to demonstrate statistically significant improvement in mobility and quality of life.    Baseline 4/18: 55%    Time 12    Period Weeks    Status New    Target Date 06/10/21      PT LONG TERM GOAL #2   Title Patient will increase 10 meter walk test to >1.43ms as to improve gait speed for better community ambulation and to reduce fall risk.    Baseline 4/18: 0.89 m/s    Time 12    Period Weeks    Status New    Target Date 06/10/21      PT LONG TERM GOAL #3   Title Patient will increase BLE gross strength to 4+/5 as to improve functional strength for independent gait, increased standing tolerance and increased ADL ability.    Baseline 4/18: BLEs grossly 4/5    Time 12    Period Weeks    Status New    Target Date 06/10/21      PT LONG TERM GOAL #4   Title Patient will increase Berg Balance score by > 6 points to demonstrate decreased fall risk during functional activities.    Baseline 4/18: 34/56    Time 12    Period Weeks    Status New    Target Date 06/10/21      PT LONG TERM GOAL #5   Title --    Baseline --    Period --    Status --    Target Date --                 Plan - 04/02/21 1913    Clinical Impression Statement 10MWT performed,  significant decline  in tolerance of left stance due to hip pain progression, noted frequent imbalance with turning, has tightness, fatigue and pain that limit mobility to <868f. [facility]. HEP established with 1 strength activity and 2 balance activities. Pt will continue to benefit from skilled PT intervention to maximize safety, independence, and tolerance to ADL, IADL, and leisure activity.    Personal Factors and Comorbidities Comorbidity 1;Comorbidity 2;Comorbidity 3+;Past/Current Experience;Other    Comorbidities essential HTN, DM2, CVA, ESRD on transplant list & transitioning to HD, HLD, neuropathy, stroke (2016)    Examination-Activity Limitations Bathing;Carry;Lift;Stand;Locomotion Level;Dressing;Reach Overhead;Squat;Transfers;Caring for Others;Hygiene/Grooming;Stairs;Bend    Examination-Participation Restrictions Cleaning;Community Activity;Laundry;Yard Work;Driving;Shop;Volunteer    Stability/Clinical Decision Making Evolving/Moderate complexity    Clinical Decision Making Moderate    Rehab Potential Good    PT Frequency 2x / week    PT Duration 12 weeks    PT Treatment/Interventions ADLs/Self Care Home Management;Aquatic Therapy;Biofeedback;Cryotherapy;Canalith Repostioning;Traction;Moist Heat;Ultrasound;DME Instruction;Gait training;Stair training;Functional mobility training;Therapeutic activities;Therapeutic exercise;Balance training;Neuromuscular re-education;Patient/family education;Orthotic Fit/Training;Manual techniques;Wheelchair mobility training;Passive range of motion;Energy conservation;Taping;Joint Manipulations;Electrical Stimulation    PT Next Visit Plan Review HEP, SPC traininig for Left hip pain, initiate strength program    PT Home Exercise Plan Narrow stance firm surface, semi tandem stance, 10xSTS hands free    Consulted and Agree with Plan of Care Patient;Family member/caregiver    Family Member Consulted wife           Patient will benefit from skilled  therapeutic intervention in order to improve the following deficits and impairments:  Abnormal gait,Decreased activity tolerance,Decreased endurance,Decreased range of motion,Decreased strength,Hypomobility,Decreased balance,Decreased mobility,Difficulty walking,Impaired flexibility,Improper body mechanics,Impaired UE functional use  Visit Diagnosis: Muscle weakness (generalized)  Other lack of coordination  Other abnormalities of gait and mobility  Unsteadiness on feet     Problem List Patient Active Problem List   Diagnosis Date Noted  . Stroke ([place] 02/16/2021  . CKD (chronic kidney disease), stage IV ([place]   . Sleep apnea   . Obesity (BMI 30.0-34.9)   . Acute CVA (cerebrovascular accident) ([place] 02/15/2021  . Essential hypertension   . Type 2 diabetes mellitus with hyperglycemia, with long-term current use of insulin ([place]   . AKI (acute kidney injury) ([place]   . COVID-19 11/07/2019   7:30 PM, 04/02/21 AEtta Grandchild PT, DPT Physical Therapist - [facility]- [phone]    BEtta Grandchild5/01/2021, 7:17 PM  [facility]MAIN [facility] [address] [address]Phone: [phone]  Fax:  [phone] Name: Bobby DickelMRN: [id number] of Birth: 107/23/1962

## 2021-04-02 NOTE — Therapy (Signed)
[place] MAIN [facility] SERVICES [address] [place], [place], [address] Phone: [phone]   Fax:  [phone]  Occupational Therapy Evaluation  Patient Details  Name: Bobby Sampson MRN: [id number] Date of Birth: October 10, 1961 Referring Provider (OT): Lovena Neighbours   Encounter Date: 04/02/2021   OT End of Session - 04/02/21 1656    Visit Number 1    Number of Visits 24    Date for OT Re-Evaluation 06/25/21    Authorization Time Period Progress reporting period starting 04/02/2021    OT Start Time 1430    OT Stop Time 1515    OT Time Calculation (min) 45 min    Activity Tolerance Patient tolerated treatment well    Behavior During Therapy [facility] for tasks assessed/performed           Past Medical History:  Diagnosis Date  . Diabetes mellitus without complication ([place])   . Hypertension   . Neuropathy   . Renal disorder   . Stroke [facility])    2016    Past Surgical History:  Procedure Laterality Date  . TONSILLECTOMY      There were no vitals filed for this visit.   Subjective Assessment - 04/02/21 1659    Subjective  Pt. was late for the initial evaluation today.    Patient is accompanied by: Family member    Pertinent History Pt. is a 60 y.o. male who was admitted to [facility] on 02/15/2021 with a CVA with right sided weakness. Pt. has ESRD, and is planning to start dialysis after his heart monitor has been removed. Pt. is starting the process for a kidney transplant. Pt. has a history of a remote CVA with right-sided weakness, DM, HTN, Glaucoma.    Patient Stated Goals To regain independence    Currently in Pain? Yes    Pain Score 4     Pain Location Shoulder    Pain Orientation Right    Pain Descriptors / Indicators Aching    Pain Type Acute pain    Pain Onset More than a month ago             [facility] OT Assessment - 04/02/21 1641      Assessment   Medical Diagnosis Left Pontine Infarct with Right sided weakness    Referring  Provider (OT) Lovena Neighbours    Onset Date/Surgical Date 02/15/21    Hand Dominance Right    Next MD Visit 3 months    Prior Therapy Previous CVA      Precautions   Precautions Fall      Restrictions   Weight Bearing Restrictions No      Balance Screen   Has the patient fallen in the past 6 months Yes    How many times? Multiple Falls      Home  Environment   Family/patient expects to be discharged to: Private residence    Living Arrangements Spouse/significant other    Available Help at Discharge Family    Type of Fidelity One level    Bathroom Shower/Tub Tub/Shower unit    [place] - 2 wheels;Cane - single point;Bedside commode;Shower seat;Grab bars - tub/shower    Lives With Spouse      Prior Function   Level of Independence Independent    Vocation Retired      ADL  Eating/Feeding Independent    Grooming Independent    Upper Body Bathing Independent    Lower Body Bathing Minimal assistance    Upper Body Dressing Independent    Lower Body Dressing Moderate assistance    Toilet Transfer Independent    Toileting - Clothing Manipulation Independent    Toileting -  Hygiene Independent      IADL   Prior Level of Function Shopping Independent    Shopping Needs to be accompanied on any shopping trip    Prior Level of Function Light Housekeeping Independent    Light Housekeeping Needs help with all home maintenance tasks;Performs light daily tasks such as dishwashing, bed making    Prior Level of Function Meal Prep Independent    Meal Prep Plans, prepares and serves adequate meals independently    Prior Level of Function Community Mobility Independent    Community Mobility Relies on family or friends for transportation    Prior Level of Function Medication Managment Independent    Medication Management Is not capable of dispensing or  managing own medication    Prior Level of Function Financial Management Independent    Financial Management Requires assistance      Vision - History   Visual History Glaucoma      Activity Tolerance   Activity Tolerance Tolerates < 10 min activity with changes in vital signs      Cognition   Overall Cognitive Status Within Functional Limits for tasks assessed      Sensation   Light Touch Appears Intact   History of peripheral neuropathy   Proprioception Appears Intact      Coordination   Gross Motor Movements are Fluid and Coordinated Yes    Fine Motor Movements are Fluid and Coordinated No    Right 9 Hole Peg Test 1 min. & 17 sec.    Left 9 Hole Peg Test 41 sec.      Strength   Overall Strength Comments Right shoulder flexion, and abduction 4/5, elbow flexion, extension, and wrist extension 5/5; Left  5/5 overall      Hand Function   Right Hand Grip (lbs) 32    Right Hand Lateral Pinch 7 lbs    Right Hand 3 Point Pinch 4 lbs    Left Hand Grip (lbs) 52    Left Hand Lateral Pinch 9 lbs    Left 3 point pinch 4 lbs                           OT Education - 04/02/21 1657    Education Details OT services, POC, goals    Person(s) Educated Patient    Methods Explanation    Comprehension Verbalized understanding               OT Long Term Goals - 04/02/21 1719      OT LONG TERM GOAL #1   Title Pt. will perform LE dressing with Modified Independence    Baseline Eval: ModA    Time 12    Period Weeks    Status New    Target Date 06/18/21      OT LONG TERM GOAL #2   Title Pt. will improve FOTO score by 2 points for clinically significant functional outcomes    Baseline EVal: FOTOscore 61    Time 12    Period Weeks    Status New    Target Date 06/18/21      OT LONG TERM  GOAL #3   Title Pt. will improve RUE strength by 1 mm grade to assist with ADLs, and IADLs    Baseline Eval: RUE strength: shoulder flexion, abduction 4/5, elbow flexion,  extension, and wrist extension 5/5    Time 12    Period Weeks    Status New    Target Date 06/18/21      OT LONG TERM GOAL #4   Title Pt. will improve right grip strength by 10# to be able to open jars and containers.    Baseline Eval: R: 32#, L: 52#    Time 12    Period Weeks    Status New    Target Date 06/18/21      OT LONG TERM GOAL #5   Title Pt. will improve right [facility] skills by 5 sec. to be able pick up small objects, and button buttons.    Baseline Eval: To    Time 45    Period Weeks    Status New    Target Date 06/18/21                 Plan - 04/02/21 1708    Clinical Impression Statement Pt. is a 60 y.o. male who wad diagnosed with a CVA with right sided weakness. Pt. presents with right sided weakness, history of falls from his right LE "giving way", impaired right hand [facility] skills, 4/10 right shoulder pain, Glaucoma, and decreased activity tolerance which limits his ability to complete LE dressing, donning socks, opening jars and containers, picking up small objects, and perfroming home management tasks, Pt. will benefit from OT services to work on improving UE strength, and [facility] skills, and activity tolerance in order to work towards improving, and maximizing independence with ADLs, and IADL tasks.    Occupational performance deficits (Please refer to evaluation for details): ADL's;IADL's;Work;Leisure    Body Structure / Function / Physical Skills FMC;Coordination;ADL;UE functional use;Strength;ROM;Pain;IADL    Rehab Potential Good    Clinical Decision Making Multiple treatment options, significant modification of task necessary    Comorbidities Affecting Occupational Performance: Presence of comorbidities impacting occupational performance    Comorbidities impacting occupational performance description: ESRD    Modification or Assistance to Complete Evaluation  Max significant modification of tasks or assist is necessary to complete    OT Frequency 2x / week    OT  Duration 12 weeks    OT Treatment/Interventions Self-care/ADL training;Therapeutic exercise;DME and/or AE instruction;Neuromuscular education;Therapeutic activities;Patient/family education    Consulted and Agree with Plan of Care Patient           Patient will benefit from skilled therapeutic intervention in order to improve the following deficits and impairments:   Body Structure / Function / Physical Skills: FMC,Coordination,ADL,UE functional use,Strength,ROM,Pain,IADL       Visit Diagnosis: Muscle weakness (generalized)  Other lack of coordination    Problem List Patient Active Problem List   Diagnosis Date Noted  . Stroke ([place]) 02/16/2021  . CKD (chronic kidney disease), stage IV ([place])   . Sleep apnea   . Obesity (BMI 30.0-34.9)   . Acute CVA (cerebrovascular accident) ([place]) 02/15/2021  . Essential hypertension   . Type 2 diabetes mellitus with hyperglycemia, with long-term current use of insulin ([place])   . AKI (acute kidney injury) ([place])   . COVID-19 11/07/2019    Harrel Carina, MS, OTR/L 04/02/2021, 5:32 PM  [place] MAIN [facility] SERVICES [address] [place], [place], [address] Phone: [phone]  Fax:  [phone]  Name: Quayshaun Wizner MRN: [id number] Date of Birth: 01-22-1961

## 2021-04-04 ENCOUNTER — Ambulatory Visit: Payer: Medicaid Other

## 2021-04-09 ENCOUNTER — Ambulatory Visit: Payer: Medicaid Other

## 2021-04-11 ENCOUNTER — Ambulatory Visit: Payer: Medicaid Other

## 2021-04-16 ENCOUNTER — Ambulatory Visit: Payer: Medicaid Other | Admitting: Occupational Therapy

## 2021-04-16 ENCOUNTER — Ambulatory Visit: Payer: Medicaid Other

## 2021-04-22 ENCOUNTER — Ambulatory Visit: Payer: Medicaid Other

## 2021-04-24 ENCOUNTER — Ambulatory Visit: Payer: Medicaid Other

## 2021-05-06 ENCOUNTER — Ambulatory Visit: Payer: Medicaid Other

## 2021-05-06 ENCOUNTER — Encounter: Payer: Medicaid Other | Admitting: Occupational Therapy

## 2021-05-16 ENCOUNTER — Encounter: Payer: Medicaid Other | Admitting: Occupational Therapy

## 2021-05-16 ENCOUNTER — Ambulatory Visit: Payer: Medicaid Other

## 2021-05-23 ENCOUNTER — Encounter: Payer: Medicaid Other | Admitting: Occupational Therapy

## 2021-05-23 ENCOUNTER — Ambulatory Visit: Payer: Medicaid Other

## 2021-05-29 ENCOUNTER — Encounter: Payer: Medicaid Other | Admitting: Occupational Therapy

## 2021-05-29 ENCOUNTER — Ambulatory Visit: Payer: Medicaid Other

## 2021-11-02 ENCOUNTER — Inpatient Hospital Stay: Payer: Medicare Other

## 2021-11-02 ENCOUNTER — Emergency Department: Payer: Medicare Other

## 2021-11-02 ENCOUNTER — Other Ambulatory Visit: Payer: Self-pay

## 2021-11-02 ENCOUNTER — Inpatient Hospital Stay: Payer: Medicare Other | Admitting: Certified Registered"

## 2021-11-02 ENCOUNTER — Encounter: Payer: Self-pay | Admitting: Emergency Medicine

## 2021-11-02 ENCOUNTER — Encounter
Admission: EM | Disposition: A | Discharge status: Home Health | Payer: Self-pay | Source: Home / Self Care | Attending: Internal Medicine

## 2021-11-02 ENCOUNTER — Inpatient Hospital Stay
Admission: EM | Admit: 2021-11-02 | Discharge: 2021-11-06 | DRG: 480 | Disposition: A | Discharge status: Home Health | Payer: Medicare Other | Attending: Hospitalist | Admitting: Hospitalist

## 2021-11-02 DIAGNOSIS — Z79899 Other long term (current) drug therapy: Secondary | ICD-10-CM | POA: Diagnosis not present

## 2021-11-02 DIAGNOSIS — S72009A Fracture of unspecified part of neck of unspecified femur, initial encounter for closed fracture: Secondary | ICD-10-CM

## 2021-11-02 DIAGNOSIS — S72001A Fracture of unspecified part of neck of right femur, initial encounter for closed fracture: Secondary | ICD-10-CM | POA: Diagnosis not present

## 2021-11-02 DIAGNOSIS — I639 Cerebral infarction, unspecified: Secondary | ICD-10-CM | POA: Diagnosis not present

## 2021-11-02 DIAGNOSIS — Z888 Allergy status to other drugs, medicaments and biological substances status: Secondary | ICD-10-CM | POA: Diagnosis not present

## 2021-11-02 DIAGNOSIS — Z841 Family history of disorders of kidney and ureter: Secondary | ICD-10-CM

## 2021-11-02 DIAGNOSIS — I10 Essential (primary) hypertension: Secondary | ICD-10-CM

## 2021-11-02 DIAGNOSIS — W010XXA Fall on same level from slipping, tripping and stumbling without subsequent striking against object, initial encounter: Secondary | ICD-10-CM | POA: Diagnosis present

## 2021-11-02 DIAGNOSIS — Z8616 Personal history of COVID-19: Secondary | ICD-10-CM | POA: Diagnosis not present

## 2021-11-02 DIAGNOSIS — I69351 Hemiplegia and hemiparesis following cerebral infarction affecting right dominant side: Secondary | ICD-10-CM | POA: Diagnosis not present

## 2021-11-02 DIAGNOSIS — K219 Gastro-esophageal reflux disease without esophagitis: Secondary | ICD-10-CM

## 2021-11-02 DIAGNOSIS — Z20822 Contact with and (suspected) exposure to covid-19: Secondary | ICD-10-CM | POA: Diagnosis present

## 2021-11-02 DIAGNOSIS — Z833 Family history of diabetes mellitus: Secondary | ICD-10-CM

## 2021-11-02 DIAGNOSIS — I5032 Chronic diastolic (congestive) heart failure: Secondary | ICD-10-CM | POA: Diagnosis present

## 2021-11-02 DIAGNOSIS — F32A Depression, unspecified: Secondary | ICD-10-CM | POA: Diagnosis present

## 2021-11-02 DIAGNOSIS — Z419 Encounter for procedure for purposes other than remedying health state, unspecified: Secondary | ICD-10-CM

## 2021-11-02 DIAGNOSIS — E1165 Type 2 diabetes mellitus with hyperglycemia: Secondary | ICD-10-CM | POA: Diagnosis present

## 2021-11-02 DIAGNOSIS — S72141A Displaced intertrochanteric fracture of right femur, initial encounter for closed fracture: Principal | ICD-10-CM | POA: Diagnosis present

## 2021-11-02 DIAGNOSIS — Z7982 Long term (current) use of aspirin: Secondary | ICD-10-CM | POA: Diagnosis not present

## 2021-11-02 DIAGNOSIS — M898X9 Other specified disorders of bone, unspecified site: Secondary | ICD-10-CM | POA: Diagnosis present

## 2021-11-02 DIAGNOSIS — Z992 Dependence on renal dialysis: Secondary | ICD-10-CM

## 2021-11-02 DIAGNOSIS — Z8249 Family history of ischemic heart disease and other diseases of the circulatory system: Secondary | ICD-10-CM

## 2021-11-02 DIAGNOSIS — N2581 Secondary hyperparathyroidism of renal origin: Secondary | ICD-10-CM | POA: Diagnosis present

## 2021-11-02 DIAGNOSIS — N186 End stage renal disease: Secondary | ICD-10-CM

## 2021-11-02 DIAGNOSIS — E1140 Type 2 diabetes mellitus with diabetic neuropathy, unspecified: Secondary | ICD-10-CM | POA: Diagnosis present

## 2021-11-02 DIAGNOSIS — G4733 Obstructive sleep apnea (adult) (pediatric): Secondary | ICD-10-CM | POA: Diagnosis present

## 2021-11-02 DIAGNOSIS — I132 Hypertensive heart and chronic kidney disease with heart failure and with stage 5 chronic kidney disease, or end stage renal disease: Secondary | ICD-10-CM | POA: Diagnosis present

## 2021-11-02 DIAGNOSIS — E785 Hyperlipidemia, unspecified: Secondary | ICD-10-CM | POA: Diagnosis present

## 2021-11-02 DIAGNOSIS — W19XXXA Unspecified fall, initial encounter: Secondary | ICD-10-CM

## 2021-11-02 DIAGNOSIS — E1122 Type 2 diabetes mellitus with diabetic chronic kidney disease: Secondary | ICD-10-CM | POA: Diagnosis present

## 2021-11-02 DIAGNOSIS — D631 Anemia in chronic kidney disease: Secondary | ICD-10-CM

## 2021-11-02 DIAGNOSIS — E871 Hypo-osmolality and hyponatremia: Secondary | ICD-10-CM | POA: Diagnosis present

## 2021-11-02 DIAGNOSIS — Z794 Long term (current) use of insulin: Secondary | ICD-10-CM

## 2021-11-02 DIAGNOSIS — E1142 Type 2 diabetes mellitus with diabetic polyneuropathy: Secondary | ICD-10-CM | POA: Diagnosis present

## 2021-11-02 DIAGNOSIS — E1129 Type 2 diabetes mellitus with other diabetic kidney complication: Secondary | ICD-10-CM | POA: Diagnosis present

## 2021-11-02 DIAGNOSIS — R739 Hyperglycemia, unspecified: Secondary | ICD-10-CM

## 2021-11-02 HISTORY — PX: INTRAMEDULLARY (IM) NAIL INTERTROCHANTERIC: SHX5875

## 2021-11-02 LAB — GLUCOSE, CAPILLARY
Glucose-Capillary: 172 mg/dL — ABNORMAL HIGH (ref 70–99)
Glucose-Capillary: 137 mg/dL — ABNORMAL HIGH (ref 70–99)
Glucose-Capillary: 215 mg/dL — ABNORMAL HIGH (ref 70–99)

## 2021-11-02 LAB — BASIC METABOLIC PANEL
Anion gap: 6 (ref 5–15)
BUN: 22 mg/dL (ref 8–23)
CO2: 30 mmol/L (ref 22–32)
Calcium: 8.3 mg/dL — ABNORMAL LOW (ref 8.9–10.3)
Chloride: 96 mmol/L — ABNORMAL LOW (ref 98–111)
Creatinine, Ser: 3.76 mg/dL — ABNORMAL HIGH (ref 0.61–1.24)
GFR, Estimated: 17 mL/min — ABNORMAL LOW (ref >60)
Glucose, Bld: 416 mg/dL — ABNORMAL HIGH (ref 70–99)
Potassium: 3.8 mmol/L (ref 3.5–5.1)
Sodium: 132 mmol/L — ABNORMAL LOW (ref 135–145)

## 2021-11-02 LAB — CBC
HCT: 37.2 % — ABNORMAL LOW (ref 39.0–52.0)
Hemoglobin: 11.9 g/dL — ABNORMAL LOW (ref 13.0–17.0)
MCH: 26.6 pg (ref 26.0–34.0)
MCHC: 32 g/dL (ref 30.0–36.0)
MCV: 83 fL (ref 80.0–100.0)
Platelets: 200 10*3/uL (ref 150–400)
RBC: 4.48 MIL/uL (ref 4.22–5.81)
RDW: 14.1 % (ref 11.5–15.5)
WBC: 9.8 10*3/uL (ref 4.0–10.5)
nRBC: 0 % (ref 0.0–0.2)

## 2021-11-02 LAB — TYPE AND SCREEN
ABO/RH(D): O POS
Antibody Screen: NEGATIVE

## 2021-11-02 LAB — PROTIME-INR
INR: 1.1 (ref 0.8–1.2)
Prothrombin Time: 13.8 seconds (ref 11.4–15.2)

## 2021-11-02 LAB — APTT: aPTT: 28 seconds (ref 24–36)

## 2021-11-02 LAB — CBG MONITORING, ED: Glucose-Capillary: 272 mg/dL — ABNORMAL HIGH (ref 70–99)

## 2021-11-02 LAB — RESP PANEL BY RT-PCR (FLU A&B, COVID) ARPGX2
Influenza A by PCR: NEGATIVE
Influenza B by PCR: NEGATIVE
SARS Coronavirus 2 by RT PCR: NEGATIVE

## 2021-11-02 IMAGING — CT CT HIP*R* W/O CM
2 of 6 series · 14 of 46 positions shown, 19 images · non-contrast
Comparison: Radiographs [DATE]

CLINICAL DATA: Right hip fracture

EXAM:
CT OF THE RIGHT HIP WITHOUT CONTRAST
3D ACQUISITION WORKSTATION RECONSTRUCTION
TECHNIQUE: Multidetector CT imaging of the right hip was performed according to
the standard protocol. Multiplanar CT image reconstructions were
also generated. Three-dimensional reconstructions of the right hip
and associated fracture were performed on the acquisition
workstation.

[Series 3: axial st · axial · 0.47mm/px · z∈[-1323,-1141]mm · 11 of 105 slices shown, 16 images]
[im 7/105  soft-tissue]
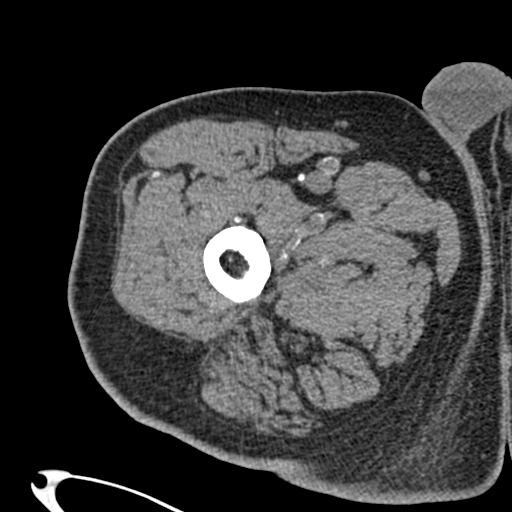
[im 7/105  bone]
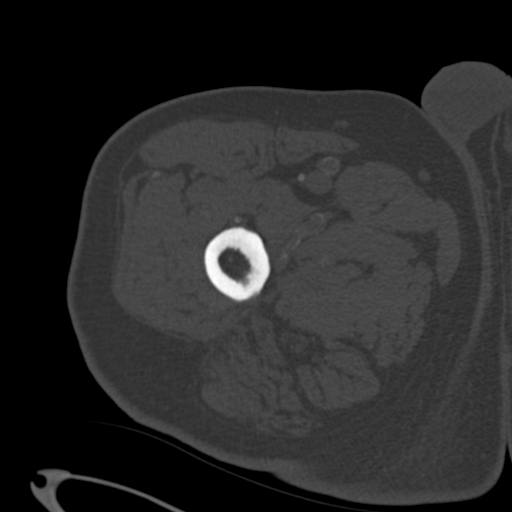
[im 21/105  soft-tissue]
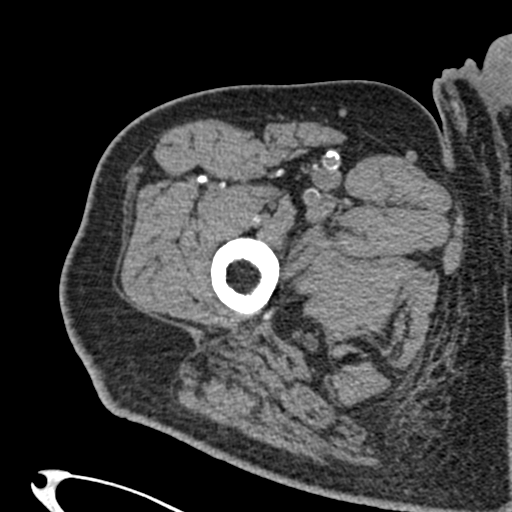
[im 28/105  soft-tissue]
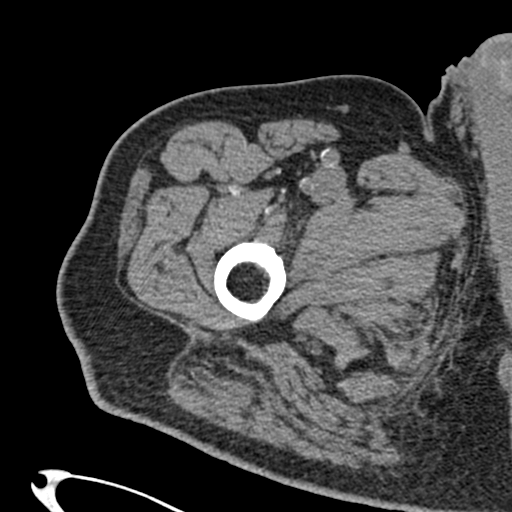
[im 35/105  soft-tissue]
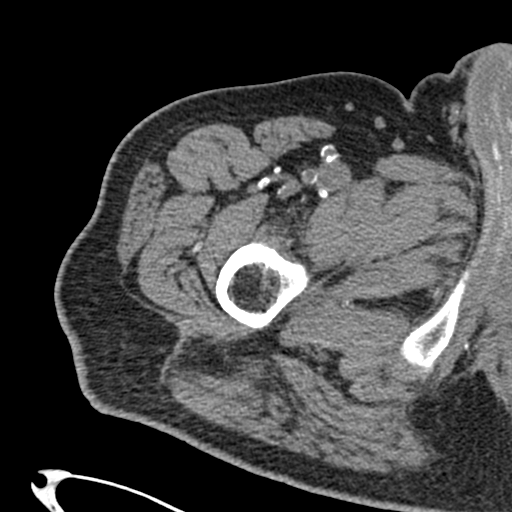
[im 49/105  soft-tissue]
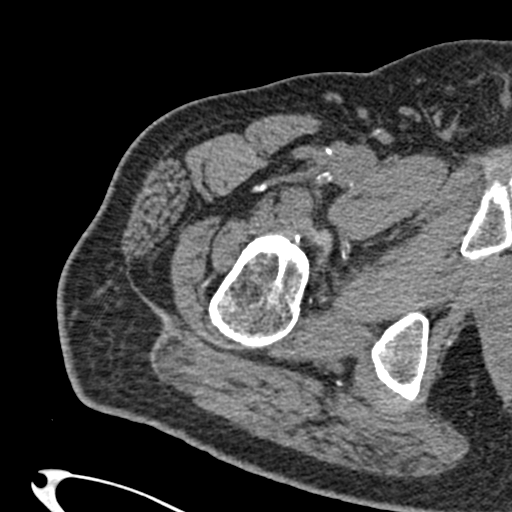
[im 56/105  soft-tissue]
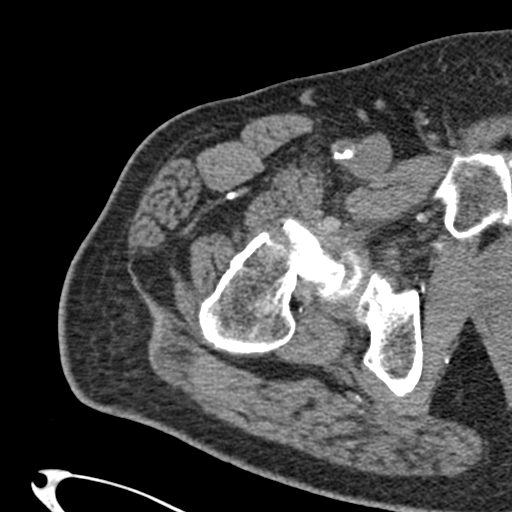
[im 70/105  soft-tissue]
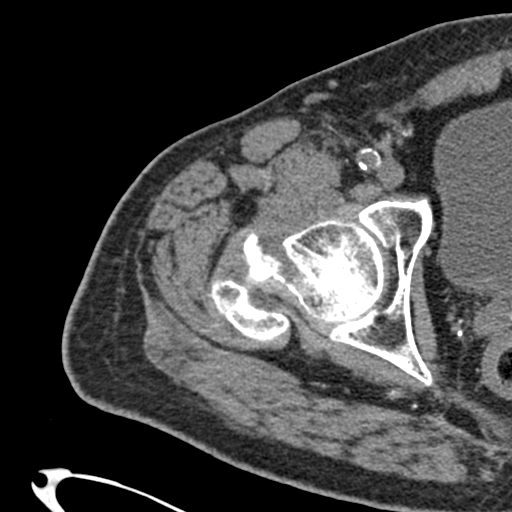
[im 77/105  soft-tissue]
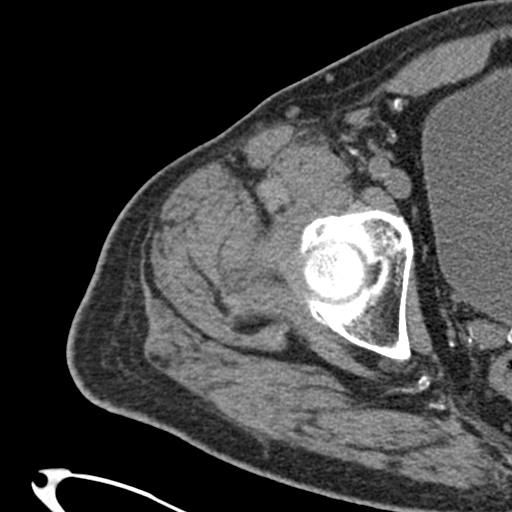
[im 77/105  lung]
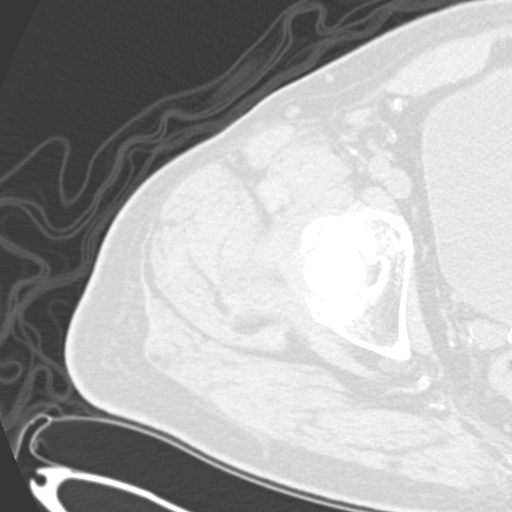
[im 84/105  soft-tissue]
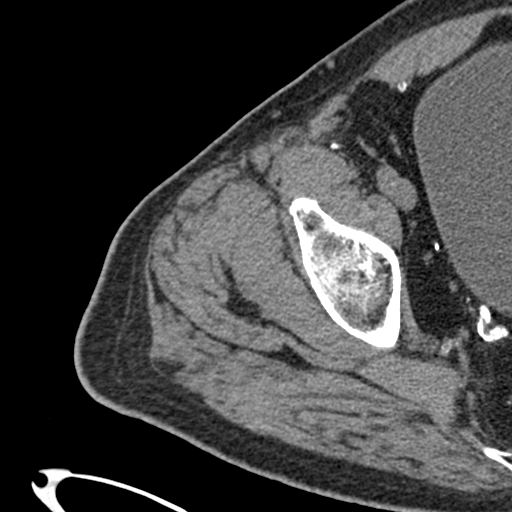
[im 84/105  lung]
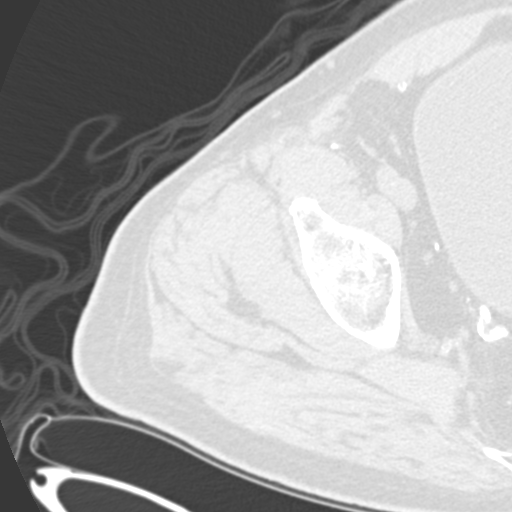
[im 84/105  bone]
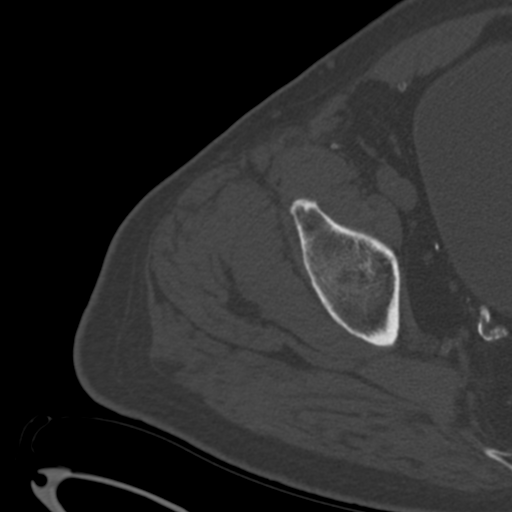
[im 91/105  lung]
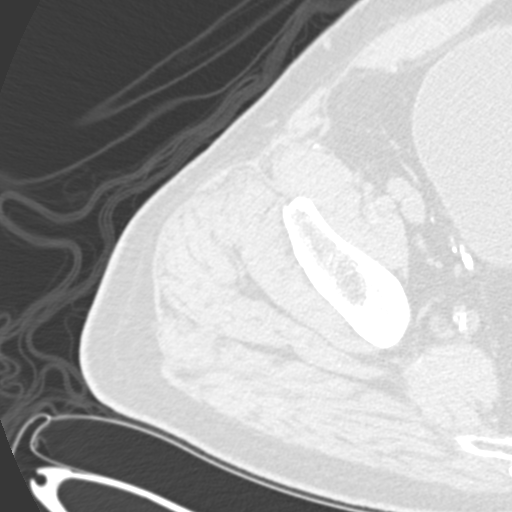
[im 98/105  soft-tissue]
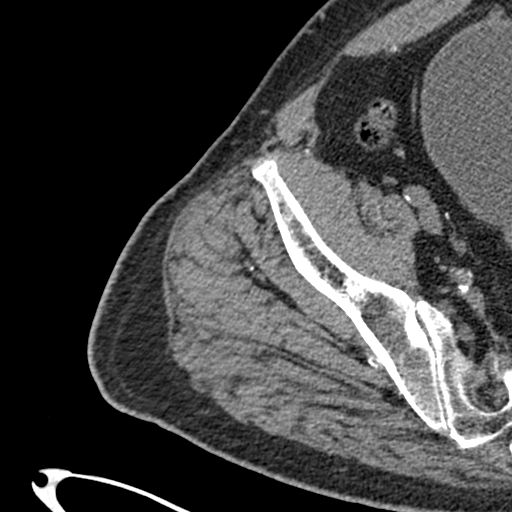
[im 98/105  lung]
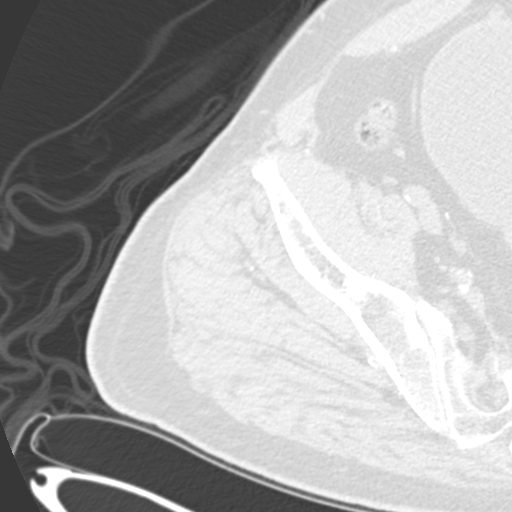

[Series 5: coronal st · coronal · 0.51mm/px · 3 of 125 slices shown]
[im 32/125  soft-tissue]
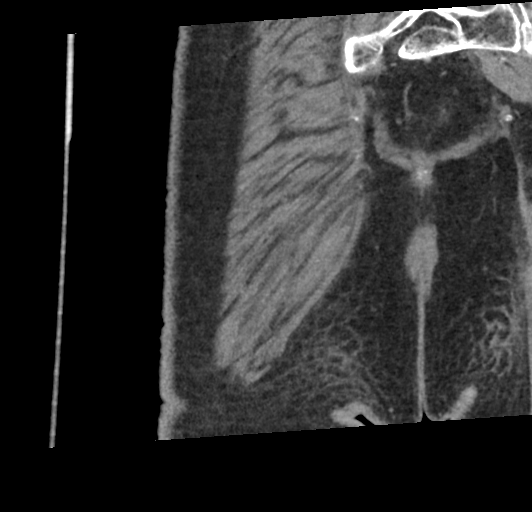
[im 63/125  soft-tissue]
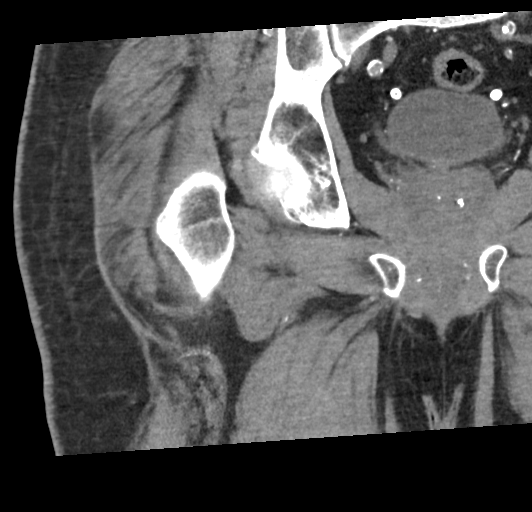
[im 94/125  soft-tissue]
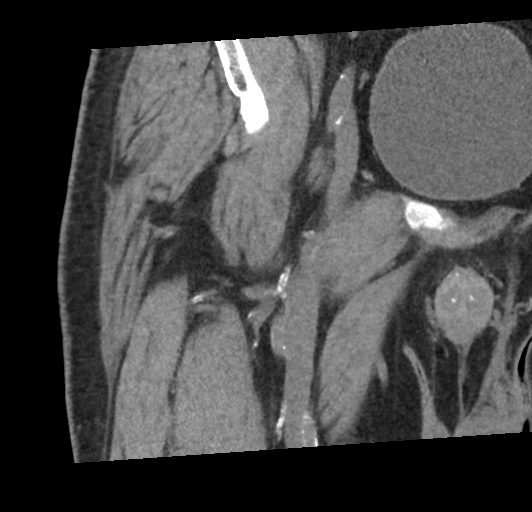

[14 of 46 positions shown; findings below may reference images not displayed]

FINDINGS: Bones/Joint/Cartilage

Somewhat vertical fracture through the right femoral neck involves
the transcervical region superiorly but extends to the basicervical
region inferiorly. The fracture appears to be intra-articular and no
true intertrochanteric component is identified. 28 degrees of apex
anterior angulation as measured on image 43 series 2. There is also
some mild varus angulation and some minimal comminution of the
fracture inferiorly.

No regional pelvic fracture. Small bone island in the right ischium.

Ligaments

Suboptimally assessed by CT.

Muscles and Tendons

Aside from mild indistinctness of fascia planes in the immediate
vicinity of the fracture, regional musculotendinous structures
appear unremarkable. Trace edema anterior to the right iloipsoas
muscle for example on image 33 series 3.

Soft tissues

Arterial atherosclerosis. Calcification of the vas deferens as can
be encountered in the setting of diabetes. Mild chondrocalcinosis in
the pubic symphysis.
IMPRESSION: 1. Right femoral neck fracture involving the transcervical region
superiorly and extending to the basicervical region inferiorly. The
fracture appears to be intra-articular without substantial
trochanteric component. 20 degrees of apex anterior angulation along
with some mild varus angulation. Minimal comminution inferiorly.
2. Atherosclerosis.

## 2021-11-02 IMAGING — RF DG HIP (WITH OR WITHOUT PELVIS) 2-3V*R*
1 series · 4 of 4 positions shown · non-contrast
Comparison: CT right hip, [DATE], [DATE] a.m.

FLUOROSCOPY TIME:  [DATE]

CLINICAL DATA: Intramedullary nail fixation, femoral neck fracture

EXAM:
DG HIP (WITH OR WITHOUT PELVIS) 2-3V RIGHT; DG C-ARM 1-60 MIN-NO
REPORT

[Series 1: run · 4 of 4 slices shown]
[im 1/4]
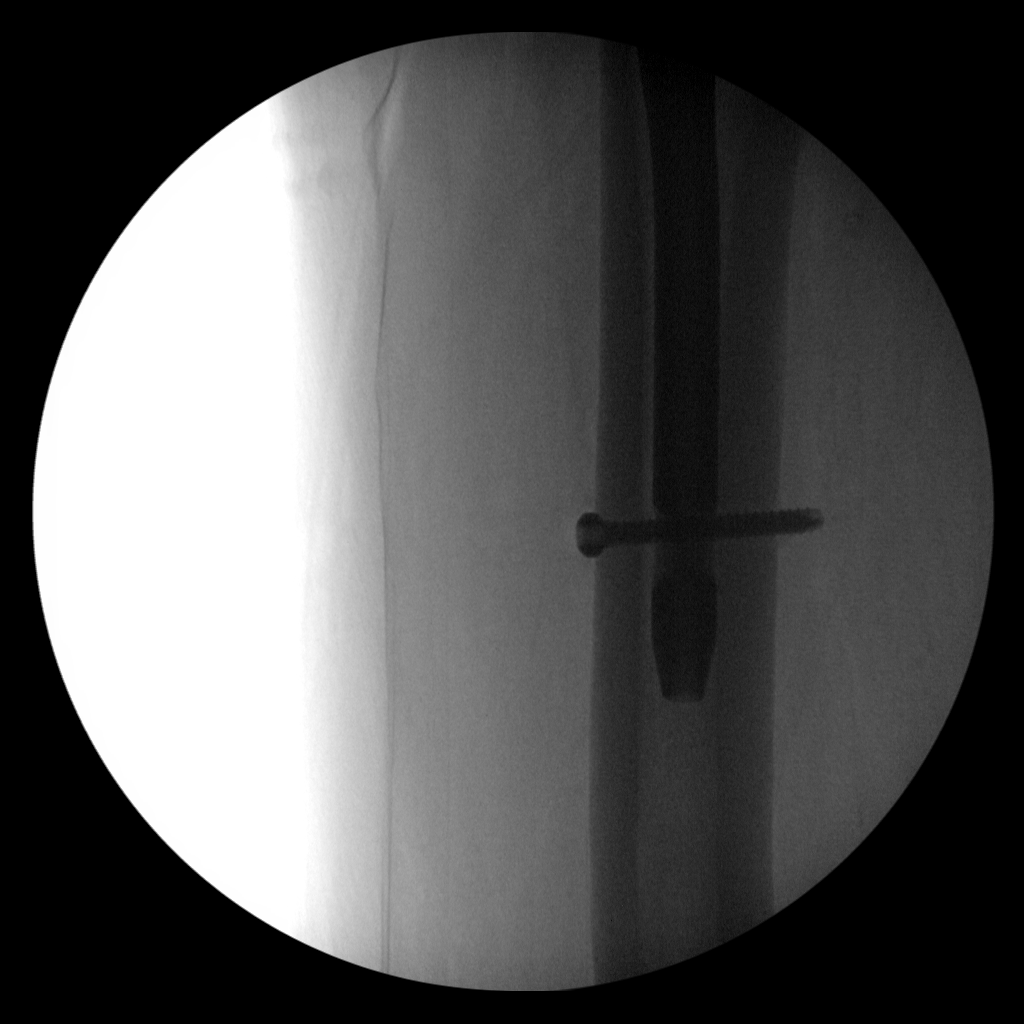
[im 2/4]
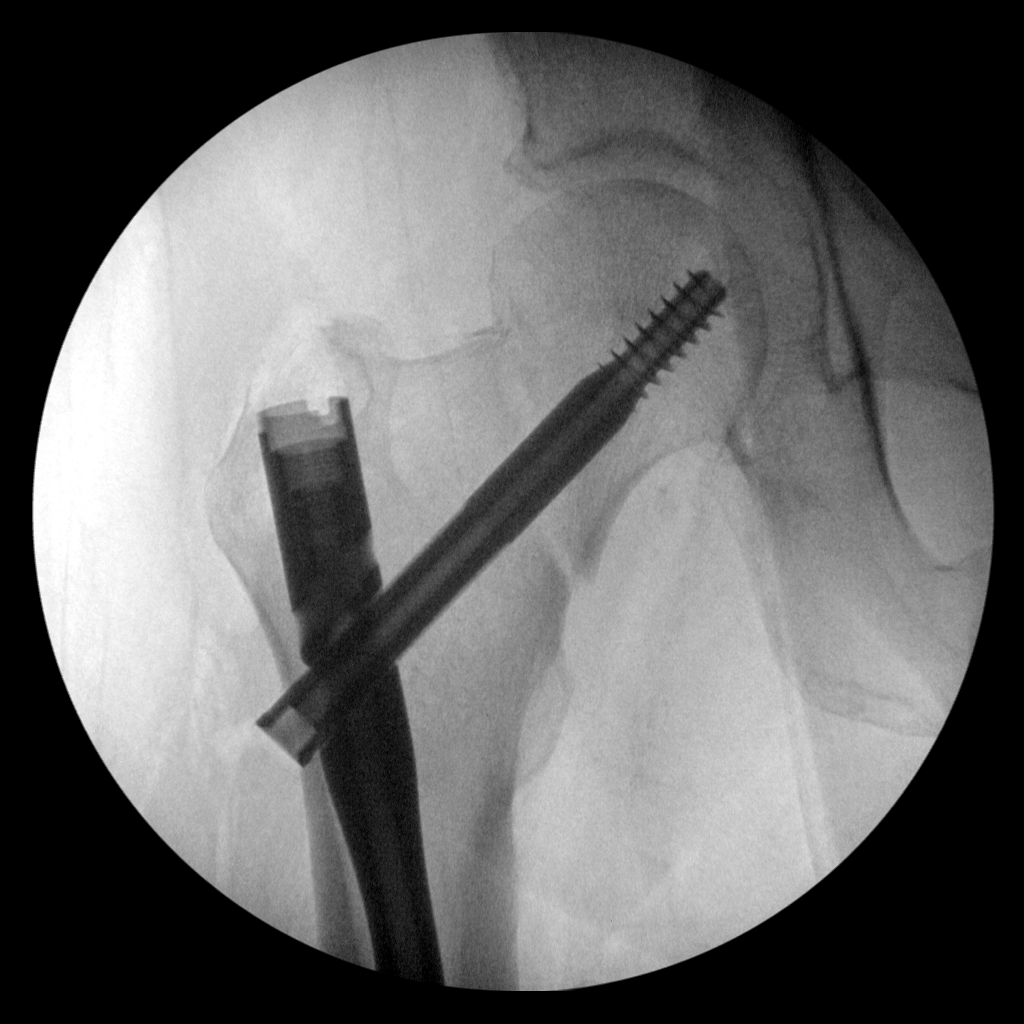
[im 3/4]
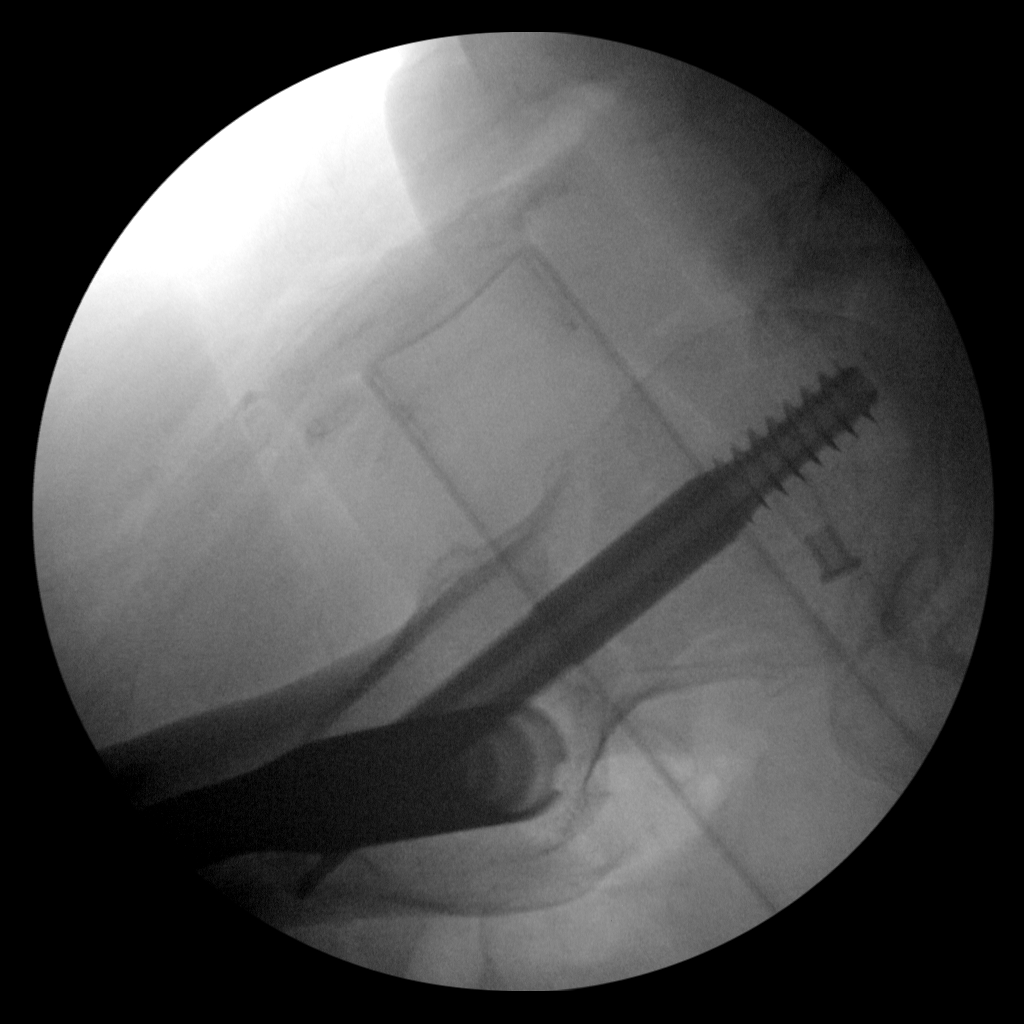
[im 4/4]
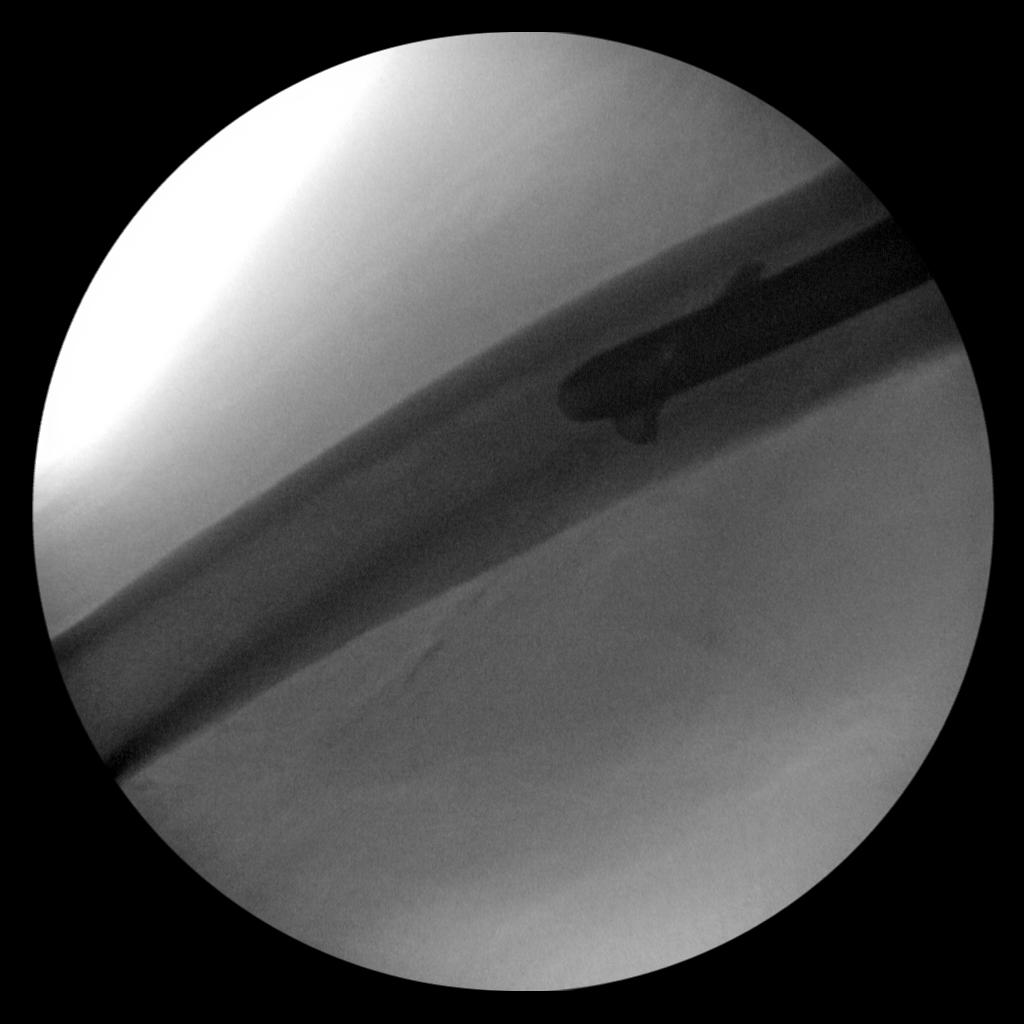

[4 of 4 positions shown; findings below may reference images not displayed]

FINDINGS: Intraoperative fluoroscopic images of the right hip demonstrate
intramedullary nail fixation of transcervical fracture of the right
femoral neck.

Postoperative radiographs demonstrate near anatomic alignment of
fracture with expected overlying change and no perihardware
fracture.
IMPRESSION: 1. Intraoperative fluoroscopic images of the right hip demonstrate
intramedullary nail fixation of transcervical fracture of the right
femoral neck.

2. Postoperative radiographs demonstrate near anatomic alignment of
fracture with expected overlying change and no perihardware
fracture.

## 2021-11-02 IMAGING — CR DG HIP (WITH OR WITHOUT PELVIS) 2-3V*R*
3 series · 4 of 4 positions shown · non-contrast
Comparison: None.

CLINICAL DATA: 60-year-old male status post fall landing on right
hip. Unable to weightbear.

EXAM:
DG HIP (WITH OR WITHOUT PELVIS) 2-3V RIGHT

[pelvis ap]
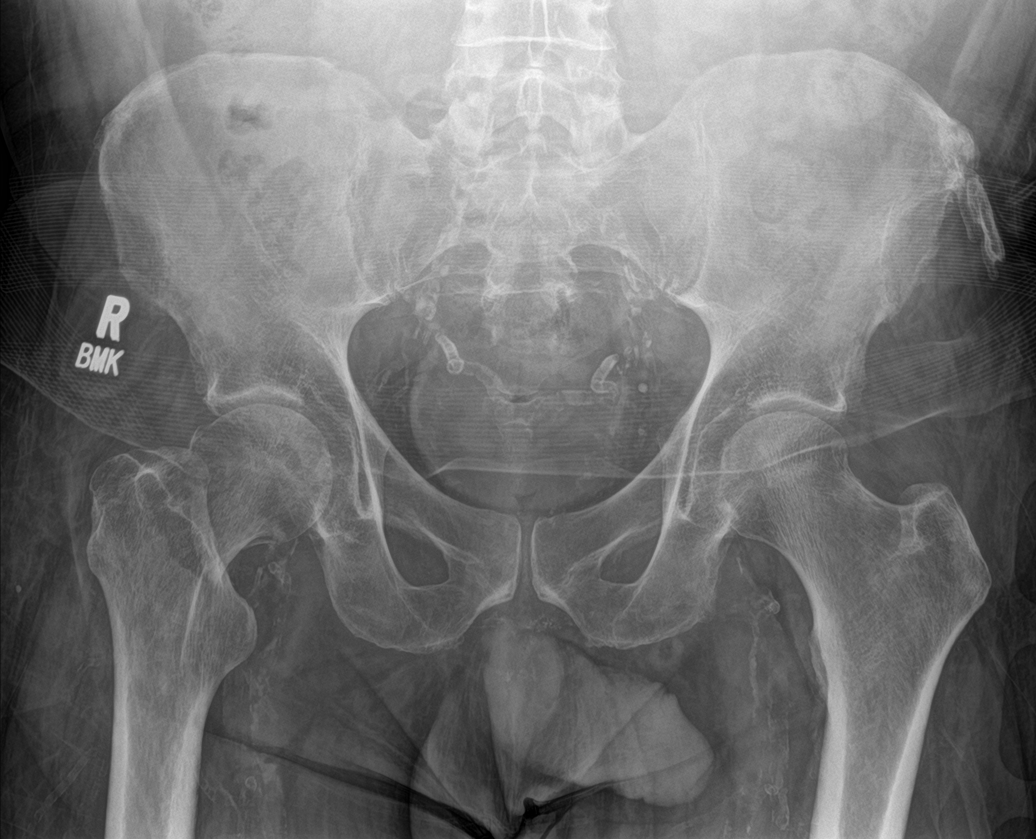

[hip ap]
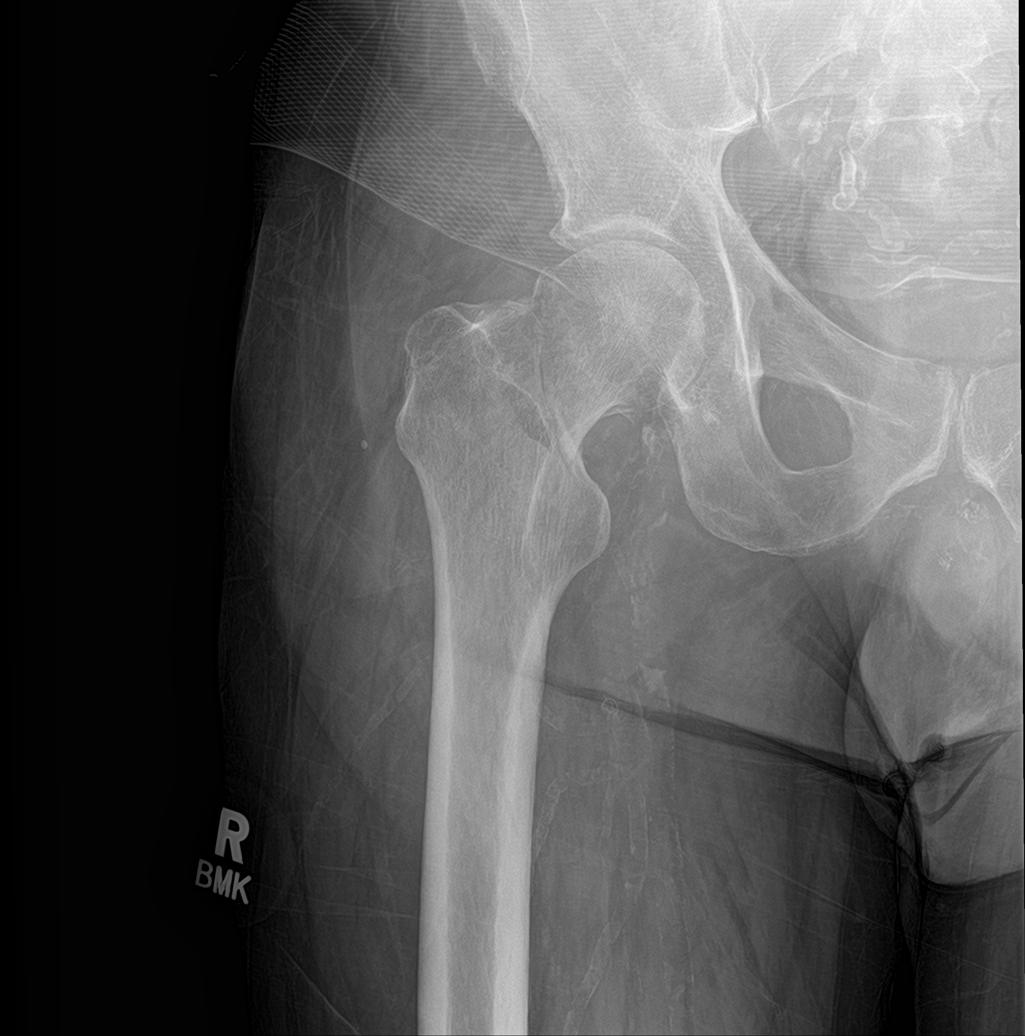

[Series 3: hip lat · 0.14mm/px · 2 of 2 slices shown]
[im 1/2]
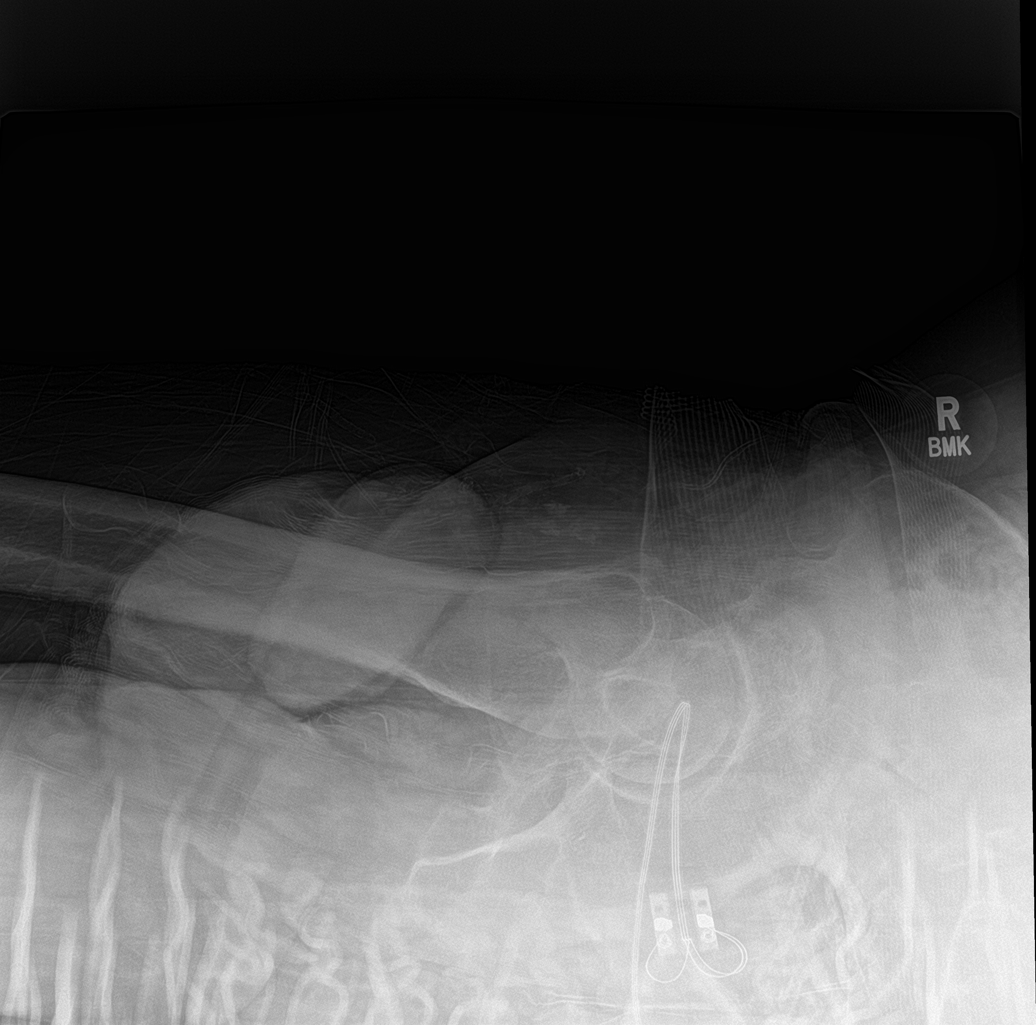
[im 2/2]
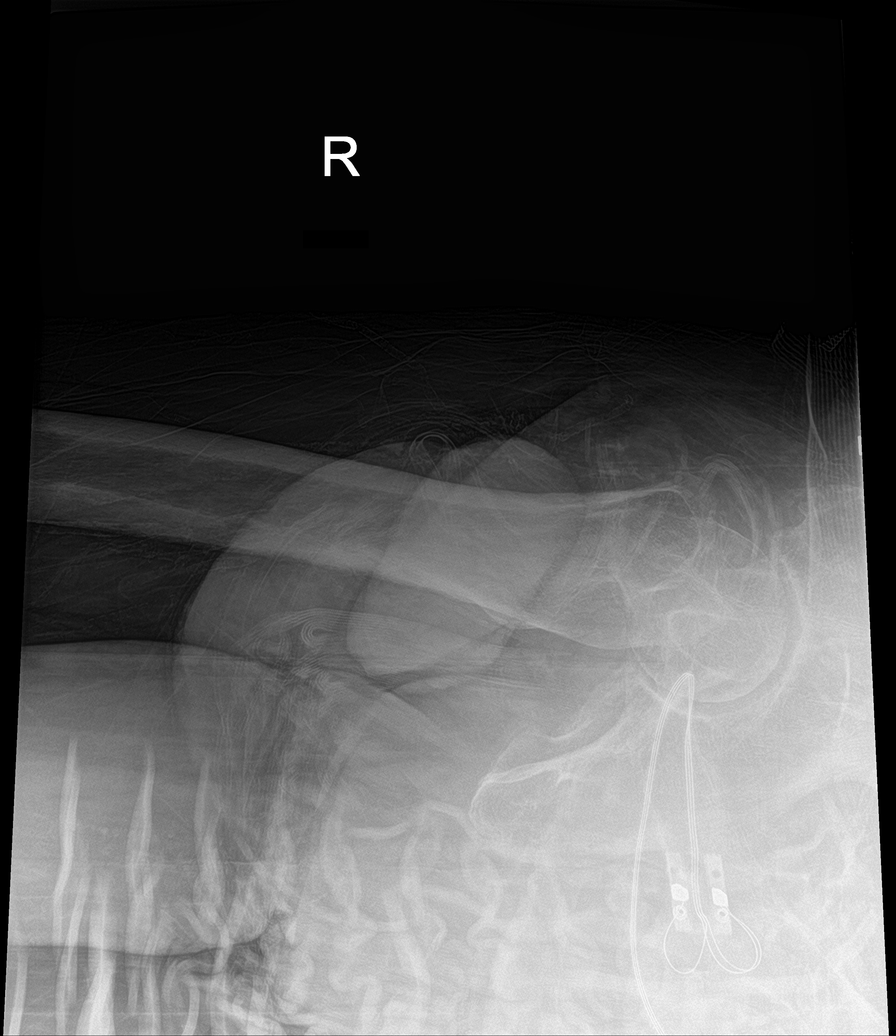

[4 of 4 positions shown; findings below may reference images not displayed]

FINDINGS: Femoral heads are normally located. Mildly impacted fracture of the
distal right femoral neck. But possible nondisplaced comminution of
the intertrochanteric segment also. Proximal right femoral shaft
intact.

No superimposed pelvis fracture identified. Grossly intact proximal
left femur. Extensive vascular calcifications in and around the
pelvis. SI joints appear symmetric. Paucity of bowel gas in the
lower abdomen.
IMPRESSION: Positive for acute mildly impacted fracture of the proximal right
femur. Fractured distal femoral neck, but suspect intertrochanteric
involvement also.

## 2021-11-02 IMAGING — DX DG HIP (WITH OR WITHOUT PELVIS) 2-3V*R*
3 series · 4 of 4 positions shown · non-contrast
Comparison: CT right hip, [DATE], [DATE] a.m.

FLUOROSCOPY TIME:  [DATE]

CLINICAL DATA: Intramedullary nail fixation, femoral neck fracture

EXAM:
DG HIP (WITH OR WITHOUT PELVIS) 2-3V RIGHT; DG C-ARM 1-60 MIN-NO
REPORT

[pelvis ap]
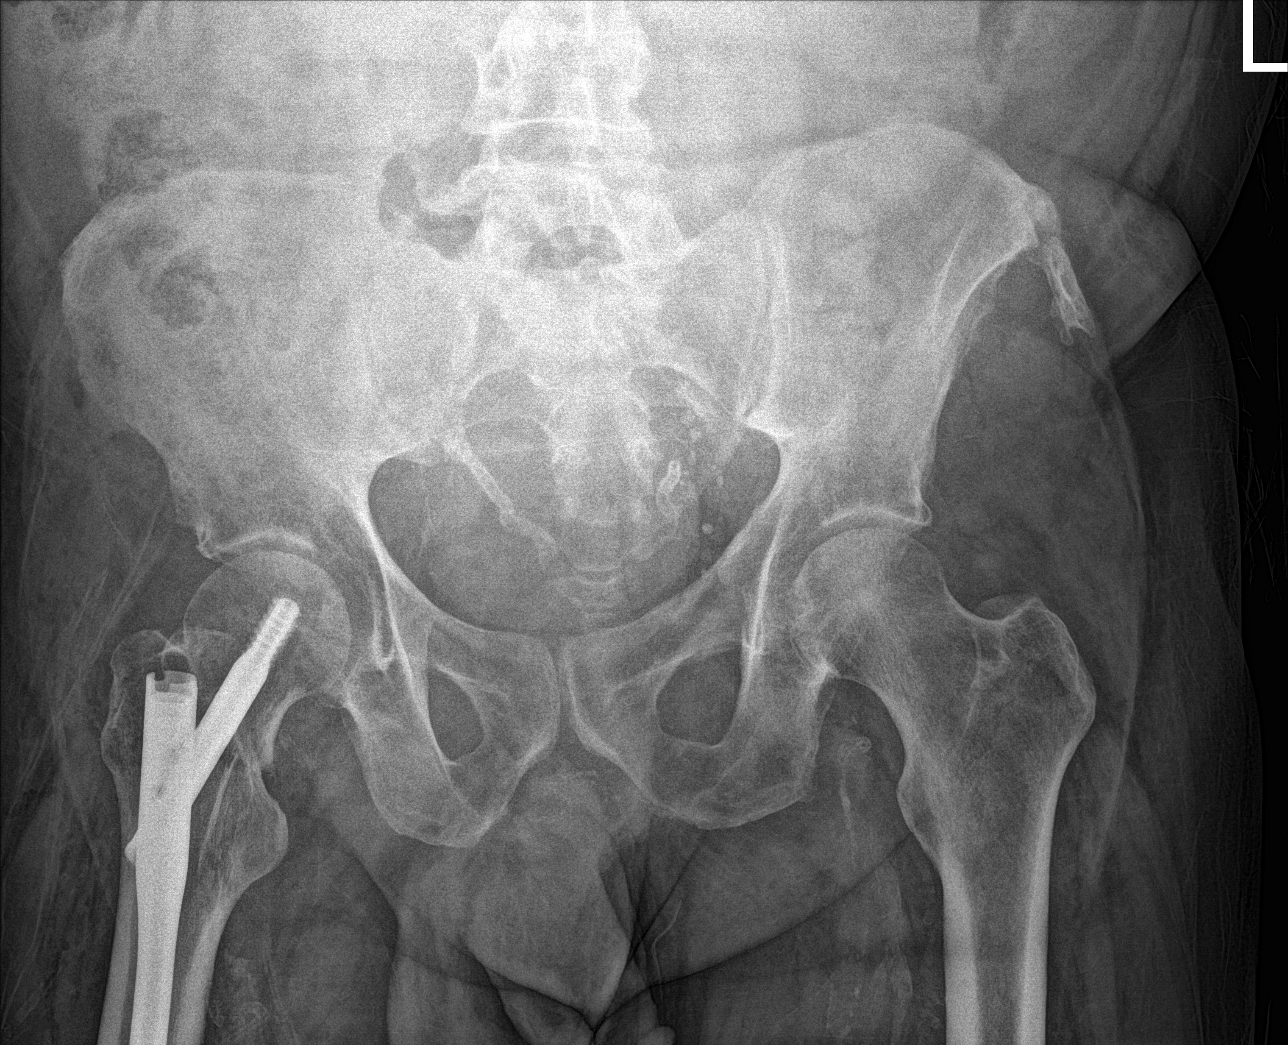

[hip ap]
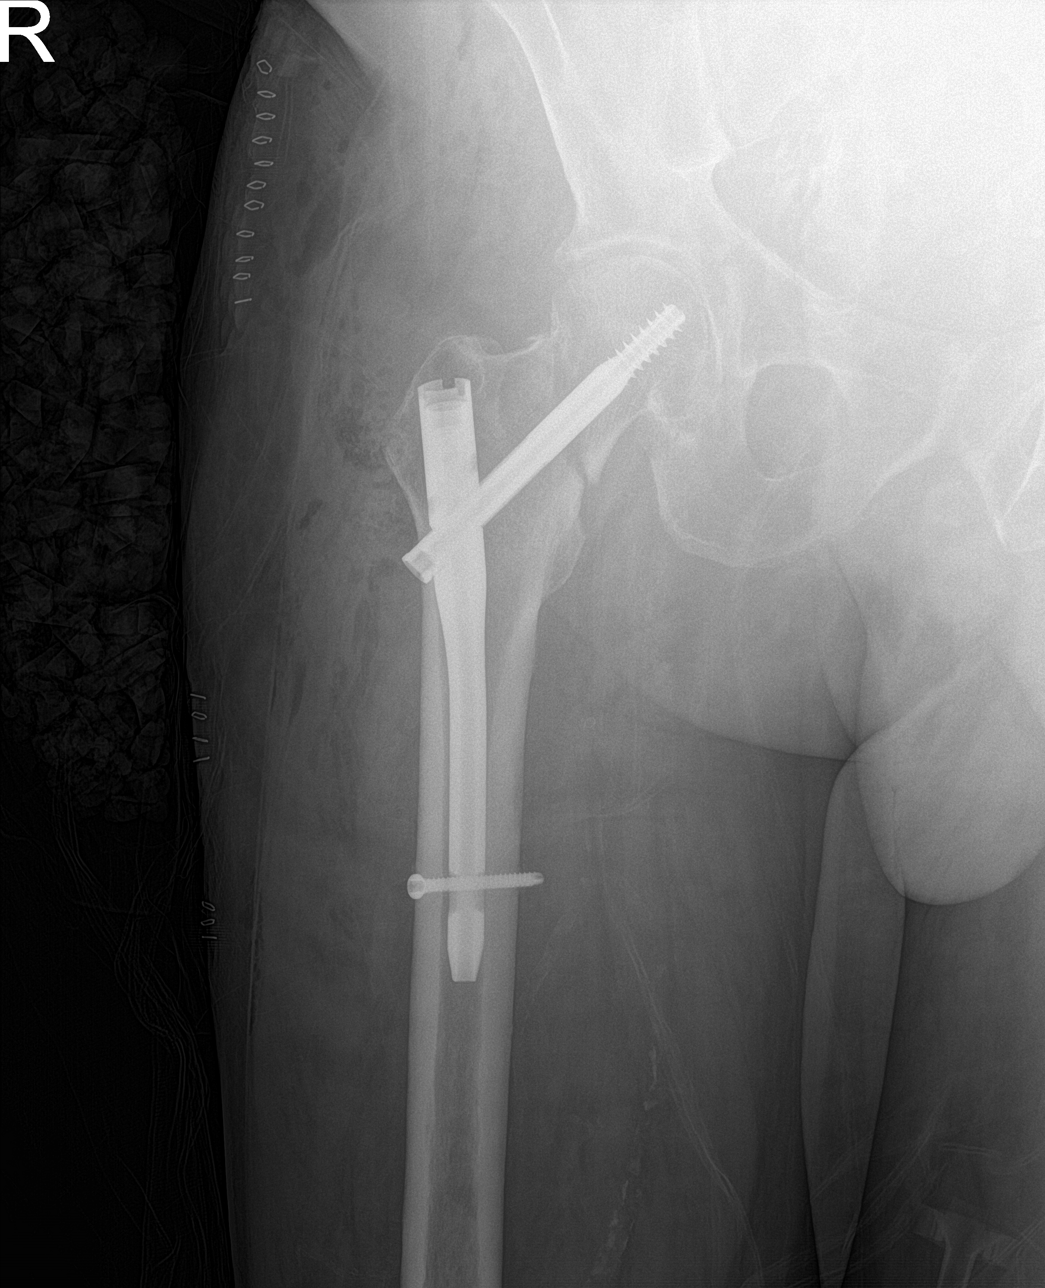

[Series 3: hip lat · 0.14mm/px · 2 of 2 slices shown]
[im 1/2]
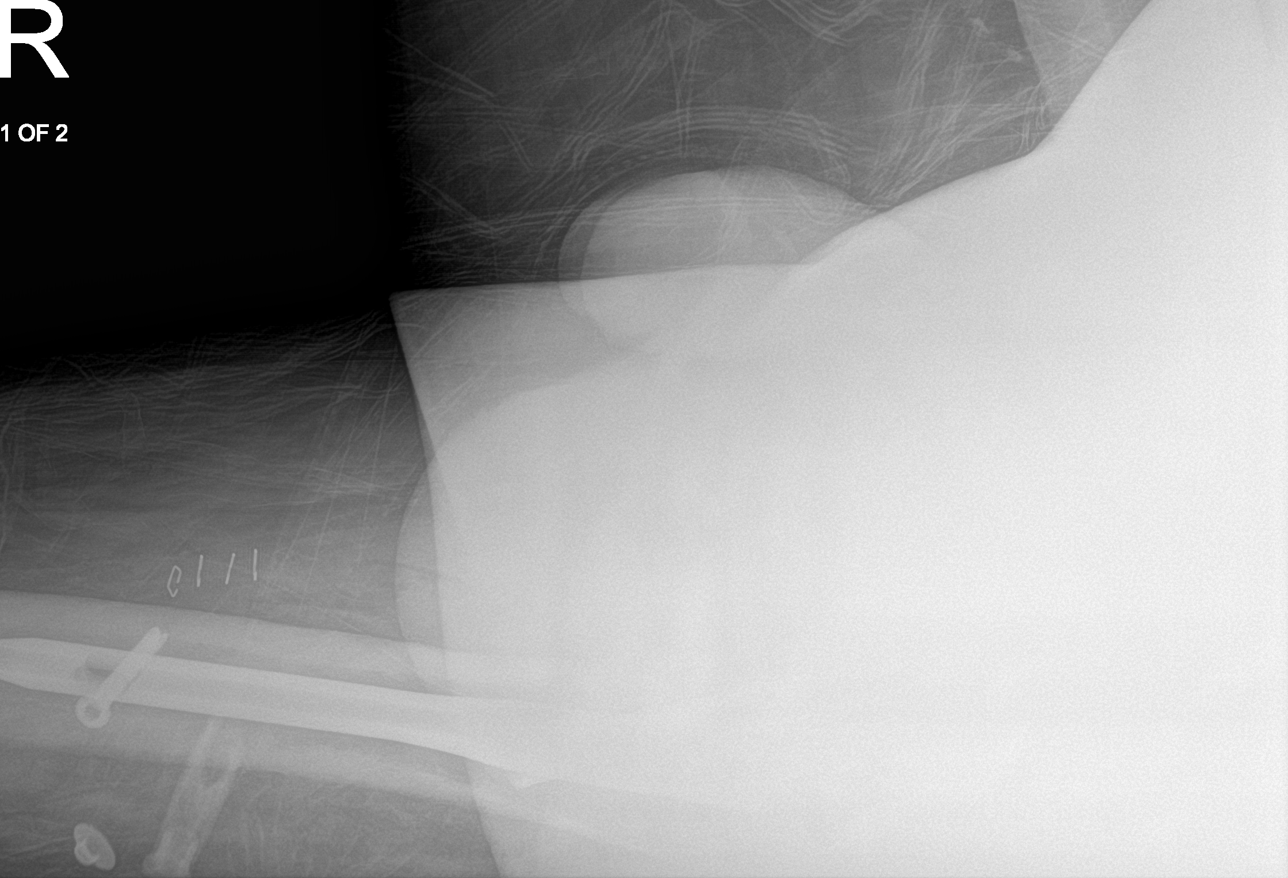
[im 2/2]
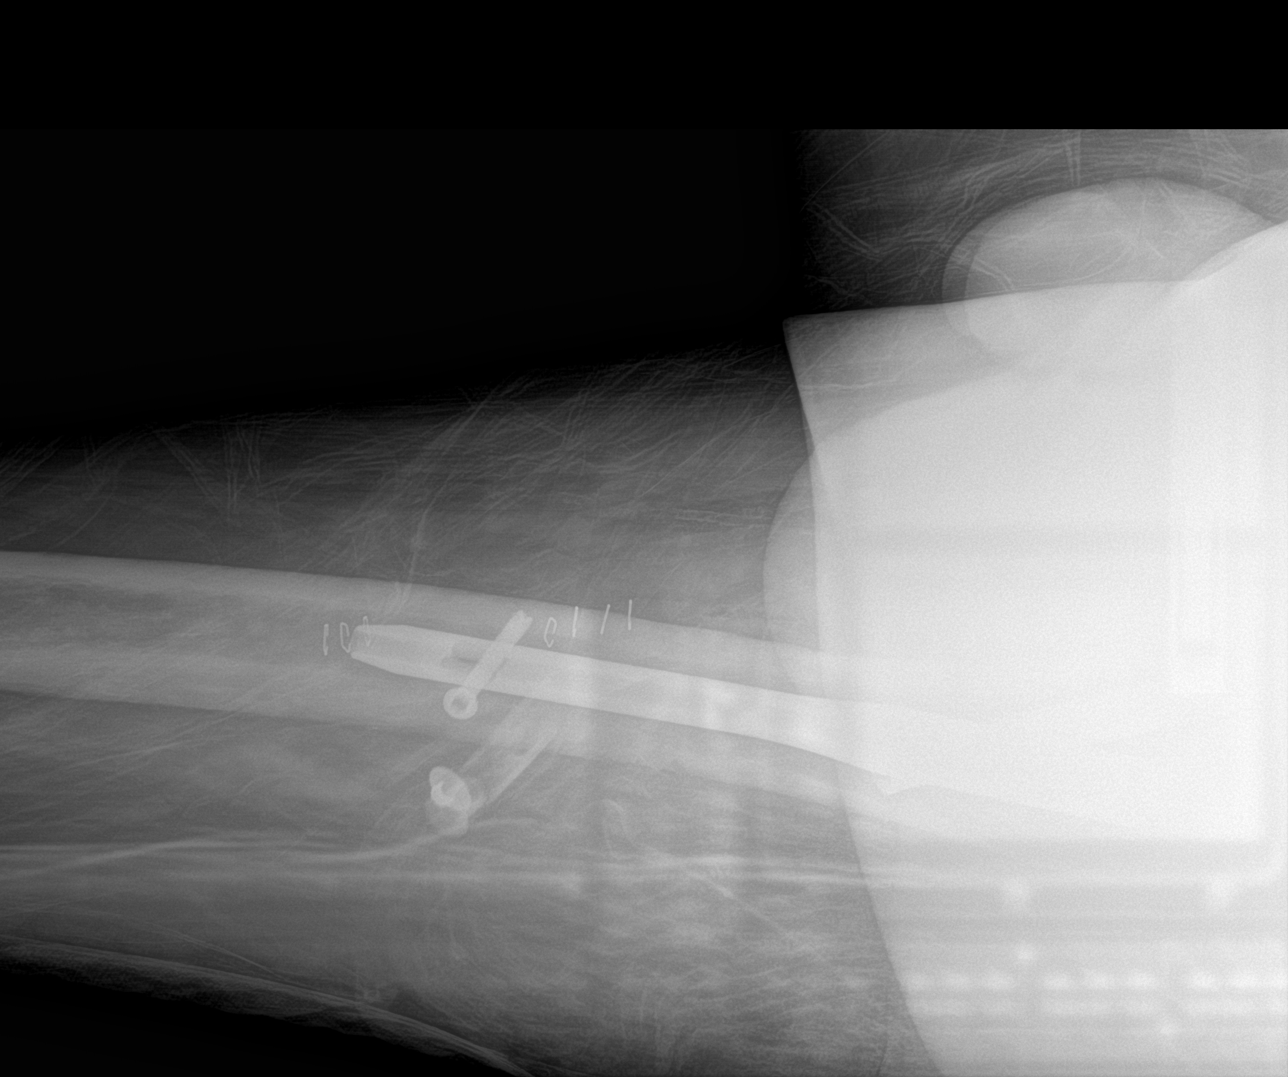

[4 of 4 positions shown; findings below may reference images not displayed]

FINDINGS: Intraoperative fluoroscopic images of the right hip demonstrate
intramedullary nail fixation of transcervical fracture of the right
femoral neck.

Postoperative radiographs demonstrate near anatomic alignment of
fracture with expected overlying change and no perihardware
fracture.
IMPRESSION: 1. Intraoperative fluoroscopic images of the right hip demonstrate
intramedullary nail fixation of transcervical fracture of the right
femoral neck.

2. Postoperative radiographs demonstrate near anatomic alignment of
fracture with expected overlying change and no perihardware
fracture.

## 2021-11-02 SURGERY — FIXATION, FRACTURE, INTERTROCHANTERIC, WITH INTRAMEDULLARY ROD
Anesthesia: General | Site: Hip | Laterality: Right

## 2021-11-02 MED ORDER — PROPOFOL 500 MG/50ML IV EMUL
INTRAVENOUS | Status: AC
  Filled 2021-11-02: qty 50

## 2021-11-02 MED ORDER — PROPOFOL 500 MG/50ML IV EMUL
INTRAVENOUS | Status: DC | PRN
  Administered 2021-11-02: 75 ug/kg/min via INTRAVENOUS

## 2021-11-02 MED ORDER — AMLODIPINE BESYLATE 5 MG PO TABS
2.5000 mg | ORAL_TABLET | Freq: Every day | ORAL | Status: DC
  Administered 2021-11-02 – 2021-11-06 (×5): 2.5 mg via ORAL
  Filled 2021-11-02 (×5): qty 1

## 2021-11-02 MED ORDER — METHOCARBAMOL 500 MG PO TABS
500.0000 mg | ORAL_TABLET | Freq: Four times a day (QID) | ORAL | Status: DC | PRN
  Administered 2021-11-03: 01:00:00 500 mg via ORAL
  Filled 2021-11-02: qty 1

## 2021-11-02 MED ORDER — BUPROPION HCL ER (XL) 150 MG PO TB24
150.0000 mg | ORAL_TABLET | Freq: Every day | ORAL | Status: DC
  Administered 2021-11-02 – 2021-11-06 (×5): 150 mg via ORAL
  Filled 2021-11-02 (×5): qty 1

## 2021-11-02 MED ORDER — ONDANSETRON HCL 4 MG/2ML IJ SOLN: 4.0000 mg | Freq: Four times a day (QID) | INTRAMUSCULAR | Status: DC | PRN

## 2021-11-02 MED ORDER — CEFAZOLIN SODIUM-DEXTROSE 2-4 GM/100ML-% IV SOLN: 2.0000 g | INTRAVENOUS | Status: AC

## 2021-11-02 MED ORDER — INSULIN ASPART 100 UNIT/ML IJ SOLN
10.0000 [IU] | Freq: Once | INTRAMUSCULAR | Status: AC
  Administered 2021-11-02: 10 [IU] via INTRAVENOUS
  Filled 2021-11-02: qty 1

## 2021-11-02 MED ORDER — MIDAZOLAM HCL 5 MG/5ML IJ SOLN
INTRAMUSCULAR | Status: DC | PRN
Start: 2021-11-02 — End: 2021-11-02
  Administered 2021-11-02: 2 mg via INTRAVENOUS

## 2021-11-02 MED ORDER — VITAMIN D3 25 MCG (1000 UNIT) PO TABS
2000.0000 [IU] | ORAL_TABLET | Freq: Every day | ORAL | Status: DC
  Administered 2021-11-02 – 2021-11-06 (×5): 2000 [IU] via ORAL
  Filled 2021-11-02 (×11): qty 2

## 2021-11-02 MED ORDER — MENTHOL 3 MG MT LOZG
1.0000 | LOZENGE | OROMUCOSAL | Status: DC | PRN
  Filled 2021-11-02: qty 9

## 2021-11-02 MED ORDER — BISACODYL 10 MG RE SUPP: 10.0000 mg | Freq: Every day | RECTAL | Status: DC | PRN

## 2021-11-02 MED ORDER — OXYCODONE-ACETAMINOPHEN 5-325 MG PO TABS: 1.0000 | ORAL_TABLET | ORAL | Status: DC | PRN

## 2021-11-02 MED ORDER — METOPROLOL SUCCINATE ER 25 MG PO TB24: 25.0000 mg | ORAL_TABLET | Freq: Every day | ORAL | Status: DC

## 2021-11-02 MED ORDER — DOCUSATE SODIUM 100 MG PO CAPS
100.0000 mg | ORAL_CAPSULE | Freq: Two times a day (BID) | ORAL | Status: DC
  Administered 2021-11-02 – 2021-11-06 (×9): 100 mg via ORAL
  Filled 2021-11-02 (×9): qty 1

## 2021-11-02 MED ORDER — ONDANSETRON HCL 4 MG/2ML IJ SOLN
INTRAMUSCULAR | Status: AC
  Filled 2021-11-02: qty 2

## 2021-11-02 MED ORDER — LIDOCAINE HCL (PF) 2 % IJ SOLN
INTRAMUSCULAR | Status: AC
  Filled 2021-11-02: qty 5

## 2021-11-02 MED ORDER — FENTANYL CITRATE (PF) 100 MCG/2ML IJ SOLN
25.0000 ug | INTRAMUSCULAR | Status: DC | PRN
  Administered 2021-11-02: 50 ug via INTRAVENOUS

## 2021-11-02 MED ORDER — TRAMADOL HCL 50 MG PO TABS
50.0000 mg | ORAL_TABLET | Freq: Two times a day (BID) | ORAL | Status: DC
  Administered 2021-11-02 – 2021-11-06 (×9): 50 mg via ORAL
  Filled 2021-11-02 (×9): qty 1

## 2021-11-02 MED ORDER — FENTANYL CITRATE (PF) 100 MCG/2ML IJ SOLN
INTRAMUSCULAR | Status: AC
  Filled 2021-11-02: qty 2

## 2021-11-02 MED ORDER — ONDANSETRON HCL 4 MG PO TABS: 4.0000 mg | ORAL_TABLET | Freq: Four times a day (QID) | ORAL | Status: DC | PRN

## 2021-11-02 MED ORDER — BUPIVACAINE IN DEXTROSE 0.75-8.25 % IT SOLN
INTRATHECAL | Status: DC | PRN
  Administered 2021-11-02: 2.5 mL via INTRATHECAL

## 2021-11-02 MED ORDER — DEXAMETHASONE SODIUM PHOSPHATE 10 MG/ML IJ SOLN
INTRAMUSCULAR | Status: DC | PRN
  Administered 2021-11-02: 4 mg via INTRAVENOUS

## 2021-11-02 MED ORDER — MORPHINE SULFATE (PF) 2 MG/ML IV SOLN
2.0000 mg | INTRAVENOUS | Status: DC | PRN
  Administered 2021-11-02: 2 mg via INTRAVENOUS
  Filled 2021-11-02: qty 1

## 2021-11-02 MED ORDER — CEFAZOLIN SODIUM-DEXTROSE 2-4 GM/100ML-% IV SOLN
INTRAVENOUS | Status: AC
  Filled 2021-11-02: qty 100

## 2021-11-02 MED ORDER — ONDANSETRON HCL 4 MG/2ML IJ SOLN: 4.0000 mg | Freq: Three times a day (TID) | INTRAMUSCULAR | Status: DC | PRN

## 2021-11-02 MED ORDER — LATANOPROST 0.005 % OP SOLN
1.0000 [drp] | Freq: Every day | OPHTHALMIC | Status: DC
  Administered 2021-11-02 – 2021-11-04 (×3): 1 [drp] via OPHTHALMIC
  Filled 2021-11-02 (×2): qty 2.5

## 2021-11-02 MED ORDER — KETAMINE HCL 50 MG/5ML IJ SOSY
PREFILLED_SYRINGE | INTRAMUSCULAR | Status: AC
  Filled 2021-11-02: qty 5

## 2021-11-02 MED ORDER — 0.9 % SODIUM CHLORIDE (POUR BTL) OPTIME
TOPICAL | Status: DC | PRN
  Administered 2021-11-02: 1000 mL

## 2021-11-02 MED ORDER — POLYETHYLENE GLYCOL 3350 17 G PO PACK: 17.0000 g | PACK | Freq: Every day | ORAL | Status: DC | PRN

## 2021-11-02 MED ORDER — ALUM & MAG HYDROXIDE-SIMETH 200-200-20 MG/5ML PO SUSP: 30.0000 mL | ORAL | Status: DC | PRN

## 2021-11-02 MED ORDER — PROPOFOL 10 MG/ML IV BOLUS
INTRAVENOUS | Status: DC | PRN
  Administered 2021-11-02: 150 mg via INTRAVENOUS

## 2021-11-02 MED ORDER — DEXAMETHASONE SODIUM PHOSPHATE 10 MG/ML IJ SOLN
INTRAMUSCULAR | Status: AC
  Filled 2021-11-02: qty 1

## 2021-11-02 MED ORDER — ACETAMINOPHEN 325 MG PO TABS: 650.0000 mg | ORAL_TABLET | Freq: Four times a day (QID) | ORAL | Status: DC | PRN

## 2021-11-02 MED ORDER — ACETAMINOPHEN 500 MG PO TABS
1000.0000 mg | ORAL_TABLET | Freq: Four times a day (QID) | ORAL | Status: AC
  Administered 2021-11-02 – 2021-11-03 (×4): 1000 mg via ORAL
  Filled 2021-11-02 (×4): qty 2

## 2021-11-02 MED ORDER — SENNOSIDES-DOCUSATE SODIUM 8.6-50 MG PO TABS: 1.0000 | ORAL_TABLET | Freq: Every evening | ORAL | Status: DC | PRN

## 2021-11-02 MED ORDER — PHENOL 1.4 % MT LIQD
1.0000 | OROMUCOSAL | Status: DC | PRN
  Filled 2021-11-02: qty 177

## 2021-11-02 MED ORDER — ALBUTEROL SULFATE (2.5 MG/3ML) 0.083% IN NEBU: 3.0000 mL | INHALATION_SOLUTION | RESPIRATORY_TRACT | Status: DC | PRN

## 2021-11-02 MED ORDER — LORATADINE 10 MG PO TABS
10.0000 mg | ORAL_TABLET | Freq: Every day | ORAL | Status: DC
  Administered 2021-11-02 – 2021-11-06 (×5): 10 mg via ORAL
  Filled 2021-11-02 (×5): qty 1

## 2021-11-02 MED ORDER — PANTOPRAZOLE SODIUM 40 MG PO TBEC
40.0000 mg | DELAYED_RELEASE_TABLET | Freq: Every day | ORAL | Status: DC
  Administered 2021-11-02 – 2021-11-06 (×5): 40 mg via ORAL
  Filled 2021-11-02 (×5): qty 1

## 2021-11-02 MED ORDER — ONDANSETRON HCL 4 MG/2ML IJ SOLN
INTRAMUSCULAR | Status: DC | PRN
  Administered 2021-11-02: 4 mg via INTRAVENOUS

## 2021-11-02 MED ORDER — MORPHINE SULFATE (PF) 4 MG/ML IV SOLN
4.0000 mg | Freq: Once | INTRAVENOUS | Status: AC
  Administered 2021-11-02: 4 mg via INTRAVENOUS
  Filled 2021-11-02: qty 1

## 2021-11-02 MED ORDER — OXYCODONE HCL 5 MG PO TABS: 10.0000 mg | ORAL_TABLET | ORAL | Status: DC | PRN

## 2021-11-02 MED ORDER — INSULIN ASPART 100 UNIT/ML IJ SOLN
0.0000 [IU] | Freq: Three times a day (TID) | INTRAMUSCULAR | Status: DC
  Administered 2021-11-02: 5 [IU] via SUBCUTANEOUS
  Administered 2021-11-02 (×2): 2 [IU] via SUBCUTANEOUS
  Administered 2021-11-03: 08:00:00 7 [IU] via SUBCUTANEOUS
  Administered 2021-11-03: 17:00:00 3 [IU] via SUBCUTANEOUS
  Administered 2021-11-03: 13:00:00 5 [IU] via SUBCUTANEOUS
  Administered 2021-11-04: 3 [IU] via SUBCUTANEOUS
  Administered 2021-11-04: 1 [IU] via SUBCUTANEOUS
  Administered 2021-11-04 – 2021-11-05 (×2): 3 [IU] via SUBCUTANEOUS
  Administered 2021-11-05 (×2): 5 [IU] via SUBCUTANEOUS
  Administered 2021-11-06: 7 [IU] via SUBCUTANEOUS
  Administered 2021-11-06: 2 [IU] via SUBCUTANEOUS
  Filled 2021-11-02 (×15): qty 1

## 2021-11-02 MED ORDER — ROCURONIUM BROMIDE 10 MG/ML (PF) SYRINGE
PREFILLED_SYRINGE | INTRAVENOUS | Status: AC
  Filled 2021-11-02: qty 10

## 2021-11-02 MED ORDER — METHOCARBAMOL 1000 MG/10ML IJ SOLN
500.0000 mg | Freq: Four times a day (QID) | INTRAVENOUS | Status: DC | PRN
  Filled 2021-11-02: qty 5

## 2021-11-02 MED ORDER — LABETALOL HCL 5 MG/ML IV SOLN
INTRAVENOUS | Status: AC
  Filled 2021-11-02: qty 4

## 2021-11-02 MED ORDER — SENNA 8.6 MG PO TABS
1.0000 | ORAL_TABLET | Freq: Two times a day (BID) | ORAL | Status: DC
  Administered 2021-11-02 – 2021-11-06 (×9): 8.6 mg via ORAL
  Filled 2021-11-02 (×9): qty 1

## 2021-11-02 MED ORDER — KETAMINE HCL 50 MG/ML IJ SOLN
INTRAMUSCULAR | Status: DC | PRN
  Administered 2021-11-02 (×2): 20 mg via INTRAMUSCULAR

## 2021-11-02 MED ORDER — FENTANYL CITRATE (PF) 100 MCG/2ML IJ SOLN
INTRAMUSCULAR | Status: DC | PRN
  Administered 2021-11-02 (×2): 50 ug via INTRAVENOUS

## 2021-11-02 MED ORDER — ACETAMINOPHEN 325 MG PO TABS: 325.0000 mg | ORAL_TABLET | Freq: Four times a day (QID) | ORAL | Status: DC | PRN

## 2021-11-02 MED ORDER — DM-GUAIFENESIN ER 30-600 MG PO TB12
1.0000 | ORAL_TABLET | Freq: Two times a day (BID) | ORAL | Status: DC | PRN
  Administered 2021-11-05: 1 via ORAL
  Filled 2021-11-02 (×2): qty 1

## 2021-11-02 MED ORDER — ONDANSETRON HCL 4 MG/2ML IJ SOLN
4.0000 mg | Freq: Once | INTRAMUSCULAR | Status: AC
  Administered 2021-11-02: 4 mg via INTRAVENOUS
  Filled 2021-11-02: qty 2

## 2021-11-02 MED ORDER — INSULIN ASPART 100 UNIT/ML IJ SOLN
0.0000 [IU] | Freq: Every day | INTRAMUSCULAR | Status: DC
  Administered 2021-11-02: 5 [IU] via SUBCUTANEOUS
  Administered 2021-11-03: 22:00:00 2 [IU] via SUBCUTANEOUS
  Administered 2021-11-04: 4 [IU] via SUBCUTANEOUS
  Administered 2021-11-05: 2 [IU] via SUBCUTANEOUS
  Administered 2021-11-06: 3 [IU] via SUBCUTANEOUS
  Filled 2021-11-02 (×5): qty 1

## 2021-11-02 MED ORDER — HYDRALAZINE HCL 20 MG/ML IJ SOLN
INTRAMUSCULAR | Status: AC
  Filled 2021-11-02: qty 1

## 2021-11-02 MED ORDER — METOPROLOL SUCCINATE ER 25 MG PO TB24
25.0000 mg | ORAL_TABLET | Freq: Every day | ORAL | Status: DC
  Administered 2021-11-02 – 2021-11-06 (×5): 25 mg via ORAL
  Filled 2021-11-02 (×5): qty 1

## 2021-11-02 MED ORDER — PROPOFOL 10 MG/ML IV BOLUS
INTRAVENOUS | Status: AC
  Filled 2021-11-02: qty 20

## 2021-11-02 MED ORDER — ENOXAPARIN SODIUM 40 MG/0.4ML IJ SOSY: 40.0000 mg | PREFILLED_SYRINGE | INTRAMUSCULAR | Status: DC

## 2021-11-02 MED ORDER — HEPARIN SODIUM (PORCINE) 5000 UNIT/ML IJ SOLN
5000.0000 [IU] | Freq: Three times a day (TID) | INTRAMUSCULAR | Status: DC
  Administered 2021-11-04 – 2021-11-06 (×7): 5000 [IU] via SUBCUTANEOUS
  Filled 2021-11-02 (×12): qty 1

## 2021-11-02 MED ORDER — CEFAZOLIN SODIUM-DEXTROSE 2-4 GM/100ML-% IV SOLN: 2.0000 g | Freq: Four times a day (QID) | INTRAVENOUS | Status: DC

## 2021-11-02 MED ORDER — FLUOXETINE HCL 20 MG PO CAPS
20.0000 mg | ORAL_CAPSULE | Freq: Every day | ORAL | Status: DC
  Administered 2021-11-02 – 2021-11-06 (×5): 20 mg via ORAL
  Filled 2021-11-02 (×6): qty 1

## 2021-11-02 MED ORDER — ONDANSETRON HCL 4 MG/2ML IJ SOLN: 4.0000 mg | Freq: Once | INTRAMUSCULAR | Status: DC | PRN

## 2021-11-02 MED ORDER — METHOCARBAMOL 500 MG PO TABS
500.0000 mg | ORAL_TABLET | Freq: Three times a day (TID) | ORAL | Status: DC | PRN
  Filled 2021-11-02: qty 1

## 2021-11-02 MED ORDER — HYDROMORPHONE HCL 1 MG/ML IJ SOLN: 0.5000 mg | INTRAMUSCULAR | Status: DC | PRN

## 2021-11-02 MED ORDER — INSULIN GLARGINE-YFGN 100 UNIT/ML ~~LOC~~ SOLN
14.0000 [IU] | Freq: Two times a day (BID) | SUBCUTANEOUS | Status: DC
  Administered 2021-11-02 – 2021-11-06 (×10): 14 [IU] via SUBCUTANEOUS
  Filled 2021-11-02 (×11): qty 0.14

## 2021-11-02 MED ORDER — SODIUM CHLORIDE 0.9 % IV SOLN: INTRAVENOUS | Status: DC | PRN

## 2021-11-02 MED ORDER — ATORVASTATIN CALCIUM 20 MG PO TABS
80.0000 mg | ORAL_TABLET | Freq: Every day | ORAL | Status: DC
  Administered 2021-11-02 – 2021-11-06 (×5): 80 mg via ORAL
  Filled 2021-11-02 (×5): qty 4

## 2021-11-02 MED ORDER — ASPIRIN EC 81 MG PO TBEC
81.0000 mg | DELAYED_RELEASE_TABLET | Freq: Every day | ORAL | Status: DC
  Administered 2021-11-03 – 2021-11-06 (×4): 81 mg via ORAL
  Filled 2021-11-02 (×4): qty 1

## 2021-11-02 MED ORDER — AMLODIPINE BESYLATE 5 MG PO TABS: 2.5000 mg | ORAL_TABLET | Freq: Every day | ORAL | Status: DC

## 2021-11-02 MED ORDER — HYDRALAZINE HCL 20 MG/ML IJ SOLN
5.0000 mg | INTRAMUSCULAR | Status: DC | PRN
  Administered 2021-11-02: 5 mg via INTRAVENOUS
  Filled 2021-11-02: qty 1

## 2021-11-02 MED ORDER — MIDAZOLAM HCL 2 MG/2ML IJ SOLN
INTRAMUSCULAR | Status: AC
  Filled 2021-11-02: qty 2

## 2021-11-02 MED ORDER — CEFAZOLIN SODIUM-DEXTROSE 2-3 GM-%(50ML) IV SOLR
INTRAVENOUS | Status: DC | PRN
  Administered 2021-11-02: 2 g via INTRAVENOUS

## 2021-11-02 MED ORDER — OXYCODONE HCL 5 MG PO TABS
5.0000 mg | ORAL_TABLET | ORAL | Status: DC | PRN
  Administered 2021-11-03: 01:00:00 10 mg via ORAL
  Filled 2021-11-02: qty 2

## 2021-11-02 MED ORDER — SODIUM CHLORIDE 0.9 % IV SOLN: Freq: Once | INTRAVENOUS | Status: AC

## 2021-11-02 MED ORDER — SODIUM CHLORIDE 0.9 % IR SOLN
Status: DC | PRN
  Administered 2021-11-02: 2 mL

## 2021-11-02 SURGICAL SUPPLY — 42 items
BIT DRILL CANN LG 4.3MM (BIT) ×1 IMPLANT
BNDG COHESIVE 6X5 TAN ST LF (GAUZE/BANDAGES/DRESSINGS) ×4 IMPLANT
DRAPE 3/4 80X56 (DRAPES) ×4 IMPLANT
DRAPE SURG 17X11 SM STRL (DRAPES) ×4 IMPLANT
DRAPE U-SHAPE 47X51 STRL (DRAPES) ×2 IMPLANT
DRILL BIT CANN LG 4.3MM (BIT) ×2
DRSG OPSITE POSTOP 3X4 (GAUZE/BANDAGES/DRESSINGS) ×4 IMPLANT
DRSG OPSITE POSTOP 4X14 (GAUZE/BANDAGES/DRESSINGS) IMPLANT
DRSG OPSITE POSTOP 4X6 (GAUZE/BANDAGES/DRESSINGS) ×2 IMPLANT
DURAPREP 26ML APPLICATOR (WOUND CARE) ×4 IMPLANT
ELECT REM PT RETURN 9FT ADLT (ELECTROSURGICAL) ×2
ELECTRODE REM PT RTRN 9FT ADLT (ELECTROSURGICAL) ×1 IMPLANT
GAUZE 4X4 16PLY ~~LOC~~+RFID DBL (SPONGE) ×2 IMPLANT
GLOVE SURG ORTHO LTX SZ9 (GLOVE) ×4 IMPLANT
GLOVE SURG UNDER POLY LF SZ9 (GLOVE) ×2 IMPLANT
GOWN STRL REUS TWL 2XL XL LVL4 (GOWN DISPOSABLE) ×2 IMPLANT
GOWN STRL REUS W/ TWL LRG LVL3 (GOWN DISPOSABLE) ×1 IMPLANT
GOWN STRL REUS W/TWL LRG LVL3 (GOWN DISPOSABLE) ×1
GUIDEPIN VERSANAIL DSP 3.2X444 (ORTHOPEDIC DISPOSABLE SUPPLIES) ×2 IMPLANT
GUIDEWIRE BALL NOSE 100CM (WIRE) ×2 IMPLANT
HEMOVAC 400CC 10FR (MISCELLANEOUS) IMPLANT
KIT TURNOVER CYSTO (KITS) ×2 IMPLANT
MANIFOLD NEPTUNE II (INSTRUMENTS) ×2 IMPLANT
MAT ABSORB  FLUID 56X50 GRAY (MISCELLANEOUS) ×1
MAT ABSORB FLUID 56X50 GRAY (MISCELLANEOUS) ×1 IMPLANT
NAIL HIP FRACT 130D 11X180 (Screw) ×2 IMPLANT
NS IRRIG 1000ML POUR BTL (IV SOLUTION) ×2 IMPLANT
PACK HIP COMPR (MISCELLANEOUS) ×2 IMPLANT
PAD ARMBOARD 7.5X6 YLW CONV (MISCELLANEOUS) ×2 IMPLANT
SCREW BONE CORTICAL 5.0X42 (Screw) ×2 IMPLANT
SCREW LAG 10.5MMX105MM HFN (Screw) ×2 IMPLANT
SPONGE T-LAP 18X18 ~~LOC~~+RFID (SPONGE) ×4 IMPLANT
STAPLER SKIN PROX 35W (STAPLE) ×2 IMPLANT
SUCTION FRAZIER HANDLE 10FR (MISCELLANEOUS) ×1
SUCTION TUBE FRAZIER 10FR DISP (MISCELLANEOUS) ×1 IMPLANT
SUT VIC AB 0 CT1 36 (SUTURE) ×4 IMPLANT
SUT VIC AB 2-0 CT1 27 (SUTURE) ×1
SUT VIC AB 2-0 CT1 TAPERPNT 27 (SUTURE) ×1 IMPLANT
SUT VICRYL 0 AB UR-6 (SUTURE) ×2 IMPLANT
SYR 30ML LL (SYRINGE) ×2 IMPLANT
WATER STERILE IRR 1000ML POUR (IV SOLUTION) ×4 IMPLANT
WATER STERILE IRR 500ML POUR (IV SOLUTION) ×2 IMPLANT

## 2021-11-02 NOTE — Consult Note (Signed)
ORTHOPAEDIC CONSULTATION  REQUESTING PHYSICIAN: Ivor Costa, MD  Chief Complaint: Right hip pain status post fall  HPI: Bobby Sampson is a 60 y.o. male with a history of diabetic neuropathy who explains that he fell overnight walking around in the dark.  He cannot feel his feet which caused him to stumble and fall.  Patient has significant pain in the right hip.  X-rays and a CT scan were performed which have confirmed a basicervical/intertrochanteric right hip fracture.  Orthopedics is consulted for management of the patient's fracture.  Past Medical History:  Diagnosis Date   Diabetes mellitus without complication ([place])    Hypertension    Neuropathy    Renal disorder    Stroke ([place])    2016   Past Surgical History:  Procedure Laterality Date   TONSILLECTOMY     Social History   Socioeconomic History   Marital status: Married    Spouse name: Not on file   Number of children: Not on file   Years of education: Not on file   Highest education level: Not on file  Occupational History   Not on file  Tobacco Use   Smoking status: Never   Smokeless tobacco: Never  Substance and Sexual Activity   Alcohol use: Not Currently   Drug use: Not Currently   Sexual activity: Not on file  Other Topics Concern   Not on file  Social History Narrative   Not on file   Social Determinants of Health   Financial Resource Strain: Not on file  Food Insecurity: Not on file  Transportation Needs: Not on file  Physical Activity: Not on file  Stress: Not on file  Social Connections: Not on file   Family History  Problem Relation Age of Onset   Hypertension Mother    Diabetes Mother    Kidney disease Mother    Allergies  Allergen Reactions   Liraglutide Nausea And Vomiting    Other reaction(s): Nausea and vomiting, Fatigue   Trulicity [Dulaglutide] Nausea And Vomiting   Prior to Admission medications   Medication Sig Start Date End Date Taking? Authorizing Provider  amLODipine  (NORVASC) 2.5 MG tablet Take 2.5 mg by mouth daily. 10/21/21  Yes [provider]  atorvastatin (LIPITOR) 80 MG tablet Take 80 mg by mouth daily.   Yes [provider]  buPROPion (WELLBUTRIN XL) 150 MG 24 hr tablet Take 150 mg by mouth daily. 10/15/21  Yes [provider]  cholecalciferol (VITAMIN D) 25 MCG (1000 UT) tablet Take 2,000 Units by mouth daily.   Yes [provider]  FLUoxetine (PROZAC) 20 MG capsule Take 20 mg by mouth daily. 08/08/21  Yes [provider]  insulin glargine (LANTUS SOLOSTAR) 100 UNIT/ML Solostar Pen Inject 20 Units into the skin every 12 (twelve) hours. 02/16/21  Yes Wieting, Richard, MD  latanoprost (XALATAN) 0.005 % ophthalmic solution Place 1 drop into both eyes at bedtime. 12/21/20  Yes [provider]  loratadine (CLARITIN) 10 MG tablet Take 10 mg by mouth daily.   Yes [provider]  metoprolol succinate (TOPROL-XL) 25 MG 24 hr tablet Take 25 mg by mouth daily. 10/21/21  Yes [provider]  NOVOLOG FLEXPEN 100 UNIT/ML FlexPen Inject 10 Units into the skin 3 (three) times daily with meals. 01/02/21  Yes [provider]  pantoprazole (PROTONIX) 40 MG tablet Take 1 tablet (40 mg total) by mouth daily. 02/16/21 11/02/21 Yes Wieting, Richard, MD  aspirin EC 81 MG tablet Take 81 mg by  mouth daily.    [provider]  Calcium Acetate 667 MG TABS Take 667 mg by mouth 3 (three) times daily with meals. Patient not taking: Reported on 11/02/2021    [provider]  carvedilol (COREG) 6.25 MG tablet Take 6.25 mg by mouth 2 (two) times daily with a meal. Patient not taking: Reported on 11/02/2021    [provider]  escitalopram (LEXAPRO) 20 MG tablet Take 20 mg by mouth daily. Patient not taking: Reported on 11/02/2021 01/10/21   [provider]  losartan (COZAAR) 25 MG tablet Take 25 mg by mouth daily. Patient not taking: Reported on 11/02/2021 12/21/20   [provider]  midodrine (PROAMATINE) 2.5 MG tablet Take 2.5 mg by mouth as directed. 10/15/21   [provider]  Multiple Vitamin (MULTIVITAMIN WITH MINERALS) TABS tablet Take 1 tablet by mouth daily. Patient not taking: Reported on 11/02/2021    [provider]  ondansetron (ZOFRAN-ODT) 8 MG disintegrating tablet Take 8 mg by mouth every 8 (eight) hours as needed for nausea. 08/08/21   [provider]  torsemide (DEMADEX) 20 MG tablet Take 40 mg by mouth daily. Patient not taking: Reported on 11/02/2021 01/04/21   [provider]  traZODone (DESYREL) 50 MG tablet Take 50 mg by mouth at bedtime as needed for sleep. Patient not taking: Reported on 11/02/2021 12/04/20   [provider]   DG Chest 1 View  Result Date: 11/02/2021 CLINICAL DATA:  60 year old male status post fall with right hip fracture. EXAM: CHEST  1 VIEW COMPARISON:  Portable chest 11/07/2019. FINDINGS: Portable AP supine view at 0444 hours. Right chest dual lumen dialysis type catheter in place. Normal lung volumes and mediastinal contours. Visualized tracheal air column is within normal limits. Allowing for portable technique the lungs are clear. No acute osseous abnormality identified. Negative visible bowel gas. IMPRESSION: Right chest dialysis type catheter. Otherwise Negative portable chest. Electronically Signed   By: Genevie Ann M.D.   On: 11/02/2021 05:25   CT HIP RIGHT WO CONTRAST  Result Date: 11/02/2021 CLINICAL DATA:  Right hip fracture EXAM: CT OF THE RIGHT HIP WITHOUT CONTRAST 3D ACQUISITION WORKSTATION RECONSTRUCTION TECHNIQUE: Multidetector CT imaging of the right hip was performed according to the standard protocol. Multiplanar CT image reconstructions were also generated. Three-dimensional reconstructions of the right hip and associated fracture were performed on the acquisition workstation. COMPARISON:  Radiographs 11/02/2021 FINDINGS: Bones/Joint/Cartilage Somewhat vertical  fracture through the right femoral neck involves the transcervical region superiorly but extends to the basicervical region inferiorly. The fracture appears to be intra-articular and no true intertrochanteric component is identified. 28 degrees of apex anterior angulation as measured on image 43 series 2. There is also some mild varus angulation and some minimal comminution of the fracture inferiorly. No regional pelvic fracture. Small bone island in the right ischium. Ligaments Suboptimally assessed by CT. Muscles and Tendons Aside from mild indistinctness of fascia planes in the immediate vicinity of the fracture, regional musculotendinous structures appear unremarkable. Trace edema anterior to the right iloipsoas muscle for example on image 33 series 3. Soft tissues Arterial atherosclerosis. Calcification of the vas deferens as can be encountered in the setting of diabetes. Mild chondrocalcinosis in the pubic symphysis. IMPRESSION: 1. Right femoral neck fracture involving the transcervical region superiorly and extending to the basicervical region inferiorly. The fracture appears to be intra-articular without substantial trochanteric component. 20 degrees of apex anterior angulation along with some mild varus angulation. Minimal comminution inferiorly. 2. Atherosclerosis.  Electronically Signed   By: Van Clines M.D.   On: 11/02/2021 11:21   CT 3D RECON AT SCANNER  Result Date: 11/02/2021 CLINICAL DATA:  Right hip fracture EXAM: CT OF THE RIGHT HIP WITHOUT CONTRAST 3D ACQUISITION WORKSTATION RECONSTRUCTION TECHNIQUE: Multidetector CT imaging of the right hip was performed according to the standard protocol. Multiplanar CT image reconstructions were also generated. Three-dimensional reconstructions of the right hip and associated fracture were performed on the acquisition workstation. COMPARISON:  Radiographs 11/02/2021 FINDINGS: Bones/Joint/Cartilage Somewhat vertical fracture through the right femoral  neck involves the transcervical region superiorly but extends to the basicervical region inferiorly. The fracture appears to be intra-articular and no true intertrochanteric component is identified. 28 degrees of apex anterior angulation as measured on image 43 series 2. There is also some mild varus angulation and some minimal comminution of the fracture inferiorly. No regional pelvic fracture. Small bone island in the right ischium. Ligaments Suboptimally assessed by CT. Muscles and Tendons Aside from mild indistinctness of fascia planes in the immediate vicinity of the fracture, regional musculotendinous structures appear unremarkable. Trace edema anterior to the right iloipsoas muscle for example on image 33 series 3. Soft tissues Arterial atherosclerosis. Calcification of the vas deferens as can be encountered in the setting of diabetes. Mild chondrocalcinosis in the pubic symphysis. IMPRESSION: 1. Right femoral neck fracture involving the transcervical region superiorly and extending to the basicervical region inferiorly. The fracture appears to be intra-articular without substantial trochanteric component. 20 degrees of apex anterior angulation along with some mild varus angulation. Minimal comminution inferiorly. 2. Atherosclerosis. Electronically Signed   By: Van Clines M.D.   On: 11/02/2021 11:21   DG Hip Unilat  With Pelvis 2-3 Views Right  Result Date: 11/02/2021 CLINICAL DATA:  60 year old male status post fall landing on right hip. Unable to weightbear. EXAM: DG HIP (WITH OR WITHOUT PELVIS) 2-3V RIGHT COMPARISON:  None. FINDINGS: Femoral heads are normally located. Mildly impacted fracture of the distal right femoral neck. But possible nondisplaced comminution of the intertrochanteric segment also. Proximal right femoral shaft intact. No superimposed pelvis fracture identified. Grossly intact proximal left femur. Extensive vascular calcifications in and around the pelvis. SI joints appear  symmetric. Paucity of bowel gas in the lower abdomen. IMPRESSION: Positive for acute mildly impacted fracture of the proximal right femur. Fractured distal femoral neck, but suspect intertrochanteric involvement also. Electronically Signed   By: Genevie Ann M.D.   On: 11/02/2021 05:24    Positive ROS: All other systems have been reviewed and were otherwise negative with the exception of those mentioned in the HPI and as above.  Physical Exam: General: Alert, no acute distress  MUSCULOSKELETAL: Right hip: Patient skin is intact.  He has mild shortening and external rotation of the right lower extremity.  Patient has palpable pedal pulses.  He has intact sensation light touch but sensation is diminished due to diabetic neuropathy.  Patient's feet are well-perfused.  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  Patient's leg and thigh compartments are soft and compressible.  Assessment: Right basicervical/intertrochanteric hip fracture  Plan: I personally reviewed the patient's x-rays and CT scan.  Patient has a basicervical/intertrochanteric hip fracture.  This has moderate rotation but mild displacement.  I have recommended intramedullary fixation for the patient's fracture.  I discussed the details of the operation as well as the postoperative course with the patient.  I discussed the risks and benefits of surgery. The risks include but are not limited  to infection, bleeding requiring blood transfusion, nerve or blood vessel injury, joint stiffness or loss of motion, persistent pain, weakness or instability, malunion, nonunion and hardware failure and the need for further surgery. Medical risks include but are not limited to DVT and pulmonary embolism, myocardial infarction, stroke, pneumonia, respiratory failure and death. Patient understood these risks and wished to proceed.    Thornton Park, MD    11/02/2021 12:50 PM

## 2021-11-02 NOTE — Anesthesia Preprocedure Evaluation (Signed)
Anesthesia Evaluation  Patient identified by MRN, date of birth, ID band Patient awake    Reviewed: Allergy & Precautions, H&P , NPO status , Patient's Chart, lab work & pertinent test results, reviewed documented beta blocker date and time   Airway Mallampati: III   Neck ROM: full    Dental  (+) Poor Dentition   Pulmonary sleep apnea ,    Pulmonary exam normal        Cardiovascular Exercise Tolerance: Good hypertension, On Medications +CHF  Normal cardiovascular exam Rhythm:regular Rate:Normal     Neuro/Psych PSYCHIATRIC DISORDERS Depression CVA    GI/Hepatic Neg liver ROS, GERD  ,  Endo/Other  negative endocrine ROSdiabetes, Type 1, Insulin Dependent  Renal/GU Renal disease  negative genitourinary   Musculoskeletal   Abdominal   Peds  Hematology  (+) Blood dyscrasia, anemia ,   Anesthesia Other Findings Past Medical History: No date: Diabetes mellitus without complication ([facility]) No date: Hypertension No date: Neuropathy No date: Renal disorder No date: Stroke [facility])     Comment:  2016 Past Surgical History: No date: TONSILLECTOMY BMI    Body Mass Index: 32.23 kg/m     Reproductive/Obstetrics negative OB ROS                             Anesthesia Physical Anesthesia Plan  ASA: 3 and emergent  Anesthesia Plan: General and Spinal   Post-op Pain Management:    Induction:   PONV Risk Score and Plan: 3  Airway Management Planned:   Additional Equipment:   Intra-op Plan:   Post-operative Plan:   Informed Consent: I have reviewed the patients History and Physical, chart, labs and discussed the procedure including the risks, benefits and alternatives for the proposed anesthesia with the patient or authorized representative who has indicated his/her understanding and acceptance.     Dental Advisory Given  Plan Discussed with: CRNA  Anesthesia Plan Comments:          Anesthesia Quick Evaluation

## 2021-11-02 NOTE — H&P (Signed)
History and Physical    Bobby Sampson WUJ:[id number] DOB: [birth year] DOA: 11/02/2021  Referring MD/NP/PA:   PCP: [facility] [place]   Patient coming from:  The patient is coming from home.  At baseline, pt is independent for most of ADL.        Chief Complaint: fall and right hip pain  HPI: Bobby Sampson is a 60 y.o. male with medical history significant of ESRD-HD (MWF), HTN, HLD, DM, stroke with right sided weakness, GERD, depression, peripheral neuropathy, OSA not using CPAP consistently, dCHF, who presents with a fall and right hip pain.  Patient states that he fell when he was going to bathroom but his knees gave away last night.  He landed on right hip, causing pain in his right hip.  Patient denies loss of consciousness.  He strongly denies any head or neck injury.  He refused CT scan of head and neck.  His right hip pain is constant, severe, sharp, nonradiating. He can not bear weight on right leg. Patient does not have chest pain, cough, shortness breath.  No fever or chills.  Patient states that he had nausea and vomited once after he was given a dose of IV pain medication.  Currently patient does not have nausea, vomiting, diarrhea or abdominal pain.  No symptoms of UTI.  He had dialysis yesterday.  He states that he ate a piece of pie at 0000, and did not take his evening insulin.  Per report, patient had oxygen desaturation initially, put on 2 L oxygen. When I saw patient in the ED, he does not have any shortness of breath.  I took off his nasal cannula oxygen.  He has oxygen saturation 94%.  ED Course: pt was found to have WBC 9.8, negative COVID PCR, potassium 3.8, bicarbonate 30, creatinine 3.76, BUN 22, temperature normal, blood pressure 177/94, heart rate 83, RR 18.  Chest x-ray negative.  X-ray showed right hip fracture.  Patient is admitted to the MedSurg bed as inpatient. Dr. Mack Guise of Ortho and Dr. Theador Hawthorne of renal are consulted.   X-ray of right  hip/pelvis: Positive for acute mildly impacted fracture of the proximal right femur. Fractured distal femoral neck, but suspect intertrochanteric involvement also.  Review of Systems:   General: no fevers, chills, no body weight gain, has fatigue HEENT: no blurry vision, hearing changes or sore throat Respiratory: no dyspnea, coughing, wheezing CV: no chest pain, no palpitations GI: currently no nausea, vomiting, abdominal pain, diarrhea, constipation GU: no dysuria, burning on urination, increased urinary frequency, hematuria  Ext: no leg edema Neuro: no unilateral weakness, numbness, or tingling, no vision change or hearing loss. Has fall. Skin: no rash, no skin tear. MSK: has pain in right hip Heme: No easy bruising.  Travel history: No recent long distant travel.  Allergy:  Allergies  Allergen Reactions   Trulicity [Dulaglutide] Nausea And Vomiting   Liraglutide Nausea And Vomiting    Other reaction(s): Nausea and vomiting, Fatigue    Past Medical History:  Diagnosis Date   Diabetes mellitus without complication ([place])    Hypertension    Neuropathy    Renal disorder    Stroke [facility])    2016    Past Surgical History:  Procedure Laterality Date   TONSILLECTOMY      Social History:  reports that he has never smoked. He has never used smokeless tobacco. He reports that he does not currently use alcohol. He reports that he does not currently use drugs.  Family History:  Family History  Problem Relation Age of Onset   Hypertension Mother    Diabetes Mother    Kidney disease Mother      Prior to Admission medications   Medication Sig Start Date End Date Taking? Authorizing Provider  aspirin EC 81 MG tablet Take 81 mg by mouth daily.    [provider]  atorvastatin (LIPITOR) 80 MG tablet Take 80 mg by mouth daily.    [provider]  Calcium Acetate 667 MG TABS Take 667 mg by mouth 3 (three) times daily with meals.    [provider]   carvedilol (COREG) 6.25 MG tablet Take 6.25 mg by mouth 2 (two) times daily with a meal.    [provider]  cholecalciferol (VITAMIN D) 25 MCG (1000 UT) tablet Take 2,000 Units by mouth daily.    [provider]  escitalopram (LEXAPRO) 20 MG tablet Take 20 mg by mouth daily. 01/10/21   [provider]  insulin glargine (LANTUS SOLOSTAR) 100 UNIT/ML Solostar Pen Inject 20 Units into the skin every 12 (twelve) hours. 02/16/21   Wieting, Richard, MD  latanoprost (XALATAN) 0.005 % ophthalmic solution Place 1 drop into both eyes at bedtime. 12/21/20   [provider]  loratadine (CLARITIN) 10 MG tablet Take 10 mg by mouth daily.    [provider]  losartan (COZAAR) 25 MG tablet Take 25 mg by mouth daily. 12/21/20   [provider]  Multiple Vitamin (MULTIVITAMIN WITH MINERALS) TABS tablet Take 1 tablet by mouth daily.    [provider]  NOVOLOG FLEXPEN 100 UNIT/ML FlexPen Inject 6-8 Units into the skin 3 (three) times daily. 01/02/21   [provider]  pantoprazole (PROTONIX) 40 MG tablet Take 1 tablet (40 mg total) by mouth daily. 02/16/21 03/18/21  Loletha Grayer, MD  torsemide (DEMADEX) 20 MG tablet Take 40 mg by mouth daily. 01/04/21   [provider]  traZODone (DESYREL) 50 MG tablet Take 50 mg by mouth at bedtime as needed for sleep. 12/04/20   [provider]    Physical Exam: Vitals:   11/02/21 0700 11/02/21 0730 11/02/21 0800 11/02/21 0822  BP: (!) 177/85 (!) 179/85 (!) 182/89 (!) 168/83  Pulse: 82 83 82 83  Resp:      Temp:      TempSrc:      SpO2: 93% 95% 95% 91%  Weight:       General: Not in acute distress HEENT:       Eyes: PERRL, EOMI, no scleral icterus.       ENT: No discharge from the ears and nose, no pharynx injection, no tonsillar enlargement.        Neck: No JVD, no bruit, no mass felt. Heme: No neck lymph node enlargement. Cardiac: S1/S2, RRR, No murmurs, No gallops or  rubs. Respiratory: No rales, wheezing, rhonchi or rubs. GI: Soft, nondistended, nontender, no rebound pain, no organomegaly, BS present. GU: No hematuria Ext: No pitting leg edema bilaterally. 1+DP/PT pulse bilaterally. Musculoskeletal: Has severe tenderness in the right hip Skin: No rashes.  Neuro: Alert, oriented X3, cranial nerves II-XII grossly intact, has right sided weakness from previous stroke Psych: Patient is not psychotic, no suicidal or hemocidal ideation.  Labs on Admission: I have personally reviewed following labs and imaging studies  CBC: Recent Labs  Lab 11/02/21 0416  WBC 9.8  HGB 11.9*  HCT 37.2*  MCV 83.0  PLT 401   Basic Metabolic Panel: Recent Labs  Lab 11/02/21 0416  NA 132*  K 3.8  CL 96*  CO2 30  GLUCOSE 416*  BUN 22  CREATININE 3.76*  CALCIUM 8.3*   GFR: CrCl cannot be calculated (Unknown ideal weight.). Liver Function Tests: No results for input(s): AST, ALT, ALKPHOS, BILITOT, PROT, ALBUMIN in the last 168 hours. No results for input(s): LIPASE, AMYLASE in the last 168 hours. No results for input(s): AMMONIA in the last 168 hours. Coagulation Profile: No results for input(s): INR, PROTIME in the last 168 hours. Cardiac Enzymes: No results for input(s): CKTOTAL, CKMB, CKMBINDEX, TROPONINI in the last 168 hours. BNP (last 3 results) No results for input(s): PROBNP in the last 8760 hours. HbA1C: No results for input(s): HGBA1C in the last 72 hours. CBG: Recent Labs  Lab 11/02/21 0811  GLUCAP 272*   Lipid Profile: No results for input(s): CHOL, HDL, LDLCALC, TRIG, CHOLHDL, LDLDIRECT in the last 72 hours. Thyroid Function Tests: No results for input(s): TSH, T4TOTAL, FREET4, T3FREE, THYROIDAB in the last 72 hours. Anemia Panel: No results for input(s): VITAMINB12, FOLATE, FERRITIN, TIBC, IRON, RETICCTPCT in the last 72 hours. Urine analysis:    Component Value Date/Time   COLORURINE STRAW (A) 02/15/2021 1600   APPEARANCEUR CLEAR  (A) 02/15/2021 1600   LABSPEC 1.008 02/15/2021 1600   PHURINE 5.0 02/15/2021 1600   GLUCOSEU >=500 (A) 02/15/2021 1600   HGBUR NEGATIVE 02/15/2021 1600   BILIRUBINUR NEGATIVE 02/15/2021 1600   KETONESUR NEGATIVE 02/15/2021 1600   PROTEINUR 100 (A) 02/15/2021 1600   NITRITE NEGATIVE 02/15/2021 1600   LEUKOCYTESUR NEGATIVE 02/15/2021 1600   Sepsis Labs: @LABRCNTIP (procalcitonin:4,lacticidven:4) ) Recent Results (from the past 240 hour(s))  Resp Panel by RT-PCR (Flu A&B, Covid) Nasopharyngeal Swab     Status: None   Collection Time: 11/02/21  5:22 AM   Specimen: Nasopharyngeal Swab; Nasopharyngeal(NP) swabs in vial transport medium  Result Value Ref Range Status   SARS Coronavirus 2 by RT PCR NEGATIVE NEGATIVE Final    Comment: (NOTE) SARS-CoV-2 target nucleic acids are NOT DETECTED.  The SARS-CoV-2 RNA is generally detectable in upper respiratory specimens during the acute phase of infection. The lowest concentration of SARS-CoV-2 viral copies this assay can detect is 138 copies/mL. A negative result does not preclude SARS-Cov-2 infection and should not be used as the sole basis for treatment or other patient management decisions. A negative result may occur with  improper specimen collection/handling, submission of specimen other than nasopharyngeal swab, presence of viral mutation(s) within the areas targeted by this assay, and inadequate number of viral copies(<138 copies/mL). A negative result must be combined with clinical observations, patient history, and epidemiological information. The expected result is Negative.  Fact Sheet for Patients:  EntrepreneurPulse.com.au  Fact Sheet for Healthcare Providers:  IncredibleEmployment.be  This test is no t yet approved or cleared by the [place] FDA and  has been authorized for detection and/or diagnosis of SARS-CoV-2 by FDA under an Emergency Use Authorization (EUA). This EUA will  remain  in effect (meaning this test can be used) for the duration of the COVID-19 declaration under Section 564(b)(1) of the Act, 21 U.S.C.section 360bbb-3(b)(1), unless the authorization is terminated  or revoked sooner.       Influenza A by PCR NEGATIVE NEGATIVE Final   Influenza B by PCR NEGATIVE NEGATIVE Final    Comment: (NOTE) The Xpert Xpress SARS-CoV-2/FLU/RSV plus assay is intended as an aid in the diagnosis of influenza from Nasopharyngeal swab specimens and should not be used as a sole  basis for treatment. Nasal washings and aspirates are unacceptable for Xpert Xpress SARS-CoV-2/FLU/RSV testing.  Fact Sheet for Patients: EntrepreneurPulse.com.au  Fact Sheet for Healthcare Providers: IncredibleEmployment.be  This test is not yet approved or cleared by the [place] FDA and has been authorized for detection and/or diagnosis of SARS-CoV-2 by FDA under an Emergency Use Authorization (EUA). This EUA will remain in effect (meaning this test can be used) for the duration of the COVID-19 declaration under Section 564(b)(1) of the Act, 21 U.S.C. section 360bbb-3(b)(1), unless the authorization is terminated or revoked.  Performed at [facility], [address] [place], [place] [address]      Radiological Exams on Admission: DG Chest 1 View  Result Date: 11/02/2021 CLINICAL DATA:  60 year old male status post fall with right hip fracture. EXAM: CHEST  1 VIEW COMPARISON:  Portable chest 11/07/2019. FINDINGS: Portable AP supine view at 0444 hours. Right chest dual lumen dialysis type catheter in place. Normal lung volumes and mediastinal contours. Visualized tracheal air column is within normal limits. Allowing for portable technique the lungs are clear. No acute osseous abnormality identified. Negative visible bowel gas. IMPRESSION: Right chest dialysis type catheter. Otherwise Negative portable chest. Electronically Signed    By: Genevie Ann M.D.   On: 11/02/2021 05:25   DG Hip Unilat  With Pelvis 2-3 Views Right  Result Date: 11/02/2021 CLINICAL DATA:  60 year old male status post fall landing on right hip. Unable to weightbear. EXAM: DG HIP (WITH OR WITHOUT PELVIS) 2-3V RIGHT COMPARISON:  None. FINDINGS: Femoral heads are normally located. Mildly impacted fracture of the distal right femoral neck. But possible nondisplaced comminution of the intertrochanteric segment also. Proximal right femoral shaft intact. No superimposed pelvis fracture identified. Grossly intact proximal left femur. Extensive vascular calcifications in and around the pelvis. SI joints appear symmetric. Paucity of bowel gas in the lower abdomen. IMPRESSION: Positive for acute mildly impacted fracture of the proximal right femur. Fractured distal femoral neck, but suspect intertrochanteric involvement also. Electronically Signed   By: Genevie Ann M.D.   On: 11/02/2021 05:24     EKG: I have personally reviewed.  Sinus rhythm, QTC 478, T wave inversion in the inferior leads and V3-V6 which is no significant change from a previous EKG on 02/15/2021.  Assessment/Plan Principal Problem:   Closed right hip fracture (HCC) Active Problems:   Essential hypertension   Stroke (HCC)   Type II diabetes mellitus with renal manifestations ([place])   Fall   ESRD on dialysis ([place])   Anemia in ESRD (end-stage renal disease) (HCC)   GERD (gastroesophageal reflux disease)   Depression   Chronic diastolic CHF (congestive heart failure) ([place])   Closed right hip fracture ([place]):  X-ray showed acute mildly impacted fracture of the proximal right femur. Fractured distal femoral neck, but suspect intertrochanteric involvement also. Dr. Mack Guise is consulted.  - will admit to Med-surg bed - Pain control: morphine prn and percocet - When necessary Zofran for nausea - Robaxin for muscle spasm - type and cross - INR/PTT - PT/OT when able to (not ordered now)  Essential  hypertension -IV hydralazine as needed -Continue home amlodipine, metoprolol  Stroke (HCC) -Aspirin, Lipitor  Type II diabetes mellitus with renal manifestations [facility]): Recent A1c 11.0, poorly controlled.  Patient is taking NovoLog and Lantus 18-20 unit twice daily at home.  Blood sugar 416 --> 272 after given 10 units of NovoLog in ED.  Anion gap 6. -Change Lantus dose to 14 units twice daily -Sliding scale  insulin  Fall -pt/ot when able to   ESRD on dialysis (MWF):  -consulted Dr. Theador Hawthorne of renal  Anemia in ESRD (end-stage renal disease) [facility]): Hgb stable, 11.9 -f/u CBC  GERD (gastroesophageal reflux disease) -protonix  Depression: -Wellbutrin, Prozac  Chronic diastolic CHF (congestive heart failure) ([place]): 2D echo on 06/13/2020 showed EF > 55% with grade 1 diastolic dysfunction.  Patient does not have leg edema.  No JVD.  CHF seem to be compensated. -Volume management per renal by dialysis -Will hold torsemide while patient is on n.p.o.   Perioperative Cardiac Risk: pt has multiple comorbidities, as listed above. He has history of dCHF, no hx of heart attack.  2D echo on 06/13/2020 showed EF > 50% with grade 1 diastolic dysfunction.  EKG showed T wave inversion in inferior leads and V3-V6 which seem to be old and existed in previous EKG on 02/15/2021.  Currently patient does not have chest pain, shortness breath or cough. Pt has been newly started dialysis in the past 69-month. He has been compliant to dialysis.  He seems to be euvolemic today. At this time point, no further work up is needed. Patient's GUPTA score perioperative myocardial infarction or cardaic arrest is 2 %. I discussed the risk with patient, pt would like to proceed for surgery.   DVT ppx: SCD Code Status: Full code Family Communication: Yes, patient's daughter by phone Disposition Plan:  Anticipate discharge back to previous environment Consults called:  Dr. Mack Guise of Ortho and Dr. Theador Hawthorne of renal are  consulted. Admission status and Level of care: Med-Surg:   as inpt      Status is: Inpatient  Remains inpatient appropriate because: Patient has multiple comorbidities, now presents with fall and right hip fracture, patient will need surgery.  Patient will need to be treated in hospital for at least 2 days.          Date of Service 11/02/2021    Ivor Costa [facility]   If 7PM-7AM, please contact night-coverage [email or web address] 11/02/2021, 8:44 AM

## 2021-11-02 NOTE — ED Triage Notes (Signed)
Pt in from home via [facility] after falling after his knees gave out when getting OOB to go to bathroom, landing onto R hip. Outward rotation and limited ROM to R hip, pt unable to bear weight onto RLE. Denies any other injuires or thinners. Pt is hyperglycemic (CBG 520), and is a diabetic. States he last ate a piece of pie at 0000, and did not take his evening insulin. Also a dialysis pt, MWF, last dialyzed yesterday. Denies any sob or cp. Given 126mcg Fentanyl and 4mg  Zofran en route with EMS. Hx CVA with R side deficits

## 2021-11-02 NOTE — Transfer of Care (Signed)
Immediate Anesthesia Transfer of Care Note  Patient: Bobby Sampson  Procedure(s) Performed: INTRAMEDULLARY (IM) NAIL INTERTROCHANTRIC (Right: Hip)  Patient Location: PACU  Anesthesia Type:General  Level of Consciousness: drowsy  Airway & Oxygen Therapy: Patient Spontanous Breathing and Patient connected to nasal cannula oxygen  Post-op Assessment: Report given to RN and Post -op Vital signs reviewed and stable  Post vital signs: Reviewed and stable  Last Vitals:  Vitals Value Taken Time  BP    Temp    Pulse 82 11/02/21 1536  Resp 12 11/02/21 1536  SpO2 97 % 11/02/21 1536  Vitals shown include unvalidated device data.  Last Pain:  Vitals:   11/02/21 0936  TempSrc:   PainSc: 4       Patients Stated Pain Goal: 0 (35/52/17 4715)  Complications: No notable events documented.

## 2021-11-02 NOTE — Anesthesia Procedure Notes (Signed)
Spinal  Patient location during procedure: OR Reason for block: surgical anesthesia Staffing Performed: anesthesiologist and resident/CRNA  Preanesthetic Checklist Completed: patient identified, IV checked, site marked, risks and benefits discussed, surgical consent, monitors and equipment checked, pre-op evaluation and timeout performed Spinal Block Patient position: right lateral decubitus Prep: DuraPrep Patient monitoring: heart rate, cardiac monitor, continuous pulse ox and blood pressure Approach: midline Location: L4-5 Injection technique: single-shot Needle Needle type: Sprotte and Quincke  Needle gauge: 24 G Needle length: 9 cm Assessment Sensory level: T4 Events: CSF return Additional Notes Two placements performed with deep achievement of csf at L5S1.  Good flow. No paresthesia.  Tolerated well

## 2021-11-02 NOTE — Anesthesia Procedure Notes (Signed)
Procedure Name: LMA Insertion Date/Time: 11/02/2021 2:53 PM Performed by: Orion Crook, CRNA Pre-anesthesia Checklist: Emergency Drugs available, Patient identified, Suction available and Patient being monitored Patient Re-evaluated:Patient Re-evaluated prior to induction Oxygen Delivery Method: Circle system utilized Preoxygenation: Pre-oxygenation with 100% oxygen Induction Type: IV induction LMA: LMA inserted LMA Size: 4.0 Number of attempts: 1 Tube secured with: Tape Dental Injury: Teeth and Oropharynx as per pre-operative assessment

## 2021-11-02 NOTE — Op Note (Addendum)
DATE OF SURGERY:  11/02/2021  TIME: 3:30 PM  PATIENT NAME:  Bobby Sampson  AGE: 60 y.o.  PRE-OPERATIVE DIAGNOSIS:  Right basicervical hip fracture  POST-OPERATIVE DIAGNOSIS:  SAME  PROCEDURE: Intramedullary fixation of right basicervical hip fracture  SURGEON:  Thornton Park  EBL:  50 cc  OPERATIVE IMPLANTS: Biomet short Affixus nail 11 x 180 mm, 105 mm lag screw with a 42 mm distal interlocking screw  PREOPERATIVE INDICATIONS:  Bobby Sampson is a 60 y.o. year old who fell and suffered a hip fracture. He was brought into the ER and then admitted to the hospital service and medically cleared for surgical fixation of his fracture.    Right hip x-rays and CT scan demonstrated a fracture extending from the lateral femoral neck to the basicervical region medially with external rotation deformity but no significant displacement.  Given the lack of displacement of the posterior cortex of the femoral neck I felt the medial circumflex vessels may still be intact and the decision was made to proceed with intramedullary fixation of this fracture.  Patient is hemodialysis dependent and is status post stroke and I therefore felt he would benefit from a less invasive surgery to try and fix the hip versus proceeding with a hemiarthroplasty.    The risks, benefits and alternatives were discussed with the patient.  The risks include but are not limited to infection, bleeding requiring blood transfusion, nerve or blood vessel injury, avascular necrosis, malunion, nonunion, hardware prominence, hardware failure, change in leg lengths or lower extremity rotation need for further surgery including hardware removal with conversion to a hemi versus total hip arthroplasty. Medical risks include but are not limited to DVT and pulmonary embolism, myocardial infarction, stroke, pneumonia, respiratory failure and death. The patient and their family understood these risks and wished to proceed with surgery.  OPERATIVE  PROCEDURE:  The patient was met in the preoperative area.  His right hip was marked according to hospital's correct site of surgery protocol.  The patient was brought to the operating room and placed in the supine position on the fracture table.  Spinal anesthesia was administered, but the patient was converted to general anesthesia during the case.  A time out was performed to verify the patient's name, date of birth, medical record number, correct site of surgery correct procedure to be performed. The timeout was also used to verify the patient received antibiotics and all appropriate instruments, implants and radiographic studies were available in the room. Once all in attendance were in agreement, the case began. A closed reduction was performed with gentle longitudinal traction and internal rotation.  The fracture reduction was confirmed on both AP and lateral views.  The patient was prepped and draped in a sterile fashion. The patient received 2 g of Ancef IV for preoperative antibiotics.  An incision was made proximal to the greater trochanter in line with the femur. A guidewire was placed over the tip of the greater trochanter and advanced into the proximal femur to the level of the lesser trochanter.  Confirmation of the drill pin position was made on AP and lateral C-arm images.  The threaded guidepin was then overdrilled with the proximal femoral drill.  Sequential reamers were then used up to 13 mm.  A short 11 x 180 mm intramedullary nail was then inserted into the proximal femur, across the fracture site and into the femoral shaft. Its position was confirmed on AP and lateral C-arm images.   Once the nail was completely seated,  the drill guide for the lag screw was placed through the guide arm for the Affixus nail. A guidepin was then placed through this drill guide and advanced through the lateral cortex of the femur, across the fracture site and into the femoral head achieving a tip apex  distance of less than 25 mm. The depth of the drill pin was measured to be 105 mm, and then the drill for the lag screw was advanced over the guidepin and through the lateral cortex, across the fracture site and up into the femoral head to the depth previously measured.  The lag screw was then advanced by hand into position across the fracture site into the femoral head. Its final position was confirmed on AP and lateral C-arm images. Compression was applied as traction was carefully released. The set screw in the top of the intramedullary rod was tightened by hand using a screwdriver. It was backed off a quarter turn to allow for compression at the fracture site.  The drill sleeve for the distal interlocking screw was then placed through the Affixus guide arm. A small stab incision was made to allow the drill guide to approximate the lateral cortex of the femur. The drill for the distal interlocking screw was then advanced bicortically. The depth of this drill was measured. A distal interlocking screw with the length of 42 was then inserted by hand through the guide arm. Final C-arm images of the entire intramedullary construct were taken in both the AP and lateral planes.   The wounds were irrigated copiously and closed with 0 Vicryl for closure of the deep fascia and 2-0 Vicryl for subcutaneous closure. The skin was approximated with staples. A dry sterile dressing was applied. I was scrubbed and present the entire case and all sharp and instrument counts were correct at the conclusion of the case. Patient was transferred to hospital bed and brought to PACU in stable condition. I spoke with the patient's wife postoperatively to let her know the case had been performed without complication and the patient was stable in the recovery room.     Timoteo Gaul, MD

## 2021-11-02 NOTE — Consult Note (Addendum)
Bobby Sampson MRN: [id number] DOB/AGE: 62/23/1962 62 y.o. Primary Care Physician:[facility] date: 11/02/2021 Chief Complaint:  Chief Complaint  Patient presents with   Hyperglycemia   Hip Pain   HPI: Bobby Sampson is a 60 y.o. male with medical history significant of ESRD-HD (MWF), HTN, HLD, DM, stroke with right sided weakness, GERD, depression, peripheral neuropathy, OSA not using CPAP consistently, dCHF, who presents with a fall and right hip pain.   History of present illness dates back to last night when patient fell when he was going to bathroom . Patient said he landed on right hip, causing pain in his right hip.   No complaint of loss of consciousness No complaint of any head or neck injury.   Upon evaluation in the ER, patient had x-ray of right hip/pelvis which was positive for acute mildly impacted fracture of the proximal right femur. Fractured distal femoral neck. Patient was seen today on first floor Patient family was present in the room as well Patient main concern continues to be pain in his right hip Patient is on Monday Wednesday Friday dialysis Patient was last dialyzed yesterday No complaint of fever/cough/chills neck no complaint of recent COVID exposure No complaint of shortness of breath   Past Medical History:  Diagnosis Date   Diabetes mellitus without complication ([place])    Hypertension    Neuropathy    Renal disorder    Stroke [facility])    2016        Family History  Problem Relation Age of Onset   Hypertension Mother    Diabetes Mother    Kidney disease Mother     Social History:  reports that he has never smoked. He has never used smokeless tobacco. He reports that he does not currently use alcohol. He reports that he does not currently use drugs.   Allergies:  Allergies  Allergen Reactions   Liraglutide Nausea And Vomiting    Other reaction(s): Nausea and vomiting, Fatigue   Trulicity [Dulaglutide] Nausea And  Vomiting    Medications Prior to Admission  Medication Sig Dispense Refill   amLODipine (NORVASC) 2.5 MG tablet Take 2.5 mg by mouth daily.     atorvastatin (LIPITOR) 80 MG tablet Take 80 mg by mouth daily.     buPROPion (WELLBUTRIN XL) 150 MG 24 hr tablet Take 150 mg by mouth daily.     cholecalciferol (VITAMIN D) 25 MCG (1000 UT) tablet Take 2,000 Units by mouth daily.     FLUoxetine (PROZAC) 20 MG capsule Take 20 mg by mouth daily.     insulin glargine (LANTUS SOLOSTAR) 100 UNIT/ML Solostar Pen Inject 20 Units into the skin every 12 (twelve) hours. 15 mL 11   latanoprost (XALATAN) 0.005 % ophthalmic solution Place 1 drop into both eyes at bedtime.     loratadine (CLARITIN) 10 MG tablet Take 10 mg by mouth daily.     metoprolol succinate (TOPROL-XL) 25 MG 24 hr tablet Take 25 mg by mouth daily.     NOVOLOG FLEXPEN 100 UNIT/ML FlexPen Inject 10 Units into the skin 3 (three) times daily with meals.     pantoprazole (PROTONIX) 40 MG tablet Take 1 tablet (40 mg total) by mouth daily. 30 tablet 0   aspirin EC 81 MG tablet Take 81 mg by mouth daily.     Calcium Acetate 667 MG TABS Take 667 mg by mouth 3 (three) times daily with meals. (Patient not taking: Reported on 11/02/2021)     carvedilol (COREG)  6.25 MG tablet Take 6.25 mg by mouth 2 (two) times daily with a meal. (Patient not taking: Reported on 11/02/2021)     escitalopram (LEXAPRO) 20 MG tablet Take 20 mg by mouth daily. (Patient not taking: Reported on 11/02/2021)     losartan (COZAAR) 25 MG tablet Take 25 mg by mouth daily. (Patient not taking: Reported on 11/02/2021)     midodrine (PROAMATINE) 2.5 MG tablet Take 2.5 mg by mouth as directed.     Multiple Vitamin (MULTIVITAMIN WITH MINERALS) TABS tablet Take 1 tablet by mouth daily. (Patient not taking: Reported on 11/02/2021)     ondansetron (ZOFRAN-ODT) 8 MG disintegrating tablet Take 8 mg by mouth every 8 (eight) hours as needed for nausea.     torsemide (DEMADEX) 20 MG tablet Take 40 mg  by mouth daily. (Patient not taking: Reported on 11/02/2021)     traZODone (DESYREL) 50 MG tablet Take 50 mg by mouth at bedtime as needed for sleep. (Patient not taking: Reported on 11/02/2021)         RAQ:TMAUQ from the symptoms mentioned above,there are no other symptoms referable to all systems reviewed.   amLODipine  2.5 mg Oral Daily   [START ON 11/03/2021] aspirin EC  81 mg Oral Daily   atorvastatin  80 mg Oral Daily   buPROPion  150 mg Oral Daily   cholecalciferol  2,000 Units Oral Daily   FLUoxetine  20 mg Oral Daily   insulin aspart  0-5 Units Subcutaneous QHS   insulin aspart  0-9 Units Subcutaneous TID WC   insulin glargine-yfgn  14 Units Subcutaneous BID   latanoprost  1 drop Both Eyes QHS   loratadine  10 mg Oral Daily   metoprolol succinate  25 mg Oral Daily   pantoprazole  40 mg Oral Daily         JFH:LKTGY from the symptoms mentioned above,there are no other symptoms referable to all systems reviewed.  Physical Exam: Vital signs in last 24 hours: Temp:  [97.8 F (36.6 C)] 97.8 F (36.6 C) (12/03 0906) Pulse Rate:  [82-84] 84 (12/03 0906) Resp:  [16-20] 20 (12/03 0906) BP: (164-188)/(78-94) 164/78 (12/03 0906) SpO2:  [91 %-98 %] 98 % (12/03 0906) Weight:  [99 kg] 99 kg (12/03 0405) Weight change:  Last BM Date: 11/02/21  Intake/Output from previous day: No intake/output data recorded. No intake/output data recorded.   Physical Exam: General- pt is awake,alert, oriented to time place and person  Resp- No acute REsp distress, CTA B/L NO Rhonchi  CVS- S1S2 regular in rate and rhythm  GIT- BS+, soft, NT, ND  EXT- NO LE Edema, Cyanosis           Patient has tenderness on the right hip  CNS- CN 2-12 grossly intact.   Psych- normal mod and affect  Access- PC +  AVF Patient is currently being dialyzed through permacath Patient fistula is not mature enough to use   Lab Results: CBC Recent Labs    11/02/21 0416  WBC 9.8  HGB 11.9*  HCT  37.2*  PLT 200    BMET Recent Labs    11/02/21 0416  NA 132*  K 3.8  CL 96*  CO2 30  GLUCOSE 416*  BUN 22  CREATININE 3.76*  CALCIUM 8.3*    MICRO Recent Results (from the past 240 hour(s))  Resp Panel by RT-PCR (Flu A&B, Covid) Nasopharyngeal Swab     Status: None   Collection Time: 11/02/21  5:22 AM  Specimen: Nasopharyngeal Swab; Nasopharyngeal(NP) swabs in vial transport medium  Result Value Ref Range Status   SARS Coronavirus 2 by RT PCR NEGATIVE NEGATIVE Final    Comment: (NOTE) SARS-CoV-2 target nucleic acids are NOT DETECTED.  The SARS-CoV-2 RNA is generally detectable in upper respiratory specimens during the acute phase of infection. The lowest concentration of SARS-CoV-2 viral copies this assay can detect is 138 copies/mL. A negative result does not preclude SARS-Cov-2 infection and should not be used as the sole basis for treatment or other patient management decisions. A negative result may occur with  improper specimen collection/handling, submission of specimen other than nasopharyngeal swab, presence of viral mutation(s) within the areas targeted by this assay, and inadequate number of viral copies(<138 copies/mL). A negative result must be combined with clinical observations, patient history, and epidemiological information. The expected result is Negative.  Fact Sheet for Patients:  EntrepreneurPulse.com.au  Fact Sheet for Healthcare Providers:  IncredibleEmployment.be  This test is no t yet approved or cleared by the [place] FDA and  has been authorized for detection and/or diagnosis of SARS-CoV-2 by FDA under an Emergency Use Authorization (EUA). This EUA will remain  in effect (meaning this test can be used) for the duration of the COVID-19 declaration under Section 564(b)(1) of the Act, 21 U.S.C.section 360bbb-3(b)(1), unless the authorization is terminated  or revoked sooner.       Influenza A by  PCR NEGATIVE NEGATIVE Final   Influenza B by PCR NEGATIVE NEGATIVE Final    Comment: (NOTE) The Xpert Xpress SARS-CoV-2/FLU/RSV plus assay is intended as an aid in the diagnosis of influenza from Nasopharyngeal swab specimens and should not be used as a sole basis for treatment. Nasal washings and aspirates are unacceptable for Xpert Xpress SARS-CoV-2/FLU/RSV testing.  Fact Sheet for Patients: EntrepreneurPulse.com.au  Fact Sheet for Healthcare Providers: IncredibleEmployment.be  This test is not yet approved or cleared by the [place] FDA and has been authorized for detection and/or diagnosis of SARS-CoV-2 by FDA under an Emergency Use Authorization (EUA). This EUA will remain in effect (meaning this test can be used) for the duration of the COVID-19 declaration under Section 564(b)(1) of the Act, 21 U.S.C. section 360bbb-3(b)(1), unless the authorization is terminated or revoked.  Performed at [facility], [address], [place], [place] [address]       Lab Results  Component Value Date   CALCIUM 8.3 (L) 11/02/2021   PHOS 4.6 02/16/2021      Impression:   Bobby Sampson is a 60 y.o. male with medical history significant of ESRD-HD (MWF), HTN, HLD, DM, stroke with right sided weakness, GERD, depression, peripheral neuropathy, OSA not using CPAP consistently, dCHF, who is admitted with     Closed right hip fracture ([place])   Essential hypertension   Stroke ([place])   Type II diabetes mellitus with renal manifestations ([place])   Fall   ESRD on dialysis ([place])   Anemia in ESRD (end-stage renal disease) ([place])   GERD (gastroesophageal reflux disease)   Depression   Chronic diastolic CHF (congestive heart failure) ([place])  1)ESRD End-stage renal disease Patient is on hemodialysis Patient is on Monday Wednesday Friday schedule  As an outpatient patient's dialysis orders are- Data Element Value  Order Date/Time September 30, 2021  Frequency 3X Week  Treatment Days MonWedFri  Dialyzer 180NRe Optiflux  Treatment Time (Total Minutes) 240 min  Blood Flow Rate (mL/min) 450 mL/min  Dialysate Flow Rate Manual 800   Estimated Dry Weight 98.4  kg  Dialysate Concentrate 2.0 K, 2.5 Ca, 1.0 Mg, 100 Dextrose (G2251)  Sodium (mEq/L) 137 mEq/L  Bicarb Machine Setting (mEq/L) 35 mEq/L  Dialysis Access Hemodialysis-CV Catheter-Tunneled, Chest, Right Jugular  Access Placed on Apr 18, 2021       Patient does not need renal replacement therapy today  2)HTN  Blood pressure is stable    3)Anemia of chronic disease  CBC Latest Ref Rng & Units 11/02/2021 02/16/2021 02/15/2021  WBC 4.0 - 10.5 K/uL 9.8 10.7(H) 8.3  Hemoglobin 13.0 - 17.0 g/dL 11.9(L) 11.4(L) 12.3(L)  Hematocrit 39.0 - 52.0 % 37.2(L) 33.8(L) 37.8(L)  Platelets 150 - 400 K/uL 200 212 237       HGb at goal (9--11)   4) Secondary hyperparathyroidism -CKD Mineral-Bone Disorder    Lab Results  Component Value Date   CALCIUM 8.3 (L) 11/02/2021   PHOS 4.6 02/16/2021    Secondary Hyperparathyroidism  -Patient has secondary hyperparathyroidism as patient's intact PTH was above 136 as an outpatient   Phosphorus at goal.   5)Right hip fracture Patient is being followed by orthopedics   6) Electrolytes   BMP Latest Ref Rng & Units 11/02/2021 02/16/2021 02/15/2021  Glucose 70 - 99 mg/dL 416(H) 201(H) 474(H)  BUN 8 - 23 mg/dL 22 68(H) 70(H)  Creatinine 0.61 - 1.24 mg/dL 3.76(H) 4.06(H) 4.36(H)  Sodium 135 - 145 mmol/L 132(L) 138 130(L)  Potassium 3.5 - 5.1 mmol/L 3.8 4.1 5.2(H)  Chloride 98 - 111 mmol/L 96(L) 103 95(L)  CO2 22 - 32 mmol/L 30 26 24   Calcium 8.9 - 10.3 mg/dL 8.3(L) 8.9 9.1     Sodium Hyponatremia Secondary to ESRD    Potassium Normokalemic    7)Acid base  Co2 at goal    Plan   No acute need for renal placement therapy We will continue patient on his outpatient dialysis schedule        Margareta Laureano s  [facility] 11/02/2021, 10:26 AM

## 2021-11-02 NOTE — ED Provider Notes (Signed)
[facility] Emergency Department Provider Note    ____________________________________________   I have reviewed the triage vital signs and the nursing notes.   HISTORY  Chief Complaint Hip pain   History limited by: Not Limited   HPI Bobby Sampson is a 60 y.o. male who presents to the emergency department today with primary concern for right hip pain.  The patient states he had gotten up to use the bathroom last night when he fell.  He has a history of strokes which causes some right sided weakness.  He then had his left leg give out on him which caused him to fall onto his right hip.  Had immediate onset of severe right hip pain.  No other injuries.   Records reviewed. Per medical record review patient has a history of DM. ESRD on dialysis.   Past Medical History:  Diagnosis Date   Diabetes mellitus without complication ([place])    Hypertension    Neuropathy    Renal disorder    Stroke [facility])    2016    Patient Active Problem List   Diagnosis Date Noted   Stroke ([place]) 02/16/2021   CKD (chronic kidney disease), stage IV (HCC)    Sleep apnea    Obesity (BMI 30.0-34.9)    Acute CVA (cerebrovascular accident) ([place]) 02/15/2021   Essential hypertension    Type 2 diabetes mellitus with hyperglycemia, with long-term current use of insulin (HCC)    AKI (acute kidney injury) ([place])    COVID-19 11/07/2019    Past Surgical History:  Procedure Laterality Date   TONSILLECTOMY      Prior to Admission medications   Medication Sig Start Date End Date Taking? Authorizing Provider  aspirin EC 81 MG tablet Take 81 mg by mouth daily.    [provider]  atorvastatin (LIPITOR) 80 MG tablet Take 80 mg by mouth daily.    [provider]  Calcium Acetate 667 MG TABS Take 667 mg by mouth 3 (three) times daily with meals.    [provider]  carvedilol (COREG) 6.25 MG tablet Take 6.25 mg by mouth 2 (two) times daily with a meal.    [provider]  cholecalciferol (VITAMIN D) 25 MCG (1000 UT) tablet Take 2,000 Units by mouth daily.    [provider]  escitalopram (LEXAPRO) 20 MG tablet Take 20 mg by mouth daily. 01/10/21   [provider]  insulin glargine (LANTUS SOLOSTAR) 100 UNIT/ML Solostar Pen Inject 20 Units into the skin every 12 (twelve) hours. 02/16/21   Wieting, Richard, MD  latanoprost (XALATAN) 0.005 % ophthalmic solution Place 1 drop into both eyes at bedtime. 12/21/20   [provider]  loratadine (CLARITIN) 10 MG tablet Take 10 mg by mouth daily.    [provider]  losartan (COZAAR) 25 MG tablet Take 25 mg by mouth daily. 12/21/20   [provider]  Multiple Vitamin (MULTIVITAMIN WITH MINERALS) TABS tablet Take 1 tablet by mouth daily.    [provider]  NOVOLOG FLEXPEN 100 UNIT/ML FlexPen Inject 6-8 Units into the skin 3 (three) times daily. 01/02/21   [provider]  pantoprazole (PROTONIX) 40 MG tablet Take 1 tablet (40 mg total) by mouth daily. 02/16/21 03/18/21  Loletha Grayer, MD  torsemide (DEMADEX) 20 MG tablet Take 40 mg by mouth daily. 01/04/21   [provider]  traZODone (DESYREL) 50 MG tablet Take 50 mg by mouth at bedtime as needed for sleep. 12/04/20   [provider]    Allergies Trulicity [dulaglutide] and Liraglutide  No family history on file.  Social History Social History   Tobacco Use   Smoking status: Never   Smokeless tobacco: Never  Substance Use Topics   Alcohol use: Not Currently   Drug use: Not Currently    Review of Systems Constitutional: No fever/chills Eyes: No visual changes. ENT: No sore throat. Cardiovascular: Denies chest pain. Respiratory: Denies shortness of breath. Gastrointestinal: No abdominal pain.  No nausea, no vomiting.  No diarrhea.   Genitourinary: Negative for dysuria. Musculoskeletal: Positive for right hip pain. Skin: Negative for rash. Neurological: Negative for  headaches, focal weakness or numbness.  ____________________________________________   PHYSICAL EXAM:  VITAL SIGNS: ED Triage Vitals  Enc Vitals Group     BP 11/02/21 0404 (!) 188/87     Pulse Rate 11/02/21 0404 82     Resp 11/02/21 0404 16     Temp 11/02/21 0404 97.8 F (36.6 C)     Temp Source 11/02/21 0404 Oral     SpO2 11/02/21 0404 95 %     Weight 11/02/21 0405 218 lb 4.1 oz (99 kg)   Constitutional: Alert and oriented.  Eyes: Conjunctivae are normal.  ENT      Head: Normocephalic and atraumatic.      Nose: No congestion/rhinnorhea.      Mouth/Throat: Mucous membranes are moist.      Neck: No stridor. Hematological/Lymphatic/Immunilogical: No cervical lymphadenopathy. Cardiovascular: Normal rate, regular rhythm.  No murmurs, rubs, or gallops. HD catheter to right chest.  Respiratory: Normal respiratory effort without tachypnea nor retractions. Breath sounds are clear and equal bilaterally. No wheezes/rales/rhonchi. Gastrointestinal: Soft and non tender. No rebound. No guarding.  Genitourinary: Deferred Musculoskeletal: Right leg externally rotated. Tender to palpation/manipulation of the right hip. DP 2+ in right foot.  Neurologic:  Normal speech and language. No gross focal neurologic deficits are appreciated.  Skin:  Skin is warm, dry and intact. No rash noted. Psychiatric: Mood and affect are normal. Speech and behavior are normal. Patient exhibits appropriate insight and judgment.  ____________________________________________    LABS (pertinent positives/negatives)  BMP na 132, k 3.8, glu 416, cr 3.76 CBC wbc 9.8, hgb 11.9, plt 200  ____________________________________________   EKG  I, Nance Pear, attending physician, personally viewed and interpreted this EKG  EKG Time: 0609 Rate: 83 Rhythm: sinus rhythm Axis: right axis deviation Intervals: qtc 478 QRS: narrow ST changes: no st elevation Impression: abnormal  ekg  ____________________________________________    RADIOLOGY  Right hip Right hip fracture, femoral neck with likely intertrochanteric component  CXR Negative portable chest ____________________________________________   PROCEDURES  Procedures  ____________________________________________   INITIAL IMPRESSION / ASSESSMENT AND PLAN / ED COURSE  Pertinent labs & imaging results that were available during my care of the patient were reviewed by me and considered in my medical decision making (see chart for details).   Patient presented to the emergency department today because of concerns for right hip pain after fall.  Chest x-ray is consistent with a right hip fracture.  Discussed this finding with the patient.  Discussed necessity of surgery.  Patient does have history of diabetes and sugars are elevated here.  Will give IV insulin.  Given history of end-stage renal disease and being on dialysis will hold off from any large fluid boluses at this time.  Patient does state he makes urine.  Will discuss with orthopedics and plan on admission to the hospital service.   ____________________________________________  FINAL CLINICAL IMPRESSION(S) / ED DIAGNOSES  Final diagnoses:  Closed fracture of right hip, initial encounter ([place])  Hyperglycemia     Note: This dictation was prepared with Dragon dictation. Any transcriptional errors that result from this process are unintentional     Nance Pear, MD 11/02/21 [phone]

## 2021-11-03 DIAGNOSIS — E1122 Type 2 diabetes mellitus with diabetic chronic kidney disease: Secondary | ICD-10-CM

## 2021-11-03 DIAGNOSIS — S72001A Fracture of unspecified part of neck of right femur, initial encounter for closed fracture: Secondary | ICD-10-CM | POA: Diagnosis not present

## 2021-11-03 DIAGNOSIS — N186 End stage renal disease: Secondary | ICD-10-CM | POA: Diagnosis not present

## 2021-11-03 DIAGNOSIS — Z992 Dependence on renal dialysis: Secondary | ICD-10-CM | POA: Diagnosis not present

## 2021-11-03 LAB — CBC
HCT: 35.4 % — ABNORMAL LOW (ref 39.0–52.0)
Hemoglobin: 11.4 g/dL — ABNORMAL LOW (ref 13.0–17.0)
MCH: 26.9 pg (ref 26.0–34.0)
MCHC: 32.2 g/dL (ref 30.0–36.0)
MCV: 83.5 fL (ref 80.0–100.0)
Platelets: 201 10*3/uL (ref 150–400)
RBC: 4.24 MIL/uL (ref 4.22–5.81)
RDW: 14.6 % (ref 11.5–15.5)
WBC: 11.7 10*3/uL — ABNORMAL HIGH (ref 4.0–10.5)
nRBC: 0 % (ref 0.0–0.2)

## 2021-11-03 LAB — GLUCOSE, CAPILLARY
Glucose-Capillary: 309 mg/dL — ABNORMAL HIGH (ref 70–99)
Glucose-Capillary: 274 mg/dL — ABNORMAL HIGH (ref 70–99)
Glucose-Capillary: 208 mg/dL — ABNORMAL HIGH (ref 70–99)
Glucose-Capillary: 206 mg/dL — ABNORMAL HIGH (ref 70–99)

## 2021-11-03 LAB — BASIC METABOLIC PANEL
Anion gap: 9 (ref 5–15)
BUN: 32 mg/dL — ABNORMAL HIGH (ref 8–23)
CO2: 27 mmol/L (ref 22–32)
Calcium: 8.3 mg/dL — ABNORMAL LOW (ref 8.9–10.3)
Chloride: 95 mmol/L — ABNORMAL LOW (ref 98–111)
Creatinine, Ser: 4.35 mg/dL — ABNORMAL HIGH (ref 0.61–1.24)
GFR, Estimated: 15 mL/min — ABNORMAL LOW (ref >60)
Glucose, Bld: 329 mg/dL — ABNORMAL HIGH (ref 70–99)
Potassium: 4.8 mmol/L (ref 3.5–5.1)
Sodium: 131 mmol/L — ABNORMAL LOW (ref 135–145)

## 2021-11-03 MED ORDER — ADULT MULTIVITAMIN W/MINERALS CH
1.0000 | ORAL_TABLET | Freq: Every day | ORAL | Status: DC
  Administered 2021-11-04 – 2021-11-06 (×3): 1 via ORAL
  Filled 2021-11-03 (×3): qty 1

## 2021-11-03 MED ORDER — NEPRO/CARBSTEADY PO LIQD
237.0000 mL | Freq: Three times a day (TID) | ORAL | Status: DC
  Administered 2021-11-03 – 2021-11-04 (×4): 237 mL via ORAL

## 2021-11-03 MED ORDER — CHLORHEXIDINE GLUCONATE CLOTH 2 % EX PADS
6.0000 | MEDICATED_PAD | Freq: Every day | CUTANEOUS | Status: DC
  Administered 2021-11-04 – 2021-11-06 (×3): 6 via TOPICAL

## 2021-11-03 MED ORDER — RENA-VITE PO TABS
1.0000 | ORAL_TABLET | Freq: Every day | ORAL | Status: DC
  Administered 2021-11-03 – 2021-11-05 (×3): 1 via ORAL
  Filled 2021-11-03 (×5): qty 1

## 2021-11-03 NOTE — Progress Notes (Signed)
Bobby Sampson  MRN: [id number]  DOB/AGE: Nov 25, 1961 60 y.o.  Primary Care Physician:[facility] date: 11/02/2021  Chief Complaint:  Chief Complaint  Patient presents with   Hyperglycemia   Hip Pain    S-Pt presented on  11/02/2021 with  Chief Complaint  Patient presents with   Hyperglycemia   Hip Pain  .  Patient offers no complaint of chest pain/shortness of breath    Meds   acetaminophen  1,000 mg Oral Q6H   amLODipine  2.5 mg Oral Daily   aspirin EC  81 mg Oral Daily   atorvastatin  80 mg Oral Daily   buPROPion  150 mg Oral Daily   cholecalciferol  2,000 Units Oral Daily   docusate sodium  100 mg Oral BID   FLUoxetine  20 mg Oral Daily   heparin injection (subcutaneous)  5,000 Units Subcutaneous Q8H   insulin aspart  0-5 Units Subcutaneous QHS   insulin aspart  0-9 Units Subcutaneous TID WC   insulin glargine-yfgn  14 Units Subcutaneous BID   latanoprost  1 drop Both Eyes QHS   loratadine  10 mg Oral Daily   metoprolol succinate  25 mg Oral Daily   pantoprazole  40 mg Oral Daily   senna  1 tablet Oral BID   traMADol  50 mg Oral Q12H         EZM:OQHUT from the symptoms mentioned above,there are no other symptoms referable to all systems reviewed.  Physical Exam: Vital signs in last 24 hours: Temp:  [97 F (36.1 C)-98.3 F (36.8 C)] 98 F (36.7 C) (12/04 0429) Pulse Rate:  [79-85] 85 (12/04 0429) Resp:  [12-20] 20 (12/04 0429) BP: (121-177)/(66-141) 169/80 (12/04 0429) SpO2:  [90 %-100 %] 96 % (12/04 0429) Weight change:  Last BM Date: 11/02/21  Intake/Output from previous day: 12/03 0701 - 12/04 0700 In: 730 [P.O.:480; I.V.:250] Out: 50 [Blood:50] No intake/output data recorded.   Physical Exam:  General- pt is awake,alert, oriented to time place and person   Resp- No acute REsp distress, CTA B/L NO Rhonchi   CVS- S1S2 regular in rate and rhythm   GIT- BS+, soft, NT, ND   EXT-trace LE Edema, Cyanosis     Psych- normal mod and affect   Access- PC +  AVF Patient is currently being dialyzed through permacath Patient fistula is not mature enough to use  Lab Results:  CBC  Recent Labs    11/02/21 0416 11/03/21 0418  WBC 9.8 11.7*  HGB 11.9* 11.4*  HCT 37.2* 35.4*  PLT 200 201    BMET  Recent Labs    11/02/21 0416 11/03/21 0418  NA 132* 131*  K 3.8 4.8  CL 96* 95*  CO2 30 27  GLUCOSE 416* 329*  BUN 22 32*  CREATININE 3.76* 4.35*  CALCIUM 8.3* 8.3*      Most recent Creatinine trend  Lab Results  Component Value Date   CREATININE 4.35 (H) 11/03/2021   CREATININE 3.76 (H) 11/02/2021   CREATININE 4.06 (H) 02/16/2021      MICRO   Recent Results (from the past 240 hour(s))  Resp Panel by RT-PCR (Flu A&B, Covid) Nasopharyngeal Swab     Status: None   Collection Time: 11/02/21  5:22 AM   Specimen: Nasopharyngeal Swab; Nasopharyngeal(NP) swabs in vial transport medium  Result Value Ref Range Status   SARS Coronavirus 2 by RT PCR NEGATIVE NEGATIVE Final    Comment: (NOTE) SARS-CoV-2 target nucleic acids are  NOT DETECTED.  The SARS-CoV-2 RNA is generally detectable in upper respiratory specimens during the acute phase of infection. The lowest concentration of SARS-CoV-2 viral copies this assay can detect is 138 copies/mL. A negative result does not preclude SARS-Cov-2 infection and should not be used as the sole basis for treatment or other patient management decisions. A negative result may occur with  improper specimen collection/handling, submission of specimen other than nasopharyngeal swab, presence of viral mutation(s) within the areas targeted by this assay, and inadequate number of viral copies(<138 copies/mL). A negative result must be combined with clinical observations, patient history, and epidemiological information. The expected result is Negative.  Fact Sheet for Patients:  EntrepreneurPulse.com.au  Fact Sheet for Healthcare  Providers:  IncredibleEmployment.be  This test is no t yet approved or cleared by the [place] FDA and  has been authorized for detection and/or diagnosis of SARS-CoV-2 by FDA under an Emergency Use Authorization (EUA). This EUA will remain  in effect (meaning this test can be used) for the duration of the COVID-19 declaration under Section 564(b)(1) of the Act, 21 U.S.C.section 360bbb-3(b)(1), unless the authorization is terminated  or revoked sooner.       Influenza A by PCR NEGATIVE NEGATIVE Final   Influenza B by PCR NEGATIVE NEGATIVE Final    Comment: (NOTE) The Xpert Xpress SARS-CoV-2/FLU/RSV plus assay is intended as an aid in the diagnosis of influenza from Nasopharyngeal swab specimens and should not be used as a sole basis for treatment. Nasal washings and aspirates are unacceptable for Xpert Xpress SARS-CoV-2/FLU/RSV testing.  Fact Sheet for Patients: EntrepreneurPulse.com.au  Fact Sheet for Healthcare Providers: IncredibleEmployment.be  This test is not yet approved or cleared by the [place] FDA and has been authorized for detection and/or diagnosis of SARS-CoV-2 by FDA under an Emergency Use Authorization (EUA). This EUA will remain in effect (meaning this test can be used) for the duration of the COVID-19 declaration under Section 564(b)(1) of the Act, 21 U.S.C. section 360bbb-3(b)(1), unless the authorization is terminated or revoked.  Performed at [facility], [address] [place], [place] [address]          Impression:   Bobby Sampson is a 60 y.o. male with medical history significant of ESRD-HD (MWF), HTN, HLD, DM, stroke with right sided weakness, GERD, depression, peripheral neuropathy, OSA not using CPAP consistently, dCHF, who is admitted with       Closed right hip fracture ([place])   Essential hypertension   Stroke ([place])   Type II diabetes mellitus with renal  manifestations ([place])   Fall   ESRD on dialysis ([place])   Anemia in ESRD (end-stage renal disease) ([place])   GERD (gastroesophageal reflux disease)   Depression   Chronic diastolic CHF (congestive heart failure) ([place])   1)ESRD End-stage renal disease Patient is on hemodialysis Patient is on Monday Wednesday Friday schedule   As an outpatient patient's dialysis orders are- Data Element Value  Order Date/Time September 30, 2021  Frequency 3X Week  Treatment Days MonWedFri  Dialyzer 180NRe Optiflux  Treatment Time (Total Minutes) 240 min  Blood Flow Rate (mL/min) 450 mL/min  Dialysate Flow Rate Manual 800   Estimated Dry Weight 98.4 kg  Dialysate Concentrate 2.0 K, 2.5 Ca, 1.0 Mg, 100 Dextrose (G2251)  Sodium (mEq/L) 137 mEq/L  Bicarb Machine Setting (mEq/L) 35 mEq/L  Dialysis Access Hemodialysis-CV Catheter-Tunneled, Chest, Right Jugular  Access Placed on Apr 18, 2021          Patient does  not need renal replacement therapy today     2)HTN    Blood pressure is stable    3)Anemia of chronic disease  CBC Latest Ref Rng & Units 11/03/2021 11/02/2021 02/16/2021  WBC 4.0 - 10.5 K/uL 11.7(H) 9.8 10.7(H)  Hemoglobin 13.0 - 17.0 g/dL 11.4(L) 11.9(L) 11.4(L)  Hematocrit 39.0 - 52.0 % 35.4(L) 37.2(L) 33.8(L)  Platelets 150 - 400 K/uL 201 200 212       HGb at goal (9--11)   4) Secondary hyperparathyroidism -CKD Mineral-Bone Disorder    Lab Results  Component Value Date   CALCIUM 8.3 (L) 11/03/2021   PHOS 4.6 02/16/2021   Patient has secondary hyperparathyroidism as patient's intact PTH was above 136 as an outpatient     Phosphorus at goal.   5)Right hip fracture Patient is being followed by orthopedics S/P Intramedullary fixation of right basicervical hip fracture  6) Electrolytes   BMP Latest Ref Rng & Units 11/03/2021 11/02/2021 02/16/2021  Glucose 70 - 99 mg/dL 329(H) 416(H) 201(H)  BUN 8 - 23 mg/dL 32(H) 22 68(H)  Creatinine 0.61 - 1.24 mg/dL 4.35(H) 3.76(H)  4.06(H)  Sodium 135 - 145 mmol/L 131(L) 132(L) 138  Potassium 3.5 - 5.1 mmol/L 4.8 3.8 4.1  Chloride 98 - 111 mmol/L 95(L) 96(L) 103  CO2 22 - 32 mmol/L 27 30 26   Calcium 8.9 - 10.3 mg/dL 8.3(L) 8.3(L) 8.9     Sodium hyponatremic   Potassium Normokalemic    7)Acid base   Co2 at goal     Plan:   We will dialyze patient tomorrow Will not give patient any heparin as patient under went orthopedic procedure yesterday      Charlaine Utsey s [facility] 11/03/2021, 9:04 AM

## 2021-11-03 NOTE — Evaluation (Signed)
Physical Therapy Evaluation Patient Details Name: Bobby Sampson MRN: [id number] DOB: 03-16-1961 Today's Date: 11/03/2021  History of Present Illness  Pt is a 60 y/o M admitted on 11/02/21 with c/c of fall & R hip pain. Pt was found to have R basicervical/intertrochanteric hip fracture & underwent IM fixation on 11/02/21 by Dr. Mack Guise. PMH: ESRD on HD MWF, HTN, HLD, DM, stroke with R sided weakness, GERD, depression, peripheral neuropathy, OSA not using CPAP consistently, dCHF  Clinical Impression  Pt seen for PT evaluation with pt confirming home set up & PLOF as already obtained from chart. Pt is able to perform RLE strengthening exercises with instructional cuing for technique. Pt does require encouragement to attempt gait & +2 for chair follow for increased comfort to ambulate longer distances. Pt very fearful & anxious re: mobility with PT educating him on importance of it. Will continue to follow pt acutely to progress mobility as able.       Recommendations for follow up therapy are one component of a multi-disciplinary discharge planning process, led by the attending physician.  Recommendations may be updated based on patient status, additional functional criteria and insurance authorization.  Follow Up Recommendations Home health PT    Assistance Recommended at Discharge Frequent or constant Supervision/Assistance  Functional Status Assessment Patient has had a recent decline in their functional status and demonstrates the ability to make significant improvements in function in a reasonable and predictable amount of time.  Equipment Recommendations  Rolling walker (2 wheels);BSC/3in1    Recommendations for Other Services       Precautions / Restrictions Precautions Precautions: Fall Precaution Comments: RUE dialysis fistula Restrictions Weight Bearing Restrictions: Yes RLE Weight Bearing: Weight bearing as tolerated      Mobility  Bed Mobility               General bed  mobility comments: pt received & left sitting in recliner    Transfers Overall transfer level: Needs assistance   Transfers: Sit to/from Stand Sit to Stand: Min assist;Min guard           General transfer comment: extra time to power up to standing    Ambulation/Gait Ambulation/Gait assistance: Min assist (+2nd person for chair follow for increased gait) Gait Distance (Feet):  (1 step, 2 ft, 15 ft) Assistive device: Rolling walker (2 wheels) Gait Pattern/deviations: Decreased step length - left;Decreased step length - right;Decreased dorsiflexion - left;Decreased dorsiflexion - right;Decreased stride length Gait velocity: decreased     General Gait Details: R knee flexed throughout with PT providing cuing for knee extension in stance phase, cuing/education re: gait pattern & use of RW to decrease weight bearing through RLE. Pt fearful of gait with PT encouraging him throughout.  Stairs            Wheelchair Mobility    Modified Rankin (Stroke Patients Only)       Balance Overall balance assessment: Needs assistance         Standing balance support: During functional activity;Bilateral upper extremity supported;Reliant on assistive device for balance Standing balance-Leahy Scale: Poor                               Pertinent Vitals/Pain Pain Assessment: Faces Faces Pain Scale: Hurts even more Pain Location: 0/10 pain at rest, increases with mobility & weight bearing Pain Descriptors / Indicators: Discomfort;Grimacing Pain Intervention(s): Limited activity within patient's tolerance;Monitored during session (MD notified nurse of pt's  request for pain meds)    Home Living Family/patient expects to be discharged to:: Private residence Living Arrangements: Spouse/significant other;Children Available Help at Discharge: Family Type of Home: House Home Access: [place]: One [employer]: [place] - quad;Cane - single  point;Rollator (4 wheels)      Prior Function Prior Level of Function : Independent/Modified Independent             Mobility Comments: Pt reports he was ambulatory without AD prior to admission, stumbles often but only this 1 fall. Hx of stroke with R side residual weakness.       Hand Dominance        Extremity/Trunk Assessment   Upper Extremity Assessment Upper Extremity Assessment:  (hx of R side residual weakness 2/2 stroke, able to maintain grasp on RW & use RUE in functional context, please see OT evaluation for further details)    Lower Extremity Assessment Lower Extremity Assessment:  (hx of R side residual weakness 2/2 stroke but 3/5 knee extension on this date, not formally tested 2/2 pain)       Communication   Communication: No difficulties  Cognition Arousal/Alertness: Awake/alert Behavior During Therapy: WFL for tasks assessed/performed Overall Cognitive Status: Within Functional Limits for tasks assessed                                 General Comments: Repeatedly states "I don't want to" re: walking with PT strongly encouraging & educating pt on importance of early mobility following sx.        General Comments      Exercises General Exercises - Lower Extremity Ankle Circles/Pumps: AROM;Both;10 reps Quad Sets: AROM;Strengthening;Right;10 reps Long Arc Quad: AROM;Strengthening;Right;10 reps;Seated Heel Slides: AROM;Strengthening;Right;10 reps   Assessment/Plan    PT Assessment Patient needs continued PT services  PT Problem List Decreased balance;Decreased knowledge of use of DME;Pain;Decreased activity tolerance;Decreased knowledge of precautions;Decreased strength;Decreased mobility;Decreased safety awareness       PT Treatment Interventions DME instruction;Therapeutic activities;Modalities;Gait training;Therapeutic exercise;Patient/family education;Balance training;Functional mobility training;Neuromuscular re-education;Manual  techniques    PT Goals (Current goals can be found in the Care Plan section)  Acute Rehab PT Goals Patient Stated Goal: get better PT Goal Formulation: With patient Time For Goal Achievement: 11/17/21 Potential to Achieve Goals: Good    Frequency BID   Barriers to discharge        Co-evaluation               AM-PAC PT "6 Clicks" Mobility  Outcome Measure Help needed turning from your back to your side while in a flat bed without using bedrails?: A Little Help needed moving from lying on your back to sitting on the side of a flat bed without using bedrails?: A Little Help needed moving to and from a bed to a chair (including a wheelchair)?: A Little Help needed standing up from a chair using your arms (e.g., wheelchair or bedside chair)?: A Little Help needed to walk in hospital room?: A Lot Help needed climbing 3-5 steps with a railing? : A Lot 6 Click Score: 16    End of Session Equipment Utilized During Treatment: Gait belt Activity Tolerance: Patient tolerated treatment well (pt requires encouragment, otherwise he is self limiting & fearful) Patient left: in chair (in handoff to OT) Nurse Communication: Mobility status PT Visit Diagnosis: Unsteadiness on feet (R26.81);Muscle weakness (generalized) (M62.81);Difficulty in walking,  not elsewhere classified (R26.2);Pain Pain - Right/Left: Right Pain - part of body: Hip    Time: 0921-0954 PT Time Calculation (min) (ACUTE ONLY): 33 min   Charges:   PT Evaluation $PT Eval Low Complexity: 1 Low PT Treatments $Therapeutic Activity: 23-37 mins        [name], PT, DPT 11/03/21, 11:50 AM   [name] 11/03/2021, 11:49 AM

## 2021-11-03 NOTE — Progress Notes (Addendum)
Subjective:  Patient was seen at approximately 1 PM this afternoon.  At that time he was up out of bed to a chair.  He is POD #1 s/p intramedullary fixation of right mildly angulated, but nondisplaced basicervical femoral neck hip fracture.   Patient reported his right hip pain at that time as mild.  He had no other complaints.  Objective:   VITALS:   Vitals:   11/03/21 0429 11/03/21 1252 11/03/21 1310 11/03/21 1617  BP: (!) 169/80 137/70  116/60  Pulse: 85 78  76  Resp: 20 15  14   Temp: 98 F (36.7 C) (!) 97.5 F (36.4 C)  97.6 F (36.4 C)  TempSrc:      SpO2: 96% 95%  100%  Weight:      Height:   5\' 9"  (1.753 m)     PHYSICAL EXAM: Right lower extremity: Patient is able to actively flex and extend his knee but has weakness in extension that he says predates surgery.  Patient has had a previous stroke affecting his right side.  He states that at times his right leg just gives out on him.  He was able to get up out of the bed and use the walker today.  Patient has palpable pedal pulses, intact sensation to light touch and intact motor function distally.   LABS  Results for orders placed or performed during the hospital encounter of 11/02/21 (from the past 24 hour(s))  Glucose, capillary     Status: Abnormal   Collection Time: 11/02/21  9:39 PM  Result Value Ref Range   Glucose-Capillary 215 (H) 70 - 99 mg/dL   Comment 1 Notify RN   Basic metabolic panel     Status: Abnormal   Collection Time: 11/03/21  4:18 AM  Result Value Ref Range   Sodium 131 (L) 135 - 145 mmol/L   Potassium 4.8 3.5 - 5.1 mmol/L   Chloride 95 (L) 98 - 111 mmol/L   CO2 27 22 - 32 mmol/L   Glucose, Bld 329 (H) 70 - 99 mg/dL   BUN 32 (H) 8 - 23 mg/dL   Creatinine, Ser 4.35 (H) 0.61 - 1.24 mg/dL   Calcium 8.3 (L) 8.9 - 10.3 mg/dL   GFR, Estimated 15 (L) >60 mL/min   Anion gap 9 5 - 15  CBC     Status: Abnormal   Collection Time: 11/03/21  4:18 AM  Result Value Ref Range   WBC 11.7 (H) 4.0 - 10.5  K/uL   RBC 4.24 4.22 - 5.81 MIL/uL   Hemoglobin 11.4 (L) 13.0 - 17.0 g/dL   HCT 35.4 (L) 39.0 - 52.0 %   MCV 83.5 80.0 - 100.0 fL   MCH 26.9 26.0 - 34.0 pg   MCHC 32.2 30.0 - 36.0 g/dL   RDW 14.6 11.5 - 15.5 %   Platelets 201 150 - 400 K/uL   nRBC 0.0 0.0 - 0.2 %  Glucose, capillary     Status: Abnormal   Collection Time: 11/03/21  7:40 AM  Result Value Ref Range   Glucose-Capillary 309 (H) 70 - 99 mg/dL   Comment 1 Notify RN    Comment 2 Document in Chart   Glucose, capillary     Status: Abnormal   Collection Time: 11/03/21 12:23 PM  Result Value Ref Range   Glucose-Capillary 274 (H) 70 - 99 mg/dL  Glucose, capillary     Status: Abnormal   Collection Time: 11/03/21  4:42 PM  Result Value Ref Range  Glucose-Capillary 208 (H) 70 - 99 mg/dL   Comment 1 Notify RN    Comment 2 Document in Chart     DG Chest 1 View  Result Date: 11/02/2021 CLINICAL DATA:  60 year old male status post fall with right hip fracture. EXAM: CHEST  1 VIEW COMPARISON:  Portable chest 11/07/2019. FINDINGS: Portable AP supine view at 0444 hours. Right chest dual lumen dialysis type catheter in place. Normal lung volumes and mediastinal contours. Visualized tracheal air column is within normal limits. Allowing for portable technique the lungs are clear. No acute osseous abnormality identified. Negative visible bowel gas. IMPRESSION: Right chest dialysis type catheter. Otherwise Negative portable chest. Electronically Signed   By: Genevie Ann M.D.   On: 11/02/2021 05:25   CT HIP RIGHT WO CONTRAST  Result Date: 11/02/2021 CLINICAL DATA:  Right hip fracture EXAM: CT OF THE RIGHT HIP WITHOUT CONTRAST 3D ACQUISITION WORKSTATION RECONSTRUCTION TECHNIQUE: Multidetector CT imaging of the right hip was performed according to the standard protocol. Multiplanar CT image reconstructions were also generated. Three-dimensional reconstructions of the right hip and associated fracture were performed on the acquisition workstation.  COMPARISON:  Radiographs 11/02/2021 FINDINGS: Bones/Joint/Cartilage Somewhat vertical fracture through the right femoral neck involves the transcervical region superiorly but extends to the basicervical region inferiorly. The fracture appears to be intra-articular and no true intertrochanteric component is identified. 28 degrees of apex anterior angulation as measured on image 43 series 2. There is also some mild varus angulation and some minimal comminution of the fracture inferiorly. No regional pelvic fracture. Small bone island in the right ischium. Ligaments Suboptimally assessed by CT. Muscles and Tendons Aside from mild indistinctness of fascia planes in the immediate vicinity of the fracture, regional musculotendinous structures appear unremarkable. Trace edema anterior to the right iloipsoas muscle for example on image 33 series 3. Soft tissues Arterial atherosclerosis. Calcification of the vas deferens as can be encountered in the setting of diabetes. Mild chondrocalcinosis in the pubic symphysis. IMPRESSION: 1. Right femoral neck fracture involving the transcervical region superiorly and extending to the basicervical region inferiorly. The fracture appears to be intra-articular without substantial trochanteric component. 20 degrees of apex anterior angulation along with some mild varus angulation. Minimal comminution inferiorly. 2. Atherosclerosis. Electronically Signed   By: Van Clines M.D.   On: 11/02/2021 11:21   CT 3D RECON AT SCANNER  Result Date: 11/02/2021 CLINICAL DATA:  Right hip fracture EXAM: CT OF THE RIGHT HIP WITHOUT CONTRAST 3D ACQUISITION WORKSTATION RECONSTRUCTION TECHNIQUE: Multidetector CT imaging of the right hip was performed according to the standard protocol. Multiplanar CT image reconstructions were also generated. Three-dimensional reconstructions of the right hip and associated fracture were performed on the acquisition workstation. COMPARISON:  Radiographs 11/02/2021  FINDINGS: Bones/Joint/Cartilage Somewhat vertical fracture through the right femoral neck involves the transcervical region superiorly but extends to the basicervical region inferiorly. The fracture appears to be intra-articular and no true intertrochanteric component is identified. 28 degrees of apex anterior angulation as measured on image 43 series 2. There is also some mild varus angulation and some minimal comminution of the fracture inferiorly. No regional pelvic fracture. Small bone island in the right ischium. Ligaments Suboptimally assessed by CT. Muscles and Tendons Aside from mild indistinctness of fascia planes in the immediate vicinity of the fracture, regional musculotendinous structures appear unremarkable. Trace edema anterior to the right iloipsoas muscle for example on image 33 series 3. Soft tissues Arterial atherosclerosis. Calcification of the vas deferens as can be encountered in the setting  of diabetes. Mild chondrocalcinosis in the pubic symphysis. IMPRESSION: 1. Right femoral neck fracture involving the transcervical region superiorly and extending to the basicervical region inferiorly. The fracture appears to be intra-articular without substantial trochanteric component. 20 degrees of apex anterior angulation along with some mild varus angulation. Minimal comminution inferiorly. 2. Atherosclerosis. Electronically Signed   By: Van Clines M.D.   On: 11/02/2021 11:21   DG C-Arm 1-60 Min-No Report  Result Date: 11/02/2021 Fluoroscopy was utilized by the requesting physician.  No radiographic interpretation.   DG C-Arm 1-60 Min-No Report  Result Date: 11/02/2021 Fluoroscopy was utilized by the requesting physician.  No radiographic interpretation.   DG HIP UNILAT WITH PELVIS 2-3 VIEWS RIGHT  Result Date: 11/02/2021 CLINICAL DATA:  Intramedullary nail fixation, femoral neck fracture EXAM: DG HIP (WITH OR WITHOUT PELVIS) 2-3V RIGHT; DG C-ARM 1-60 MIN-NO REPORT COMPARISON:  CT  right hip, 11/02/2021, 10:07 a.m. FLUOROSCOPY TIME:  1:21 FINDINGS: Intraoperative fluoroscopic images of the right hip demonstrate intramedullary nail fixation of transcervical fracture of the right femoral neck. Postoperative radiographs demonstrate near anatomic alignment of fracture with expected overlying change and no perihardware fracture. IMPRESSION: 1. Intraoperative fluoroscopic images of the right hip demonstrate intramedullary nail fixation of transcervical fracture of the right femoral neck. 2. Postoperative radiographs demonstrate near anatomic alignment of fracture with expected overlying change and no perihardware fracture. Electronically Signed   By: Delanna Ahmadi M.D.   On: 11/02/2021 16:25   DG HIP UNILAT WITH PELVIS 2-3 VIEWS RIGHT  Result Date: 11/02/2021 CLINICAL DATA:  Intramedullary nail fixation, femoral neck fracture EXAM: DG HIP (WITH OR WITHOUT PELVIS) 2-3V RIGHT; DG C-ARM 1-60 MIN-NO REPORT COMPARISON:  CT right hip, 11/02/2021, 10:07 a.m. FLUOROSCOPY TIME:  1:21 FINDINGS: Intraoperative fluoroscopic images of the right hip demonstrate intramedullary nail fixation of transcervical fracture of the right femoral neck. Postoperative radiographs demonstrate near anatomic alignment of fracture with expected overlying change and no perihardware fracture. IMPRESSION: 1. Intraoperative fluoroscopic images of the right hip demonstrate intramedullary nail fixation of transcervical fracture of the right femoral neck. 2. Postoperative radiographs demonstrate near anatomic alignment of fracture with expected overlying change and no perihardware fracture. Electronically Signed   By: Delanna Ahmadi M.D.   On: 11/02/2021 16:25   DG Hip Unilat  With Pelvis 2-3 Views Right  Result Date: 11/02/2021 CLINICAL DATA:  60 year old male status post fall landing on right hip. Unable to weightbear. EXAM: DG HIP (WITH OR WITHOUT PELVIS) 2-3V RIGHT COMPARISON:  None. FINDINGS: Femoral heads are normally  located. Mildly impacted fracture of the distal right femoral neck. But possible nondisplaced comminution of the intertrochanteric segment also. Proximal right femoral shaft intact. No superimposed pelvis fracture identified. Grossly intact proximal left femur. Extensive vascular calcifications in and around the pelvis. SI joints appear symmetric. Paucity of bowel gas in the lower abdomen. IMPRESSION: Positive for acute mildly impacted fracture of the proximal right femur. Fractured distal femoral neck, but suspect intertrochanteric involvement also. Electronically Signed   By: Genevie Ann M.D.   On: 11/02/2021 05:24    Assessment/Plan: 1 Day Post-Op   Principal Problem:   Closed right hip fracture (HCC) Active Problems:   Essential hypertension   Stroke (HCC)   Type II diabetes mellitus with renal manifestations ([place])   Fall   ESRD on dialysis ([place])   Anemia in ESRD (end-stage renal disease) ([place])   GERD (gastroesophageal reflux disease)   Depression   Chronic diastolic CHF (congestive heart failure) ([place])  Patient is  stable postop.  I reviewed with him drawing a diagram on the white board in his room the nature of the fracture that I observed during surgery.  He understands that he had a vertical fracture extending obliquely through the femoral neck extending into the basicervical area of the neck medially.  He understands that his fracture was angulated but not displaced.  For this reason the decision was made to reduce the fracture and fix it with an intramedullary rod.  I explained to the patient that there is a potential risk for AVN given his fracture pattern.  He understands that he may require further surgery including total hip arthroplasty if he experiences osteoarthritis, avascular necrosis or nonunion.  Patient understood this and I answered all his questions.  Patient is not having any significant right hip pain and is making good progress with physical therapy.    [place] ,  MD 11/03/2021, 4:50 PM

## 2021-11-03 NOTE — Progress Notes (Signed)
Pt refusing Heparin sub-q. Pt educated on the benefits/risks.  MD notified.

## 2021-11-03 NOTE — TOC Initial Note (Signed)
Transition of Care [facility]) - Initial/Assessment Note    Patient Details  Name: Bobby Sampson MRN: [id number] Date of Birth: [birth year]  [facility] of [facility]) CM/SW Contact:    Pete Pelt, RN Phone Number: 11/03/2021, 4:20 PM  Clinical Narrative:      Patient lives at home with wife and children.  Wife is able to assist when/if needed.  Patient has transportation to appointments and no pharmacy concerns at this time.  Patient is Monday, Wednesday and Friday dialysis.   Patient accepts home health PT/OT requests nursing assistant as well.       [facility] notified, awaiting response.  Patient has a rolling walker at home, requests that 3 n 1 be delivered to his home.  Jasmine from [facility] notified.  Patient also states he is planning to speak with MD in the morning about the possibility of getting a hospital bed.    TOC contact information provided, TOC to follow to discharge.      Expected Discharge Plan: [place] Barriers to Discharge: Continued Medical Work up   Patient Goals and CMS Choice Patient states their goals for this hospitalization and ongoing recovery are:: To be as strong as possible      Expected Discharge Plan and Services Expected Discharge Plan: [place]   Discharge Planning Services: CM Consult Post Acute Care Choice: Durable Medical Equipment, Home Health Living arrangements for the past 2 months: Single Family Home                 DME Arranged: 3-N-1 DME Agency: [employer] Date DME Agency Contacted: 11/03/21 Time DME Agency Contacted: [phone] Representative spoke with at DME Agency: jasmine HH Arranged: PT, OT, Nurse's Aide          Prior Living Arrangements/Services Living arrangements for the past 2 months: Single Family Home Lives with:: Self, Spouse Patient language and need for interpreter reviewed:: Yes Do you feel safe going back to the place where you live?: Yes      Need for Family  Participation in Patient Care: Yes (Comment) Care giver support system in place?: Yes (comment) Current home services: DME Criminal Activity/Legal Involvement Pertinent to Current Situation/Hospitalization: No - Comment as needed  Activities of Daily Living Home Assistive Devices/Equipment: None ADL Screening (condition at time of admission) Patient's cognitive ability adequate to safely complete daily activities?: No Is the patient deaf or have difficulty hearing?: No Does the patient have difficulty seeing, even when wearing glasses/contacts?: No Does the patient have difficulty concentrating, remembering, or making decisions?: No Patient able to express need for assistance with ADLs?: No Does the patient have difficulty dressing or bathing?: No Independently performs ADLs?: Yes (appropriate for developmental age) Does the patient have difficulty walking or climbing stairs?: Yes Weakness of Legs: Both Weakness of Arms/Hands: None  Permission Sought/Granted Permission sought to share information with : Case Manager Permission granted to share information with : Yes, Verbal Permission Granted     Permission granted to share info w AGENCY: [facility]pective home health agencies.        Emotional Assessment Appearance:: Appears stated age Attitude/Demeanor/Rapport: Gracious, Engaged Affect (typically observed): Pleasant, Appropriate Orientation: : Oriented to Self, Oriented to Place, Oriented to  Time, Oriented to Situation Alcohol / Substance Use: Not Applicable Psych Involvement: No (comment)  Admission diagnosis:  Fall [W19.XXXA] Hyperglycemia [R73.9] Closed right hip fracture ([place]) [S72.001A] Closed fracture of right hip, initial encounter [facility]) [S72.001A] Patient Active Problem List  Diagnosis Date Noted   Closed right hip fracture ([place]) 11/02/2021   Type II diabetes mellitus with renal manifestations ([place]) 11/02/2021   Fall 11/02/2021   ESRD on dialysis ([place]) 11/02/2021    Anemia in ESRD (end-stage renal disease) ([place]) 11/02/2021   GERD (gastroesophageal reflux disease) 11/02/2021   Depression 11/02/2021   Chronic diastolic CHF (congestive heart failure) ([place]) 11/02/2021   Stroke ([place]) 02/16/2021   CKD (chronic kidney disease), stage IV (HCC)    Sleep apnea    Obesity (BMI 30.0-34.9)    Acute CVA (cerebrovascular accident) ([place]) 02/15/2021   Essential hypertension    Type 2 diabetes mellitus with hyperglycemia, with long-term current use of insulin ([place])    AKI (acute kidney injury) ([place])    COVID-19 11/07/2019   PCP:  [place]:   [facility] [address] [place] [address] [place]) [place] [address] Phone: [phone] Fax: [phone]     Social Determinants of Health (SDOH) Interventions    Readmission Risk Interventions No flowsheet data found.

## 2021-11-03 NOTE — Plan of Care (Signed)
A/Ox4, tolerated nighttime fine, pain was controlled with scheduled Tylenol and PRN Oxy x1 dose. This a.m. pt up in chair, tolerated fairly but pain was exacerbated with the position change.    Problem: Education: Goal: Knowledge of General Education information will improve Description: Including pain rating scale, medication(s)/side effects and non-pharmacologic comfort measures Outcome: Progressing   Problem: Health Behavior/Discharge Planning: Goal: Ability to manage health-related needs will improve Outcome: Progressing   Problem: Clinical Measurements: Goal: Ability to maintain clinical measurements within normal limits will improve Outcome: Progressing Goal: Will remain free from infection Outcome: Progressing Goal: Diagnostic test results will improve Outcome: Progressing Goal: Respiratory complications will improve Outcome: Progressing Goal: Cardiovascular complication will be avoided Outcome: Progressing   Problem: Activity: Goal: Risk for activity intolerance will decrease Outcome: Progressing   Problem: Nutrition: Goal: Adequate nutrition will be maintained Outcome: Progressing   Problem: Coping: Goal: Level of anxiety will decrease Outcome: Progressing   Problem: Elimination: Goal: Will not experience complications related to bowel motility Outcome: Progressing Goal: Will not experience complications related to urinary retention Outcome: Progressing   Problem: Pain Managment: Goal: General experience of comfort will improve Outcome: Progressing   Problem: Safety: Goal: Ability to remain free from injury will improve Outcome: Progressing   Problem: Skin Integrity: Goal: Risk for impaired skin integrity will decrease Outcome: Progressing

## 2021-11-03 NOTE — Evaluation (Signed)
Occupational Therapy Evaluation Patient Details Name: Bobby Sampson MRN: [id number] DOB: January 19, 1961 Today's Date: 11/03/2021   History of Present Illness Pt is a 60 y/o M admitted on 11/02/21 with c/c of fall & R hip pain. Pt was found to have R basicervical/intertrochanteric hip fracture & underwent IM fixation on 11/02/21 by Dr. Mack Guise. PMH: ESRD on HD MWF, HTN, HLD, DM, stroke with R sided weakness, GERD, depression, peripheral neuropathy, OSA not using CPAP consistently, dCHF   Clinical Impression   Pt seen for OT evaluation this date. Functional mobility portion of session coordinated with PT to maximize safety during ambulation (+2 for chair follow only). Prior to admission, pt was independent with ADLs and functional mobility (has access to AD but does not use) and was living in a 1-level home with family. Pt currently requires MIN GUARD for sit<>stand transfers, MIN A for functional mobility of short household distances (66ft) with RW (+2 for chair follow), MIN GUARD for x3 standing grooming tasks, and MIN A for standing UB bathing d/t RLE pain, decreased balance, and decreased pain. Pt would benefit from additional skilled OT services to maximize return to PLOF and minimize risk of future falls, injury, caregiver burden, and readmission. Upon discharge, recommend [facility] services.       Recommendations for follow up therapy are one component of a multi-disciplinary discharge planning process, led by the attending physician.  Recommendations may be updated based on patient status, additional functional criteria and insurance authorization.   Follow Up Recommendations  Home health OT    Assistance Recommended at Discharge Frequent or constant Supervision/Assistance  Functional Status Assessment  Patient has had a recent decline in their functional status and demonstrates the ability to make significant improvements in function in a reasonable and predictable amount of time.  Equipment  Recommendations  BSC/3in1;Other (comment) (2ww)       Precautions / Restrictions Precautions Precautions: Fall Precaution Comments: RUE dialysis fistula Restrictions Weight Bearing Restrictions: Yes RLE Weight Bearing: Weight bearing as tolerated      Mobility Bed Mobility               General bed mobility comments: pt received & left sitting in recliner    Transfers Overall transfer level: Needs assistance Equipment used: Rolling walker (2 wheels) Transfers: Sit to/from Stand Sit to Stand: Min guard           General transfer comment: x3 bouts; requiring increased time/effort      Balance Overall balance assessment: Needs assistance Sitting-balance support: Feet supported;No upper extremity supported Sitting balance-Leahy Scale: Good Sitting balance - Comments: good sitting balance reaching within BOS   Standing balance support: No upper extremity supported;During functional activity Standing balance-Leahy Scale: Poor Standing balance comment: Requires MIN GUARD-MIN A for standing grooming tasks                           ADL either performed or assessed with clinical judgement   ADL Overall ADL's : Needs assistance/impaired     Grooming: Wash/dry hands;Wash/dry face;Oral care;Min guard;Standing Grooming Details (indicate cue type and reason): requires MIN GUARD d/t pt reporting R knee buckling when fatigued Upper Body Bathing: Minimal assistance;Standing Upper Body Bathing Details (indicate cue type and reason): MIN A for steadying                         Functional mobility during ADLs: Minimal assistance;+2 for safety/equipment;Rolling walker (2 wheels) (to  walk 15 ft with RW. +2 for chair follow 2/2 R knee buckling when fatigue and pt reporting anxiety with OOB mobility)       Vision Ability to See in Adequate Light: 0 Adequate Patient Visual Report: No change from baseline              Pertinent Vitals/Pain Pain  Assessment: Faces Faces Pain Scale: Hurts even more Pain Location: 0/10 pain at rest, increases with mobility & weight bearing Pain Descriptors / Indicators: Discomfort;Grimacing Pain Intervention(s): Limited activity within patient's tolerance;Monitored during session (MD notified nurse of pt's request for pain meds)        Extremity/Trunk Assessment Upper Extremity Assessment Upper Extremity Assessment: Overall WFL for tasks assessed (hx of R side residual weakness 2/2 stroke, but able to maintain grasp on RW & use RUE during standing grooming tasks)   Lower Extremity Assessment Lower Extremity Assessment: Defer to PT evaluation       Communication Communication Communication: No difficulties   Cognition Arousal/Alertness: Awake/alert Behavior During Therapy: WFL for tasks assessed/performed Overall Cognitive Status: Within Functional Limits for tasks assessed                                 General Comments: Repeatedly states "I don't want to" re: walking with PT strongly encouraging & educating pt on importance of early mobility following sx.                Home Living Family/patient expects to be discharged to:: Private residence Living Arrangements: Spouse/significant other;Children Available Help at Discharge: Family Type of Home: House Home Access: [place]: One level     Bathroom Shower/Tub: Tub/shower unit         [place]: [place] - single point;Rollator (4 wheels);Tub bench          Prior Functioning/Environment Prior Level of Function : Independent/Modified Independent             Mobility Comments: Pt reports he was ambulatory without AD prior to admission, stumbles often but only this 1 fall. Hx of stroke with R side residual weakness. ADLs Comments: Pt reports he was independent with ADLs prior to admission (has access to AD for ADLs, but does not use). Pt is a former Engineering geologist runner        OT Problem List: Decreased strength;Decreased activity tolerance;Impaired balance (sitting and/or standing);Pain      OT Treatment/Interventions: Self-care/ADL training;Therapeutic exercise;DME and/or AE instruction;Therapeutic activities;Patient/family education;Balance training    OT Goals(Current goals can be found in the care plan section) Acute Rehab OT Goals Patient Stated Goal: to regain independence OT Goal Formulation: With patient Time For Goal Achievement: 11/17/21 Potential to Achieve Goals: Good ADL Goals Pt Will Perform Grooming: with set-up;standing;with supervision Pt Will Perform Lower Body Dressing: with supervision;sit to/from stand;with adaptive equipment Pt Will Transfer to Toilet: with supervision;ambulating;bedside commode  OT Frequency: Min 3X/week    AM-PAC OT "6 Clicks" Daily Activity     Outcome Measure Help from another person eating meals?: None Help from another person taking care of personal grooming?: A Little Help from another person toileting, which includes using toliet, bedpan, or urinal?: A Little Help from another person bathing (including washing, rinsing, drying)?: A Lot Help from another person to put on and taking off regular upper body clothing?: A Little Help from another person to put on and taking  off regular lower body clothing?: A Lot 6 Click Score: 17   End of Session Equipment Utilized During Treatment: Gait belt;Rolling walker (2 wheels) Nurse Communication: Mobility status  Activity Tolerance: Patient tolerated treatment well Patient left: in chair;with call bell/phone within reach;with chair alarm set  OT Visit Diagnosis: Unsteadiness on feet (R26.81);Muscle weakness (generalized) (M62.81);History of falling (Z91.81)                Time: 1031-5945 OT Time Calculation (min): 25 min Charges:  OT General Charges $OT Visit: 1 Visit OT Evaluation $OT Eval Moderate Complexity: [place]  [place], OTR/L [place]

## 2021-11-03 NOTE — Progress Notes (Signed)
PROGRESS NOTE    Bobby Sampson  OJJ:[id number] DOB: 04/22/1961 DOA: 11/02/2021 PCP: [facility]    Assessment & Plan:   Principal Problem:   Closed right hip fracture [facility]) Active Problems:   Essential hypertension   Stroke ([place])   Type II diabetes mellitus with renal manifestations ([place])   Fall   ESRD on dialysis ([place])   Anemia in ESRD (end-stage renal disease) ([place])   GERD (gastroesophageal reflux disease)   Depression   Chronic diastolic CHF (congestive heart failure) ([facility])   Closed right hip fracture: s/p intramedullary fixation of right basicervical hip fracture. Percocet, morphine & robaxin prn. PT/OT consulted    HTN: continue on metoprolol, amlodipine. IV hydralazine prn    Hx of CVA: continue on aspirin, statin   DM2:  HbA1c 11.0, poorly controlled. Continue on lantus, SSI w/ accuchecks    Fall: PT/OT consulted   ESRD: on HD MWF. Management as per nephro   Hyponatremia: will be managed w/ HD   ACD: likely secondary to ESRD. No need for a transfusion currently    GERD: continue on PPI    Depression: severity unknown. Continue on home dose of fluoxetine, bupropion   Chronic diastolic CHF: echo on 9/37/1696 showed EF > 55% with grade 1 diastolic dysfunction. CHF seem to be compensated. Volume management w/ HD.    DVT prophylaxis: heparin  Code Status: full Family Communication:  Disposition Plan: likely d/c back home   Level of care: Med-Surg  Status is: Inpatient  Remains inpatient appropriate because: severity of illness      Consultants:  Ortho surg   Procedures:   Antimicrobials:   Subjective: Pt c/o right hip pain  Objective: Vitals:   11/02/21 1803 11/02/21 2104 11/03/21 0034 11/03/21 0429  BP: (!) 155/141 139/77 (!) 153/75 (!) 169/80  Pulse: 79 83 84 85  Resp:  20  20  Temp:  98.3 F (36.8 C) 97.6 F (36.4 C) 98 F (36.7 C)  TempSrc:   Oral   SpO2: 100% 90% 95% 96%  Weight:        Intake/Output Summary  (Last 24 hours) at 11/03/2021 0811 Last data filed at 11/03/2021 0500 Gross per 24 hour  Intake 730 ml  Output 50 ml  Net 680 ml   Filed Weights   11/02/21 0405  Weight: 99 kg    Examination:  General exam: Appears calm and comfortable  Respiratory system: Clear to auscultation. Respiratory effort normal. Cardiovascular system: S1 & S2+. No rubs, gallops or clicks.  Gastrointestinal system: Abdomen is nondistended, soft and nontender. Normal bowel sounds heard. Central nervous system: Alert and oriented. Moves all extremities  Psychiatry: Judgement and insight appear normal. Mood & affect appropriate.     Data Reviewed: I have personally reviewed following labs and imaging studies  CBC: Recent Labs  Lab 11/02/21 0416 11/03/21 0418  WBC 9.8 11.7*  HGB 11.9* 11.4*  HCT 37.2* 35.4*  MCV 83.0 83.5  PLT 200 789   Basic Metabolic Panel: Recent Labs  Lab 11/02/21 0416 11/03/21 0418  NA 132* 131*  K 3.8 4.8  CL 96* 95*  CO2 30 27  GLUCOSE 416* 329*  BUN 22 32*  CREATININE 3.76* 4.35*  CALCIUM 8.3* 8.3*   GFR: CrCl cannot be calculated (Unknown ideal weight.). Liver Function Tests: No results for input(s): AST, ALT, ALKPHOS, BILITOT, PROT, ALBUMIN in the last 168 hours. No results for input(s): LIPASE, AMYLASE in the last 168 hours. No results for input(s): AMMONIA  in the last 168 hours. Coagulation Profile: Recent Labs  Lab 11/02/21 0916  INR 1.1   Cardiac Enzymes: No results for input(s): CKTOTAL, CKMB, CKMBINDEX, TROPONINI in the last 168 hours. BNP (last 3 results) No results for input(s): PROBNP in the last 8760 hours. HbA1C: No results for input(s): HGBA1C in the last 72 hours. CBG: Recent Labs  Lab 11/02/21 0811 11/02/21 1148 11/02/21 1529 11/02/21 2139 11/03/21 0740  GLUCAP 272* 172* 137* 215* 309*   Lipid Profile: No results for input(s): CHOL, HDL, LDLCALC, TRIG, CHOLHDL, LDLDIRECT in the last 72 hours. Thyroid Function Tests: No results  for input(s): TSH, T4TOTAL, FREET4, T3FREE, THYROIDAB in the last 72 hours. Anemia Panel: No results for input(s): VITAMINB12, FOLATE, FERRITIN, TIBC, IRON, RETICCTPCT in the last 72 hours. Sepsis Labs: No results for input(s): PROCALCITON, LATICACIDVEN in the last 168 hours.  Recent Results (from the past 240 hour(s))  Resp Panel by RT-PCR (Flu A&B, Covid) Nasopharyngeal Swab     Status: None   Collection Time: 11/02/21  5:22 AM   Specimen: Nasopharyngeal Swab; Nasopharyngeal(NP) swabs in vial transport medium  Result Value Ref Range Status   SARS Coronavirus 2 by RT PCR NEGATIVE NEGATIVE Final    Comment: (NOTE) SARS-CoV-2 target nucleic acids are NOT DETECTED.  The SARS-CoV-2 RNA is generally detectable in upper respiratory specimens during the acute phase of infection. The lowest concentration of SARS-CoV-2 viral copies this assay can detect is 138 copies/mL. A negative result does not preclude SARS-Cov-2 infection and should not be used as the sole basis for treatment or other patient management decisions. A negative result may occur with  improper specimen collection/handling, submission of specimen other than nasopharyngeal swab, presence of viral mutation(s) within the areas targeted by this assay, and inadequate number of viral copies(<138 copies/mL). A negative result must be combined with clinical observations, patient history, and epidemiological information. The expected result is Negative.  Fact Sheet for Patients:  EntrepreneurPulse.com.au  Fact Sheet for Healthcare Providers:  IncredibleEmployment.be  This test is no t yet approved or cleared by the [place] FDA and  has been authorized for detection and/or diagnosis of SARS-CoV-2 by FDA under an Emergency Use Authorization (EUA). This EUA will remain  in effect (meaning this test can be used) for the duration of the COVID-19 declaration under Section 564(b)(1) of the Act,  21 U.S.C.section 360bbb-3(b)(1), unless the authorization is terminated  or revoked sooner.       Influenza A by PCR NEGATIVE NEGATIVE Final   Influenza B by PCR NEGATIVE NEGATIVE Final    Comment: (NOTE) The Xpert Xpress SARS-CoV-2/FLU/RSV plus assay is intended as an aid in the diagnosis of influenza from Nasopharyngeal swab specimens and should not be used as a sole basis for treatment. Nasal washings and aspirates are unacceptable for Xpert Xpress SARS-CoV-2/FLU/RSV testing.  Fact Sheet for Patients: EntrepreneurPulse.com.au  Fact Sheet for Healthcare Providers: IncredibleEmployment.be  This test is not yet approved or cleared by the [place] FDA and has been authorized for detection and/or diagnosis of SARS-CoV-2 by FDA under an Emergency Use Authorization (EUA). This EUA will remain in effect (meaning this test can be used) for the duration of the COVID-19 declaration under Section 564(b)(1) of the Act, 21 U.S.C. section 360bbb-3(b)(1), unless the authorization is terminated or revoked.  Performed at [facility], [address], [place], [place] [address]          Radiology Studies: DG Chest 1 View  Result Date: 11/02/2021 CLINICAL DATA:  59 year old male status post fall with right hip fracture. EXAM: CHEST  1 VIEW COMPARISON:  Portable chest 11/07/2019. FINDINGS: Portable AP supine view at 0444 hours. Right chest dual lumen dialysis type catheter in place. Normal lung volumes and mediastinal contours. Visualized tracheal air column is within normal limits. Allowing for portable technique the lungs are clear. No acute osseous abnormality identified. Negative visible bowel gas. IMPRESSION: Right chest dialysis type catheter. Otherwise Negative portable chest. Electronically Signed   By: Genevie Ann M.D.   On: 11/02/2021 05:25   CT HIP RIGHT WO CONTRAST  Result Date: 11/02/2021 CLINICAL DATA:  Right hip fracture EXAM: CT  OF THE RIGHT HIP WITHOUT CONTRAST 3D ACQUISITION WORKSTATION RECONSTRUCTION TECHNIQUE: Multidetector CT imaging of the right hip was performed according to the standard protocol. Multiplanar CT image reconstructions were also generated. Three-dimensional reconstructions of the right hip and associated fracture were performed on the acquisition workstation. COMPARISON:  Radiographs 11/02/2021 FINDINGS: Bones/Joint/Cartilage Somewhat vertical fracture through the right femoral neck involves the transcervical region superiorly but extends to the basicervical region inferiorly. The fracture appears to be intra-articular and no true intertrochanteric component is identified. 28 degrees of apex anterior angulation as measured on image 43 series 2. There is also some mild varus angulation and some minimal comminution of the fracture inferiorly. No regional pelvic fracture. Small bone island in the right ischium. Ligaments Suboptimally assessed by CT. Muscles and Tendons Aside from mild indistinctness of fascia planes in the immediate vicinity of the fracture, regional musculotendinous structures appear unremarkable. Trace edema anterior to the right iloipsoas muscle for example on image 33 series 3. Soft tissues Arterial atherosclerosis. Calcification of the vas deferens as can be encountered in the setting of diabetes. Mild chondrocalcinosis in the pubic symphysis. IMPRESSION: 1. Right femoral neck fracture involving the transcervical region superiorly and extending to the basicervical region inferiorly. The fracture appears to be intra-articular without substantial trochanteric component. 20 degrees of apex anterior angulation along with some mild varus angulation. Minimal comminution inferiorly. 2. Atherosclerosis. Electronically Signed   By: Van Clines M.D.   On: 11/02/2021 11:21   CT 3D RECON AT SCANNER  Result Date: 11/02/2021 CLINICAL DATA:  Right hip fracture EXAM: CT OF THE RIGHT HIP WITHOUT CONTRAST  3D ACQUISITION WORKSTATION RECONSTRUCTION TECHNIQUE: Multidetector CT imaging of the right hip was performed according to the standard protocol. Multiplanar CT image reconstructions were also generated. Three-dimensional reconstructions of the right hip and associated fracture were performed on the acquisition workstation. COMPARISON:  Radiographs 11/02/2021 FINDINGS: Bones/Joint/Cartilage Somewhat vertical fracture through the right femoral neck involves the transcervical region superiorly but extends to the basicervical region inferiorly. The fracture appears to be intra-articular and no true intertrochanteric component is identified. 28 degrees of apex anterior angulation as measured on image 43 series 2. There is also some mild varus angulation and some minimal comminution of the fracture inferiorly. No regional pelvic fracture. Small bone island in the right ischium. Ligaments Suboptimally assessed by CT. Muscles and Tendons Aside from mild indistinctness of fascia planes in the immediate vicinity of the fracture, regional musculotendinous structures appear unremarkable. Trace edema anterior to the right iloipsoas muscle for example on image 33 series 3. Soft tissues Arterial atherosclerosis. Calcification of the vas deferens as can be encountered in the setting of diabetes. Mild chondrocalcinosis in the pubic symphysis. IMPRESSION: 1. Right femoral neck fracture involving the transcervical region superiorly and extending to the basicervical region inferiorly. The fracture appears to be intra-articular without substantial trochanteric component.  20 degrees of apex anterior angulation along with some mild varus angulation. Minimal comminution inferiorly. 2. Atherosclerosis. Electronically Signed   By: Van Clines M.D.   On: 11/02/2021 11:21   DG C-Arm 1-60 Min-No Report  Result Date: 11/02/2021 Fluoroscopy was utilized by the requesting physician.  No radiographic interpretation.   DG C-Arm 1-60  Min-No Report  Result Date: 11/02/2021 Fluoroscopy was utilized by the requesting physician.  No radiographic interpretation.   DG HIP UNILAT WITH PELVIS 2-3 VIEWS RIGHT  Result Date: 11/02/2021 CLINICAL DATA:  Intramedullary nail fixation, femoral neck fracture EXAM: DG HIP (WITH OR WITHOUT PELVIS) 2-3V RIGHT; DG C-ARM 1-60 MIN-NO REPORT COMPARISON:  CT right hip, 11/02/2021, 10:07 a.m. FLUOROSCOPY TIME:  1:21 FINDINGS: Intraoperative fluoroscopic images of the right hip demonstrate intramedullary nail fixation of transcervical fracture of the right femoral neck. Postoperative radiographs demonstrate near anatomic alignment of fracture with expected overlying change and no perihardware fracture. IMPRESSION: 1. Intraoperative fluoroscopic images of the right hip demonstrate intramedullary nail fixation of transcervical fracture of the right femoral neck. 2. Postoperative radiographs demonstrate near anatomic alignment of fracture with expected overlying change and no perihardware fracture. Electronically Signed   By: Delanna Ahmadi M.D.   On: 11/02/2021 16:25   DG HIP UNILAT WITH PELVIS 2-3 VIEWS RIGHT  Result Date: 11/02/2021 CLINICAL DATA:  Intramedullary nail fixation, femoral neck fracture EXAM: DG HIP (WITH OR WITHOUT PELVIS) 2-3V RIGHT; DG C-ARM 1-60 MIN-NO REPORT COMPARISON:  CT right hip, 11/02/2021, 10:07 a.m. FLUOROSCOPY TIME:  1:21 FINDINGS: Intraoperative fluoroscopic images of the right hip demonstrate intramedullary nail fixation of transcervical fracture of the right femoral neck. Postoperative radiographs demonstrate near anatomic alignment of fracture with expected overlying change and no perihardware fracture. IMPRESSION: 1. Intraoperative fluoroscopic images of the right hip demonstrate intramedullary nail fixation of transcervical fracture of the right femoral neck. 2. Postoperative radiographs demonstrate near anatomic alignment of fracture with expected overlying change and no  perihardware fracture. Electronically Signed   By: Delanna Ahmadi M.D.   On: 11/02/2021 16:25   DG Hip Unilat  With Pelvis 2-3 Views Right  Result Date: 11/02/2021 CLINICAL DATA:  60 year old male status post fall landing on right hip. Unable to weightbear. EXAM: DG HIP (WITH OR WITHOUT PELVIS) 2-3V RIGHT COMPARISON:  None. FINDINGS: Femoral heads are normally located. Mildly impacted fracture of the distal right femoral neck. But possible nondisplaced comminution of the intertrochanteric segment also. Proximal right femoral shaft intact. No superimposed pelvis fracture identified. Grossly intact proximal left femur. Extensive vascular calcifications in and around the pelvis. SI joints appear symmetric. Paucity of bowel gas in the lower abdomen. IMPRESSION: Positive for acute mildly impacted fracture of the proximal right femur. Fractured distal femoral neck, but suspect intertrochanteric involvement also. Electronically Signed   By: Genevie Ann M.D.   On: 11/02/2021 05:24        Scheduled Meds:  acetaminophen  1,000 mg Oral Q6H   amLODipine  2.5 mg Oral Daily   aspirin EC  81 mg Oral Daily   atorvastatin  80 mg Oral Daily   buPROPion  150 mg Oral Daily   cholecalciferol  2,000 Units Oral Daily   docusate sodium  100 mg Oral BID   FLUoxetine  20 mg Oral Daily   heparin injection (subcutaneous)  5,000 Units Subcutaneous Q8H   insulin aspart  0-5 Units Subcutaneous QHS   insulin aspart  0-9 Units Subcutaneous TID WC   insulin glargine-yfgn  14 Units Subcutaneous BID  latanoprost  1 drop Both Eyes QHS   loratadine  10 mg Oral Daily   metoprolol succinate  25 mg Oral Daily   pantoprazole  40 mg Oral Daily   senna  1 tablet Oral BID   traMADol  50 mg Oral Q12H   Continuous Infusions:  methocarbamol (ROBAXIN) IV       LOS: 1 day    Time spent: 33 mins    Wyvonnia Dusky, MD [facility] Pager [phone]  If 7PM-7AM, please contact night-coverage 11/03/2021, 8:11 AM

## 2021-11-03 NOTE — Progress Notes (Signed)
Initial Nutrition Assessment  DOCUMENTATION CODES:   Obesity unspecified  INTERVENTION:   Nepro Shake po TID, each supplement provides 425 kcal and 19 grams protein  Rena-vit po daily   MVI po daily   NUTRITION DIAGNOSIS:   Increased nutrient needs related to hip fracture as evidenced by estimated needs.  GOAL:   Patient will meet greater than or equal to 90% of their needs  MONITOR:   PO intake, Supplement acceptance, Labs, Weight trends, Skin, I & O's  REASON FOR ASSESSMENT:   Consult Hip fracture protocol  ASSESSMENT:   60 y.o. male with medical history significant of ESRD-HD (MWF), HTN, HLD, DM, stroke with right sided weakness, GERD, depression, peripheral neuropathy, OSA not using CPAP consistently and dCHF who is admitted with hip fracture after fall now s/p surgical repair 12/3.  RD working remotely.  Spoke with pt via phone. Pt reports good appetite and oral intake pta and in hospital. Pt reports eating 50% of an omelet for breakfast this morning and reports that he has ordered a cheeseburger for lunch. RD discussed with pt the importance of adequate nutrition needed to support post op healing and to preserve lean muscle. Pt is willing to drink vanilla Nepro in hospital. RD will add supplements and vitamins to help pt meet his estimated needs. Per chart, pt appears to be down 8lbs(4%) since September; this is not significant.    Medications reviewed and include: aspirin, vitamin D, colace, heparin, insulin, protonix, senokot  Labs reviewed: Na 131(L), BUN 32(H), creat 4.35(H) Wbc- 11.7(H)  NUTRITION - FOCUSED PHYSICAL EXAM: Unable to perform at this time   Diet Order:   Diet Order             Diet renal/carb modified with fluid restriction Diet-HS Snack? Nothing; Fluid restriction: 1200 mL Fluid; Room service appropriate? Yes; Fluid consistency: Thin  Diet effective now                  EDUCATION NEEDS:   Education needs have been  addressed  Skin:  Skin Assessment: Reviewed RN Assessment (incision R hip)  Last BM:  12/3  Height:   Ht Readings from Last 1 Encounters:  11/03/21 5\' 9"  (1.753 m)    Weight:   Wt Readings from Last 1 Encounters:  11/02/21 99 kg    Ideal Body Weight:  72.7 kg  BMI:  Body mass index is 32.23 kg/m.  Estimated Nutritional Needs:   Kcal:  2200-2500kcal/day  Protein:  110-125g/day  Fluid:  UOP +1L  Koleen Distance MS, RD, LDN Please refer to [facility] for RD and/or RD on-call/weekend/after hours pager

## 2021-11-04 ENCOUNTER — Encounter: Payer: Self-pay | Admitting: Orthopedic Surgery

## 2021-11-04 DIAGNOSIS — Z992 Dependence on renal dialysis: Secondary | ICD-10-CM | POA: Diagnosis not present

## 2021-11-04 DIAGNOSIS — E1122 Type 2 diabetes mellitus with diabetic chronic kidney disease: Secondary | ICD-10-CM | POA: Diagnosis not present

## 2021-11-04 DIAGNOSIS — N186 End stage renal disease: Secondary | ICD-10-CM | POA: Diagnosis not present

## 2021-11-04 DIAGNOSIS — S72001A Fracture of unspecified part of neck of right femur, initial encounter for closed fracture: Secondary | ICD-10-CM | POA: Diagnosis not present

## 2021-11-04 LAB — BASIC METABOLIC PANEL
Anion gap: 9 (ref 5–15)
BUN: 50 mg/dL — ABNORMAL HIGH (ref 8–23)
CO2: 28 mmol/L (ref 22–32)
Calcium: 8.9 mg/dL (ref 8.9–10.3)
Chloride: 99 mmol/L (ref 98–111)
Creatinine, Ser: 5.52 mg/dL — ABNORMAL HIGH (ref 0.61–1.24)
GFR, Estimated: 11 mL/min — ABNORMAL LOW (ref >60)
Glucose, Bld: 237 mg/dL — ABNORMAL HIGH (ref 70–99)
Potassium: 4.2 mmol/L (ref 3.5–5.1)
Sodium: 136 mmol/L (ref 135–145)

## 2021-11-04 LAB — GLUCOSE, CAPILLARY
Glucose-Capillary: 125 mg/dL — ABNORMAL HIGH (ref 70–99)
Glucose-Capillary: 216 mg/dL — ABNORMAL HIGH (ref 70–99)
Glucose-Capillary: 143 mg/dL — ABNORMAL HIGH (ref 70–99)
Glucose-Capillary: 211 mg/dL — ABNORMAL HIGH (ref 70–99)
Glucose-Capillary: 314 mg/dL — ABNORMAL HIGH (ref 70–99)

## 2021-11-04 LAB — CBC
HCT: 33.9 % — ABNORMAL LOW (ref 39.0–52.0)
Hemoglobin: 10.9 g/dL — ABNORMAL LOW (ref 13.0–17.0)
MCH: 26.8 pg (ref 26.0–34.0)
MCHC: 32.2 g/dL (ref 30.0–36.0)
MCV: 83.3 fL (ref 80.0–100.0)
Platelets: 210 10*3/uL (ref 150–400)
RBC: 4.07 MIL/uL — ABNORMAL LOW (ref 4.22–5.81)
RDW: 14.8 % (ref 11.5–15.5)
WBC: 10 10*3/uL (ref 4.0–10.5)
nRBC: 0 % (ref 0.0–0.2)

## 2021-11-04 LAB — HEPATITIS B SURFACE ANTIBODY,QUALITATIVE: Hep B S Ab: REACTIVE — AB

## 2021-11-04 LAB — HEPATITIS B SURFACE ANTIGEN: Hepatitis B Surface Ag: NONREACTIVE

## 2021-11-04 MED ORDER — PENTAFLUOROPROP-TETRAFLUOROETH EX AERO
1.0000 "application " | INHALATION_SPRAY | CUTANEOUS | Status: DC | PRN
  Filled 2021-11-04: qty 30

## 2021-11-04 MED ORDER — LIDOCAINE-PRILOCAINE 2.5-2.5 % EX CREA
1.0000 "application " | TOPICAL_CREAM | CUTANEOUS | Status: DC | PRN
  Filled 2021-11-04: qty 5

## 2021-11-04 MED ORDER — HEPARIN SODIUM (PORCINE) 1000 UNIT/ML IJ SOLN
INTRAMUSCULAR | Status: AC
  Filled 2021-11-04: qty 10

## 2021-11-04 MED ORDER — SODIUM CHLORIDE 0.9 % IV SOLN: 100.0000 mL | INTRAVENOUS | Status: DC | PRN

## 2021-11-04 MED ORDER — HEPARIN SODIUM (PORCINE) 1000 UNIT/ML DIALYSIS
1000.0000 [IU] | INTRAMUSCULAR | Status: DC | PRN
  Filled 2021-11-04: qty 1

## 2021-11-04 MED ORDER — LIDOCAINE HCL (PF) 1 % IJ SOLN
5.0000 mL | INTRAMUSCULAR | Status: DC | PRN
  Filled 2021-11-04: qty 5

## 2021-11-04 MED ORDER — ALTEPLASE 2 MG IJ SOLR
2.0000 mg | Freq: Once | INTRAMUSCULAR | Status: DC | PRN
  Filled 2021-11-04: qty 2

## 2021-11-04 NOTE — Anesthesia Postprocedure Evaluation (Signed)
Anesthesia Post Note  Patient: Bobby Sampson  Procedure(s) Performed: INTRAMEDULLARY (IM) NAIL INTERTROCHANTRIC (Right: Hip)  Patient location during evaluation: PACU Anesthesia Type: General Level of consciousness: awake and alert Pain management: pain level controlled Vital Signs Assessment: post-procedure vital signs reviewed and stable Respiratory status: spontaneous breathing, nonlabored ventilation, respiratory function stable and patient connected to nasal cannula oxygen Cardiovascular status: blood pressure returned to baseline and stable Postop Assessment: no apparent nausea or vomiting Anesthetic complications: no   No notable events documented.   Last Vitals:  Vitals:   11/03/21 2350 11/04/21 0517  BP: (!) 160/73 (!) 154/68  Pulse: 86 95  Resp: 18 18  Temp: 37 C 36.7 C  SpO2: 92% 99%    Last Pain:  Vitals:   11/04/21 0517  TempSrc: Oral  PainSc:                  Molli Barrows

## 2021-11-04 NOTE — NC FL2 (Signed)
[place] LEVEL OF CARE SCREENING TOOL     IDENTIFICATION  Patient Name: Bobby Sampson Birthdate: 09-18-1961 Sex: male Admission Date (Current Location): 11/02/2021  [facility] and [place] Number:  Engineering geologist and Address:  [facility], [place], [address] [place], [place] [address]      Provider Number: [id number]  Attending Physician Name and Address:  Wyvonnia Dusky, MD  Relative Name and Phone Number:  [name] spouse [phone]    Current Level of Care: SNF Recommended Level of Care: [place] Prior Approval Number:    Date Approved/Denied:   PASRR Number: [id number]  Discharge Plan: SNF    Current Diagnoses: Patient Active Problem List   Diagnosis Date Noted   Closed right hip fracture ([place]) 11/02/2021   Type II diabetes mellitus with renal manifestations ([place]) 11/02/2021   Fall 11/02/2021   ESRD on dialysis ([place]) 11/02/2021   Anemia in ESRD (end-stage renal disease) ([place]) 11/02/2021   GERD (gastroesophageal reflux disease) 11/02/2021   Depression 11/02/2021   Chronic diastolic CHF (congestive heart failure) ([place]) 11/02/2021   Stroke ([place]) 02/16/2021   CKD (chronic kidney disease), stage IV (HCC)    Sleep apnea    Obesity (BMI 30.0-34.9)    Acute CVA (cerebrovascular accident) ([place]) 02/15/2021   Essential hypertension    Type 2 diabetes mellitus with hyperglycemia, with long-term current use of insulin (HCC)    AKI (acute kidney injury) ([place])    COVID-19 11/07/2019    Orientation RESPIRATION BLADDER Height & Weight     Self, Time, Situation, Place  Normal Continent Weight: 102.3 kg Height:  5\' 9"  (175.3 cm)  BEHAVIORAL SYMPTOMS/MOOD NEUROLOGICAL BOWEL NUTRITION STATUS      Continent Diet (regular)  AMBULATORY STATUS COMMUNICATION OF NEEDS Skin   Extensive Assist Verbally Normal, Surgical wounds                       Personal Care Assistance Level of Assistance  Bathing, Feeding,  Dressing Bathing Assistance: Limited assistance Feeding assistance: Independent Dressing Assistance: Limited assistance     Functional Limitations Info             SPECIAL CARE FACTORS FREQUENCY  PT (By licensed PT), OT (By licensed OT) ([facility] [employer], Mon, Wed, Friday 930 AM)     PT Frequency: 5 times per week OT Frequency: 5 times per week            Contractures Contractures Info: Not present    Additional Factors Info  Code Status, Allergies Code Status Info: full Allergies Info: Liraglutide, Trulicity (Dulaglutide)           Current Medications (11/04/2021):  This is the current hospital active medication list Current Facility-Administered Medications  Medication Dose Route Frequency Provider Last Rate Last Admin   0.9 %  sodium chloride infusion  100 mL Intravenous PRN Bhutani, Manpreet S, MD       0.9 %  sodium chloride infusion  100 mL Intravenous PRN Bhutani, Manpreet S, MD       acetaminophen (TYLENOL) tablet 325-650 mg  325-650 mg Oral Q6H PRN [place], MD       albuterol (PROVENTIL) (2.5 MG/3ML) 0.083% nebulizer solution 3 mL  3 mL Inhalation Q4H PRN [place], MD       alteplase (CATHFLO ACTIVASE) injection 2 mg  2 mg Intracatheter Once PRN Liana Gerold, MD       alum & mag hydroxide-simeth (MAALOX/MYLANTA)  200-200-20 MG/5ML suspension 30 mL  30 mL Oral Q4H PRN [place], MD       amLODipine (NORVASC) tablet 2.5 mg  2.5 mg Oral Daily [place], MD   2.5 mg at 11/04/21 1333   aspirin EC tablet 81 mg  81 mg Oral Daily [place], MD   81 mg at 11/04/21 1335   atorvastatin (LIPITOR) tablet 80 mg  80 mg Oral Daily [place], MD   80 mg at 11/04/21 1332   bisacodyl (DULCOLAX) suppository 10 mg  10 mg Rectal Daily PRN [place], MD       buPROPion (WELLBUTRIN XL) 24 hr tablet 150 mg  150 mg Oral Daily [place], MD   150 mg at 11/04/21 1335   Chlorhexidine Gluconate Cloth 2 % PADS 6 each  6  each Topical Q0600 Liana Gerold, MD   6 each at 11/04/21 0712   cholecalciferol (VITAMIN D) tablet 2,000 Units  2,000 Units Oral Daily [place], MD   2,000 Units at 11/04/21 1336   dextromethorphan-guaiFENesin (MUCINEX DM) 30-600 MG per 12 hr tablet 1 tablet  1 tablet Oral BID PRN [place], MD       docusate sodium (COLACE) capsule 100 mg  100 mg Oral BID [place], MD   100 mg at 11/04/21 1333   feeding supplement (NEPRO CARB STEADY) liquid 237 mL  237 mL Oral TID BM Wyvonnia Dusky, MD   237 mL at 11/04/21 1415   FLUoxetine (PROZAC) capsule 20 mg  20 mg Oral Daily [place], MD   20 mg at 11/04/21 1415   heparin injection 1,000 Units  1,000 Units Dialysis PRN Liana Gerold, MD       heparin injection 5,000 Units  5,000 Units Subcutaneous Q8H Beers, Brandon D, RPH       heparin sodium (porcine) 1000 UNIT/ML injection            hydrALAZINE (APRESOLINE) injection 5 mg  5 mg Intravenous Q2H PRN [place], MD   5 mg at 11/02/21 1975   HYDROmorphone (DILAUDID) injection 0.5-1 mg  0.5-1 mg Intravenous Q4H PRN [place], MD       insulin aspart (novoLOG) injection 0-5 Units  0-5 Units Subcutaneous QHS [place], MD   2 Units at 11/03/21 2149   insulin aspart (novoLOG) injection 0-9 Units  0-9 Units Subcutaneous TID WC [place], MD   1 Units at 11/04/21 1331   insulin glargine-yfgn (SEMGLEE) injection 14 Units  14 Units Subcutaneous BID [place], MD   14 Units at 11/04/21 1415   latanoprost (XALATAN) 0.005 % ophthalmic solution 1 drop  1 drop Both Eyes QHS [place], MD   1 drop at 11/03/21 2148   lidocaine (PF) (XYLOCAINE) 1 % injection 5 mL  5 mL Intradermal PRN Bhutani, Manpreet S, MD       lidocaine-prilocaine (EMLA) cream 1 application  1 application Topical PRN Bhutani, Manpreet S, MD       loratadine (CLARITIN) tablet 10 mg  10 mg Oral Daily [place], MD   10 mg at 11/04/21 1336    menthol-cetylpyridinium (CEPACOL) lozenge 3 mg  1 lozenge Oral PRN [place], MD       Or   phenol (CHLORASEPTIC) mouth spray 1 spray  1 spray Mouth/Throat PRN [place], MD       methocarbamol (ROBAXIN) tablet 500 mg  500 mg Oral Q6H PRN [place], MD   500 mg at  11/03/21 0122   Or   methocarbamol (ROBAXIN) 500 mg in dextrose 5 % 50 mL IVPB  500 mg Intravenous Q6H PRN [place], MD       metoprolol succinate (TOPROL-XL) 24 hr tablet 25 mg  25 mg Oral Daily [place], MD   25 mg at 11/04/21 1335   multivitamin (RENA-VIT) tablet 1 tablet  1 tablet Oral QHS Wyvonnia Dusky, MD   1 tablet at 11/03/21 2150   multivitamin with minerals tablet 1 tablet  1 tablet Oral Daily Wyvonnia Dusky, MD   1 tablet at 11/04/21 1332   ondansetron (ZOFRAN) tablet 4 mg  4 mg Oral Q6H PRN [place], MD       Or   ondansetron [facility]) injection 4 mg  4 mg Intravenous Q6H PRN [place], MD       oxyCODONE (Oxy IR/ROXICODONE) immediate release tablet 10-15 mg  10-15 mg Oral Q4H PRN [place], MD       oxyCODONE (Oxy IR/ROXICODONE) immediate release tablet 5-10 mg  5-10 mg Oral Q4H PRN [place], MD   10 mg at 11/03/21 0122   pantoprazole (PROTONIX) EC tablet 40 mg  40 mg Oral Daily [place], MD   40 mg at 11/04/21 1335   pentafluoroprop-tetrafluoroeth (GEBAUERS) aerosol 1 application  1 application Topical PRN Bhutani, Manpreet S, MD       polyethylene glycol (MIRALAX / GLYCOLAX) packet 17 g  17 g Oral Daily PRN [place], MD       senna (SENOKOT) tablet 8.6 mg  1 tablet Oral BID [place], MD   8.6 mg at 11/04/21 1336   senna-docusate (Senokot-S) tablet 1 tablet  1 tablet Oral QHS PRN [place] Park, MD       traMADol Veatrice [place]bon) tablet 50 mg  50 mg Oral Q12H [place], MD   50 mg at 11/04/21 1335     Discharge Medications: Please see discharge summary for a list of discharge medications.  Relevant Imaging  Results:  Relevant Lab Results:   Additional Information SS# [id number]  Conception Oms, RN

## 2021-11-04 NOTE — Progress Notes (Signed)
Physical Therapy Treatment Patient Details Name: Bobby Sampson MRN: [id number] DOB: 07-04-1961 Today's Date: 11/04/2021   History of Present Illness Pt is a 60 y/o M admitted on 11/02/21 with c/c of fall & R hip pain. Pt was found to have R basicervical/intertrochanteric hip fracture & underwent IM fixation on 11/02/21 by Dr. Mack Sampson. PMH: ESRD on HD MWF, HTN, HLD, DM, stroke with R sided weakness, GERD, depression, peripheral neuropathy, OSA not using CPAP consistently, dCHF    PT Comments    Pt seen for PT tx with pt agreeable. Pt is able to perform transfers with CGA<>min assist & ambulate very short distances in room with impaired gait pattern as noted below. Pt with difficulty utilizing walker to decrease weight bearing through RLE and with pain & weakness in RLE that limits gait pattern. Pt does perform RLE strengthening exercises with multimodal cuing for technique. Educated pt on updated d/c recommendation of STR as pt is unsafe to d/c home at this time & remains a high fall risk. Will continue to follow pt acutely to progress mobility as able.     Recommendations for follow up therapy are one component of a multi-disciplinary discharge planning process, led by the attending physician.  Recommendations may be updated based on patient status, additional functional criteria and insurance authorization.  Follow Up Recommendations  Skilled nursing-short term rehab (<3 hours/day)     Assistance Recommended at Discharge Frequent or constant Supervision/Assistance  Equipment Recommendations  Rolling walker (2 wheels);BSC/3in1    Recommendations for Other Services       Precautions / Restrictions Precautions Precautions: Fall Precaution Comments: RUE dialysis fistula Restrictions Weight Bearing Restrictions: Yes RLE Weight Bearing: Weight bearing as tolerated     Mobility  Bed Mobility               General bed mobility comments: pt received sitting EOB & left sitting in  recliner    Transfers Overall transfer level: Needs assistance Equipment used: Rolling walker (2 wheels) Transfers: Sit to/from Stand;Bed to chair/wheelchair/BSC Sit to Stand: Min guard Stand pivot transfers: Min assist              Ambulation/Gait Ambulation/Gait assistance: Min assist (PT also providing close chair follow for safety) Gait Distance (Feet):  (5 ft + 5 ft) Assistive device: Rolling walker (2 wheels) Gait Pattern/deviations: Decreased step length - left;Decreased step length - right;Decreased dorsiflexion - left;Decreased dorsiflexion - right;Decreased stride length;Decreased weight shift to right Gait velocity: significantly decreased     General Gait Details: Pt with decreased ability to weight bear through BUE on RW to off set weight bearing through RLE during LLE swing phase. Pt with decreased RLE foot clearance as well as LLE. pt with step-to pattern leading with RLE.   Stairs             Wheelchair Mobility    Modified Rankin (Stroke Patients Only)       Balance Overall balance assessment: Needs assistance Sitting-balance support: Feet supported;No upper extremity supported Sitting balance-Leahy Scale: Good     Standing balance support: Bilateral upper extremity supported;During functional activity;Reliant on assistive device for balance Standing balance-Leahy Scale: Poor Standing balance comment: requires BUE support on RW 2/2 RLE pain/weakness                            Cognition Arousal/Alertness: Awake/alert Behavior During Therapy: Flat affect Overall Cognitive Status: Within Functional Limits for tasks assessed  General Comments: Requires encouragement/education for increased participation        Exercises General Exercises - Lower Extremity Quad Sets: AROM;Strengthening;Right;10 reps Long Arc Quad: AROM;Strengthening;Right;10 reps;Seated Heel Slides:  AROM;Strengthening;Right;10 reps    General Comments        Pertinent Vitals/Pain Pain Assessment: Faces Pain Score: 6  Faces Pain Scale: Hurts even more Pain Location: RLE Pain Descriptors / Indicators: Discomfort;Grimacing Pain Intervention(s): RN gave pain meds during session;Limited activity within patient's tolerance;Monitored during session;Repositioned    Home Living                          Prior Function            PT Goals (current goals can now be found in the care plan section) Acute Rehab PT Goals Patient Stated Goal: get better, go home PT Goal Formulation: With patient Time For Goal Achievement: 11/17/21 Potential to Achieve Goals: Good Progress towards PT goals: Progressing toward goals    Frequency    BID      PT Plan Discharge plan needs to be updated    Co-evaluation              AM-PAC PT "6 Clicks" Mobility   Outcome Measure  Help needed turning from your back to your side while in a flat bed without using bedrails?: A Little Help needed moving from lying on your back to sitting on the side of a flat bed without using bedrails?: A Little Help needed moving to and from a bed to a chair (including a wheelchair)?: A Little Help needed standing up from a chair using your arms (e.g., wheelchair or bedside chair)?: A Little Help needed to walk in hospital room?: A Lot Help needed climbing 3-5 steps with a railing? : A Lot 6 Click Score: 16    End of Session Equipment Utilized During Treatment: Gait belt Activity Tolerance: Patient limited by fatigue;Patient limited by pain Patient left: in chair;with chair alarm set;with call bell/phone within reach Nurse Communication: Mobility status PT Visit Diagnosis: Unsteadiness on feet (R26.81);Muscle weakness (generalized) (M62.81);Difficulty in walking, not elsewhere classified (R26.2);Pain Pain - Right/Left: Right Pain - part of body: Hip     Time: 8110-3159 PT Time Calculation  (min) (ACUTE ONLY): 26 min  Charges:  $Gait Training: 8-22 mins $Therapeutic Activity: 8-22 mins                     Bobby Sampson, PT, DPT 11/04/21, 2:12 PM    Bobby Sampson 11/04/2021, 2:10 PM

## 2021-11-04 NOTE — Progress Notes (Signed)
PROGRESS NOTE    Bobby Sampson  JHE:[id number] DOB: 1961-08-20 DOA: 11/02/2021 PCP: [facility]    Assessment & Plan:   Principal Problem:   Closed right hip fracture [facility]) Active Problems:   Essential hypertension   Stroke ([place])   Type II diabetes mellitus with renal manifestations ([place])   Fall   ESRD on dialysis ([place])   Anemia in ESRD (end-stage renal disease) ([place])   GERD (gastroesophageal reflux disease)   Depression   Chronic diastolic CHF (congestive heart failure) (H[facility])   Closed right hip fracture: s/p intramedullary fixation of right basicervical hip fracture. Percocet, morphine & robaxin prn. PT now recs SNF    HTN: continue on CCB, BB. IV hydralazine    Hx of CVA: continue on statin, aspirin  DM2: poorly controlled, HbA1c 11.0. Continue on lantus, SSI w/ accuchecks     Fall: PT now recs SNF   ESRD: on HD MWF. Management as per nephro   Hyponatremia: WNL today. Will be managed w/ HD   ACD: likely secondary to ESRD. H&H are labile     GERD: continue on PPI    Depression: severity unknown. Continue on home dose of bupropion, fluoxetine   Chronic diastolic CHF: echo on 1/85/6314 showed EF > 55% with grade 1 diastolic dysfunction. CHF seems to be compensated. Volume management w/ HD    DVT prophylaxis: heparin  Code Status: full Family Communication:  Disposition Plan: possibly d/c to SNF   Level of care: Med-Surg  Status is: Inpatient  Remains inpatient appropriate because: needs SNF placement      Consultants:  Ortho surg   Procedures:   Antimicrobials:   Subjective: Pt c/o pain in hip w/ movement   Objective: Vitals:   11/03/21 2005 11/03/21 2006 11/03/21 2350 11/04/21 0517  BP: (!) 160/70  (!) 160/73 (!) 154/68  Pulse: 83 81 86 95  Resp: 15  18 18   Temp: 98.5 F (36.9 C)  98.6 F (37 C) 98 F (36.7 C)  TempSrc: Oral   Oral  SpO2: (!) 89% 98% 92% 99%  Weight:      Height:        Intake/Output Summary (Last  24 hours) at 11/04/2021 9702 Last data filed at 11/03/2021 1900 Gross per 24 hour  Intake 360 ml  Output 475 ml  Net -115 ml   Filed Weights   11/02/21 0405  Weight: 99 kg    Examination:  General exam: Appears comfortable  Respiratory system: clear breath sounds b/l  Cardiovascular system: S1/S2+. No rubs or clicks   Gastrointestinal system: Abd is soft, NT, ND & normal bowel sounds  Central nervous system: Alert and oriented. Moves all extremities  Psychiatry: Judgement and insight appear normal. Flat mood and affect    Data Reviewed: I have personally reviewed following labs and imaging studies  CBC: Recent Labs  Lab 11/02/21 0416 11/03/21 0418 11/04/21 0653  WBC 9.8 11.7* 10.0  HGB 11.9* 11.4* 10.9*  HCT 37.2* 35.4* 33.9*  MCV 83.0 83.5 83.3  PLT 200 201 637   Basic Metabolic Panel: Recent Labs  Lab 11/02/21 0416 11/03/21 0418 11/04/21 0653  NA 132* 131* 136  K 3.8 4.8 4.2  CL 96* 95* 99  CO2 30 27 28   GLUCOSE 416* 329* 237*  BUN 22 32* 50*  CREATININE 3.76* 4.35* 5.52*  CALCIUM 8.3* 8.3* 8.9   GFR: Estimated Creatinine Clearance: 16.1 mL/min (A) (by C-G formula based on SCr of 5.52 mg/dL (H)). Liver Function  Tests: No results for input(s): AST, ALT, ALKPHOS, BILITOT, PROT, ALBUMIN in the last 168 hours. No results for input(s): LIPASE, AMYLASE in the last 168 hours. No results for input(s): AMMONIA in the last 168 hours. Coagulation Profile: Recent Labs  Lab 11/02/21 0916  INR 1.1   Cardiac Enzymes: No results for input(s): CKTOTAL, CKMB, CKMBINDEX, TROPONINI in the last 168 hours. BNP (last 3 results) No results for input(s): PROBNP in the last 8760 hours. HbA1C: No results for input(s): HGBA1C in the last 72 hours. CBG: Recent Labs  Lab 11/02/21 2139 11/03/21 0740 11/03/21 1223 11/03/21 1642 11/03/21 2057  GLUCAP 215* 309* 274* 208* 206*   Lipid Profile: No results for input(s): CHOL, HDL, LDLCALC, TRIG, CHOLHDL, LDLDIRECT in the  last 72 hours. Thyroid Function Tests: No results for input(s): TSH, T4TOTAL, FREET4, T3FREE, THYROIDAB in the last 72 hours. Anemia Panel: No results for input(s): VITAMINB12, FOLATE, FERRITIN, TIBC, IRON, RETICCTPCT in the last 72 hours. Sepsis Labs: No results for input(s): PROCALCITON, LATICACIDVEN in the last 168 hours.  Recent Results (from the past 240 hour(s))  Resp Panel by RT-PCR (Flu A&B, Covid) Nasopharyngeal Swab     Status: None   Collection Time: 11/02/21  5:22 AM   Specimen: Nasopharyngeal Swab; Nasopharyngeal(NP) swabs in vial transport medium  Result Value Ref Range Status   SARS Coronavirus 2 by RT PCR NEGATIVE NEGATIVE Final    Comment: (NOTE) SARS-CoV-2 target nucleic acids are NOT DETECTED.  The SARS-CoV-2 RNA is generally detectable in upper respiratory specimens during the acute phase of infection. The lowest concentration of SARS-CoV-2 viral copies this assay can detect is 138 copies/mL. A negative result does not preclude SARS-Cov-2 infection and should not be used as the sole basis for treatment or other patient management decisions. A negative result may occur with  improper specimen collection/handling, submission of specimen other than nasopharyngeal swab, presence of viral mutation(s) within the areas targeted by this assay, and inadequate number of viral copies(<138 copies/mL). A negative result must be combined with clinical observations, patient history, and epidemiological information. The expected result is Negative.  Fact Sheet for Patients:  EntrepreneurPulse.com.au  Fact Sheet for Healthcare Providers:  IncredibleEmployment.be  This test is no t yet approved or cleared by the [place] FDA and  has been authorized for detection and/or diagnosis of SARS-CoV-2 by FDA under an Emergency Use Authorization (EUA). This EUA will remain  in effect (meaning this test can be used) for the duration of  the COVID-19 declaration under Section 564(b)(1) of the Act, 21 U.S.C.section 360bbb-3(b)(1), unless the authorization is terminated  or revoked sooner.       Influenza A by PCR NEGATIVE NEGATIVE Final   Influenza B by PCR NEGATIVE NEGATIVE Final    Comment: (NOTE) The Xpert Xpress SARS-CoV-2/FLU/RSV plus assay is intended as an aid in the diagnosis of influenza from Nasopharyngeal swab specimens and should not be used as a sole basis for treatment. Nasal washings and aspirates are unacceptable for Xpert Xpress SARS-CoV-2/FLU/RSV testing.  Fact Sheet for Patients: EntrepreneurPulse.com.au  Fact Sheet for Healthcare Providers: IncredibleEmployment.be  This test is not yet approved or cleared by the [place] FDA and has been authorized for detection and/or diagnosis of SARS-CoV-2 by FDA under an Emergency Use Authorization (EUA). This EUA will remain in effect (meaning this test can be used) for the duration of the COVID-19 declaration under Section 564(b)(1) of the Act, 21 U.S.C. section 360bbb-3(b)(1), unless the authorization is terminated or revoked.  Performed  at [facility], [address], [place], [place] [address]          Radiology Studies: CT HIP RIGHT WO CONTRAST  Result Date: 11/02/2021 CLINICAL DATA:  Right hip fracture EXAM: CT OF THE RIGHT HIP WITHOUT CONTRAST 3D ACQUISITION WORKSTATION RECONSTRUCTION TECHNIQUE: Multidetector CT imaging of the right hip was performed according to the standard protocol. Multiplanar CT image reconstructions were also generated. Three-dimensional reconstructions of the right hip and associated fracture were performed on the acquisition workstation. COMPARISON:  Radiographs 11/02/2021 FINDINGS: Bones/Joint/Cartilage Somewhat vertical fracture through the right femoral neck involves the transcervical region superiorly but extends to the basicervical region inferiorly. The fracture  appears to be intra-articular and no true intertrochanteric component is identified. 28 degrees of apex anterior angulation as measured on image 43 series 2. There is also some mild varus angulation and some minimal comminution of the fracture inferiorly. No regional pelvic fracture. Small bone island in the right ischium. Ligaments Suboptimally assessed by CT. Muscles and Tendons Aside from mild indistinctness of fascia planes in the immediate vicinity of the fracture, regional musculotendinous structures appear unremarkable. Trace edema anterior to the right iloipsoas muscle for example on image 33 series 3. Soft tissues Arterial atherosclerosis. Calcification of the vas deferens as can be encountered in the setting of diabetes. Mild chondrocalcinosis in the pubic symphysis. IMPRESSION: 1. Right femoral neck fracture involving the transcervical region superiorly and extending to the basicervical region inferiorly. The fracture appears to be intra-articular without substantial trochanteric component. 20 degrees of apex anterior angulation along with some mild varus angulation. Minimal comminution inferiorly. 2. Atherosclerosis. Electronically Signed   By: Van Clines M.D.   On: 11/02/2021 11:21   CT 3D RECON AT SCANNER  Result Date: 11/02/2021 CLINICAL DATA:  Right hip fracture EXAM: CT OF THE RIGHT HIP WITHOUT CONTRAST 3D ACQUISITION WORKSTATION RECONSTRUCTION TECHNIQUE: Multidetector CT imaging of the right hip was performed according to the standard protocol. Multiplanar CT image reconstructions were also generated. Three-dimensional reconstructions of the right hip and associated fracture were performed on the acquisition workstation. COMPARISON:  Radiographs 11/02/2021 FINDINGS: Bones/Joint/Cartilage Somewhat vertical fracture through the right femoral neck involves the transcervical region superiorly but extends to the basicervical region inferiorly. The fracture appears to be intra-articular and  no true intertrochanteric component is identified. 28 degrees of apex anterior angulation as measured on image 43 series 2. There is also some mild varus angulation and some minimal comminution of the fracture inferiorly. No regional pelvic fracture. Small bone island in the right ischium. Ligaments Suboptimally assessed by CT. Muscles and Tendons Aside from mild indistinctness of fascia planes in the immediate vicinity of the fracture, regional musculotendinous structures appear unremarkable. Trace edema anterior to the right iloipsoas muscle for example on image 33 series 3. Soft tissues Arterial atherosclerosis. Calcification of the vas deferens as can be encountered in the setting of diabetes. Mild chondrocalcinosis in the pubic symphysis. IMPRESSION: 1. Right femoral neck fracture involving the transcervical region superiorly and extending to the basicervical region inferiorly. The fracture appears to be intra-articular without substantial trochanteric component. 20 degrees of apex anterior angulation along with some mild varus angulation. Minimal comminution inferiorly. 2. Atherosclerosis. Electronically Signed   By: Van Clines M.D.   On: 11/02/2021 11:21   DG C-Arm 1-60 Min-No Report  Result Date: 11/02/2021 Fluoroscopy was utilized by the requesting physician.  No radiographic interpretation.   DG C-Arm 1-60 Min-No Report  Result Date: 11/02/2021 Fluoroscopy was utilized by the requesting physician.  No radiographic interpretation.   DG HIP UNILAT WITH PELVIS 2-3 VIEWS RIGHT  Result Date: 11/02/2021 CLINICAL DATA:  Intramedullary nail fixation, femoral neck fracture EXAM: DG HIP (WITH OR WITHOUT PELVIS) 2-3V RIGHT; DG C-ARM 1-60 MIN-NO REPORT COMPARISON:  CT right hip, 11/02/2021, 10:07 a.m. FLUOROSCOPY TIME:  1:21 FINDINGS: Intraoperative fluoroscopic images of the right hip demonstrate intramedullary nail fixation of transcervical fracture of the right femoral neck. Postoperative  radiographs demonstrate near anatomic alignment of fracture with expected overlying change and no perihardware fracture. IMPRESSION: 1. Intraoperative fluoroscopic images of the right hip demonstrate intramedullary nail fixation of transcervical fracture of the right femoral neck. 2. Postoperative radiographs demonstrate near anatomic alignment of fracture with expected overlying change and no perihardware fracture. Electronically Signed   By: Delanna Ahmadi M.D.   On: 11/02/2021 16:25   DG HIP UNILAT WITH PELVIS 2-3 VIEWS RIGHT  Result Date: 11/02/2021 CLINICAL DATA:  Intramedullary nail fixation, femoral neck fracture EXAM: DG HIP (WITH OR WITHOUT PELVIS) 2-3V RIGHT; DG C-ARM 1-60 MIN-NO REPORT COMPARISON:  CT right hip, 11/02/2021, 10:07 a.m. FLUOROSCOPY TIME:  1:21 FINDINGS: Intraoperative fluoroscopic images of the right hip demonstrate intramedullary nail fixation of transcervical fracture of the right femoral neck. Postoperative radiographs demonstrate near anatomic alignment of fracture with expected overlying change and no perihardware fracture. IMPRESSION: 1. Intraoperative fluoroscopic images of the right hip demonstrate intramedullary nail fixation of transcervical fracture of the right femoral neck. 2. Postoperative radiographs demonstrate near anatomic alignment of fracture with expected overlying change and no perihardware fracture. Electronically Signed   By: Delanna Ahmadi M.D.   On: 11/02/2021 16:25        Scheduled Meds:  amLODipine  2.5 mg Oral Daily   aspirin EC  81 mg Oral Daily   atorvastatin  80 mg Oral Daily   buPROPion  150 mg Oral Daily   Chlorhexidine Gluconate Cloth  6 each Topical Q0600   cholecalciferol  2,000 Units Oral Daily   docusate sodium  100 mg Oral BID   feeding supplement (NEPRO CARB STEADY)  237 mL Oral TID BM   FLUoxetine  20 mg Oral Daily   heparin injection (subcutaneous)  5,000 Units Subcutaneous Q8H   insulin aspart  0-5 Units Subcutaneous QHS    insulin aspart  0-9 Units Subcutaneous TID WC   insulin glargine-yfgn  14 Units Subcutaneous BID   latanoprost  1 drop Both Eyes QHS   loratadine  10 mg Oral Daily   metoprolol succinate  25 mg Oral Daily   multivitamin  1 tablet Oral QHS   multivitamin with minerals  1 tablet Oral Daily   pantoprazole  40 mg Oral Daily   senna  1 tablet Oral BID   traMADol  50 mg Oral Q12H   Continuous Infusions:  methocarbamol (ROBAXIN) IV       LOS: 2 days    Time spent: 30 mins    Wyvonnia Dusky, MD [facility] Pager [phone]  If 7PM-7AM, please contact night-coverage 11/04/2021, 8:22 AM

## 2021-11-04 NOTE — Progress Notes (Addendum)
PT Cancellation Note  Patient Details Name: Bobby Sampson MRN: [id number] DOB: 28-Apr-1961   Cancelled Treatment:    Reason Eval/Treat Not Completed: Other (comment);Patient at procedure or test/unavailable Attempted to see pt for PT tx but pt off unit at hemodialysis. Will f/u as able.  Attempted to see pt again at 11:15 AM but pt still off floor for dialysis. Will re-attempt in the afternoon for PM session.  [name], PT, DPT 11/04/21, 11:20 AM    [name] 11/04/2021, 9:26 AM

## 2021-11-04 NOTE — Progress Notes (Signed)
Patient with CVC to right chest, without signs of infection, completes HD session early due to clotted dialyzer. Patient states that he has been refusing heparin injections. Targeted UF not met, fluid removed 1739, post weight 100.4 kg. Patient encouraged to comply with medication regimen. Dressing changed, no complaint of pain nor signs of hypoglycemia during treatment. Post treatment report given to primary nurse, patient return to floor.

## 2021-11-04 NOTE — Progress Notes (Signed)
Inpatient Diabetes Program Recommendations  AACE/ADA: New Consensus Statement on Inpatient Glycemic Control (2015)  Target Ranges:  Prepandial:   less than 140 mg/dL      Peak postprandial:   less than 180 mg/dL (1-2 hours)      Critically ill patients:  140 - 180 mg/dL   Lab Results  Component Value Date   GLUCAP 216 (H) 11/04/2021   HGBA1C 11.0 (H) 02/16/2021    Review of Glycemic Control  Latest Reference Range & Units 11/03/21 07:40 11/03/21 12:23 11/03/21 16:42 11/03/21 20:57 11/04/21 08:22  Glucose-Capillary 70 - 99 mg/dL 309 (H) 274 (H) 208 (H) 206 (H) 216 (H)  (H): Data is abnormally high  Diabetes history: DM2 Outpatient Diabetes medications: Lantus 20 units BID, Novolog 10 units TID Current orders for Inpatient glycemic control: Novolog 0-9 units TID and 0-5 units QHS, Semglee 14 units BID  Inpatient Diabetes Program Recommendations:    Might consider, Semglee 16 units BID  Will continue to follow while inpatient.  Thank you, Reche Dixon, RN, BSN Diabetes Coordinator Inpatient Diabetes Program [phone] (team pager from 8a-5p)

## 2021-11-04 NOTE — Progress Notes (Signed)
[facility]  ROUNDING NOTE   Subjective:   Patient seen and evaluated during dialysis   HEMODIALYSIS FLOWSHEET:  Blood Flow Rate (mL/min): 400 mL/min Arterial Pressure (mmHg): -180 mmHg Venous Pressure (mmHg): 150 mmHg Transmembrane Pressure (mmHg): 40 mmHg Ultrafiltration Rate (mL/min): 720 mL/min Dialysate Flow Rate (mL/min): 600 ml/min Conductivity: Machine : 13.5 Conductivity: Machine : 13.5  No complaints at this time Hip pain well managed  Objective:  Vital signs in last 24 hours:  Temp:  [97.6 F (36.4 C)-98.9 F (37.2 C)] 98.4 F (36.9 C) (12/05 1322) Pulse Rate:  [76-95] 93 (12/05 1322) Resp:  [14-23] 18 (12/05 1322) BP: (116-186)/(60-136) 172/83 (12/05 1322) SpO2:  [89 %-100 %] 94 % (12/05 1322) Weight:  [102.3 kg] 102.3 kg (12/05 0856)  Weight change:  Filed Weights   11/02/21 0405 11/04/21 0856  Weight: 99 kg 102.3 kg    Intake/Output: I/O last 3 completed shifts: In: 600 [P.O.:600] Out: 475 [Urine:475]   Intake/Output this shift:  Total I/O In: -  Out: 2025 [Other:1739]  Physical Exam: General: NAD, resting  Head: Normocephalic, atraumatic. Moist oral mucosal membranes  Eyes: Anicteric  Lungs:  Clear to auscultation, normal effort  Heart: Regular rate and rhythm  Abdomen:  Soft, nontender  Extremities: Trace peripheral edema.  Neurologic: Nonfocal, moving all four extremities  Skin: No lesions  Access: Right chest PermCath    Basic Metabolic Panel: Recent Labs  Lab 11/02/21 0416 11/03/21 0418 11/04/21 0653  NA 132* 131* 136  K 3.8 4.8 4.2  CL 96* 95* 99  CO2 30 27 28   GLUCOSE 416* 329* 237*  BUN 22 32* 50*  CREATININE 3.76* 4.35* 5.52*  CALCIUM 8.3* 8.3* 8.9    Liver Function Tests: No results for input(s): AST, ALT, ALKPHOS, BILITOT, PROT, ALBUMIN in the last 168 hours. No results for input(s): LIPASE, AMYLASE in the last 168 hours. No results for input(s): AMMONIA in the last 168 hours.  CBC: Recent Labs   Lab 11/02/21 0416 11/03/21 0418 11/04/21 0653  WBC 9.8 11.7* 10.0  HGB 11.9* 11.4* 10.9*  HCT 37.2* 35.4* 33.9*  MCV 83.0 83.5 83.3  PLT 200 201 210    Cardiac Enzymes: No results for input(s): CKTOTAL, CKMB, CKMBINDEX, TROPONINI in the last 168 hours.  BNP: Invalid input(s): POCBNP  CBG: Recent Labs  Lab 11/03/21 1223 11/03/21 1642 11/03/21 2057 11/04/21 0822 11/04/21 1330  GLUCAP 274* 208* 206* 216* 143*    Microbiology: Results for orders placed or performed during the hospital encounter of 11/02/21  Resp Panel by RT-PCR (Flu A&B, Covid) Nasopharyngeal Swab     Status: None   Collection Time: 11/02/21  5:22 AM   Specimen: Nasopharyngeal Swab; Nasopharyngeal(NP) swabs in vial transport medium  Result Value Ref Range Status   SARS Coronavirus 2 by RT PCR NEGATIVE NEGATIVE Final    Comment: (NOTE) SARS-CoV-2 target nucleic acids are NOT DETECTED.  The SARS-CoV-2 RNA is generally detectable in upper respiratory specimens during the acute phase of infection. The lowest concentration of SARS-CoV-2 viral copies this assay can detect is 138 copies/mL. A negative result does not preclude SARS-Cov-2 infection and should not be used as the sole basis for treatment or other patient management decisions. A negative result may occur with  improper specimen collection/handling, submission of specimen other than nasopharyngeal swab, presence of viral mutation(s) within the areas targeted by this assay, and inadequate number of viral copies(<138 copies/mL). A negative result must be combined with clinical observations, patient history, and  epidemiological information. The expected result is Negative.  Fact Sheet for Patients:  EntrepreneurPulse.com.au  Fact Sheet for Healthcare Providers:  IncredibleEmployment.be  This test is no t yet approved or cleared by the [place] FDA and  has been authorized for detection and/or diagnosis  of SARS-CoV-2 by FDA under an Emergency Use Authorization (EUA). This EUA will remain  in effect (meaning this test can be used) for the duration of the COVID-19 declaration under Section 564(b)(1) of the Act, 21 U.S.C.section 360bbb-3(b)(1), unless the authorization is terminated  or revoked sooner.       Influenza A by PCR NEGATIVE NEGATIVE Final   Influenza B by PCR NEGATIVE NEGATIVE Final    Comment: (NOTE) The Xpert Xpress SARS-CoV-2/FLU/RSV plus assay is intended as an aid in the diagnosis of influenza from Nasopharyngeal swab specimens and should not be used as a sole basis for treatment. Nasal washings and aspirates are unacceptable for Xpert Xpress SARS-CoV-2/FLU/RSV testing.  Fact Sheet for Patients: EntrepreneurPulse.com.au  Fact Sheet for Healthcare Providers: IncredibleEmployment.be  This test is not yet approved or cleared by the [place] FDA and has been authorized for detection and/or diagnosis of SARS-CoV-2 by FDA under an Emergency Use Authorization (EUA). This EUA will remain in effect (meaning this test can be used) for the duration of the COVID-19 declaration under Section 564(b)(1) of the Act, 21 U.S.C. section 360bbb-3(b)(1), unless the authorization is terminated or revoked.  Performed at [facility], [address] [place], [place] [address]     Coagulation Studies: Recent Labs    11/02/21 0916  LABPROT 13.8  INR 1.1    Urinalysis: No results for input(s): COLORURINE, LABSPEC, PHURINE, GLUCOSEU, HGBUR, BILIRUBINUR, KETONESUR, PROTEINUR, UROBILINOGEN, NITRITE, LEUKOCYTESUR in the last 72 hours.  Invalid input(s): APPERANCEUR    Imaging: DG C-Arm 1-60 Min-No Report  Result Date: 11/02/2021 CLINICAL DATA:  Intramedullary nail fixation, femoral neck fracture EXAM: DG HIP (WITH OR WITHOUT PELVIS) 2-3V RIGHT; DG C-ARM 1-60 MIN-NO REPORT COMPARISON:  CT right hip, 11/02/2021, 10:07 a.m.  FLUOROSCOPY TIME:  1:21 FINDINGS: Intraoperative fluoroscopic images of the right hip demonstrate intramedullary nail fixation of transcervical fracture of the right femoral neck. Postoperative radiographs demonstrate near anatomic alignment of fracture with expected overlying change and no perihardware fracture. IMPRESSION: 1. Intraoperative fluoroscopic images of the right hip demonstrate intramedullary nail fixation of transcervical fracture of the right femoral neck. 2. Postoperative radiographs demonstrate near anatomic alignment of fracture with expected overlying change and no perihardware fracture. Electronically Signed   By: Delanna Ahmadi M.D.   On: 11/02/2021 16:25   DG C-Arm 1-60 Min-No Report  Result Date: 11/02/2021 CLINICAL DATA:  Intramedullary nail fixation, femoral neck fracture EXAM: DG HIP (WITH OR WITHOUT PELVIS) 2-3V RIGHT; DG C-ARM 1-60 MIN-NO REPORT COMPARISON:  CT right hip, 11/02/2021, 10:07 a.m. FLUOROSCOPY TIME:  1:21 FINDINGS: Intraoperative fluoroscopic images of the right hip demonstrate intramedullary nail fixation of transcervical fracture of the right femoral neck. Postoperative radiographs demonstrate near anatomic alignment of fracture with expected overlying change and no perihardware fracture. IMPRESSION: 1. Intraoperative fluoroscopic images of the right hip demonstrate intramedullary nail fixation of transcervical fracture of the right femoral neck. 2. Postoperative radiographs demonstrate near anatomic alignment of fracture with expected overlying change and no perihardware fracture. Electronically Signed   By: Delanna Ahmadi M.D.   On: 11/02/2021 16:25   DG HIP UNILAT WITH PELVIS 2-3 VIEWS RIGHT  Result Date: 11/02/2021 CLINICAL DATA:  Intramedullary nail fixation, femoral neck fracture EXAM:  DG HIP (WITH OR WITHOUT PELVIS) 2-3V RIGHT; DG C-ARM 1-60 MIN-NO REPORT COMPARISON:  CT right hip, 11/02/2021, 10:07 a.m. FLUOROSCOPY TIME:  1:21 FINDINGS: Intraoperative  fluoroscopic images of the right hip demonstrate intramedullary nail fixation of transcervical fracture of the right femoral neck. Postoperative radiographs demonstrate near anatomic alignment of fracture with expected overlying change and no perihardware fracture. IMPRESSION: 1. Intraoperative fluoroscopic images of the right hip demonstrate intramedullary nail fixation of transcervical fracture of the right femoral neck. 2. Postoperative radiographs demonstrate near anatomic alignment of fracture with expected overlying change and no perihardware fracture. Electronically Signed   By: Delanna Ahmadi M.D.   On: 11/02/2021 16:25   DG HIP UNILAT WITH PELVIS 2-3 VIEWS RIGHT  Result Date: 11/02/2021 CLINICAL DATA:  Intramedullary nail fixation, femoral neck fracture EXAM: DG HIP (WITH OR WITHOUT PELVIS) 2-3V RIGHT; DG C-ARM 1-60 MIN-NO REPORT COMPARISON:  CT right hip, 11/02/2021, 10:07 a.m. FLUOROSCOPY TIME:  1:21 FINDINGS: Intraoperative fluoroscopic images of the right hip demonstrate intramedullary nail fixation of transcervical fracture of the right femoral neck. Postoperative radiographs demonstrate near anatomic alignment of fracture with expected overlying change and no perihardware fracture. IMPRESSION: 1. Intraoperative fluoroscopic images of the right hip demonstrate intramedullary nail fixation of transcervical fracture of the right femoral neck. 2. Postoperative radiographs demonstrate near anatomic alignment of fracture with expected overlying change and no perihardware fracture. Electronically Signed   By: Delanna Ahmadi M.D.   On: 11/02/2021 16:25     Medications:    methocarbamol (ROBAXIN) IV      amLODipine  2.5 mg Oral Daily   aspirin EC  81 mg Oral Daily   atorvastatin  80 mg Oral Daily   buPROPion  150 mg Oral Daily   Chlorhexidine Gluconate Cloth  6 each Topical Q0600   cholecalciferol  2,000 Units Oral Daily   docusate sodium  100 mg Oral BID   feeding supplement (NEPRO CARB  STEADY)  237 mL Oral TID BM   FLUoxetine  20 mg Oral Daily   heparin injection (subcutaneous)  5,000 Units Subcutaneous Q8H   heparin sodium (porcine)       insulin aspart  0-5 Units Subcutaneous QHS   insulin aspart  0-9 Units Subcutaneous TID WC   insulin glargine-yfgn  14 Units Subcutaneous BID   latanoprost  1 drop Both Eyes QHS   loratadine  10 mg Oral Daily   metoprolol succinate  25 mg Oral Daily   multivitamin  1 tablet Oral QHS   multivitamin with minerals  1 tablet Oral Daily   pantoprazole  40 mg Oral Daily   senna  1 tablet Oral BID   traMADol  50 mg Oral Q12H   acetaminophen, albuterol, alum & mag hydroxide-simeth, bisacodyl, dextromethorphan-guaiFENesin, hydrALAZINE, HYDROmorphone (DILAUDID) injection, menthol-cetylpyridinium **OR** phenol, methocarbamol **OR** methocarbamol (ROBAXIN) IV, ondansetron **OR** ondansetron (ZOFRAN) IV, oxyCODONE, oxyCODONE, polyethylene glycol, senna-docusate  Assessment/ Plan:  Mr. Noe Goyer is a 60 y.o.  male  with medical history significant of ESRD-HD (MWF), HTN, HLD, DM, stroke with right sided weakness, GERD, depression, peripheral neuropathy, OSA not using CPAP consistently, dCHF.  Patient has been admitted for Fall [W19.XXXA] Hyperglycemia [R73.9] Closed right hip fracture ([place]) [S72.001A] Closed fracture of right hip, initial encounter ([place]) [S72.001A]    End-stage renal disease on dialysis.  Will maintain outpatient schedule if possible.  Patient receiving dialysis today.  Per HD RN, patient dialyzer clotted resulting in early termination of treatment.  22 minutes remaining.  UF achieved of 1.7  L.  Next treatment scheduled for Wednesday.  2. Anemia of chronic kidney disease Lab Results  Component Value Date   HGB 10.9 (L) 11/04/2021  Hemoglobin within acceptable range.  No need for ESA's at at this time  3. Secondary Hyperparathyroidism:  Lab Results  Component Value Date   CALCIUM 8.9 11/04/2021   PHOS 4.6 02/16/2021   Mircera prescribed every 4 weeks outpatient. Will obtain updated phosphorus with a.m. labs.  4. Diabetes mellitus type II with chronic kidney disease insulin dependent. Home regimen includes Lantus and NovoLog. Most recent hemoglobin A1c is 11.0 on 02/16/21.  Glucose elevated at times  5.  Right hip fracture. Intramedullary fixation of right basicervical hip fracture performed on 11/02/2021.  Currently receiving pain management and physical therapy.      LOS: 2 [place] 12/5/20221:43 PM

## 2021-11-04 NOTE — Progress Notes (Signed)
Subjective:  POD #2 s/p medullary fixation for right intertrochanteric hip fracture.   Patient reports right hip pain as mild.  Patient hemodialysis earlier today.  Objective:   VITALS:   Vitals:   11/04/21 1215 11/04/21 1230 11/04/21 1322 11/04/21 1537  BP: (!) 146/78 (!) 150/77 (!) 172/83 133/70  Pulse: 92 95 93 94  Resp: (!) 23 20 18 15   Temp:  98.9 F (37.2 C) 98.4 F (36.9 C) 98.2 F (36.8 C)  TempSrc:  Oral    SpO2:   94% 98%  Weight:      Height:        PHYSICAL EXAM: Right lower extremity Sensation intact distally Intact pulses distally Dorsiflexion/Plantar flexion intact Incision: dressing C/D/I No cellulitis present Compartment soft  LABS  Results for orders placed or performed during the hospital encounter of 11/02/21 (from the past 24 hour(s))  Glucose, capillary     Status: Abnormal   Collection Time: 11/03/21  4:42 PM  Result Value Ref Range   Glucose-Capillary 208 (H) 70 - 99 mg/dL   Comment 1 Notify RN    Comment 2 Document in Chart   Glucose, capillary     Status: Abnormal   Collection Time: 11/03/21  8:57 PM  Result Value Ref Range   Glucose-Capillary 206 (H) 70 - 99 mg/dL  CBC     Status: Abnormal   Collection Time: 11/04/21  6:53 AM  Result Value Ref Range   WBC 10.0 4.0 - 10.5 K/uL   RBC 4.07 (L) 4.22 - 5.81 MIL/uL   Hemoglobin 10.9 (L) 13.0 - 17.0 g/dL   HCT 33.9 (L) 39.0 - 52.0 %   MCV 83.3 80.0 - 100.0 fL   MCH 26.8 26.0 - 34.0 pg   MCHC 32.2 30.0 - 36.0 g/dL   RDW 14.8 11.5 - 15.5 %   Platelets 210 150 - 400 K/uL   nRBC 0.0 0.0 - 0.2 %  Basic metabolic panel     Status: Abnormal   Collection Time: 11/04/21  6:53 AM  Result Value Ref Range   Sodium 136 135 - 145 mmol/L   Potassium 4.2 3.5 - 5.1 mmol/L   Chloride 99 98 - 111 mmol/L   CO2 28 22 - 32 mmol/L   Glucose, Bld 237 (H) 70 - 99 mg/dL   BUN 50 (H) 8 - 23 mg/dL   Creatinine, Ser 5.52 (H) 0.61 - 1.24 mg/dL   Calcium 8.9 8.9 - 10.3 mg/dL   GFR, Estimated 11 (L) >60  mL/min   Anion gap 9 5 - 15  Glucose, capillary     Status: Abnormal   Collection Time: 11/04/21  8:22 AM  Result Value Ref Range   Glucose-Capillary 216 (H) 70 - 99 mg/dL  Glucose, capillary     Status: Abnormal   Collection Time: 11/04/21  1:30 PM  Result Value Ref Range   Glucose-Capillary 143 (H) 70 - 99 mg/dL  Glucose, capillary     Status: Abnormal   Collection Time: 11/04/21  4:13 PM  Result Value Ref Range   Glucose-Capillary 211 (H) 70 - 99 mg/dL    No results found.  Assessment/Plan: 2 Days Post-Op   Principal Problem:   Closed right hip fracture (HCC) Active Problems:   Essential hypertension   Stroke (HCC)   Type II diabetes mellitus with renal manifestations ([place])   Fall   ESRD on dialysis ([place])   Anemia in ESRD (end-stage renal disease) ([place])   GERD (gastroesophageal reflux disease)  Depression   Chronic diastolic CHF (congestive heart failure) ([place])  Patient is stable post-op.  Continue PT.  Continue hemodialysis.  Patient progressing as expected from an orthopedic standpoint.  Physical therapy is recommending a skilled nursing facility upon discharge.  I am in agreement with this plan.  Patient on heparin for DVT prophylaxis.  Will defer to nephrology as far as appropriate DVT prophylaxis upon discharge.  Patient will need anticoagulation for a total of 6 weeks postop.  From an orthopedic standpoint, the patient would be normally discharged on Lovenox 40 mg daily or enteric-coated aspirin 325 mg p.o. twice daily.    Thornton Park , MD 11/04/2021, 4:41 PM

## 2021-11-04 NOTE — TOC Progression Note (Signed)
Transition of Care [facility]) - Progression Note    Patient Details  Name: Bobby Sampson MRN: [id number] Date of Birth: 1961-08-10  Transition of Care [facility]) CM/SW [place], RN Phone Number: 11/04/2021, 2:11 PM  Clinical Narrative:   Called the patient's wife Bobby Sampson with permission from the patient, the patient goes to Dialysis on [place] chair time at 930-10 Mon, [place] and Friday, She stated that the patient may not want to go, I went in and spoke to the patient, He stated that he feels thqat it may be best if he went to [facility], his wife works all day and is unable to assist him, He stated that [facility] works with his dialysis center, I reached out to [facility] to ask to review for a bed offer    Expected Discharge Plan: [place] Barriers to Discharge: Continued Medical Work up  Expected Discharge Plan and Services Expected Discharge Plan: [place]   Discharge Planning Services: CM Consult Post Acute Care Choice: Durable Medical Equipment, Home Health Living arrangements for the past 2 months: Single Family Home                 DME Arranged: 3-N-1 DME Agency: [employer] Date DME Agency Contacted: 11/03/21 Time DME Agency Contacted: [phone] Representative spoke with at DME Agency: jasmine HH Arranged: PT, OT, Nurse's Aide           Social Determinants of Health (SDOH) Interventions    Readmission Risk Interventions No flowsheet data found.

## 2021-11-05 DIAGNOSIS — S72001A Fracture of unspecified part of neck of right femur, initial encounter for closed fracture: Secondary | ICD-10-CM | POA: Diagnosis not present

## 2021-11-05 DIAGNOSIS — N186 End stage renal disease: Secondary | ICD-10-CM | POA: Diagnosis not present

## 2021-11-05 DIAGNOSIS — E1122 Type 2 diabetes mellitus with diabetic chronic kidney disease: Secondary | ICD-10-CM | POA: Diagnosis not present

## 2021-11-05 DIAGNOSIS — Z992 Dependence on renal dialysis: Secondary | ICD-10-CM | POA: Diagnosis not present

## 2021-11-05 LAB — GLUCOSE, CAPILLARY
Glucose-Capillary: 266 mg/dL — ABNORMAL HIGH (ref 70–99)
Glucose-Capillary: 239 mg/dL — ABNORMAL HIGH (ref 70–99)
Glucose-Capillary: 269 mg/dL — ABNORMAL HIGH (ref 70–99)
Glucose-Capillary: 214 mg/dL — ABNORMAL HIGH (ref 70–99)

## 2021-11-05 LAB — BASIC METABOLIC PANEL
Anion gap: 9 (ref 5–15)
BUN: 40 mg/dL — ABNORMAL HIGH (ref 8–23)
CO2: 28 mmol/L (ref 22–32)
Calcium: 8.5 mg/dL — ABNORMAL LOW (ref 8.9–10.3)
Chloride: 96 mmol/L — ABNORMAL LOW (ref 98–111)
Creatinine, Ser: 4.85 mg/dL — ABNORMAL HIGH (ref 0.61–1.24)
GFR, Estimated: 13 mL/min — ABNORMAL LOW (ref >60)
Glucose, Bld: 207 mg/dL — ABNORMAL HIGH (ref 70–99)
Potassium: 3.2 mmol/L — ABNORMAL LOW (ref 3.5–5.1)
Sodium: 133 mmol/L — ABNORMAL LOW (ref 135–145)

## 2021-11-05 LAB — CBC
HCT: 31.8 % — ABNORMAL LOW (ref 39.0–52.0)
Hemoglobin: 10.3 g/dL — ABNORMAL LOW (ref 13.0–17.0)
MCH: 26.5 pg (ref 26.0–34.0)
MCHC: 32.4 g/dL (ref 30.0–36.0)
MCV: 81.7 fL (ref 80.0–100.0)
Platelets: 187 10*3/uL (ref 150–400)
RBC: 3.89 MIL/uL — ABNORMAL LOW (ref 4.22–5.81)
RDW: 14.8 % (ref 11.5–15.5)
WBC: 8.1 10*3/uL (ref 4.0–10.5)
nRBC: 0 % (ref 0.0–0.2)

## 2021-11-05 LAB — RESP PANEL BY RT-PCR (FLU A&B, COVID) ARPGX2
Influenza A by PCR: NEGATIVE
Influenza B by PCR: NEGATIVE
SARS Coronavirus 2 by RT PCR: NEGATIVE

## 2021-11-05 LAB — HEPATITIS B SURFACE ANTIBODY, QUANTITATIVE: Hep B S AB Quant (Post): 36.1 m[IU]/mL (ref >9.9)

## 2021-11-05 LAB — PHOSPHORUS: Phosphorus: 4.1 mg/dL (ref 2.5–4.6)

## 2021-11-05 NOTE — Progress Notes (Signed)
Hemodialysis patient known at [facility] MWF 10:00am. Patient stated that [facility]. Education provided, patient stated no dialysis concerns. Please contact me with any dialysis placement concerns.  Elvera Bicker Dialysis Coordinator [phone]

## 2021-11-05 NOTE — Progress Notes (Signed)
Inpatient Diabetes Program Recommendations  AACE/ADA: New Consensus Statement on Inpatient Glycemic Control (2015)  Target Ranges:  Prepandial:   less than 140 mg/dL      [facility] postprandial:   less than 180 mg/dL (1-2 hours)      Critically ill patients:  140 - 180 mg/dL   Lab Results  Component Value Date   GLUCAP 266 (H) 11/05/2021   HGBA1C 11.0 (H) 02/16/2021    Review of Glycemic Control  Latest Reference Range & Units 11/04/21 08:22 11/04/21 13:30 11/04/21 16:13 11/04/21 20:43 11/05/21 07:32  Glucose-Capillary 70 - 99 mg/dL 216 (H) 143 (H) 211 (H) 314 (H) 266 (H)  (H): Data is abnormally high  Current orders for Inpatient glycemic control: Semglee 14 units BID, Novolog 0-9 units TID and 0-5 QHS  Inpatient Diabetes Program Recommendations:    Increase basal to Semglee 16 units BID  Will continue to follow while inpatient.  Thank you, Reche Dixon, RN, BSN Diabetes Coordinator Inpatient Diabetes Program [phone] (team pager from 8a-5p)

## 2021-11-05 NOTE — Progress Notes (Signed)
Physical Therapy Treatment Patient Details Name: Bobby Sampson MRN: [id number] DOB: May 28, 1961 Today's Date: 11/05/2021   History of Present Illness Pt is a 60 y/o M admitted on 11/02/21 with c/c of fall & R hip pain. Pt was found to have R basicervical/intertrochanteric hip fracture & underwent IM fixation on 11/02/21 by Dr. Mack Guise. PMH: ESRD on HD MWF, HTN, HLD, DM, stroke with R sided weakness, GERD, depression, peripheral neuropathy, OSA not using CPAP consistently, dCHF    PT Comments    Pt seen prior to lunch. Received up in chair and anxious regarding mobility due to not trusting R LE to support him in standing. Pt educated on POC and goals for this session. R LE hamstring and heelcord stretching prior to mobility with AA R LAQ x 10.  Pt required Mod/MinA to raise to standing at RW due to not wanting to shift weight onto R LE. Short distance gait ~29ft x 3 with seated rest breaks, CGA, followed closely with chair. Standing dynamic weight shifting at walker with right knee manually blocked to prevent buckling and facilitate stance through R LE.  Pt's fear of falling is biggest limiting factor with progression. He will benefit from short term rehab to regain strength, confidence, and independent functional mobility prior to returning home with family.    Recommendations for follow up therapy are one component of a multi-disciplinary discharge planning process, led by the attending physician.  Recommendations may be updated based on patient status, additional functional criteria and insurance authorization.  Follow Up Recommendations  Skilled nursing-short term rehab (<3 hours/day)     Assistance Recommended at Discharge Frequent or constant Supervision/Assistance  Equipment Recommendations  Rolling walker (2 wheels);BSC/3in1    Recommendations for Other Services       Precautions / Restrictions Precautions Precautions: Fall Precaution Comments: RUE dialysis  fistula Restrictions Weight Bearing Restrictions: Yes RLE Weight Bearing: Weight bearing as tolerated     Mobility  Bed Mobility               General bed mobility comments: pt received sitting EOB & left sitting in recliner    Transfers Overall transfer level: Needs assistance Equipment used: Rolling walker (2 wheels) Transfers: Sit to/from Stand (from recliner) Sit to Stand: Min assist;Mod assist           General transfer comment:  (Pt is hesitant to shift weight to R side during transfers)    Ambulation/Gait Ambulation/Gait assistance: Min assist Gait Distance (Feet): 5 Feet Assistive device: Rolling walker (2 wheels) Gait Pattern/deviations: Decreased step length - left;Decreased stance time - right;Trunk flexed;Wide base of support Gait velocity: significantly decreased   Pre-gait activities:  (Standing at RW, weight shifting L<>R with manual support placed at R knee to prevent buckling and facilitate extension) General Gait Details:  (61ft x 3 with seated rest break, followed closely with chair)   Stairs             Wheelchair Mobility    Modified Rankin (Stroke Patients Only)       Balance                                            Cognition Arousal/Alertness: Awake/alert Behavior During Therapy: WFL for tasks assessed/performed Overall Cognitive Status: Within Functional Limits for tasks assessed  General Comments: Requires encouragement/education for increased participation        Exercises      General Comments General comments (skin integrity, edema, etc.): Pt educated on gait sequencing and importance of weight shifting      Pertinent Vitals/Pain Pain Assessment: 0-10 Pain Score: 5  Pain Location: RLE Pain Descriptors / Indicators: Discomfort;Grimacing Pain Intervention(s): Monitored during session (Pt received Tramodol 1 hour before PT)    Home Living                           Prior Function            PT Goals (current goals can now be found in the care plan section) Acute Rehab PT Goals Patient Stated Goal: get better, go home Progress towards PT goals: Progressing toward goals    Frequency    BID      PT Plan Current plan remains appropriate    Co-evaluation              AM-PAC PT "6 Clicks" Mobility   Outcome Measure  Help needed turning from your back to your side while in a flat bed without using bedrails?: A Little Help needed moving from lying on your back to sitting on the side of a flat bed without using bedrails?: A Little Help needed moving to and from a bed to a chair (including a wheelchair)?: A Little Help needed standing up from a chair using your arms (e.g., wheelchair or bedside chair)?: A Lot Help needed to walk in hospital room?: A Lot Help needed climbing 3-5 steps with a railing? : A Lot 6 Click Score: 15    End of Session Equipment Utilized During Treatment: Gait belt Activity Tolerance: Patient limited by fatigue;Patient limited by pain Patient left: in chair;with chair alarm set;with call bell/phone within reach Nurse Communication: Mobility status PT Visit Diagnosis: Unsteadiness on feet (R26.81);Muscle weakness (generalized) (M62.81);Difficulty in walking, not elsewhere classified (R26.2);Pain Pain - Right/Left: Right Pain - part of body: Hip     Time: 5188-4166 PT Time Calculation (min) (ACUTE ONLY): 40 min  Charges:  $Gait Training: 8-22 mins $Therapeutic Exercise: 8-22 mins $Therapeutic Activity: 8-22 mins                    Mikel Cella, PTA  Josie Dixon 11/05/2021, 1:25 PM

## 2021-11-05 NOTE — Progress Notes (Signed)
PT Cancellation Note  Patient Details Name: Calab Sachse MRN: [id number] DOB: 06/10/1961   Cancelled Treatment:     Therapist in to see pt who declined afternoon session stating he is leaving shortly for [facility].   Josie Dixon 11/05/2021, 3:50 PM

## 2021-11-05 NOTE — Progress Notes (Signed)
PROGRESS NOTE   HPI was taken from Dr. Blaine Hamper: Bobby Sampson is a 60 y.o. male with medical history significant of ESRD-HD (MWF), HTN, HLD, DM, stroke with right sided weakness, GERD, depression, peripheral neuropathy, OSA not using CPAP consistently, dCHF, who presents with a fall and right hip pain.   Patient states that he fell when he was going to bathroom but his knees gave away last night.  He landed on right hip, causing pain in his right hip.  Patient denies loss of consciousness.  He strongly denies any head or neck injury.  He refused CT scan of head and neck.  His right hip pain is constant, severe, sharp, nonradiating. He can not bear weight on right leg. Patient does not have chest pain, cough, shortness breath.  No fever or chills.  Patient states that he had nausea and vomited once after he was given a dose of IV pain medication.  Currently patient does not have nausea, vomiting, diarrhea or abdominal pain.  No symptoms of UTI.  He had dialysis yesterday.  He states that he ate a piece of pie at 0000, and did not take his evening insulin.   Per report, patient had oxygen desaturation initially, put on 2 L oxygen. When I saw patient in the ED, he does not have any shortness of breath.  I took off his nasal cannula oxygen.  He has oxygen saturation 94%.   ED Course: pt was found to have WBC 9.8, negative COVID PCR, potassium 3.8, bicarbonate 30, creatinine 3.76, BUN 22, temperature normal, blood pressure 177/94, heart rate 83, RR 18.  Chest x-ray negative.  X-ray showed right hip fracture.  Patient is admitted to the MedSurg bed as inpatient. Dr. Mack Guise of Ortho and Dr. Theador Hawthorne of renal are consulted.   Hospital course from Dr. Jimmye Norman 12/4-12/6/22: Pt presented w/ closed right hip fracture and s/p intramedullary fixation of right basocervical hip fracture as per ortho surg. PT/OT evaluated the pt initially recommend [facility] but on re-evaluation they recommended SNF. Pt can be d/c tomorrow to  [facility] when bed is available.     Bobby Sampson  OLM:[id number] DOB: 02/09/1961 DOA: 11/02/2021 PCP: [facility]    Assessment & Plan:   Principal Problem:   Closed right hip fracture [facility]) Active Problems:   Essential hypertension   Stroke ([place])   Type II diabetes mellitus with renal manifestations ([place])   Fall   ESRD on dialysis ([place])   Anemia in ESRD (end-stage renal disease) ([place])   GERD (gastroesophageal reflux disease)   Depression   Chronic diastolic CHF (congestive heart failure) (H[facility])   Closed right hip fracture: s/p intramedullary fixation of right basicervical hip fracture. Percocet, morphine & robaxin prn. PT now recs SNF    HTN: continue on BB, CCB. IV hydralazine prn    Hx of CVA: continue on aspirin, statin  DM2: HbA1c 11.0, poorly controlled. Continue on lantus, SSI w/ accuchecks    Fall: PT now recs SNF   ESRD: on HD MWF. Management as per nephro   Hyponatremia: labile. Managed w/ HD   ACD: likely secondary to ESRD. H&H are labile    GERD: continue on PPI    Depression: severity unknown. Continue on home dose of bupropion, fluoxetine   Chronic diastolic CHF: echo on 0/09/711 showed EF > 55% with grade 1 diastolic dysfunction. Appears euvolemic. Volume management w/ HD    DVT prophylaxis: heparin  Code Status: full Family Communication:  Disposition Plan:  d/c tomorrow to SNF   Level of care: Med-Surg  Status is: Inpatient  Remains inpatient appropriate because: d/c tomorrow to SNF when bed is available      Consultants:  Ortho surg   Procedures:   Antimicrobials:   Subjective: Pt c/o hip pain   Objective: Vitals:   11/04/21 1537 11/04/21 2014 11/05/21 0517 11/05/21 0732  BP: 133/70 132/72 132/77 (!) 173/81  Pulse: 94 87 86 86  Resp: 15 20 20 16   Temp: 98.2 F (36.8 C) 98.4 F (36.9 C) 98.4 F (36.9 C) 98.7 F (37.1 C)  TempSrc:   Oral Oral  SpO2: 98% 93% 97% 91%  Weight:      Height:         Intake/Output Summary (Last 24 hours) at 11/05/2021 0811 Last data filed at 11/04/2021 1601 Gross per 24 hour  Intake 100 ml  Output 1739 ml  Net -1639 ml   Filed Weights   11/02/21 0405 11/04/21 0856  Weight: 99 kg 102.3 kg    Examination:  General exam: Appears calm but uncomfortable  Respiratory system: clear breath sounds b/l  Cardiovascular system: S1 & S2+. No rubs or clicks  Gastrointestinal system: Abd is soft, NT, obese & hypoactive bowel sounds  Central nervous system: alert and oriented. Moves all extremities  Psychiatry: judgement and insight appears normal. Flat mood and affect    Data Reviewed: I have personally reviewed following labs and imaging studies  CBC: Recent Labs  Lab 11/02/21 0416 11/03/21 0418 11/04/21 0653 11/05/21 0308  WBC 9.8 11.7* 10.0 8.1  HGB 11.9* 11.4* 10.9* 10.3*  HCT 37.2* 35.4* 33.9* 31.8*  MCV 83.0 83.5 83.3 81.7  PLT 200 201 210 536   Basic Metabolic Panel: Recent Labs  Lab 11/02/21 0416 11/03/21 0418 11/04/21 0653 11/05/21 0308  NA 132* 131* 136 133*  K 3.8 4.8 4.2 3.2*  CL 96* 95* 99 96*  CO2 30 27 28 28   GLUCOSE 416* 329* 237* 207*  BUN 22 32* 50* 40*  CREATININE 3.76* 4.35* 5.52* 4.85*  CALCIUM 8.3* 8.3* 8.9 8.5*  PHOS  --   --   --  4.1   GFR: Estimated Creatinine Clearance: 18.6 mL/min (A) (by C-G formula based on SCr of 4.85 mg/dL (H)). Liver Function Tests: No results for input(s): AST, ALT, ALKPHOS, BILITOT, PROT, ALBUMIN in the last 168 hours. No results for input(s): LIPASE, AMYLASE in the last 168 hours. No results for input(s): AMMONIA in the last 168 hours. Coagulation Profile: Recent Labs  Lab 11/02/21 0916  INR 1.1   Cardiac Enzymes: No results for input(s): CKTOTAL, CKMB, CKMBINDEX, TROPONINI in the last 168 hours. BNP (last 3 results) No results for input(s): PROBNP in the last 8760 hours. HbA1C: No results for input(s): HGBA1C in the last 72 hours. CBG: Recent Labs  Lab  11/04/21 0822 11/04/21 1330 11/04/21 1613 11/04/21 2043 11/05/21 0732  GLUCAP 216* 143* 211* 314* 266*   Lipid Profile: No results for input(s): CHOL, HDL, LDLCALC, TRIG, CHOLHDL, LDLDIRECT in the last 72 hours. Thyroid Function Tests: No results for input(s): TSH, T4TOTAL, FREET4, T3FREE, THYROIDAB in the last 72 hours. Anemia Panel: No results for input(s): VITAMINB12, FOLATE, FERRITIN, TIBC, IRON, RETICCTPCT in the last 72 hours. Sepsis Labs: No results for input(s): PROCALCITON, LATICACIDVEN in the last 168 hours.  Recent Results (from the past 240 hour(s))  Resp Panel by RT-PCR (Flu A&B, Covid) Nasopharyngeal Swab     Status: None   Collection Time: 11/02/21  5:22 AM   Specimen: Nasopharyngeal Swab; Nasopharyngeal(NP) swabs in vial transport medium  Result Value Ref Range Status   SARS Coronavirus 2 by RT PCR NEGATIVE NEGATIVE Final    Comment: (NOTE) SARS-CoV-2 target nucleic acids are NOT DETECTED.  The SARS-CoV-2 RNA is generally detectable in upper respiratory specimens during the acute phase of infection. The lowest concentration of SARS-CoV-2 viral copies this assay can detect is 138 copies/mL. A negative result does not preclude SARS-Cov-2 infection and should not be used as the sole basis for treatment or other patient management decisions. A negative result may occur with  improper specimen collection/handling, submission of specimen other than nasopharyngeal swab, presence of viral mutation(s) within the areas targeted by this assay, and inadequate number of viral copies(<138 copies/mL). A negative result must be combined with clinical observations, patient history, and epidemiological information. The expected result is Negative.  Fact Sheet for Patients:  EntrepreneurPulse.com.au  Fact Sheet for Healthcare Providers:  IncredibleEmployment.be  This test is no t yet approved or cleared by the [place] FDA and  has  been authorized for detection and/or diagnosis of SARS-CoV-2 by FDA under an Emergency Use Authorization (EUA). This EUA will remain  in effect (meaning this test can be used) for the duration of the COVID-19 declaration under Section 564(b)(1) of the Act, 21 U.S.C.section 360bbb-3(b)(1), unless the authorization is terminated  or revoked sooner.       Influenza A by PCR NEGATIVE NEGATIVE Final   Influenza B by PCR NEGATIVE NEGATIVE Final    Comment: (NOTE) The Xpert Xpress SARS-CoV-2/FLU/RSV plus assay is intended as an aid in the diagnosis of influenza from Nasopharyngeal swab specimens and should not be used as a sole basis for treatment. Nasal washings and aspirates are unacceptable for Xpert Xpress SARS-CoV-2/FLU/RSV testing.  Fact Sheet for Patients: EntrepreneurPulse.com.au  Fact Sheet for Healthcare Providers: IncredibleEmployment.be  This test is not yet approved or cleared by the [place] FDA and has been authorized for detection and/or diagnosis of SARS-CoV-2 by FDA under an Emergency Use Authorization (EUA). This EUA will remain in effect (meaning this test can be used) for the duration of the COVID-19 declaration under Section 564(b)(1) of the Act, 21 U.S.C. section 360bbb-3(b)(1), unless the authorization is terminated or revoked.  Performed at [facility], [address] [place], [place] [address]          Radiology Studies: No results found.      Scheduled Meds:  amLODipine  2.5 mg Oral Daily   aspirin EC  81 mg Oral Daily   atorvastatin  80 mg Oral Daily   buPROPion  150 mg Oral Daily   Chlorhexidine Gluconate Cloth  6 each Topical Q0600   cholecalciferol  2,000 Units Oral Daily   docusate sodium  100 mg Oral BID   feeding supplement (NEPRO CARB STEADY)  237 mL Oral TID BM   FLUoxetine  20 mg Oral Daily   heparin injection (subcutaneous)  5,000 Units Subcutaneous Q8H   insulin aspart  0-5  Units Subcutaneous QHS   insulin aspart  0-9 Units Subcutaneous TID WC   insulin glargine-yfgn  14 Units Subcutaneous BID   latanoprost  1 drop Both Eyes QHS   loratadine  10 mg Oral Daily   metoprolol succinate  25 mg Oral Daily   multivitamin  1 tablet Oral QHS   multivitamin with minerals  1 tablet Oral Daily   pantoprazole  40 mg Oral Daily   senna  1 tablet  Oral BID   traMADol  50 mg Oral Q12H   Continuous Infusions:  sodium chloride     sodium chloride     methocarbamol (ROBAXIN) IV       LOS: 3 days    Time spent: 25 mins    Wyvonnia Dusky, MD [facility] Pager [phone]  If 7PM-7AM, please contact night-coverage 11/05/2021, 8:11 AM

## 2021-11-05 NOTE — Care Management Important Message (Signed)
Important Message  Patient Details  Name: Bobby Sampson MRN: [id number] Date of Birth: [birth year]   Medicare Important Message Given:  Yes     [name] 11/05/2021, 12:54 PM

## 2021-11-05 NOTE — TOC Progression Note (Addendum)
Transition of Care [facility]) - Progression Note    Patient Details  Name: Bobby Sampson MRN: [id number] Date of Birth: Jan 09, 1961  Transition of Care [facility]) CM/SW [place], RN Phone Number: 11/05/2021, 1:03 PM  Clinical Narrative:    The patient accepted the bed offer with [facility], I notified Ricky at [place], [place] with [facility] called and stated that they can not provide transportation for dialysis, I reached out to the patient and his wife Bobby Sampson, and notified them of this, [employer] is chosen, I notified Cytogeneticist    Expected Discharge Plan: [place] Barriers to Discharge: Continued Medical Work up  Expected Discharge Plan and Services Expected Discharge Plan: [place]   Discharge Planning Services: CM Consult Post Acute Care Choice: Durable Medical Equipment, Home Health Living arrangements for the past 2 months: Single Family Home                 DME Arranged: 3-N-1 DME Agency: [employer] Date DME Agency Contacted: 11/03/21 Time DME Agency Contacted: [phone] Representative spoke with at DME Agency: jasmine HH Arranged: PT, OT, Nurse's Aide           Social Determinants of Health (SDOH) Interventions    Readmission Risk Interventions No flowsheet data found.

## 2021-11-05 NOTE — Progress Notes (Signed)
[facility]  ROUNDING NOTE   Subjective:   Patient seen resting quietly in bed Currently awaiting breakfast States pain is controlled with pain medication Patient seen later sitting up in chair. Complains of pain with activity.  Objective:  Vital signs in last 24 hours:  Temp:  [98.2 F (36.8 C)-98.9 F (37.2 C)] 98.7 F (37.1 C) (12/06 0732) Pulse Rate:  [86-95] 86 (12/06 0732) Resp:  [15-23] 16 (12/06 0732) BP: (132-175)/(69-83) 173/81 (12/06 0732) SpO2:  [91 %-100 %] 91 % (12/06 0732)  Weight change:  Filed Weights   11/02/21 0405 11/04/21 0856  Weight: 99 kg 102.3 kg    Intake/Output: I/O last 3 completed shifts: In: 100 [P.O.:100] Out: 1739 [Other:1739]   Intake/Output this shift:  No intake/output data recorded.  Physical Exam: General: NAD, resting comfortably  Head: Normocephalic, atraumatic. Moist oral mucosal membranes  Eyes: Anicteric  Lungs:  Clear to auscultation, normal effort  Heart: Regular rate and rhythm  Abdomen:  Soft, nontender  Extremities: Trace peripheral edema.  Neurologic: Nonfocal, moving all four extremities  Skin: No lesions  Access: Right chest PermCath    Basic Metabolic Panel: Recent Labs  Lab 11/02/21 0416 11/03/21 0418 11/04/21 0653 11/05/21 0308  NA 132* 131* 136 133*  K 3.8 4.8 4.2 3.2*  CL 96* 95* 99 96*  CO2 30 27 28 28   GLUCOSE 416* 329* 237* 207*  BUN 22 32* 50* 40*  CREATININE 3.76* 4.35* 5.52* 4.85*  CALCIUM 8.3* 8.3* 8.9 8.5*  PHOS  --   --   --  4.1     Liver Function Tests: No results for input(s): AST, ALT, ALKPHOS, BILITOT, PROT, ALBUMIN in the last 168 hours. No results for input(s): LIPASE, AMYLASE in the last 168 hours. No results for input(s): AMMONIA in the last 168 hours.  CBC: Recent Labs  Lab 11/02/21 0416 11/03/21 0418 11/04/21 0653 11/05/21 0308  WBC 9.8 11.7* 10.0 8.1  HGB 11.9* 11.4* 10.9* 10.3*  HCT 37.2* 35.4* 33.9* 31.8*  MCV 83.0 83.5 83.3 81.7  PLT 200 201  210 187     Cardiac Enzymes: No results for input(s): CKTOTAL, CKMB, CKMBINDEX, TROPONINI in the last 168 hours.  BNP: Invalid input(s): POCBNP  CBG: Recent Labs  Lab 11/04/21 0822 11/04/21 1330 11/04/21 1613 11/04/21 2043 11/05/21 0732  GLUCAP 216* 143* 211* 314* 266*     Microbiology: Results for orders placed or performed during the hospital encounter of 11/02/21  Resp Panel by RT-PCR (Flu A&B, Covid) Nasopharyngeal Swab     Status: None   Collection Time: 11/02/21  5:22 AM   Specimen: Nasopharyngeal Swab; Nasopharyngeal(NP) swabs in vial transport medium  Result Value Ref Range Status   SARS Coronavirus 2 by RT PCR NEGATIVE NEGATIVE Final    Comment: (NOTE) SARS-CoV-2 target nucleic acids are NOT DETECTED.  The SARS-CoV-2 RNA is generally detectable in upper respiratory specimens during the acute phase of infection. The lowest concentration of SARS-CoV-2 viral copies this assay can detect is 138 copies/mL. A negative result does not preclude SARS-Cov-2 infection and should not be used as the sole basis for treatment or other patient management decisions. A negative result may occur with  improper specimen collection/handling, submission of specimen other than nasopharyngeal swab, presence of viral mutation(s) within the areas targeted by this assay, and inadequate number of viral copies(<138 copies/mL). A negative result must be combined with clinical observations, patient history, and epidemiological information. The expected result is Negative.  Fact Sheet for Patients:  EntrepreneurPulse.com.au  Fact Sheet for Healthcare Providers:  IncredibleEmployment.be  This test is no t yet approved or cleared by the [place] FDA and  has been authorized for detection and/or diagnosis of SARS-CoV-2 by FDA under an Emergency Use Authorization (EUA). This EUA will remain  in effect (meaning this test can be used) for the duration  of the COVID-19 declaration under Section 564(b)(1) of the Act, 21 U.S.C.section 360bbb-3(b)(1), unless the authorization is terminated  or revoked sooner.       Influenza A by PCR NEGATIVE NEGATIVE Final   Influenza B by PCR NEGATIVE NEGATIVE Final    Comment: (NOTE) The Xpert Xpress SARS-CoV-2/FLU/RSV plus assay is intended as an aid in the diagnosis of influenza from Nasopharyngeal swab specimens and should not be used as a sole basis for treatment. Nasal washings and aspirates are unacceptable for Xpert Xpress SARS-CoV-2/FLU/RSV testing.  Fact Sheet for Patients: EntrepreneurPulse.com.au  Fact Sheet for Healthcare Providers: IncredibleEmployment.be  This test is not yet approved or cleared by the [place] FDA and has been authorized for detection and/or diagnosis of SARS-CoV-2 by FDA under an Emergency Use Authorization (EUA). This EUA will remain in effect (meaning this test can be used) for the duration of the COVID-19 declaration under Section 564(b)(1) of the Act, 21 U.S.C. section 360bbb-3(b)(1), unless the authorization is terminated or revoked.  Performed at [facility], [address] [place], [place] [address]     Coagulation Studies: No results for input(s): LABPROT, INR in the last 72 hours.   Urinalysis: No results for input(s): COLORURINE, LABSPEC, PHURINE, GLUCOSEU, HGBUR, BILIRUBINUR, KETONESUR, PROTEINUR, UROBILINOGEN, NITRITE, LEUKOCYTESUR in the last 72 hours.  Invalid input(s): APPERANCEUR    Imaging: No results found.   Medications:    sodium chloride     sodium chloride     methocarbamol (ROBAXIN) IV      amLODipine  2.5 mg Oral Daily   aspirin EC  81 mg Oral Daily   atorvastatin  80 mg Oral Daily   buPROPion  150 mg Oral Daily   Chlorhexidine Gluconate Cloth  6 each Topical Q0600   cholecalciferol  2,000 Units Oral Daily   docusate sodium  100 mg Oral BID   feeding supplement  (NEPRO CARB STEADY)  237 mL Oral TID BM   FLUoxetine  20 mg Oral Daily   heparin injection (subcutaneous)  5,000 Units Subcutaneous Q8H   insulin aspart  0-5 Units Subcutaneous QHS   insulin aspart  0-9 Units Subcutaneous TID WC   insulin glargine-yfgn  14 Units Subcutaneous BID   latanoprost  1 drop Both Eyes QHS   loratadine  10 mg Oral Daily   metoprolol succinate  25 mg Oral Daily   multivitamin  1 tablet Oral QHS   multivitamin with minerals  1 tablet Oral Daily   pantoprazole  40 mg Oral Daily   senna  1 tablet Oral BID   traMADol  50 mg Oral Q12H   sodium chloride, sodium chloride, acetaminophen, albuterol, alteplase, alum & mag hydroxide-simeth, bisacodyl, dextromethorphan-guaiFENesin, heparin, hydrALAZINE, HYDROmorphone (DILAUDID) injection, lidocaine (PF), lidocaine-prilocaine, menthol-cetylpyridinium **OR** phenol, methocarbamol **OR** methocarbamol (ROBAXIN) IV, ondansetron **OR** ondansetron (ZOFRAN) IV, oxyCODONE, oxyCODONE, pentafluoroprop-tetrafluoroeth, polyethylene glycol, senna-docusate  Assessment/ Plan:  Mr. Bobby Sampson is a 60 y.o.  male  with medical history significant of ESRD-HD (MWF), HTN, HLD, DM, stroke with right sided weakness, GERD, depression, peripheral neuropathy, OSA not using CPAP consistently, dCHF.  Patient has been admitted for Fall [W19.XXXA] Hyperglycemia [R73.9] Closed  right hip fracture ([place]) [S72.001A] Closed fracture of right hip, initial encounter ([place]) [S72.001A]    End-stage renal disease on dialysis.  Will maintain outpatient schedule if possible.  Received dialysis yesterday, but treatment terminated 20 minutes early due to a clotted dialyzer.  We will heparinize dialyzer for next treatment.  Next treatment scheduled for Wednesday.  2. Anemia of chronic kidney disease Lab Results  Component Value Date   HGB 10.3 (L) 11/05/2021  Hemoglobin at goal  3. Secondary Hyperparathyroidism:  Lab Results  Component Value Date   CALCIUM 8.5 (L)  11/05/2021   PHOS 4.1 11/05/2021  Mircera prescribed every 4 weeks outpatient. Calcium and phosphorus within acceptable range.  4. Diabetes mellitus type II with chronic kidney disease insulin dependent. Home regimen includes Lantus and NovoLog. Most recent hemoglobin A1c is 11.0 on 02/16/21.  Glucose remains elevated  5.  Right hip fracture. Intramedullary fixation of right basicervical hip fracture performed on 11/02/2021.  Out of bed to chair with physical therapy.  Continue pain control      LOS: 3 Shanard Treto 12/6/20229:37 AM

## 2021-11-05 NOTE — TOC Progression Note (Signed)
Transition of Care [facility]) - Progression Note    Patient Details  Name: Bobby Sampson MRN: [id number] Date of Birth: Feb 16, 1961  Transition of Care [facility]) CM/SW [place], RN Phone Number: 11/05/2021, 1:01 PM  Clinical Narrative:   Spoke with the patient's wife Bobby Sampson and offered the bed choices, her and the patient will discuss and make a choice    Expected Discharge Plan: [place] Barriers to Discharge: Continued Medical Work up  Expected Discharge Plan and Services Expected Discharge Plan: [place]   Discharge Planning Services: CM Consult Post Acute Care Choice: Durable Medical Equipment, Home Health Living arrangements for the past 2 months: Single Family Home                 DME Arranged: 3-N-1 DME Agency: [employer] Date DME Agency Contacted: 11/03/21 Time DME Agency Contacted: [phone] Representative spoke with at DME Agency: jasmine HH Arranged: PT, OT, Nurse's Aide           Social Determinants of Health (SDOH) Interventions    Readmission Risk Interventions No flowsheet data found.

## 2021-11-05 NOTE — Progress Notes (Signed)
Nutrition Follow-up  DOCUMENTATION CODES:   Obesity unspecified  INTERVENTION:   -D/c Nepro -Double protein portions with meals -Continue renal MVI daily  NUTRITION DIAGNOSIS:   Increased nutrient needs related to hip fracture as evidenced by estimated needs.  Ongoing  GOAL:   Patient will meet greater than or equal to 90% of their needs  Progressing   MONITOR:   PO intake, Supplement acceptance, Labs, Weight trends, Skin, I & O's  REASON FOR ASSESSMENT:   Consult Hip fracture protocol  ASSESSMENT:   60 y.o. male with medical history significant of ESRD-HD (MWF), HTN, HLD, DM, stroke with right sided weakness, GERD, depression, peripheral neuropathy, OSA not using CPAP consistently and dCHF who is admitted with hip fracture after fall now s/p surgical repair 12/3.  12/3- s/p PROCEDURE: Intramedullary fixation of right basicervical hip fracture  Reviewed I/O's: -1.6 L x 24 hours and -1.1 L since admission  Spoke with pt at bedside. Pt reports feeling better "as long as I don't move". He shares he has a fair appetite, consuming 50-100% of meals. PTA pt reports consuming 2-3 meals per day, but often only eats 2 meals per day after HD days due to queasiness.   Pt reports EDW of 98#. He denies any weight loss.   Per pt, he does not take any supplements PTA. He will discharged to SNF once medically stable.    Medications reviewed and include vitamin D, colace,   Labs reviewed: Na: 133, K: 3.2, CBGS: 143-314 (inpatient orders for glycemic control are 0-5 units insulin aspart daily at bedtime, 0-9 units insulin aspart TID with meals, 14 units insulin glargine-yfgn daily).    NUTRITION - FOCUSED PHYSICAL EXAM:  Flowsheet Row Most Recent Value  Orbital Region No depletion  Upper Arm Region No depletion  Thoracic and Lumbar Region No depletion  Buccal Region No depletion  Temple Region No depletion  Clavicle Bone Region No depletion  Clavicle and Acromion Bone Region  No depletion  Scapular Bone Region No depletion  Dorsal Hand No depletion  Patellar Region No depletion  Anterior Thigh Region No depletion  Posterior Calf Region No depletion  Edema (RD Assessment) Mild  Hair Reviewed  Eyes Reviewed  Mouth Reviewed  Skin Reviewed  Nails Reviewed       Diet Order:   Diet Order             Diet renal/carb modified with fluid restriction Diet-HS Snack? Nothing; Fluid restriction: 1200 mL Fluid; Room service appropriate? Yes; Fluid consistency: Thin  Diet effective now                   EDUCATION NEEDS:   Education needs have been addressed  Skin:  Skin Assessment: Skin Integrity Issues: Skin Integrity Issues:: Incisions DTI: - Incisions: closed rt hip  Last BM:  11/02/21  Height:   Ht Readings from Last 1 Encounters:  11/03/21 5\' 9"  (1.753 m)    Weight:   Wt Readings from Last 1 Encounters:  11/04/21 102.3 kg    Ideal Body Weight:  72.7 kg  BMI:  Body mass index is 33.31 kg/m.  Estimated Nutritional Needs:   Kcal:  2200-2500kcal/day  Protein:  110-125g/day  Fluid:  UOP +1L    Loistine Chance, RD, LDN, [place] Registered Dietitian II Certified Diabetes Care and Education Specialist Please refer to [facility] for RD and/or RD on-call/weekend/after hours pager

## 2021-11-05 NOTE — Progress Notes (Signed)
Subjective:  POD #3 s/p intramedullary fixation for right intertrochanteric hip fracture.   Patient reports right hip pain as mild.  Patient is up out of bed to a chair.  Objective:   VITALS:   Vitals:   11/05/21 0517 11/05/21 0732 11/05/21 1141 11/05/21 1536  BP: 132/77 (!) 173/81 (!) 161/78 (!) 151/75  Pulse: 86 86 87 81  Resp: 20 16 18 17   Temp: 98.4 F (36.9 C) 98.7 F (37.1 C) 98.2 F (36.8 C) 97.7 F (36.5 C)  TempSrc: Oral Oral    SpO2: 97% 91% 99% 95%  Weight:      Height:        PHYSICAL EXAM: Right lower extremity Neurovascular intact Sensation intact distally Intact pulses distally Dorsiflexion/Plantar flexion intact No cellulitis present Compartment soft  LABS  Results for orders placed or performed during the hospital encounter of 11/02/21 (from the past 24 hour(s))  Glucose, capillary     Status: Abnormal   Collection Time: 11/04/21  8:43 PM  Result Value Ref Range   Glucose-Capillary 314 (H) 70 - 99 mg/dL  CBC     Status: Abnormal   Collection Time: 11/05/21  3:08 AM  Result Value Ref Range   WBC 8.1 4.0 - 10.5 K/uL   RBC 3.89 (L) 4.22 - 5.81 MIL/uL   Hemoglobin 10.3 (L) 13.0 - 17.0 g/dL   HCT 31.8 (L) 39.0 - 52.0 %   MCV 81.7 80.0 - 100.0 fL   MCH 26.5 26.0 - 34.0 pg   MCHC 32.4 30.0 - 36.0 g/dL   RDW 14.8 11.5 - 15.5 %   Platelets 187 150 - 400 K/uL   nRBC 0.0 0.0 - 0.2 %  Basic metabolic panel     Status: Abnormal   Collection Time: 11/05/21  3:08 AM  Result Value Ref Range   Sodium 133 (L) 135 - 145 mmol/L   Potassium 3.2 (L) 3.5 - 5.1 mmol/L   Chloride 96 (L) 98 - 111 mmol/L   CO2 28 22 - 32 mmol/L   Glucose, Bld 207 (H) 70 - 99 mg/dL   BUN 40 (H) 8 - 23 mg/dL   Creatinine, Ser 4.85 (H) 0.61 - 1.24 mg/dL   Calcium 8.5 (L) 8.9 - 10.3 mg/dL   GFR, Estimated 13 (L) >60 mL/min   Anion gap 9 5 - 15  Phosphorus     Status: None   Collection Time: 11/05/21  3:08 AM  Result Value Ref Range   Phosphorus 4.1 2.5 - 4.6 mg/dL  Glucose,  capillary     Status: Abnormal   Collection Time: 11/05/21  7:32 AM  Result Value Ref Range   Glucose-Capillary 266 (H) 70 - 99 mg/dL  Glucose, capillary     Status: Abnormal   Collection Time: 11/05/21 11:44 AM  Result Value Ref Range   Glucose-Capillary 239 (H) 70 - 99 mg/dL  Resp Panel by RT-PCR (Flu A&B, Covid) Nasopharyngeal Swab     Status: None   Collection Time: 11/05/21  4:48 PM   Specimen: Nasopharyngeal Swab; Nasopharyngeal(NP) swabs in vial transport medium  Result Value Ref Range   SARS Coronavirus 2 by RT PCR NEGATIVE NEGATIVE   Influenza A by PCR NEGATIVE NEGATIVE   Influenza B by PCR NEGATIVE NEGATIVE  Glucose, capillary     Status: Abnormal   Collection Time: 11/05/21  5:43 PM  Result Value Ref Range   Glucose-Capillary 269 (H) 70 - 99 mg/dL    No results found.  Assessment/Plan: 3  Days Post-Op   Principal Problem:   Closed right hip fracture (HCC) Active Problems:   Essential hypertension   Stroke ([place])   Type II diabetes mellitus with renal manifestations ([place])   Fall   ESRD on dialysis ([place])   Anemia in ESRD (end-stage renal disease) ([place])   GERD (gastroesophageal reflux disease)   Depression   Chronic diastolic CHF (congestive heart failure) ([place])  Patient stable from an orthopedic standpoint.  Continue with physical therapy.  Patient is going to a skilled nursing facility upon discharge.  Patient will follow-up with me in 10 to 14 days for staple removal and x-ray.  Patient is weightbearing as tolerated on the right lower extremity.  I will defer to medicine/nephrology for appropriate anticoagulation therapy upon discharge.    Thornton Park , MD 11/05/2021, 5:56 PM

## 2021-11-05 NOTE — Progress Notes (Signed)
Occupational Therapy Treatment Patient Details Name: Bobby Sampson MRN: [id number] DOB: 1960/12/24 Today's Date: 11/05/2021   History of present illness Pt is a 60 y/o M admitted on 11/02/21 with c/c of fall & R hip pain. Pt was found to have R basicervical/intertrochanteric hip fracture & underwent IM fixation on 11/02/21 by Dr. Mack Guise. PMH: ESRD on HD MWF, HTN, HLD, DM, stroke with R sided weakness, GERD, depression, peripheral neuropathy, OSA not using CPAP consistently, dCHF   OT comments  Pt seen for OT treatment on this date. Upon arrival to room, pt awake and seated upright in recliner. Pt agreeable to OT tx, although endorsing fear of R knee buckling during functional mobility, and requiring encouragement to attempt. Pt required MIN A to walk to/from BSC (~4 ft) with RW and perform BSC transfer d/t decreased balance and RLE pain. After pt's recliner rolled to sink, pt perform x2 sit<>stand transfers, requiring MIN A for steadying when pulling pants over hips and fastening drawstring while standing and MIN GUARD for standing grooming tasks. Pt continues to benefit from skilled OT services to maximize return to PLOF and minimize risk of future falls, injury, caregiver burden, and readmission. Will continue to follow POC. Given level of assist pt currently requires for ADLs transfers pt would be unsafe at this point to discharge home. Discharge recommendation updated to SNF.    Recommendations for follow up therapy are one component of a multi-disciplinary discharge planning process, led by the attending physician.  Recommendations may be updated based on patient status, additional functional criteria and insurance authorization.    Follow Up Recommendations  Skilled nursing-short term rehab (<3 hours/day)    Assistance Recommended at Discharge Frequent or constant Supervision/Assistance  Equipment Recommendations  BSC/3in1;Other (comment) (2ww)       Precautions / Restrictions  Precautions Precautions: Fall Precaution Comments: RUE dialysis fistula Restrictions Weight Bearing Restrictions: Yes RLE Weight Bearing: Weight bearing as tolerated       Mobility Bed Mobility               General bed mobility comments: pt received sitting EOB & left sitting in recliner    Transfers Overall transfer level: Needs assistance Equipment used: Rolling walker (2 wheels) Transfers: Sit to/from Stand Sit to Stand: Min assist             Balance Overall balance assessment: Needs assistance Sitting-balance support: Feet supported;No upper extremity supported Sitting balance-Leahy Scale: Good Sitting balance - Comments: good sitting balance reaching within BOS   Standing balance support: No upper extremity supported;During functional activity Standing balance-Leahy Scale: Fair Standing balance comment: Able to participate in standing grooming tasks with b/l UE, requiring only MIN GUARD                           ADL either performed or assessed with clinical judgement   ADL Overall ADL's : Needs assistance/impaired     Grooming: Wash/dry hands;Wash/dry face;Oral care;Min guard;Standing Grooming Details (indicate cue type and reason): requires MIN GUARD d/t pt reporting R knee buckling when fatigued             Lower Body Dressing: Minimal assistance;Sit to/from stand Lower Body Dressing Details (indicate cue type and reason): MIN A for steadying when pulling pants over hips and fastening drawstring while standing Toilet Transfer: Minimal assistance;Ambulation;BSC/3in1;Rolling walker (2 wheels)           Functional mobility during ADLs: Minimal assistance;Rolling walker (2 wheels) (to  walk to/from BSC (~4 ft))        Cognition Arousal/Alertness: Awake/alert Behavior During Therapy: WFL for tasks assessed/performed Overall Cognitive Status: Within Functional Limits for tasks assessed                                  General Comments: Requires encouragement/education for increased participation                General Comments Pt educated on gait sequencing and importance of weight shifting    Pertinent Vitals/ Pain       Pain Assessment: Faces Pain Score: 5  Faces Pain Scale: Hurts even more Pain Location: RLE Pain Descriptors / Indicators: Tightness;Grimacing Pain Intervention(s): Monitored during session;Repositioned         Frequency  Min 3X/week        Progress Toward Goals  OT Goals(current goals can now be found in the care plan section)  Progress towards OT goals: Progressing toward goals  Acute Rehab OT Goals Patient Stated Goal: to regain independence OT Goal Formulation: With patient Time For Goal Achievement: 11/17/21 Potential to Achieve Goals: Good  Plan Discharge plan needs to be updated;Frequency remains appropriate       AM-PAC OT "6 Clicks" Daily Activity     Outcome Measure   Help from another person eating meals?: None Help from another person taking care of personal grooming?: A Little Help from another person toileting, which includes using toliet, bedpan, or urinal?: A Little Help from another person bathing (including washing, rinsing, drying)?: A Lot Help from another person to put on and taking off regular upper body clothing?: A Little Help from another person to put on and taking off regular lower body clothing?: A Lot 6 Click Score: 17    End of Session Equipment Utilized During Treatment: Gait belt;Rolling walker (2 wheels)  OT Visit Diagnosis: Unsteadiness on feet (R26.81);Muscle weakness (generalized) (M62.81);History of falling (Z91.81)   Activity Tolerance Patient tolerated treatment well   Patient Left in chair;with call bell/phone within reach;with chair alarm set   Nurse Communication Mobility status        Time: 1432-1500 OT Time Calculation (min): 28 min  Charges: OT General Charges $OT Visit: 1 Visit OT  Treatments $Self Care/Home Management : 23-37 mins  Fredirick Maudlin, OTR/L [place]

## 2021-11-06 DIAGNOSIS — S72001A Fracture of unspecified part of neck of right femur, initial encounter for closed fracture: Secondary | ICD-10-CM | POA: Diagnosis not present

## 2021-11-06 LAB — CBC
HCT: 33.2 % — ABNORMAL LOW (ref 39.0–52.0)
Hemoglobin: 10.7 g/dL — ABNORMAL LOW (ref 13.0–17.0)
MCH: 26.4 pg (ref 26.0–34.0)
MCHC: 32.2 g/dL (ref 30.0–36.0)
MCV: 82 fL (ref 80.0–100.0)
Platelets: 206 10*3/uL (ref 150–400)
RBC: 4.05 MIL/uL — ABNORMAL LOW (ref 4.22–5.81)
RDW: 14.6 % (ref 11.5–15.5)
WBC: 8.6 10*3/uL (ref 4.0–10.5)
nRBC: 0 % (ref 0.0–0.2)

## 2021-11-06 LAB — GLUCOSE, CAPILLARY
Glucose-Capillary: 200 mg/dL — ABNORMAL HIGH (ref 70–99)
Glucose-Capillary: 310 mg/dL — ABNORMAL HIGH (ref 70–99)
Glucose-Capillary: 270 mg/dL — ABNORMAL HIGH (ref 70–99)

## 2021-11-06 LAB — BASIC METABOLIC PANEL
Anion gap: 10 (ref 5–15)
BUN: 56 mg/dL — ABNORMAL HIGH (ref 8–23)
CO2: 26 mmol/L (ref 22–32)
Calcium: 8.9 mg/dL (ref 8.9–10.3)
Chloride: 98 mmol/L (ref 98–111)
Creatinine, Ser: 5.4 mg/dL — ABNORMAL HIGH (ref 0.61–1.24)
GFR, Estimated: 11 mL/min — ABNORMAL LOW (ref >60)
Glucose, Bld: 161 mg/dL — ABNORMAL HIGH (ref 70–99)
Potassium: 3.5 mmol/L (ref 3.5–5.1)
Sodium: 134 mmol/L — ABNORMAL LOW (ref 135–145)

## 2021-11-06 MED ORDER — TRAMADOL HCL 50 MG PO TABS: 50.0000 mg | ORAL_TABLET | Freq: Four times a day (QID) | ORAL | 0 refills | Status: AC | PRN

## 2021-11-06 MED ORDER — METOPROLOL SUCCINATE ER 25 MG PO TB24
25.0000 mg | ORAL_TABLET | Freq: Once | ORAL | Status: AC
  Administered 2021-11-06: 25 mg via ORAL
  Filled 2021-11-06: qty 1

## 2021-11-06 MED ORDER — ASPIRIN EC 81 MG PO TBEC: 81.0000 mg | DELAYED_RELEASE_TABLET | Freq: Every day | ORAL | Status: DC

## 2021-11-06 MED ORDER — AMLODIPINE BESYLATE 10 MG PO TABS
10.0000 mg | ORAL_TABLET | Freq: Every day | ORAL | Status: DC
Start: 2021-11-06 — End: 2022-01-27

## 2021-11-06 MED ORDER — ASPIRIN EC 325 MG PO TBEC: 325.0000 mg | DELAYED_RELEASE_TABLET | Freq: Every day | ORAL | Status: DC

## 2021-11-06 MED ORDER — HEPARIN SODIUM (PORCINE) 1000 UNIT/ML IJ SOLN
INTRAMUSCULAR | Status: AC
  Filled 2021-11-06: qty 10

## 2021-11-06 MED ORDER — POLYETHYLENE GLYCOL 3350 17 G PO PACK: 17.0000 g | PACK | Freq: Every day | ORAL | 0 refills | Status: DC

## 2021-11-06 MED ORDER — RENA-VITE PO TABS: 1.0000 | ORAL_TABLET | Freq: Every day | ORAL | 0 refills | Status: AC

## 2021-11-06 MED ORDER — POLYETHYLENE GLYCOL 3350 17 G PO PACK
17.0000 g | PACK | Freq: Two times a day (BID) | ORAL | Status: DC
  Administered 2021-11-06 (×2): 17 g via ORAL
  Filled 2021-11-06 (×2): qty 1

## 2021-11-06 MED ORDER — METOPROLOL SUCCINATE ER 25 MG PO TB24
50.0000 mg | ORAL_TABLET | Freq: Every day | ORAL | Status: DC
Start: 2021-11-06 — End: 2022-12-08

## 2021-11-06 MED ORDER — ASPIRIN EC 81 MG PO TBEC: DELAYED_RELEASE_TABLET | ORAL | Status: DC

## 2021-11-06 MED ORDER — AMLODIPINE BESYLATE 5 MG PO TABS
7.5000 mg | ORAL_TABLET | Freq: Once | ORAL | Status: AC
  Administered 2021-11-06: 7.5 mg via ORAL
  Filled 2021-11-06: qty 2

## 2021-11-06 NOTE — Progress Notes (Signed)
Subjective:  POD #4 s/p intramedullary fixation for right intertrochanteric hip fracture.   Patient reports right hip pain as mild.  Patient awaiting dialysis.  Objective:   VITALS:   Vitals:   11/05/21 2045 11/06/21 0435 11/06/21 0802 11/06/21 1154  BP: (!) 151/79 (!) 161/84 (!) 168/87 (!) 176/87  Pulse: 81 87 87 83  Resp: 18 18 16 16   Temp: (!) 97.5 F (36.4 C) 98.1 F (36.7 C) 98 F (36.7 C) (!) 97.5 F (36.4 C)  TempSrc:   Oral   SpO2: 99% 99% 98% 99%  Weight:      Height:        PHYSICAL EXAM: Right lower extremity Neurovascular intact Sensation intact distally Intact pulses distally Dorsiflexion/Plantar flexion intact No cellulitis present Compartment soft  LABS  Results for orders placed or performed during the hospital encounter of 11/02/21 (from the past 24 hour(s))  Resp Panel by RT-PCR (Flu A&B, Covid) Nasopharyngeal Swab     Status: None   Collection Time: 11/05/21  4:48 PM   Specimen: Nasopharyngeal Swab; Nasopharyngeal(NP) swabs in vial transport medium  Result Value Ref Range   SARS Coronavirus 2 by RT PCR NEGATIVE NEGATIVE   Influenza A by PCR NEGATIVE NEGATIVE   Influenza B by PCR NEGATIVE NEGATIVE  Glucose, capillary     Status: Abnormal   Collection Time: 11/05/21  5:43 PM  Result Value Ref Range   Glucose-Capillary 269 (H) 70 - 99 mg/dL  Glucose, capillary     Status: Abnormal   Collection Time: 11/05/21 10:35 PM  Result Value Ref Range   Glucose-Capillary 214 (H) 70 - 99 mg/dL   Comment 1 Notify RN   CBC     Status: Abnormal   Collection Time: 11/06/21  2:07 AM  Result Value Ref Range   WBC 8.6 4.0 - 10.5 K/uL   RBC 4.05 (L) 4.22 - 5.81 MIL/uL   Hemoglobin 10.7 (L) 13.0 - 17.0 g/dL   HCT 33.2 (L) 39.0 - 52.0 %   MCV 82.0 80.0 - 100.0 fL   MCH 26.4 26.0 - 34.0 pg   MCHC 32.2 30.0 - 36.0 g/dL   RDW 14.6 11.5 - 15.5 %   Platelets 206 150 - 400 K/uL   nRBC 0.0 0.0 - 0.2 %  Basic metabolic panel     Status: Abnormal   Collection Time:  11/06/21  2:07 AM  Result Value Ref Range   Sodium 134 (L) 135 - 145 mmol/L   Potassium 3.5 3.5 - 5.1 mmol/L   Chloride 98 98 - 111 mmol/L   CO2 26 22 - 32 mmol/L   Glucose, Bld 161 (H) 70 - 99 mg/dL   BUN 56 (H) 8 - 23 mg/dL   Creatinine, Ser 5.40 (H) 0.61 - 1.24 mg/dL   Calcium 8.9 8.9 - 10.3 mg/dL   GFR, Estimated 11 (L) >60 mL/min   Anion gap 10 5 - 15  Glucose, capillary     Status: Abnormal   Collection Time: 11/06/21  8:02 AM  Result Value Ref Range   Glucose-Capillary 200 (H) 70 - 99 mg/dL   Comment 1 Notify RN    Comment 2 Document in Chart   Glucose, capillary     Status: Abnormal   Collection Time: 11/06/21 11:54 AM  Result Value Ref Range   Glucose-Capillary 310 (H) 70 - 99 mg/dL   Comment 1 Notify RN    Comment 2 Document in Chart     No results found.  Assessment/Plan:  4 Days Post-Op   Principal Problem:   Closed right hip fracture (HCC) Active Problems:   Essential hypertension   Stroke ([place])   Type II diabetes mellitus with renal manifestations ([place])   Fall   ESRD on dialysis ([place])   Anemia in ESRD (end-stage renal disease) ([place])   GERD (gastroesophageal reflux disease)   Depression   Chronic diastolic CHF (congestive heart failure) ([place])  Patient stable from orthopedic standpoint.  Patient expressed concern going to the skilled nursing facility she has been approved for due to [place] concerns.  Patient would prefer to go home now.  I will have case management discussed this with him.  Nephrology has recommended the patient be discharged on 81 mg of aspirin daily for DVT prophylaxis x6 weeks given his kidney disease.  Patient will follow-up with me in 10 to 14 days for wound check and staple removal.  Patient will continue with therapy while he is an inpatient and will likely need home health physical therapy if he goes home.   Thornton Park , MD 11/06/2021, 2:11 PM

## 2021-11-06 NOTE — Progress Notes (Signed)
Inpatient Diabetes Program Recommendations  AACE/ADA: New Consensus Statement on Inpatient Glycemic Control (2015)  Target Ranges:  Prepandial:   less than 140 mg/dL      Peak postprandial:   less than 180 mg/dL (1-2 hours)      Critically ill patients:  140 - 180 mg/dL   Lab Results  Component Value Date   GLUCAP 310 (H) 11/06/2021   HGBA1C 11.0 (H) 02/16/2021    Review of Glycemic Control  Latest Reference Range & Units 11/05/21 07:32 11/05/21 11:44 11/05/21 17:43 11/05/21 22:35 11/06/21 08:02 11/06/21 11:54  Glucose-Capillary 70 - 99 mg/dL 266 (H) 239 (H) 269 (H) 214 (H) 200 (H) 310 (H)  (H): Data is abnormally high Diabetes history: DM2 Outpatient Diabetes medications: Lantus 20 units BID, Novolog 10 units TID Current orders for Inpatient glycemic control: Novolog 0-9 units TID and 0-5 units QHS, Semglee 14 units BID  Inpatient Diabetes Program Recommendations:   Fasting 200 and also postprandial CBGs elevated >180. Please consider: -Increase Semglee to 16 units bid -Add Novolog 3 units tid meal coverage if eats 50% meal Secure chat sent to Dr. Billie Ruddy.  Thank you, Nani Gasser. Evamarie Raetz, RN, MSN, CDE  Diabetes Coordinator Inpatient Glycemic Control Team Team Pager [phone] (8am-5pm) 11/06/2021 12:13 PM

## 2021-11-06 NOTE — Plan of Care (Signed)

## 2021-11-06 NOTE — Progress Notes (Signed)
[facility]  ROUNDING NOTE   Subjective:   Patient seen sitting up in chair Pain controlled with current medication Possible d/c to rehab today after dialysis   Objective:  Vital signs in last 24 hours:  Temp:  [97.5 F (36.4 C)-98.2 F (36.8 C)] 98 F (36.7 C) (12/07 0802) Pulse Rate:  [81-87] 87 (12/07 0802) Resp:  [16-18] 16 (12/07 0802) BP: (151-168)/(75-87) 168/87 (12/07 0802) SpO2:  [95 %-99 %] 98 % (12/07 0802)  Weight change:  Filed Weights   11/02/21 0405 11/04/21 0856  Weight: 99 kg 102.3 kg    Intake/Output: No intake/output data recorded.   Intake/Output this shift:  No intake/output data recorded.  Physical Exam: General: NAD, sitting in chair  Head: Normocephalic, atraumatic. Moist oral mucosal membranes  Eyes: Anicteric  Lungs:  Clear to auscultation, normal effort  Heart: Regular rate and rhythm  Abdomen:  Soft, nontender  Extremities: Trace peripheral edema.  Neurologic: Nonfocal, moving all four extremities  Skin: No lesions  Access: Right chest PermCath    Basic Metabolic Panel: Recent Labs  Lab 11/02/21 0416 11/03/21 0418 11/04/21 0653 11/05/21 0308 11/06/21 0207  NA 132* 131* 136 133* 134*  K 3.8 4.8 4.2 3.2* 3.5  CL 96* 95* 99 96* 98  CO2 30 27 28 28 26   GLUCOSE 416* 329* 237* 207* 161*  BUN 22 32* 50* 40* 56*  CREATININE 3.76* 4.35* 5.52* 4.85* 5.40*  CALCIUM 8.3* 8.3* 8.9 8.5* 8.9  PHOS  --   --   --  4.1  --      Liver Function Tests: No results for input(s): AST, ALT, ALKPHOS, BILITOT, PROT, ALBUMIN in the last 168 hours. No results for input(s): LIPASE, AMYLASE in the last 168 hours. No results for input(s): AMMONIA in the last 168 hours.  CBC: Recent Labs  Lab 11/02/21 0416 11/03/21 0418 11/04/21 0653 11/05/21 0308 11/06/21 0207  WBC 9.8 11.7* 10.0 8.1 8.6  HGB 11.9* 11.4* 10.9* 10.3* 10.7*  HCT 37.2* 35.4* 33.9* 31.8* 33.2*  MCV 83.0 83.5 83.3 81.7 82.0  PLT 200 201 210 187 206      Cardiac Enzymes: No results for input(s): CKTOTAL, CKMB, CKMBINDEX, TROPONINI in the last 168 hours.  BNP: Invalid input(s): POCBNP  CBG: Recent Labs  Lab 11/05/21 0732 11/05/21 1144 11/05/21 1743 11/05/21 2235 11/06/21 0802  GLUCAP 266* 239* 269* 214* 200*     Microbiology: Results for orders placed or performed during the hospital encounter of 11/02/21  Resp Panel by RT-PCR (Flu A&B, Covid) Nasopharyngeal Swab     Status: None   Collection Time: 11/02/21  5:22 AM   Specimen: Nasopharyngeal Swab; Nasopharyngeal(NP) swabs in vial transport medium  Result Value Ref Range Status   SARS Coronavirus 2 by RT PCR NEGATIVE NEGATIVE Final    Comment: (NOTE) SARS-CoV-2 target nucleic acids are NOT DETECTED.  The SARS-CoV-2 RNA is generally detectable in upper respiratory specimens during the acute phase of infection. The lowest concentration of SARS-CoV-2 viral copies this assay can detect is 138 copies/mL. A negative result does not preclude SARS-Cov-2 infection and should not be used as the sole basis for treatment or other patient management decisions. A negative result may occur with  improper specimen collection/handling, submission of specimen other than nasopharyngeal swab, presence of viral mutation(s) within the areas targeted by this assay, and inadequate number of viral copies(<138 copies/mL). A negative result must be combined with clinical observations, patient history, and epidemiological information. The expected result is Negative.  Fact Sheet for Patients:  EntrepreneurPulse.com.au  Fact Sheet for Healthcare Providers:  IncredibleEmployment.be  This test is no t yet approved or cleared by the [place] FDA and  has been authorized for detection and/or diagnosis of SARS-CoV-2 by FDA under an Emergency Use Authorization (EUA). This EUA will remain  in effect (meaning this test can be used) for the duration of  the COVID-19 declaration under Section 564(b)(1) of the Act, 21 U.S.C.section 360bbb-3(b)(1), unless the authorization is terminated  or revoked sooner.       Influenza A by PCR NEGATIVE NEGATIVE Final   Influenza B by PCR NEGATIVE NEGATIVE Final    Comment: (NOTE) The Xpert Xpress SARS-CoV-2/FLU/RSV plus assay is intended as an aid in the diagnosis of influenza from Nasopharyngeal swab specimens and should not be used as a sole basis for treatment. Nasal washings and aspirates are unacceptable for Xpert Xpress SARS-CoV-2/FLU/RSV testing.  Fact Sheet for Patients: EntrepreneurPulse.com.au  Fact Sheet for Healthcare Providers: IncredibleEmployment.be  This test is not yet approved or cleared by the [place] FDA and has been authorized for detection and/or diagnosis of SARS-CoV-2 by FDA under an Emergency Use Authorization (EUA). This EUA will remain in effect (meaning this test can be used) for the duration of the COVID-19 declaration under Section 564(b)(1) of the Act, 21 U.S.C. section 360bbb-3(b)(1), unless the authorization is terminated or revoked.  Performed at [facility], [address] [place], [place] [address]   Resp Panel by RT-PCR (Flu A&B, Covid) Nasopharyngeal Swab     Status: None   Collection Time: 11/05/21  4:48 PM   Specimen: Nasopharyngeal Swab; Nasopharyngeal(NP) swabs in vial transport medium  Result Value Ref Range Status   SARS Coronavirus 2 by RT PCR NEGATIVE NEGATIVE Final    Comment: (NOTE) SARS-CoV-2 target nucleic acids are NOT DETECTED.  The SARS-CoV-2 RNA is generally detectable in upper respiratory specimens during the acute phase of infection. The lowest concentration of SARS-CoV-2 viral copies this assay can detect is 138 copies/mL. A negative result does not preclude SARS-Cov-2 infection and should not be used as the sole basis for treatment or other patient management decisions. A  negative result may occur with  improper specimen collection/handling, submission of specimen other than nasopharyngeal swab, presence of viral mutation(s) within the areas targeted by this assay, and inadequate number of viral copies(<138 copies/mL). A negative result must be combined with clinical observations, patient history, and epidemiological information. The expected result is Negative.  Fact Sheet for Patients:  EntrepreneurPulse.com.au  Fact Sheet for Healthcare Providers:  IncredibleEmployment.be  This test is no t yet approved or cleared by the [place] FDA and  has been authorized for detection and/or diagnosis of SARS-CoV-2 by FDA under an Emergency Use Authorization (EUA). This EUA will remain  in effect (meaning this test can be used) for the duration of the COVID-19 declaration under Section 564(b)(1) of the Act, 21 U.S.C.section 360bbb-3(b)(1), unless the authorization is terminated  or revoked sooner.       Influenza A by PCR NEGATIVE NEGATIVE Final   Influenza B by PCR NEGATIVE NEGATIVE Final    Comment: (NOTE) The Xpert Xpress SARS-CoV-2/FLU/RSV plus assay is intended as an aid in the diagnosis of influenza from Nasopharyngeal swab specimens and should not be used as a sole basis for treatment. Nasal washings and aspirates are unacceptable for Xpert Xpress SARS-CoV-2/FLU/RSV testing.  Fact Sheet for Patients: EntrepreneurPulse.com.au  Fact Sheet for Healthcare Providers: IncredibleEmployment.be  This test is not yet  approved or cleared by the [place] and has been authorized for detection and/or diagnosis of SARS-CoV-2 by FDA under an Emergency Use Authorization (EUA). This EUA will remain in effect (meaning this test can be used) for the duration of the COVID-19 declaration under Section 564(b)(1) of the Act, 21 U.S.C. section 360bbb-3(b)(1), unless the authorization  is terminated or revoked.  Performed at [facility], [address] [place], [place] [address]     Coagulation Studies: No results for input(s): LABPROT, INR in the last 72 hours.   Urinalysis: No results for input(s): COLORURINE, LABSPEC, PHURINE, GLUCOSEU, HGBUR, BILIRUBINUR, KETONESUR, PROTEINUR, UROBILINOGEN, NITRITE, LEUKOCYTESUR in the last 72 hours.  Invalid input(s): APPERANCEUR    Imaging: No results found.   Medications:    sodium chloride     sodium chloride     methocarbamol (ROBAXIN) IV      amLODipine  2.5 mg Oral Daily   aspirin EC  81 mg Oral Daily   atorvastatin  80 mg Oral Daily   buPROPion  150 mg Oral Daily   Chlorhexidine Gluconate Cloth  6 each Topical Q0600   cholecalciferol  2,000 Units Oral Daily   docusate sodium  100 mg Oral BID   FLUoxetine  20 mg Oral Daily   heparin injection (subcutaneous)  5,000 Units Subcutaneous Q8H   insulin aspart  0-5 Units Subcutaneous QHS   insulin aspart  0-9 Units Subcutaneous TID WC   insulin glargine-yfgn  14 Units Subcutaneous BID   latanoprost  1 drop Both Eyes QHS   loratadine  10 mg Oral Daily   metoprolol succinate  25 mg Oral Daily   multivitamin  1 tablet Oral QHS   multivitamin with minerals  1 tablet Oral Daily   pantoprazole  40 mg Oral Daily   senna  1 tablet Oral BID   traMADol  50 mg Oral Q12H   sodium chloride, sodium chloride, acetaminophen, albuterol, alteplase, alum & mag hydroxide-simeth, bisacodyl, dextromethorphan-guaiFENesin, heparin, hydrALAZINE, lidocaine (PF), lidocaine-prilocaine, menthol-cetylpyridinium **OR** phenol, methocarbamol **OR** methocarbamol (ROBAXIN) IV, ondansetron **OR** ondansetron (ZOFRAN) IV, oxyCODONE, oxyCODONE, pentafluoroprop-tetrafluoroeth, polyethylene glycol, senna-docusate  Assessment/ Plan:  Mr. Bobby Sampson is a 60 y.o.  male  with medical history significant of ESRD-HD (MWF), HTN, HLD, DM, stroke with right sided weakness, GERD, depression,  peripheral neuropathy, OSA not using CPAP consistently, dCHF.  Patient has been admitted for Fall [W19.XXXA] Hyperglycemia [R73.9] Closed right hip fracture ([place]) [S72.001A] Closed fracture of right hip, initial encounter ([place]) [S72.001A]    End-stage renal disease on dialysis.  Plan for dialysis later today. Will heparinize dialyzer to prevent clotting.  Next treatment scheduled for Wednesday.  2. Anemia of chronic kidney disease Lab Results  Component Value Date   HGB 10.7 (L) 11/06/2021  Hgb remains at goal   3. Secondary Hyperparathyroidism:  Lab Results  Component Value Date   CALCIUM 8.9 11/06/2021   PHOS 4.1 11/05/2021  Mircera prescribed every 4 weeks outpatient. Will continue to monitor bone minerals during this admission.   4. Diabetes mellitus type II with chronic kidney disease insulin dependent. Home regimen includes Lantus and NovoLog. Most recent hemoglobin A1c is 11.0 on 02/16/21.  Glucose remains elevated  5.  Right hip fracture. Intramedullary fixation of right basicervical hip fracture performed on 11/02/2021.  Out of bed to chair with physical therapy.        LOS: 4 Jannatul Wojdyla 12/7/202211:06 AM

## 2021-11-06 NOTE — Progress Notes (Signed)
Report called to [facility] Charlie L. LPN. Will continue to monitor patient until transport

## 2021-11-06 NOTE — Discharge Summary (Addendum)
Physician Discharge Summary   Bobby Sampson  male DOB: [birth year]  MVE:[id number]  PCP: [facility]  Admit date: 11/02/2021 Discharge date: 11/06/2021  Admitted From: home Disposition:  SNF CODE STATUS: Full code  Discharge Instructions     No wound care   Complete by: As directed         Hospital Course:  For full details, please see H&P, progress notes, consult notes and ancillary notes.  Briefly,  Bobby Sampson is a 60 y.o. male with medical history significant of ESRD-HD (MWF), HTN, HLD, DM, stroke with right sided weakness, depression, peripheral neuropathy, OSA not using CPAP consistently, dCHF, who presented with a fall and right hip pain.  Closed right hip fracture:  s/p intramedullary fixation of right intertrochanteric hip fracture.  --follow-up with ortho in 10 to 14 days for staple removal and x-ray.   --Patient is weightbearing as tolerated on the right lower extremity. --after discussion with nephrology and ortho, nephro rec no additional anticoagulation other than home ASA 81 mg daily for DVT ppx due to his ESRD status.   HTN:  --BP elevated on home amlodipine and Toprol.  Pt was not taking Losartan, coreg, or torsemide PTA. --pt discharged on increased dose of amlodipine (from 2.5 mg to 10 mg daily) and Toprol (from 25 to 50 mg daily).   Hx of CVA:  continue statin and home ASA 81 mg daily   DM2: HbA1c 11.0, poorly controlled.  --discharge on home insulin regimen as below.   ESRD: on HD MWF.  --iHD per nephrology   Hyponatremia, mild  ACD: likely secondary to ESRD.  H&H are stable around 10's.   GERD:  continue on PPI    Depression: severity unknown.  Continue on home dose of bupropion, fluoxetine    Chronic diastolic CHF:  echo on 2/83/6629 showed EF > 55% with grade 1 diastolic dysfunction. Appears euvolemic. Volume management w/ HD    Discharge Diagnoses:  Principal Problem:   Closed right hip fracture ([place]) Active  Problems:   Essential hypertension   Stroke ([place])   Type II diabetes mellitus with renal manifestations ([place])   Fall   ESRD on dialysis ([place])   Anemia in ESRD (end-stage renal disease) ([place])   GERD (gastroesophageal reflux disease)   Depression   Chronic diastolic CHF (congestive heart failure) ([place])   30 Day Unplanned Readmission Risk Score    Flowsheet Row ED to Hosp-Admission (Current) from 11/02/2021 in [place] (1A)  30 Day Unplanned Readmission Risk Score (%) 25.21 Filed at 11/06/2021 0801       This score is the patient's risk of an unplanned readmission within 30 days of being discharged (0 -100%). The score is based on dignosis, age, lab data, medications, orders, and past utilization.   Low:  0-14.9   Medium: 15-21.9   High: 22-29.9   Extreme: 30 and above         Discharge Instructions:  Allergies as of 11/06/2021       Reactions   Liraglutide Nausea And Vomiting   Other reaction(s): Nausea and vomiting, Fatigue   Trulicity [dulaglutide] Nausea And Vomiting        Medication List     STOP taking these medications    Calcium Acetate 667 MG Tabs   carvedilol 6.25 MG tablet Commonly known as: COREG   escitalopram 20 MG tablet Commonly known as: LEXAPRO   losartan 25 MG tablet Commonly known as: COZAAR   midodrine  2.5 MG tablet Commonly known as: PROAMATINE   multivitamin with minerals Tabs tablet   torsemide 20 MG tablet Commonly known as: DEMADEX   traZODone 50 MG tablet Commonly known as: DESYREL       TAKE these medications    amLODipine 10 MG tablet Commonly known as: NORVASC Take 1 tablet (10 mg total) by mouth daily. What changed:  medication strength how much to take   aspirin EC 81 MG tablet Take 1 tablet (81 mg total) by mouth daily.   atorvastatin 80 MG tablet Commonly known as: LIPITOR Take 80 mg by mouth daily.   buPROPion 150 MG 24 hr tablet Commonly known as: WELLBUTRIN  XL Take 150 mg by mouth daily.   cholecalciferol 25 MCG (1000 UNIT) tablet Commonly known as: VITAMIN D Take 2,000 Units by mouth daily.   FLUoxetine 20 MG capsule Commonly known as: PROZAC Take 20 mg by mouth daily.   Lantus SoloStar 100 UNIT/ML Solostar Pen Generic drug: insulin glargine Inject 20 Units into the skin every 12 (twelve) hours.   latanoprost 0.005 % ophthalmic solution Commonly known as: XALATAN Place 1 drop into both eyes at bedtime.   loratadine 10 MG tablet Commonly known as: CLARITIN Take 10 mg by mouth daily.   metoprolol succinate 25 MG 24 hr tablet Commonly known as: TOPROL-XL Take 2 tablets (50 mg total) by mouth daily. What changed: how much to take   multivitamin Tabs tablet Take 1 tablet by mouth at bedtime.   NovoLOG FlexPen 100 UNIT/ML FlexPen Generic drug: insulin aspart Inject 10 Units into the skin 3 (three) times daily with meals.   ondansetron 8 MG disintegrating tablet Commonly known as: ZOFRAN-ODT Take 8 mg by mouth every 8 (eight) hours as needed for nausea.   pantoprazole 40 MG tablet Commonly known as: Protonix Take 1 tablet (40 mg total) by mouth daily.   polyethylene glycol 17 g packet Commonly known as: MIRALAX / GLYCOLAX Take 17 g by mouth daily.   traMADol 50 MG tablet Commonly known as: ULTRAM Take 1 tablet (50 mg total) by mouth every 6 (six) hours as needed for up to 5 days.         Contact information for follow-up providers     Thornton Park, MD Follow up on 11/18/2021.   Specialty: Orthopedic Surgery Why: follow-up in 10 to 14 days for staple removal and x-ray. at [address] information: [place] [address] [place], [facility] Follow up.   Contact information: [address] [facility] [address] [facility] [address] [place] [address] [phone]              Contact information for after-discharge care     Destination     [facility] .   Service: Skilled Nursing Contact information: [place] [address] [phone]                     Allergies  Allergen Reactions   Liraglutide Nausea And Vomiting    Other reaction(s): Nausea and vomiting, Fatigue   Trulicity [Dulaglutide] Nausea And Vomiting     The results of significant diagnostics from this hospitalization (including imaging, microbiology, ancillary and laboratory) are listed below for reference.   Consultations:   Procedures/Studies: DG Chest 1 View  Result Date: 11/02/2021 CLINICAL DATA:  60 year old male status post fall with right hip fracture. EXAM: CHEST  1  VIEW COMPARISON:  Portable chest 11/07/2019. FINDINGS: Portable AP supine view at 0444 hours. Right chest dual lumen dialysis type catheter in place. Normal lung volumes and mediastinal contours. Visualized tracheal air column is within normal limits. Allowing for portable technique the lungs are clear. No acute osseous abnormality identified. Negative visible bowel gas. IMPRESSION: Right chest dialysis type catheter. Otherwise Negative portable chest. Electronically Signed   By: Genevie Ann M.D.   On: 11/02/2021 05:25   CT HIP RIGHT WO CONTRAST  Result Date: 11/02/2021 CLINICAL DATA:  Right hip fracture EXAM: CT OF THE RIGHT HIP WITHOUT CONTRAST 3D ACQUISITION WORKSTATION RECONSTRUCTION TECHNIQUE: Multidetector CT imaging of the right hip was performed according to the standard protocol. Multiplanar CT image reconstructions were also generated. Three-dimensional reconstructions of the right hip and associated fracture were performed on the acquisition workstation. COMPARISON:  Radiographs 11/02/2021 FINDINGS: Bones/Joint/Cartilage Somewhat vertical fracture through the right femoral neck involves the transcervical region superiorly but extends to the basicervical region inferiorly. The fracture appears to be intra-articular and no true  intertrochanteric component is identified. 28 degrees of apex anterior angulation as measured on image 43 series 2. There is also some mild varus angulation and some minimal comminution of the fracture inferiorly. No regional pelvic fracture. Small bone island in the right ischium. Ligaments Suboptimally assessed by CT. Muscles and Tendons Aside from mild indistinctness of fascia planes in the immediate vicinity of the fracture, regional musculotendinous structures appear unremarkable. Trace edema anterior to the right iloipsoas muscle for example on image 33 series 3. Soft tissues Arterial atherosclerosis. Calcification of the vas deferens as can be encountered in the setting of diabetes. Mild chondrocalcinosis in the pubic symphysis. IMPRESSION: 1. Right femoral neck fracture involving the transcervical region superiorly and extending to the basicervical region inferiorly. The fracture appears to be intra-articular without substantial trochanteric component. 20 degrees of apex anterior angulation along with some mild varus angulation. Minimal comminution inferiorly. 2. Atherosclerosis. Electronically Signed   By: Van Clines M.D.   On: 11/02/2021 11:21   CT 3D RECON AT SCANNER  Result Date: 11/02/2021 CLINICAL DATA:  Right hip fracture EXAM: CT OF THE RIGHT HIP WITHOUT CONTRAST 3D ACQUISITION WORKSTATION RECONSTRUCTION TECHNIQUE: Multidetector CT imaging of the right hip was performed according to the standard protocol. Multiplanar CT image reconstructions were also generated. Three-dimensional reconstructions of the right hip and associated fracture were performed on the acquisition workstation. COMPARISON:  Radiographs 11/02/2021 FINDINGS: Bones/Joint/Cartilage Somewhat vertical fracture through the right femoral neck involves the transcervical region superiorly but extends to the basicervical region inferiorly. The fracture appears to be intra-articular and no true intertrochanteric component is  identified. 28 degrees of apex anterior angulation as measured on image 43 series 2. There is also some mild varus angulation and some minimal comminution of the fracture inferiorly. No regional pelvic fracture. Small bone island in the right ischium. Ligaments Suboptimally assessed by CT. Muscles and Tendons Aside from mild indistinctness of fascia planes in the immediate vicinity of the fracture, regional musculotendinous structures appear unremarkable. Trace edema anterior to the right iloipsoas muscle for example on image 33 series 3. Soft tissues Arterial atherosclerosis. Calcification of the vas deferens as can be encountered in the setting of diabetes. Mild chondrocalcinosis in the pubic symphysis. IMPRESSION: 1. Right femoral neck fracture involving the transcervical region superiorly and extending to the basicervical region inferiorly. The fracture appears to be intra-articular without substantial trochanteric component. 20 degrees of apex anterior angulation along with some mild varus angulation. Minimal  comminution inferiorly. 2. Atherosclerosis. Electronically Signed   By: Van Clines M.D.   On: 11/02/2021 11:21   DG C-Arm 1-60 Min-No Report  Result Date: 11/02/2021 CLINICAL DATA:  Intramedullary nail fixation, femoral neck fracture EXAM: DG HIP (WITH OR WITHOUT PELVIS) 2-3V RIGHT; DG C-ARM 1-60 MIN-NO REPORT COMPARISON:  CT right hip, 11/02/2021, 10:07 a.m. FLUOROSCOPY TIME:  1:21 FINDINGS: Intraoperative fluoroscopic images of the right hip demonstrate intramedullary nail fixation of transcervical fracture of the right femoral neck. Postoperative radiographs demonstrate near anatomic alignment of fracture with expected overlying change and no perihardware fracture. IMPRESSION: 1. Intraoperative fluoroscopic images of the right hip demonstrate intramedullary nail fixation of transcervical fracture of the right femoral neck. 2. Postoperative radiographs demonstrate near anatomic alignment of  fracture with expected overlying change and no perihardware fracture. Electronically Signed   By: Delanna Ahmadi M.D.   On: 11/02/2021 16:25   DG C-Arm 1-60 Min-No Report  Result Date: 11/02/2021 CLINICAL DATA:  Intramedullary nail fixation, femoral neck fracture EXAM: DG HIP (WITH OR WITHOUT PELVIS) 2-3V RIGHT; DG C-ARM 1-60 MIN-NO REPORT COMPARISON:  CT right hip, 11/02/2021, 10:07 a.m. FLUOROSCOPY TIME:  1:21 FINDINGS: Intraoperative fluoroscopic images of the right hip demonstrate intramedullary nail fixation of transcervical fracture of the right femoral neck. Postoperative radiographs demonstrate near anatomic alignment of fracture with expected overlying change and no perihardware fracture. IMPRESSION: 1. Intraoperative fluoroscopic images of the right hip demonstrate intramedullary nail fixation of transcervical fracture of the right femoral neck. 2. Postoperative radiographs demonstrate near anatomic alignment of fracture with expected overlying change and no perihardware fracture. Electronically Signed   By: Delanna Ahmadi M.D.   On: 11/02/2021 16:25   DG HIP UNILAT WITH PELVIS 2-3 VIEWS RIGHT  Result Date: 11/02/2021 CLINICAL DATA:  Intramedullary nail fixation, femoral neck fracture EXAM: DG HIP (WITH OR WITHOUT PELVIS) 2-3V RIGHT; DG C-ARM 1-60 MIN-NO REPORT COMPARISON:  CT right hip, 11/02/2021, 10:07 a.m. FLUOROSCOPY TIME:  1:21 FINDINGS: Intraoperative fluoroscopic images of the right hip demonstrate intramedullary nail fixation of transcervical fracture of the right femoral neck. Postoperative radiographs demonstrate near anatomic alignment of fracture with expected overlying change and no perihardware fracture. IMPRESSION: 1. Intraoperative fluoroscopic images of the right hip demonstrate intramedullary nail fixation of transcervical fracture of the right femoral neck. 2. Postoperative radiographs demonstrate near anatomic alignment of fracture with expected overlying change and no  perihardware fracture. Electronically Signed   By: Delanna Ahmadi M.D.   On: 11/02/2021 16:25   DG HIP UNILAT WITH PELVIS 2-3 VIEWS RIGHT  Result Date: 11/02/2021 CLINICAL DATA:  Intramedullary nail fixation, femoral neck fracture EXAM: DG HIP (WITH OR WITHOUT PELVIS) 2-3V RIGHT; DG C-ARM 1-60 MIN-NO REPORT COMPARISON:  CT right hip, 11/02/2021, 10:07 a.m. FLUOROSCOPY TIME:  1:21 FINDINGS: Intraoperative fluoroscopic images of the right hip demonstrate intramedullary nail fixation of transcervical fracture of the right femoral neck. Postoperative radiographs demonstrate near anatomic alignment of fracture with expected overlying change and no perihardware fracture. IMPRESSION: 1. Intraoperative fluoroscopic images of the right hip demonstrate intramedullary nail fixation of transcervical fracture of the right femoral neck. 2. Postoperative radiographs demonstrate near anatomic alignment of fracture with expected overlying change and no perihardware fracture. Electronically Signed   By: Delanna Ahmadi M.D.   On: 11/02/2021 16:25   DG Hip Unilat  With Pelvis 2-3 Views Right  Result Date: 11/02/2021 CLINICAL DATA:  60 year old male status post fall landing on right hip. Unable to weightbear. EXAM: DG HIP (WITH OR WITHOUT PELVIS) 2-3V  RIGHT COMPARISON:  None. FINDINGS: Femoral heads are normally located. Mildly impacted fracture of the distal right femoral neck. But possible nondisplaced comminution of the intertrochanteric segment also. Proximal right femoral shaft intact. No superimposed pelvis fracture identified. Grossly intact proximal left femur. Extensive vascular calcifications in and around the pelvis. SI joints appear symmetric. Paucity of bowel gas in the lower abdomen. IMPRESSION: Positive for acute mildly impacted fracture of the proximal right femur. Fractured distal femoral neck, but suspect intertrochanteric involvement also. Electronically Signed   By: Genevie Ann M.D.   On: 11/02/2021 05:24       Labs: BNP (last 3 results) No results for input(s): BNP in the last 8760 hours. Basic Metabolic Panel: Recent Labs  Lab 11/02/21 0416 11/03/21 0418 11/04/21 0653 11/05/21 0308 11/06/21 0207  NA 132* 131* 136 133* 134*  K 3.8 4.8 4.2 3.2* 3.5  CL 96* 95* 99 96* 98  CO2 30 27 28 28 26   GLUCOSE 416* 329* 237* 207* 161*  BUN 22 32* 50* 40* 56*  CREATININE 3.76* 4.35* 5.52* 4.85* 5.40*  CALCIUM 8.3* 8.3* 8.9 8.5* 8.9  PHOS  --   --   --  4.1  --    Liver Function Tests: No results for input(s): AST, ALT, ALKPHOS, BILITOT, PROT, ALBUMIN in the last 168 hours. No results for input(s): LIPASE, AMYLASE in the last 168 hours. No results for input(s): AMMONIA in the last 168 hours. CBC: Recent Labs  Lab 11/02/21 0416 11/03/21 0418 11/04/21 0653 11/05/21 0308 11/06/21 0207  WBC 9.8 11.7* 10.0 8.1 8.6  HGB 11.9* 11.4* 10.9* 10.3* 10.7*  HCT 37.2* 35.4* 33.9* 31.8* 33.2*  MCV 83.0 83.5 83.3 81.7 82.0  PLT 200 201 210 187 206   Cardiac Enzymes: No results for input(s): CKTOTAL, CKMB, CKMBINDEX, TROPONINI in the last 168 hours. BNP: Invalid input(s): POCBNP CBG: Recent Labs  Lab 11/05/21 1144 11/05/21 1743 11/05/21 2235 11/06/21 0802 11/06/21 1154  GLUCAP 239* 269* 214* 200* 310*   D-Dimer No results for input(s): DDIMER in the last 72 hours. Hgb A1c No results for input(s): HGBA1C in the last 72 hours. Lipid Profile No results for input(s): CHOL, HDL, LDLCALC, TRIG, CHOLHDL, LDLDIRECT in the last 72 hours. Thyroid function studies No results for input(s): TSH, T4TOTAL, T3FREE, THYROIDAB in the last 72 hours.  Invalid input(s): FREET3 Anemia work up No results for input(s): VITAMINB12, FOLATE, FERRITIN, TIBC, IRON, RETICCTPCT in the last 72 hours. Urinalysis    Component Value Date/Time   COLORURINE STRAW (A) 02/15/2021 1600   APPEARANCEUR CLEAR (A) 02/15/2021 1600   LABSPEC 1.008 02/15/2021 1600   PHURINE 5.0 02/15/2021 1600   GLUCOSEU >=500 (A)  02/15/2021 1600   HGBUR NEGATIVE 02/15/2021 1600   BILIRUBINUR NEGATIVE 02/15/2021 1600   KETONESUR NEGATIVE 02/15/2021 1600   PROTEINUR 100 (A) 02/15/2021 1600   NITRITE NEGATIVE 02/15/2021 1600   LEUKOCYTESUR NEGATIVE 02/15/2021 1600   Sepsis Labs Invalid input(s): PROCALCITONIN,  WBC,  LACTICIDVEN Microbiology Recent Results (from the past 240 hour(s))  Resp Panel by RT-PCR (Flu A&B, Covid) Nasopharyngeal Swab     Status: None   Collection Time: 11/02/21  5:22 AM   Specimen: Nasopharyngeal Swab; Nasopharyngeal(NP) swabs in vial transport medium  Result Value Ref Range Status   SARS Coronavirus 2 by RT PCR NEGATIVE NEGATIVE Final    Comment: (NOTE) SARS-CoV-2 target nucleic acids are NOT DETECTED.  The SARS-CoV-2 RNA is generally detectable in upper respiratory specimens during the acute phase of infection. The  lowest concentration of SARS-CoV-2 viral copies this assay can detect is 138 copies/mL. A negative result does not preclude SARS-Cov-2 infection and should not be used as the sole basis for treatment or other patient management decisions. A negative result may occur with  improper specimen collection/handling, submission of specimen other than nasopharyngeal swab, presence of viral mutation(s) within the areas targeted by this assay, and inadequate number of viral copies(<138 copies/mL). A negative result must be combined with clinical observations, patient history, and epidemiological information. The expected result is Negative.  Fact Sheet for Patients:  EntrepreneurPulse.com.au  Fact Sheet for Healthcare Providers:  IncredibleEmployment.be  This test is no t yet approved or cleared by the [place] FDA and  has been authorized for detection and/or diagnosis of SARS-CoV-2 by FDA under an Emergency Use Authorization (EUA). This EUA will remain  in effect (meaning this test can be used) for the duration of the COVID-19  declaration under Section 564(b)(1) of the Act, 21 U.S.C.section 360bbb-3(b)(1), unless the authorization is terminated  or revoked sooner.       Influenza A by PCR NEGATIVE NEGATIVE Final   Influenza B by PCR NEGATIVE NEGATIVE Final    Comment: (NOTE) The Xpert Xpress SARS-CoV-2/FLU/RSV plus assay is intended as an aid in the diagnosis of influenza from Nasopharyngeal swab specimens and should not be used as a sole basis for treatment. Nasal washings and aspirates are unacceptable for Xpert Xpress SARS-CoV-2/FLU/RSV testing.  Fact Sheet for Patients: EntrepreneurPulse.com.au  Fact Sheet for Healthcare Providers: IncredibleEmployment.be  This test is not yet approved or cleared by the [place] FDA and has been authorized for detection and/or diagnosis of SARS-CoV-2 by FDA under an Emergency Use Authorization (EUA). This EUA will remain in effect (meaning this test can be used) for the duration of the COVID-19 declaration under Section 564(b)(1) of the Act, 21 U.S.C. section 360bbb-3(b)(1), unless the authorization is terminated or revoked.  Performed at [facility], [address] [place], [place] [address]   Resp Panel by RT-PCR (Flu A&B, Covid) Nasopharyngeal Swab     Status: None   Collection Time: 11/05/21  4:48 PM   Specimen: Nasopharyngeal Swab; Nasopharyngeal(NP) swabs in vial transport medium  Result Value Ref Range Status   SARS Coronavirus 2 by RT PCR NEGATIVE NEGATIVE Final    Comment: (NOTE) SARS-CoV-2 target nucleic acids are NOT DETECTED.  The SARS-CoV-2 RNA is generally detectable in upper respiratory specimens during the acute phase of infection. The lowest concentration of SARS-CoV-2 viral copies this assay can detect is 138 copies/mL. A negative result does not preclude SARS-Cov-2 infection and should not be used as the sole basis for treatment or other patient management decisions. A negative result  may occur with  improper specimen collection/handling, submission of specimen other than nasopharyngeal swab, presence of viral mutation(s) within the areas targeted by this assay, and inadequate number of viral copies(<138 copies/mL). A negative result must be combined with clinical observations, patient history, and epidemiological information. The expected result is Negative.  Fact Sheet for Patients:  EntrepreneurPulse.com.au  Fact Sheet for Healthcare Providers:  IncredibleEmployment.be  This test is no t yet approved or cleared by the [place] FDA and  has been authorized for detection and/or diagnosis of SARS-CoV-2 by FDA under an Emergency Use Authorization (EUA). This EUA will remain  in effect (meaning this test can be used) for the duration of the COVID-19 declaration under Section 564(b)(1) of the Act, 21 U.S.C.section 360bbb-3(b)(1), unless the authorization is terminated  or revoked sooner.       Influenza A by PCR NEGATIVE NEGATIVE Final   Influenza B by PCR NEGATIVE NEGATIVE Final    Comment: (NOTE) The Xpert Xpress SARS-CoV-2/FLU/RSV plus assay is intended as an aid in the diagnosis of influenza from Nasopharyngeal swab specimens and should not be used as a sole basis for treatment. Nasal washings and aspirates are unacceptable for Xpert Xpress SARS-CoV-2/FLU/RSV testing.  Fact Sheet for Patients: EntrepreneurPulse.com.au  Fact Sheet for Healthcare Providers: IncredibleEmployment.be  This test is not yet approved or cleared by the [place] FDA and has been authorized for detection and/or diagnosis of SARS-CoV-2 by FDA under an Emergency Use Authorization (EUA). This EUA will remain in effect (meaning this test can be used) for the duration of the COVID-19 declaration under Section 564(b)(1) of the Act, 21 U.S.C. section 360bbb-3(b)(1), unless the authorization is terminated  or revoked.  Performed at [facility], [address] [place], [place] [address]      Total time spend on discharging this patient, including the last patient exam, discussing the hospital stay, instructions for ongoing care as it relates to all pertinent caregivers, as well as preparing the medical discharge records, prescriptions, and/or referrals as applicable, is 35 minutes.    Enzo Bi, MD  [facility] 11/06/2021, 12:56 PM

## 2021-11-06 NOTE — Progress Notes (Signed)
Physical Therapy Treatment Patient Details Name: Bobby Sampson MRN: [id number] DOB: [birth year] Today's Date: 11/06/2021   History of Present Illness Pt is a 60 y/o M admitted on 11/02/21 with c/c of fall & R hip pain. Pt was found to have R basicervical/intertrochanteric hip fracture & underwent IM fixation on 11/02/21 by Dr. Mack Guise. PMH: ESRD on HD MWF, HTN, HLD, DM, stroke with R sided weakness, GERD, depression, peripheral neuropathy, OSA not using CPAP consistently, dCHF    PT Comments    Pt was sitting in recliner upon arriving. He is A and O x 3. Resistive to standing activity." I think I over did it yesterday." With encouragement, pt agrees to performing exercises. Chief Strategy Officer issued pt a HEP handout and pt performed. He is planning to have HD this afternoon prior to [place] to SNF. See exercises listed below. He will greatly benefit from SNF at DC to address deficits while maximizing independence with ADLs.   Recommendations for follow up therapy are one component of a multi-disciplinary discharge planning process, led by the attending physician.  Recommendations may be updated based on patient status, additional functional criteria and insurance authorization.  Follow Up Recommendations  Skilled nursing-short term rehab (<3 hours/day)     Assistance Recommended at Discharge Frequent or constant Supervision/Assistance  Equipment Recommendations  Other (comment) (defer to next level of care)       Precautions / Restrictions Precautions Precautions: Fall Precaution Comments: RUE dialysis fistula Restrictions Weight Bearing Restrictions: Yes RLE Weight Bearing: Weight bearing as tolerated     Mobility  Bed Mobility  General bed mobility comments: in recliner pre/post session    Transfers    General transfer comment: pt requested not to get up and ambulate however was willing to perform HEP.    Ambulation/Gait  General Gait Details: unwilling currently due to pain    Balance  Overall balance assessment: Needs assistance Sitting-balance support: Feet supported;No upper extremity supported Sitting balance-Leahy Scale: Good     Standing balance support: No upper extremity supported;During functional activity Standing balance-Leahy Scale: Fair     Cognition Arousal/Alertness: Awake/alert Behavior During Therapy: WFL for tasks assessed/performed Overall Cognitive Status: Within Functional Limits for tasks assessed      General Comments: Pt is A and O x 3. Author questions if he understands full scope of situation        Exercises General Exercises - Lower Extremity Ankle Circles/Pumps: AROM;Both;10 reps Quad Sets: AROM;Strengthening;Right;10 reps;Seated Long Arc Quad: AROM;Strengthening;Right;10 reps;Seated Heel Slides: AROM;Strengthening;Right;10 reps Hip ABduction/ADduction: AAROM;10 reps Straight Leg Raises: AAROM;10 reps        Pertinent Vitals/Pain Pain Assessment: 0-10 Pain Score: 6  Pain Location: RLE Pain Descriptors / Indicators: Tightness;Grimacing Pain Intervention(s): Limited activity within patient's tolerance;Monitored during session;Premedicated before session;Repositioned     PT Goals (current goals can now be found in the care plan section) Acute Rehab PT Goals Patient Stated Goal: get better, go home Progress towards PT goals: Progressing toward goals    Frequency    BID      PT Plan Current plan remains appropriate    Co-evaluation     PT goals addressed during session: Strengthening/ROM        AM-PAC PT "6 Clicks" Mobility   Outcome Measure  Help needed turning from your back to your side while in a flat bed without using bedrails?: A Little Help needed moving from lying on your back to sitting on the side of a flat bed without using bedrails?: A Little Help  needed moving to and from a bed to a chair (including a wheelchair)?: A Little Help needed standing up from a chair using your arms (e.g., wheelchair or  bedside chair)?: A Lot Help needed to walk in hospital room?: A Lot Help needed climbing 3-5 steps with a railing? : A Lot 6 Click Score: 15    End of Session Equipment Utilized During Treatment: Gait belt Activity Tolerance: Patient limited by pain;Patient limited by fatigue Patient left: in chair;with chair alarm set;with call bell/phone within reach Nurse Communication: Mobility status PT Visit Diagnosis: Unsteadiness on feet (R26.81);Muscle weakness (generalized) (M62.81);Difficulty in walking, not elsewhere classified (R26.2);Pain Pain - Right/Left: Right Pain - part of body: Hip     Time: 1201-1219 PT Time Calculation (min) (ACUTE ONLY): 18 min  Charges:  $Therapeutic Exercise: 8-22 mins                     Julaine Fusi PTA 11/06/21, 1:15 PM

## 2021-11-06 NOTE — TOC Progression Note (Signed)
Transition of Care [facility]) - Progression Note    Patient Details  Name: Bobby Sampson MRN: [id number] Date of Birth: 15-Mar-1961  Transition of Care [facility]) CM/SW [place], RN Phone Number: 11/06/2021, 3:04 PM  Clinical Narrative:   The patient wants to go to [facility], [facility] offered a bed, I spoke with the patient's wife and provided the bed number 708, he will transport after dialysis going to room 708 at [facility], nursing to call report and ems    Expected Discharge Plan: [place] Barriers to Discharge: Continued Medical Work up  Expected Discharge Plan and Services Expected Discharge Plan: [place]   Discharge Planning Services: CM Consult Post Acute Care Choice: Durable Medical Equipment, Home Health Living arrangements for the past 2 months: Single Family Home Expected Discharge Date: 11/06/21               DME Arranged: 3-N-1 DME Agency: [facility] Date DME Agency Contacted: 11/03/21 Time DME Agency Contacted: [phone] Representative spoke with at DME Agency: jasmine HH Arranged: PT, OT, Nurse's Aide           Social Determinants of Health (SDOH) Interventions    Readmission Risk Interventions No flowsheet data found.

## 2021-11-06 NOTE — TOC Progression Note (Signed)
Transition of Care [facility]) - Progression Note    Patient Details  Name: Bobby Sampson MRN: [id number] Date of Birth: 02-26-1961  Transition of Care [facility]) CM/SW [place], RN Phone Number: 11/06/2021, 2:19 PM  Clinical Narrative:    Spoke to the patient about going to [facility] to room 302 B, he said that he is not going to go to [facility], I asked where he is going and he stated that he is going home, I called his wife [name], she stated that he has used [facility] in the past and they want to set up [facility] for [facility], I contacted Malachy Mood with [facility] to set up, He need a 3 in 1, I notified [employer] to deliver to the room prior to DC, he has a wheelchair and a RW at home, his wife [name] will pick himn up   Expected Discharge Plan: [place] Barriers to Discharge: Continued Medical Work up  Expected Discharge Plan and Services Expected Discharge Plan: [place]   Discharge Planning Services: CM Consult Post Acute Care Choice: Durable Medical Equipment, [place] arrangements for the past 2 months: Single Family Home Expected Discharge Date: 11/06/21               DME Arranged: 3-N-1 DME Agency: [facility] Date DME Agency Contacted: 11/03/21 Time DME Agency Contacted: [phone] Representative spoke with at DME Agency: jasmine HH Arranged: PT, OT, Nurse's Aide           Social Determinants of Health (SDOH) Interventions    Readmission Risk Interventions No flowsheet data found.

## 2021-11-06 NOTE — TOC Progression Note (Addendum)
Transition of Care [facility]) - Progression Note    Patient Details  Name: Bobby Sampson MRN: [id number] Date of Birth: 1961/07/15  Transition of Care [facility]) CM/SW [place], RN Phone Number: 11/06/2021, 3:26 PM  Clinical Narrative:   called [facility] to confirm chair time and location [address] MWF at 10 AM    Expected Discharge Plan: [place] Barriers to Discharge: Continued Medical Work up  Expected Discharge Plan and Services Expected Discharge Plan: [place]   Discharge Planning Services: CM Consult Post Acute Care Choice: Durable Medical Equipment, Home Health Living arrangements for the past 2 months: Single Family Home Expected Discharge Date: 11/06/21               DME Arranged: 3-N-1 DME Agency: [employer] Date DME Agency Contacted: 11/03/21 Time DME Agency Contacted: [phone] Representative spoke with at DME Agency: jasmine HH Arranged: PT, OT, Nurse's Aide           Social Determinants of Health (SDOH) Interventions    Readmission Risk Interventions No flowsheet data found.

## 2022-01-26 ENCOUNTER — Emergency Department: Payer: Medicare Other

## 2022-01-26 ENCOUNTER — Encounter: Payer: Self-pay | Admitting: Emergency Medicine

## 2022-01-26 ENCOUNTER — Other Ambulatory Visit: Payer: Self-pay

## 2022-01-26 ENCOUNTER — Observation Stay
Admission: EM | Admit: 2022-01-26 | Discharge: 2022-01-27 | Disposition: A | Discharge status: Home Health | Payer: Medicare Other | Attending: Internal Medicine | Admitting: Internal Medicine

## 2022-01-26 DIAGNOSIS — R2681 Unsteadiness on feet: Secondary | ICD-10-CM | POA: Insufficient documentation

## 2022-01-26 DIAGNOSIS — E1129 Type 2 diabetes mellitus with other diabetic kidney complication: Secondary | ICD-10-CM | POA: Diagnosis present

## 2022-01-26 DIAGNOSIS — Z992 Dependence on renal dialysis: Secondary | ICD-10-CM | POA: Insufficient documentation

## 2022-01-26 DIAGNOSIS — Z8673 Personal history of transient ischemic attack (TIA), and cerebral infarction without residual deficits: Secondary | ICD-10-CM | POA: Insufficient documentation

## 2022-01-26 DIAGNOSIS — N186 End stage renal disease: Secondary | ICD-10-CM

## 2022-01-26 DIAGNOSIS — Z7984 Long term (current) use of oral hypoglycemic drugs: Secondary | ICD-10-CM | POA: Diagnosis not present

## 2022-01-26 DIAGNOSIS — Z7901 Long term (current) use of anticoagulants: Secondary | ICD-10-CM | POA: Diagnosis not present

## 2022-01-26 DIAGNOSIS — W19XXXA Unspecified fall, initial encounter: Secondary | ICD-10-CM | POA: Insufficient documentation

## 2022-01-26 DIAGNOSIS — D631 Anemia in chronic kidney disease: Secondary | ICD-10-CM | POA: Diagnosis present

## 2022-01-26 DIAGNOSIS — S72001A Fracture of unspecified part of neck of right femur, initial encounter for closed fracture: Secondary | ICD-10-CM | POA: Diagnosis not present

## 2022-01-26 DIAGNOSIS — S32509A Unspecified fracture of unspecified pubis, initial encounter for closed fracture: Secondary | ICD-10-CM

## 2022-01-26 DIAGNOSIS — E119 Type 2 diabetes mellitus without complications: Secondary | ICD-10-CM | POA: Diagnosis not present

## 2022-01-26 DIAGNOSIS — Z9181 History of falling: Secondary | ICD-10-CM | POA: Diagnosis not present

## 2022-01-26 DIAGNOSIS — Z7982 Long term (current) use of aspirin: Secondary | ICD-10-CM | POA: Diagnosis not present

## 2022-01-26 DIAGNOSIS — Z79899 Other long term (current) drug therapy: Secondary | ICD-10-CM | POA: Diagnosis not present

## 2022-01-26 DIAGNOSIS — E1122 Type 2 diabetes mellitus with diabetic chronic kidney disease: Secondary | ICD-10-CM | POA: Diagnosis not present

## 2022-01-26 DIAGNOSIS — Y9301 Activity, walking, marching and hiking: Secondary | ICD-10-CM | POA: Diagnosis not present

## 2022-01-26 DIAGNOSIS — Z20822 Contact with and (suspected) exposure to covid-19: Secondary | ICD-10-CM | POA: Diagnosis not present

## 2022-01-26 DIAGNOSIS — S329XXA Fracture of unspecified parts of lumbosacral spine and pelvis, initial encounter for closed fracture: Secondary | ICD-10-CM | POA: Diagnosis present

## 2022-01-26 DIAGNOSIS — Y92019 Unspecified place in single-family (private) house as the place of occurrence of the external cause: Secondary | ICD-10-CM | POA: Diagnosis not present

## 2022-01-26 DIAGNOSIS — M25559 Pain in unspecified hip: Secondary | ICD-10-CM | POA: Diagnosis present

## 2022-01-26 DIAGNOSIS — I10 Essential (primary) hypertension: Secondary | ICD-10-CM | POA: Diagnosis not present

## 2022-01-26 LAB — CBC WITH DIFFERENTIAL/PLATELET
Abs Immature Granulocytes: 0.03 10*3/uL (ref 0.00–0.07)
Basophils Absolute: 0 10*3/uL (ref 0.0–0.1)
Basophils Relative: 0 %
Eosinophils Absolute: 0.3 10*3/uL (ref 0.0–0.5)
Eosinophils Relative: 3 %
HCT: 38 % — ABNORMAL LOW (ref 39.0–52.0)
Hemoglobin: 12 g/dL — ABNORMAL LOW (ref 13.0–17.0)
Immature Granulocytes: 0 %
Lymphocytes Relative: 8 %
Lymphs Abs: 0.8 10*3/uL (ref 0.7–4.0)
MCH: 26.3 pg (ref 26.0–34.0)
MCHC: 31.6 g/dL (ref 30.0–36.0)
MCV: 83.3 fL (ref 80.0–100.0)
Monocytes Absolute: 0.5 10*3/uL (ref 0.1–1.0)
Monocytes Relative: 5 %
Neutro Abs: 8.2 10*3/uL — ABNORMAL HIGH (ref 1.7–7.7)
Neutrophils Relative %: 84 %
Platelets: 186 10*3/uL (ref 150–400)
RBC: 4.56 MIL/uL (ref 4.22–5.81)
RDW: 15 % (ref 11.5–15.5)
WBC: 9.8 10*3/uL (ref 4.0–10.5)
nRBC: 0 % (ref 0.0–0.2)

## 2022-01-26 LAB — BASIC METABOLIC PANEL
Anion gap: 11 (ref 5–15)
BUN: 34 mg/dL — ABNORMAL HIGH (ref 8–23)
CO2: 30 mmol/L (ref 22–32)
Calcium: 9 mg/dL (ref 8.9–10.3)
Chloride: 97 mmol/L — ABNORMAL LOW (ref 98–111)
Creatinine, Ser: 4.34 mg/dL — ABNORMAL HIGH (ref 0.61–1.24)
GFR, Estimated: 15 mL/min — ABNORMAL LOW (ref >60)
Glucose, Bld: 151 mg/dL — ABNORMAL HIGH (ref 70–99)
Potassium: 3.9 mmol/L (ref 3.5–5.1)
Sodium: 138 mmol/L (ref 135–145)

## 2022-01-26 LAB — RESP PANEL BY RT-PCR (FLU A&B, COVID) ARPGX2
Influenza A by PCR: NEGATIVE
Influenza B by PCR: NEGATIVE
SARS Coronavirus 2 by RT PCR: NEGATIVE

## 2022-01-26 LAB — GLUCOSE, CAPILLARY: Glucose-Capillary: 145 mg/dL — ABNORMAL HIGH (ref 70–99)

## 2022-01-26 IMAGING — CR DG HIP (WITH OR WITHOUT PELVIS) 2-3V*R*
1 series · 3 of 3 positions shown · non-contrast
Comparison: [DATE].

CLINICAL DATA: 60-year-old male with history of trauma from a fall.
Right hip and pelvic pain.

EXAM:
DG HIP (WITH OR WITHOUT PELVIS) 2-3V RIGHT

[Series 1: dg hip unilat w or w/o pelvis 2-3 views  · non-contrast · 0.14mm/px · 3 of 3 slices shown]
[im 1/3]
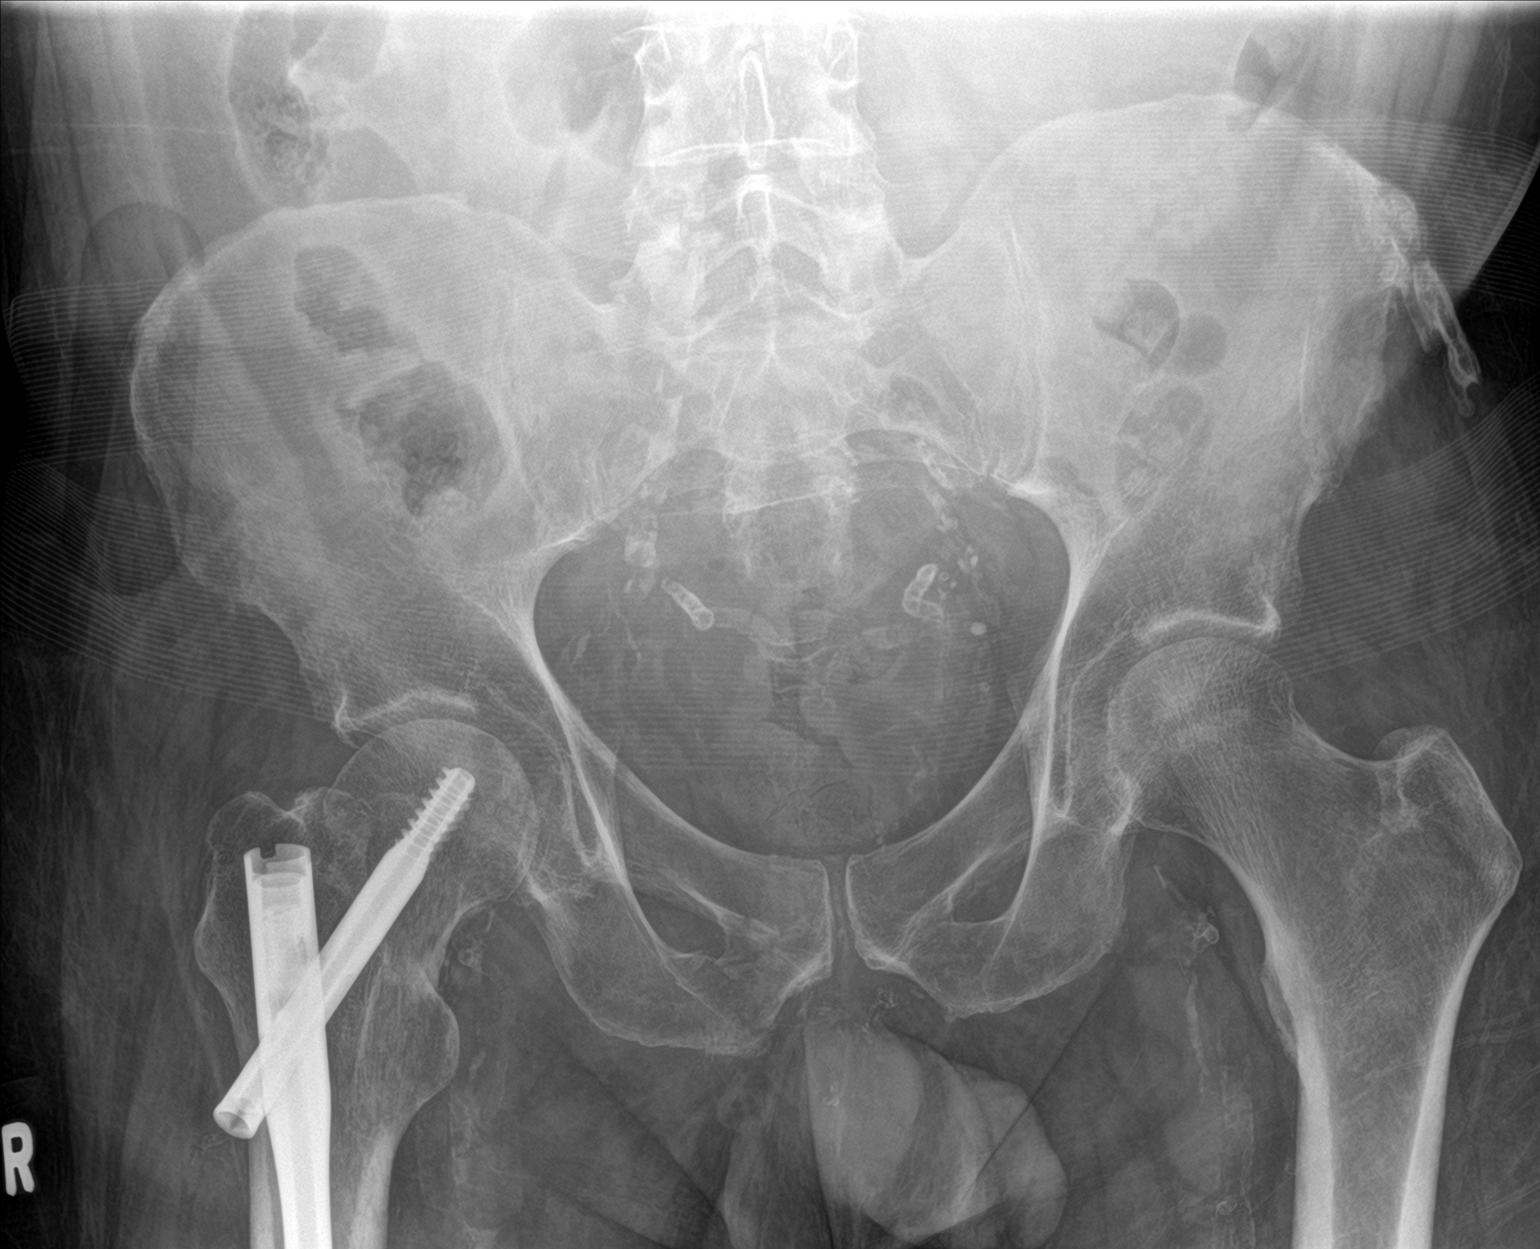
[im 2/3]
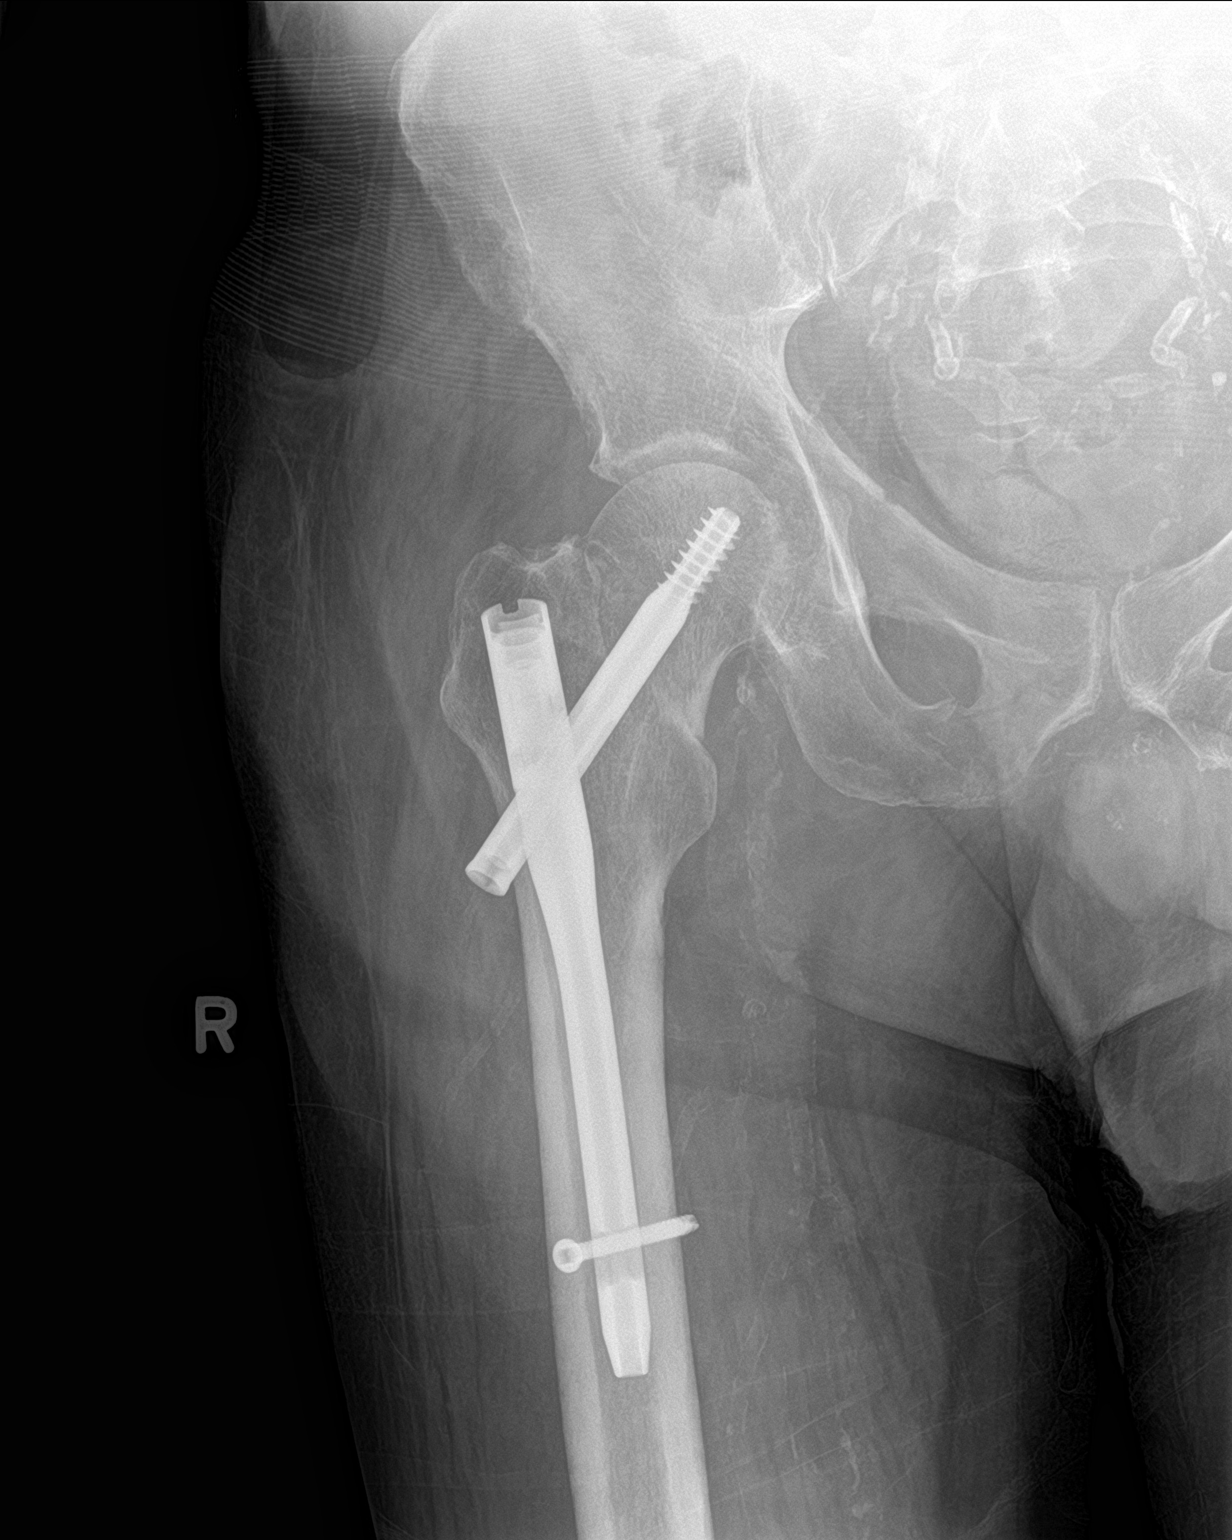
[im 3/3]
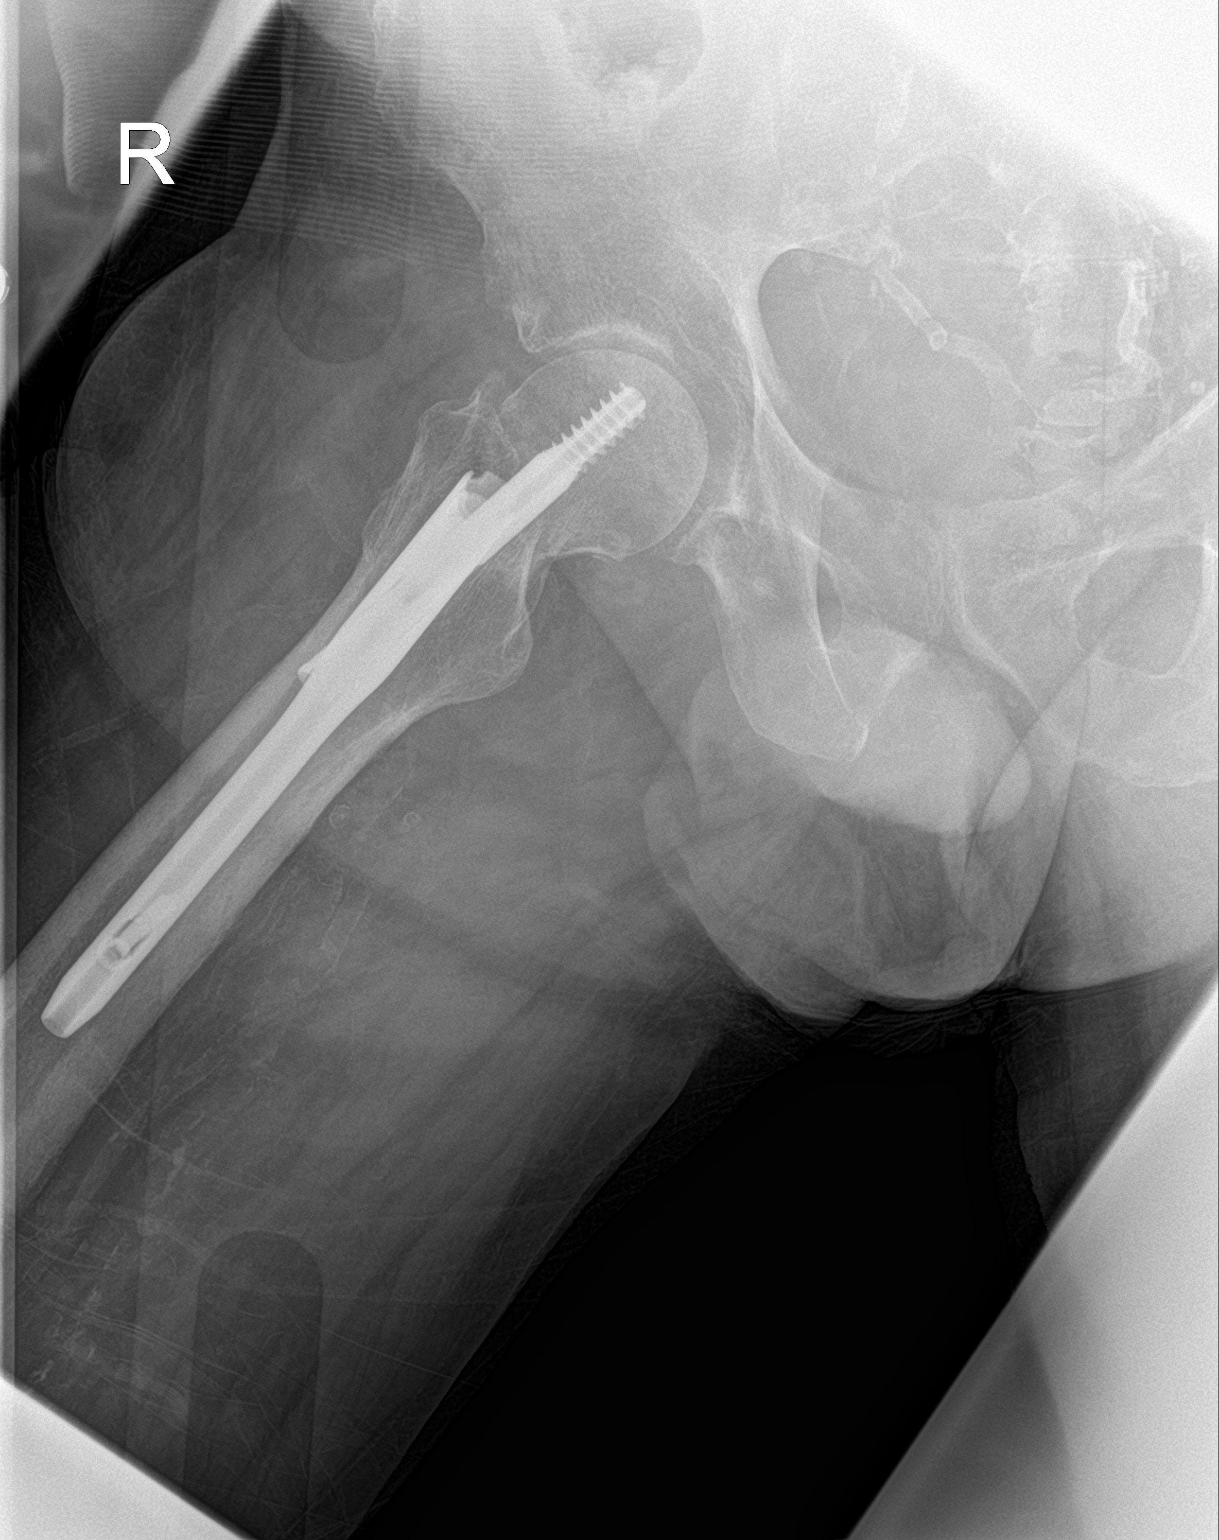

[3 of 3 positions shown; findings below may reference images not displayed]

FINDINGS: Postoperative changes of ORIF are noted in the right proximal femur.
Healing right femoral neck fracture again noted. Bony pelvis and
visualized portions of the left proximal femur appear intact. Right
femoral head is located. Joint space narrowing, subchondral
sclerosis and osteophyte formation in the hip joints bilaterally
indicative of osteoarthritis. Numerous vascular calcifications are
noted.
IMPRESSION: 1. No acute radiographic abnormality of the bony pelvis or the right
hip.
2. Status post ORIF in the right hip with healing transcervical
right femoral neck fracture.

## 2022-01-26 IMAGING — CT CT HIP*R* W/O CM
2 of 6 series · 15 of 46 positions shown, 17 images · non-contrast
Comparison: [DATE]

CLINICAL DATA: Fall. Severe right hip pain. Recent internal
fixation of right hip fracture proximally 3 months ago.

EXAM:
CT OF THE RIGHT HIP WITHOUT CONTRAST
TECHNIQUE: Multidetector CT imaging of the right hip was performed according to
the standard protocol. Multiplanar CT image reconstructions were
also generated.
RADIATION DOSE REDUCTION: This exam was performed according to the
departmental dose-optimization program which includes automated
exposure control, adjustment of the mA and/or kV according to
patient size and/or use of iterative reconstruction technique.

[Series 3: axial st · axial · 0.47mm/px · z∈[-1382,-1080]mm · 12 of 175 slices shown, 14 images]
[im 12/175  soft-tissue]
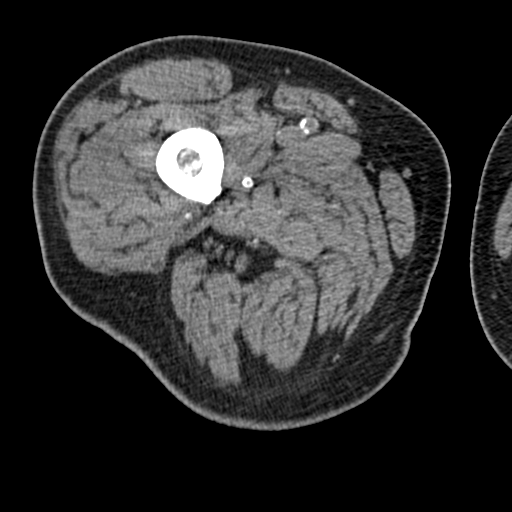
[im 12/175  bone]
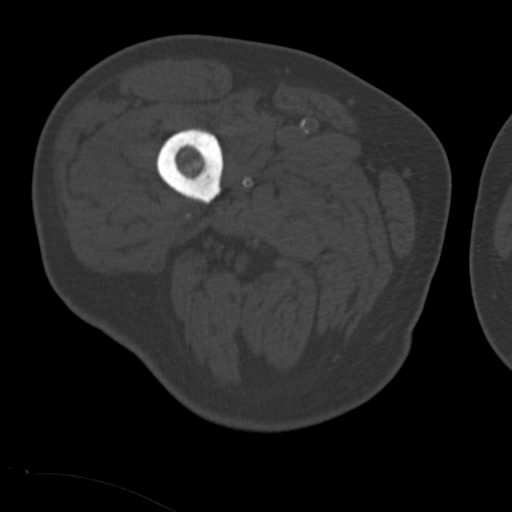
[im 24/175  soft-tissue]
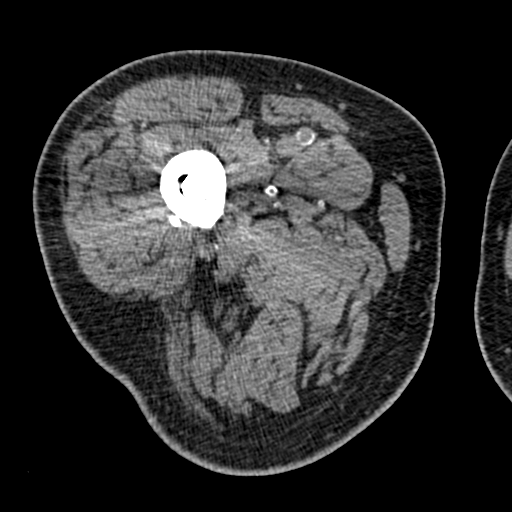
[im 35/175  soft-tissue]
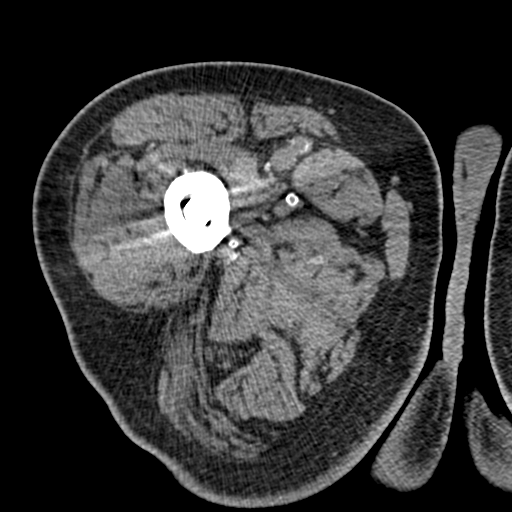
[im 59/175  soft-tissue]
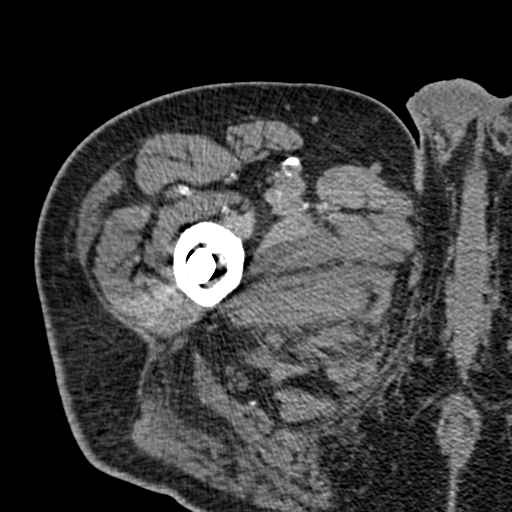
[im 70/175  soft-tissue]
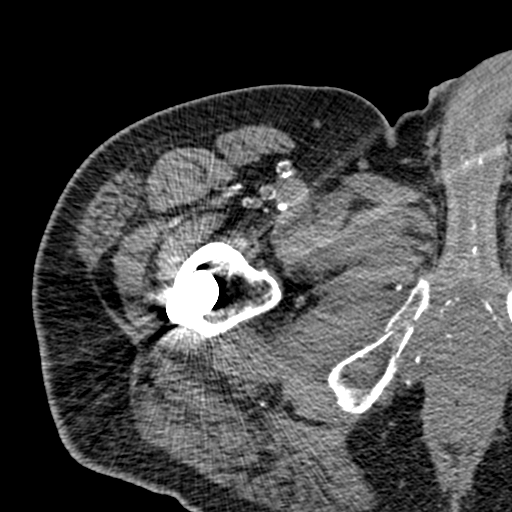
[im 82/175  soft-tissue]
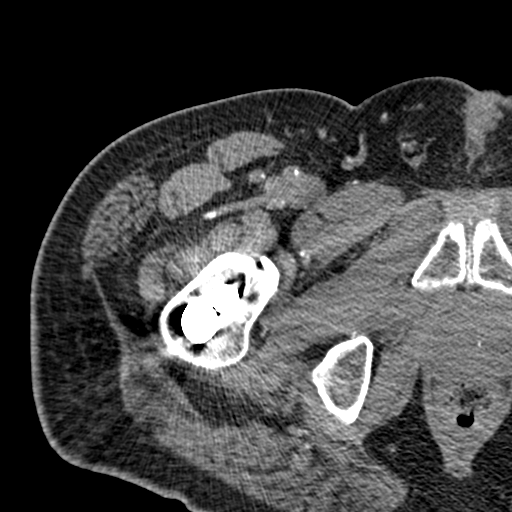
[im 93/175  soft-tissue]
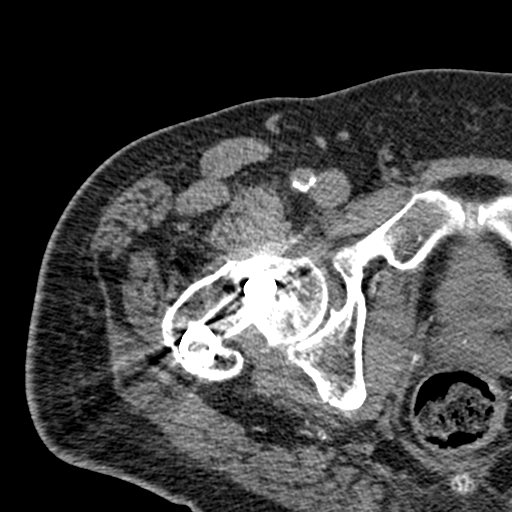
[im 105/175  soft-tissue]
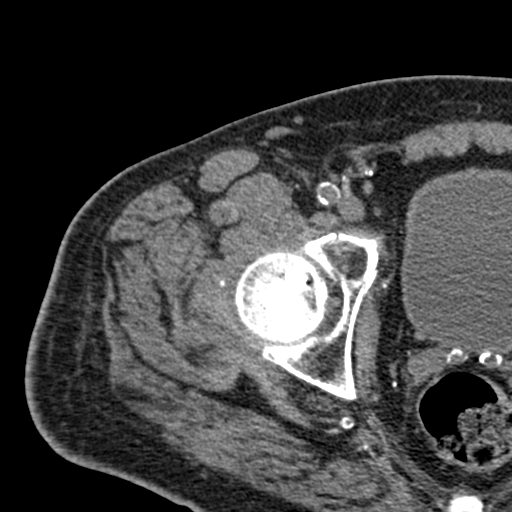
[im 117/175  soft-tissue]
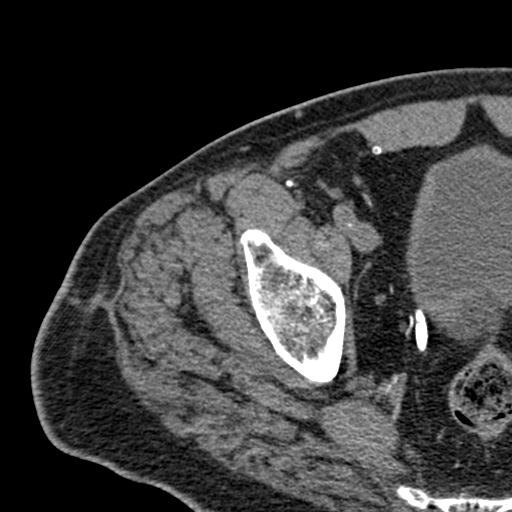
[im 117/175  bone]
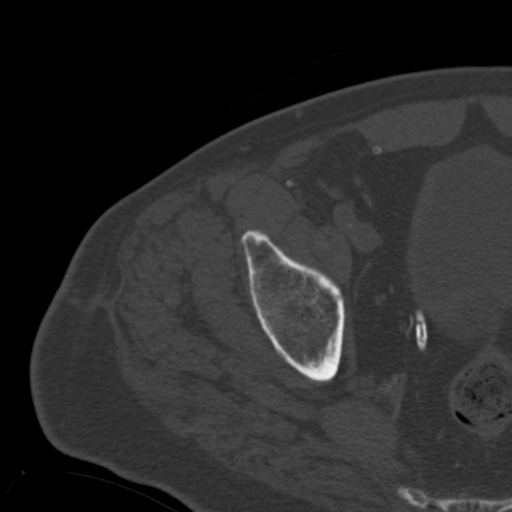
[im 140/175  soft-tissue]
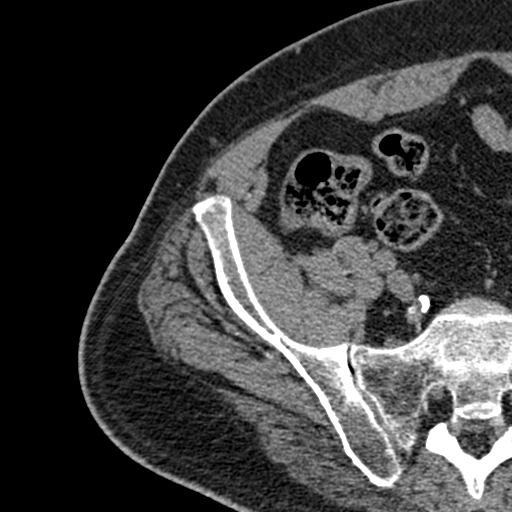
[im 151/175  soft-tissue]
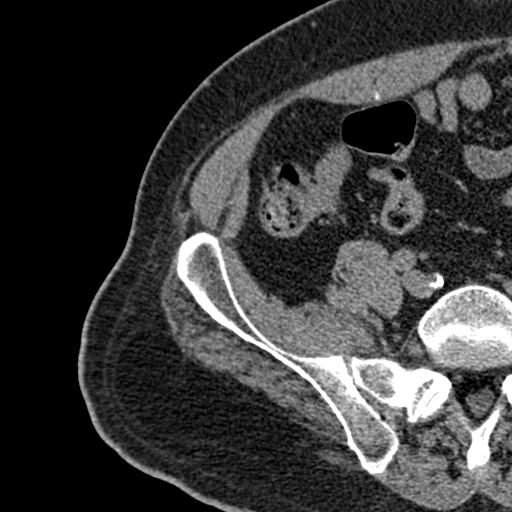
[im 163/175  soft-tissue]
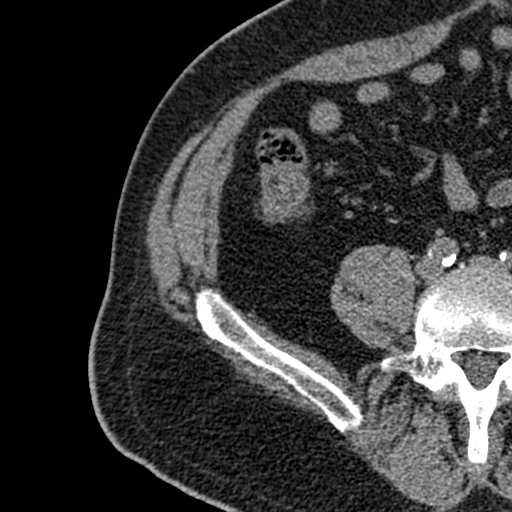

[Series 6: coronal st · coronal · 0.48mm/px · 3 of 144 slices shown]
[im 36/144  soft-tissue]
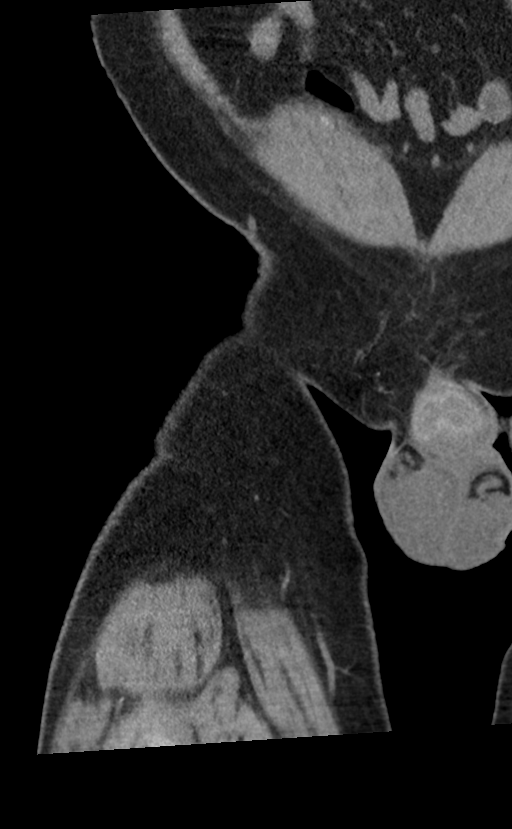
[im 72/144  soft-tissue]
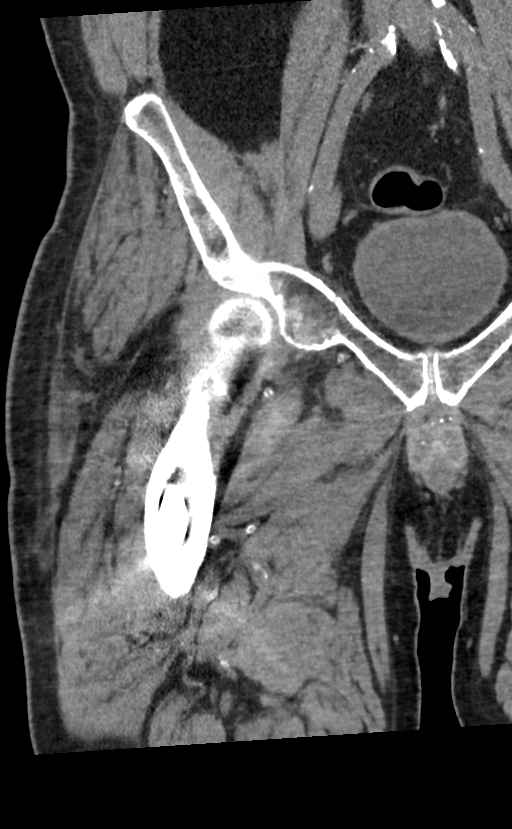
[im 108/144  soft-tissue]
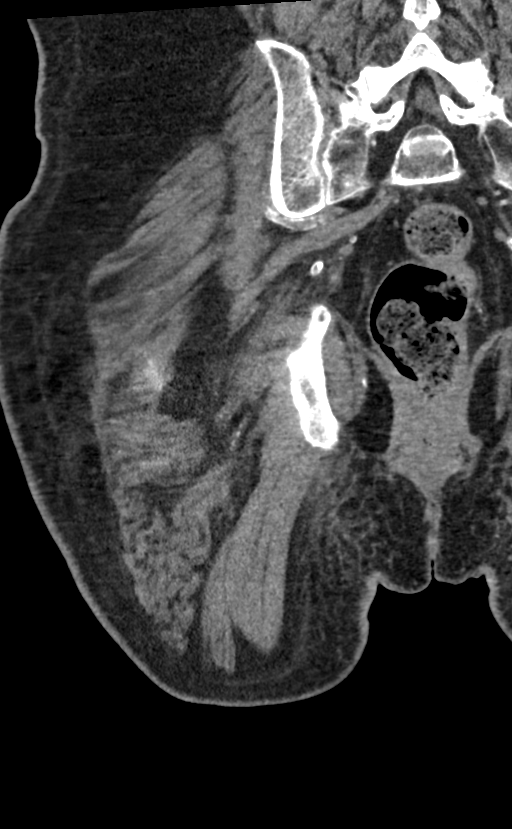

[15 of 46 positions shown; findings below may reference images not displayed]

FINDINGS: Bones/Joint/Cartilage

Lag screw and intramedullary rod are seen transfixing a basicervical
right femoral neck fracture. Fracture shows decreased displacement
and early healing since prior study. No new fracture is seen
involving the right hip. No evidence of dislocation.

Acute nondisplaced fractures are seen involving the right superior
and inferior pubic rami, which are new since previous study.

Ligaments

Suboptimally assessed by CT.

Muscles and Tendons

Unremarkable.

Soft tissues

Unremarkable.
IMPRESSION: Acute nondisplaced fractures of the right superior and inferior
pubic rami, new since previous study.

Healing right femoral neck fracture, status post internal fixation.

## 2022-01-26 MED ORDER — HEPARIN SODIUM (PORCINE) 5000 UNIT/ML IJ SOLN
5000.0000 [IU] | Freq: Three times a day (TID) | INTRAMUSCULAR | Status: DC
  Filled 2022-01-26: qty 1

## 2022-01-26 MED ORDER — ACETAMINOPHEN 500 MG PO TABS
1000.0000 mg | ORAL_TABLET | Freq: Once | ORAL | Status: AC
  Administered 2022-01-26: 1000 mg via ORAL
  Filled 2022-01-26: qty 2

## 2022-01-26 MED ORDER — INSULIN ASPART 100 UNIT/ML IJ SOLN: 0.0000 [IU] | Freq: Three times a day (TID) | INTRAMUSCULAR | Status: DC

## 2022-01-26 MED ORDER — INSULIN ASPART 100 UNIT/ML IJ SOLN
10.0000 [IU] | Freq: Three times a day (TID) | INTRAMUSCULAR | Status: DC
  Filled 2022-01-26 (×2): qty 1

## 2022-01-26 MED ORDER — CIPROFLOXACIN HCL 0.3 % OP SOLN
1.0000 [drp] | Freq: Four times a day (QID) | OPHTHALMIC | Status: DC
  Administered 2022-01-26 – 2022-01-27 (×2): 1 [drp] via OPHTHALMIC
  Filled 2022-01-26: qty 2.5

## 2022-01-26 MED ORDER — FLUOXETINE HCL 20 MG PO CAPS
20.0000 mg | ORAL_CAPSULE | Freq: Every day | ORAL | Status: DC
  Administered 2022-01-26 – 2022-01-27 (×2): 20 mg via ORAL
  Filled 2022-01-26 (×2): qty 1

## 2022-01-26 MED ORDER — MORPHINE SULFATE (PF) 4 MG/ML IV SOLN
4.0000 mg | Freq: Once | INTRAVENOUS | Status: DC
  Filled 2022-01-26: qty 1

## 2022-01-26 MED ORDER — MELATONIN 5 MG PO TABS
5.0000 mg | ORAL_TABLET | Freq: Every day | ORAL | Status: DC
  Administered 2022-01-26: 5 mg via ORAL
  Filled 2022-01-26: qty 1

## 2022-01-26 MED ORDER — INSULIN ASPART 100 UNIT/ML IJ SOLN
0.0000 [IU] | Freq: Three times a day (TID) | INTRAMUSCULAR | Status: DC
  Administered 2022-01-27: 2 [IU] via SUBCUTANEOUS
  Filled 2022-01-26: qty 1

## 2022-01-26 MED ORDER — OXYCODONE HCL 5 MG PO TABS
5.0000 mg | ORAL_TABLET | ORAL | Status: AC
  Administered 2022-01-26: 5 mg via ORAL
  Filled 2022-01-26: qty 1

## 2022-01-26 MED ORDER — INSULIN ASPART 100 UNIT/ML IJ SOLN: 0.0000 [IU] | Freq: Every day | INTRAMUSCULAR | Status: DC

## 2022-01-26 MED ORDER — OXYCODONE HCL 5 MG PO TABS
5.0000 mg | ORAL_TABLET | ORAL | Status: AC
Start: 2022-01-26 — End: 2022-01-26
  Administered 2022-01-26: 5 mg via ORAL
  Filled 2022-01-26: qty 1

## 2022-01-26 MED ORDER — CALCIUM ACETATE (PHOS BINDER) 667 MG PO CAPS
667.0000 mg | ORAL_CAPSULE | Freq: Three times a day (TID) | ORAL | Status: DC
  Administered 2022-01-27: 667 mg via ORAL
  Filled 2022-01-26 (×2): qty 1

## 2022-01-26 MED ORDER — AMLODIPINE BESYLATE 10 MG PO TABS
10.0000 mg | ORAL_TABLET | Freq: Every day | ORAL | Status: DC
  Administered 2022-01-26: 10 mg via ORAL
  Filled 2022-01-26: qty 2

## 2022-01-26 MED ORDER — METOPROLOL SUCCINATE ER 50 MG PO TB24: 50.0000 mg | ORAL_TABLET | Freq: Every day | ORAL | Status: DC

## 2022-01-26 MED ORDER — PANTOPRAZOLE SODIUM 40 MG PO TBEC
40.0000 mg | DELAYED_RELEASE_TABLET | Freq: Every day | ORAL | Status: DC
  Administered 2022-01-27: 40 mg via ORAL
  Filled 2022-01-26: qty 1

## 2022-01-26 MED ORDER — INSULIN GLARGINE-YFGN 100 UNIT/ML ~~LOC~~ SOLN
20.0000 [IU] | Freq: Two times a day (BID) | SUBCUTANEOUS | Status: DC
  Administered 2022-01-26 – 2022-01-27 (×2): 20 [IU] via SUBCUTANEOUS
  Filled 2022-01-26 (×4): qty 0.2

## 2022-01-26 MED ORDER — ONDANSETRON 8 MG PO TBDP
8.0000 mg | ORAL_TABLET | Freq: Three times a day (TID) | ORAL | Status: DC | PRN
  Filled 2022-01-26: qty 1

## 2022-01-26 MED ORDER — DORZOLAMIDE HCL-TIMOLOL MAL 2-0.5 % OP SOLN
1.0000 [drp] | Freq: Two times a day (BID) | OPHTHALMIC | Status: DC
  Administered 2022-01-26 – 2022-01-27 (×2): 1 [drp] via OPHTHALMIC
  Filled 2022-01-26: qty 10

## 2022-01-26 MED ORDER — LATANOPROST 0.005 % OP SOLN
1.0000 [drp] | Freq: Every day | OPHTHALMIC | Status: DC
  Administered 2022-01-26: 1 [drp] via OPHTHALMIC
  Filled 2022-01-26: qty 2.5

## 2022-01-26 MED ORDER — BUPROPION HCL ER (XL) 150 MG PO TB24
150.0000 mg | ORAL_TABLET | Freq: Every day | ORAL | Status: DC
  Administered 2022-01-26 – 2022-01-27 (×2): 150 mg via ORAL
  Filled 2022-01-26 (×2): qty 1

## 2022-01-26 MED ORDER — ASPIRIN EC 81 MG PO TBEC
81.0000 mg | DELAYED_RELEASE_TABLET | Freq: Every day | ORAL | Status: DC
  Administered 2022-01-27: 81 mg via ORAL
  Filled 2022-01-26: qty 1

## 2022-01-26 MED ORDER — ATORVASTATIN CALCIUM 20 MG PO TABS
80.0000 mg | ORAL_TABLET | Freq: Every day | ORAL | Status: DC
  Administered 2022-01-27: 80 mg via ORAL
  Filled 2022-01-26: qty 4

## 2022-01-26 MED ORDER — POLYETHYLENE GLYCOL 3350 17 G PO PACK
17.0000 g | PACK | Freq: Every day | ORAL | Status: DC
  Administered 2022-01-26 – 2022-01-27 (×2): 17 g via ORAL
  Filled 2022-01-26 (×2): qty 1

## 2022-01-26 MED ORDER — FENTANYL CITRATE PF 50 MCG/ML IJ SOSY
12.5000 ug | PREFILLED_SYRINGE | INTRAMUSCULAR | Status: DC | PRN
  Administered 2022-01-27: 12.5 ug via INTRAVENOUS
  Filled 2022-01-26: qty 1

## 2022-01-26 NOTE — ED Notes (Signed)
Ultrasound iv placed by Dr. Tamala Julian 20G left ac.

## 2022-01-26 NOTE — Consult Note (Signed)
Called by Dr. Tamala Julian in the ER regarding Bobby Sampson who is a 61 year old male who sustained a recent fall.  Patient has nondisplaced superior and inferior pubic rami fractures by CT scan in the ER.  She has had a previous intramedullary fixation of her right intertrochanteric hip fracture by me.  There is no evidence of refracture of his right hip.  Patient is reported to not be able to weight-bear on the right lower extremity due to his pubic rami fractures.  He is being admitted to the hospitalist service.  Patient may be weightbearing as tolerated.  I recommend physical and occupational therapy evaluations.  He may require a skilled nursing facility placement.    CT Hip Right Wo Contrast  Result Date: 01/26/2022 CLINICAL DATA:  Fall. Severe right hip pain. Recent internal fixation of right hip fracture proximally 3 months ago. EXAM: CT OF THE RIGHT HIP WITHOUT CONTRAST TECHNIQUE: Multidetector CT imaging of the right hip was performed according to the standard protocol. Multiplanar CT image reconstructions were also generated. RADIATION DOSE REDUCTION: This exam was performed according to the departmental dose-optimization program which includes automated exposure control, adjustment of the mA and/or kV according to patient size and/or use of iterative reconstruction technique. COMPARISON:  11/02/2021 FINDINGS: Bones/Joint/Cartilage Lag screw and intramedullary rod are seen transfixing a basicervical right femoral neck fracture. Fracture shows decreased displacement and early healing since prior study. No new fracture is seen involving the right hip. No evidence of dislocation. Acute nondisplaced fractures are seen involving the right superior and inferior pubic rami, which are new since previous study. Ligaments Suboptimally assessed by CT. Muscles and Tendons Unremarkable. Soft tissues Unremarkable. IMPRESSION: Acute nondisplaced fractures of the right superior and inferior pubic rami, new since previous  study. Healing right femoral neck fracture, status post internal fixation. Electronically Signed   By: Marlaine Hind M.D.   On: 01/26/2022 15:03   DG Hip Unilat  With Pelvis 2-3 Views Right  Result Date: 01/26/2022 CLINICAL DATA:  60 year old male with history of trauma from a fall. Right hip and pelvic pain. EXAM: DG HIP (WITH OR WITHOUT PELVIS) 2-3V RIGHT COMPARISON:  11/02/2021. FINDINGS: Postoperative changes of ORIF are noted in the right proximal femur. Healing right femoral neck fracture again noted. Bony pelvis and visualized portions of the left proximal femur appear intact. Right femoral head is located. Joint space narrowing, subchondral sclerosis and osteophyte formation in the hip joints bilaterally indicative of osteoarthritis. Numerous vascular calcifications are noted. IMPRESSION: 1. No acute radiographic abnormality of the bony pelvis or the right hip. 2. Status post ORIF in the right hip with healing transcervical right femoral neck fracture. Electronically Signed   By: Vinnie Langton M.D.   On: 01/26/2022 10:55

## 2022-01-26 NOTE — ED Notes (Signed)
Call was placed to floor to see if RN was ready to receive the patient. RN is in an isolation room.

## 2022-01-26 NOTE — H&P (Addendum)
History and Physical    [name] HYW:[id number] DOB: [birth year] DOA: 01/26/2022  PCP: [facility] [place]  Patient coming from: Home.  Chief Complaint: Fall.  HPI: Bobby Sampson is a 60 y.o. male with a of ESRD on hemodialysis Monday Wednesday Friday, diabetes mellitus type 2 uncontrolled, hypertension, history of stroke, anemia had a fall at home when patient was walking with his walker and tripped on a rug.  Did not hit his head or lose consciousness did not have any chest pain or shortness of breath.  He fell onto his back and hurt his right side of his hip area.  ED Course: CT pelvis shows acute right superior and inferior pubic Ramey fracture.  Orthopedic surgeon on-call Dr. Mack Guise was consulted.  Advised nonsurgical management at this time.  Labs are largely at baseline.  Patient admitted for further pain management and physical therapy.  COVID test negative.  Review of Systems: As per HPI, rest all negative.   Past Medical History:  Diagnosis Date   Diabetes mellitus without complication ([place])    Hypertension    Neuropathy    Renal disorder    Stroke [facility])    2016    Past Surgical History:  Procedure Laterality Date   INTRAMEDULLARY (IM) NAIL INTERTROCHANTERIC Right 11/02/2021   Procedure: INTRAMEDULLARY (IM) NAIL INTERTROCHANTRIC;  Surgeon: Thornton Park, MD;  Location: [facility] ORS;  Service: Orthopedics;  Laterality: Right;   TONSILLECTOMY       reports that he has never smoked. He has never used smokeless tobacco. He reports that he does not currently use alcohol. He reports that he does not currently use drugs.  Allergies  Allergen Reactions   Liraglutide Nausea And Vomiting    Other reaction(s): Nausea and vomiting, Fatigue   Trulicity [Dulaglutide] Nausea And Vomiting    Family History  Problem Relation Age of Onset   Hypertension Mother    Diabetes Mother    Kidney disease Mother     Prior to Admission medications   Medication Sig Start  Date End Date Taking? Authorizing Provider  amLODipine (NORVASC) 2.5 MG tablet Take 2.5 mg by mouth daily. 01/21/22  Yes [provider]  aspirin EC 81 MG tablet Take 1 tablet (81 mg total) by mouth daily. 11/06/21  Yes Enzo Bi, MD  atorvastatin (LIPITOR) 80 MG tablet Take 80 mg by mouth daily.   Yes [provider]  buPROPion (WELLBUTRIN XL) 150 MG 24 hr tablet Take 150 mg by mouth daily. 10/15/21  Yes [provider]  calcium acetate (PHOSLO) 667 MG capsule Take 667 mg by mouth 3 (three) times daily. 11/26/21  Yes [provider]  cholecalciferol (VITAMIN D) 25 MCG (1000 UT) tablet Take 2,000 Units by mouth daily.   Yes [provider]  dorzolamide-timolol (COSOPT) 22.3-6.8 MG/ML ophthalmic solution Place 1 drop into the left eye 2 (two) times daily. 01/23/22  Yes [provider]  FLUoxetine (PROZAC) 20 MG capsule Take 20 mg by mouth daily. 08/08/21  Yes [provider]  insulin glargine (LANTUS SOLOSTAR) 100 UNIT/ML Solostar Pen Inject 20 Units into the skin every 12 (twelve) hours. 02/16/21  Yes Wieting, Richard, MD  latanoprost (XALATAN) 0.005 % ophthalmic solution Place 1 drop into both eyes at bedtime. 12/21/20  Yes [provider]  loratadine (CLARITIN) 10 MG tablet Take 10 mg by mouth daily.   Yes [provider]  melatonin 3 MG TABS tablet Take 6 mg by mouth at bedtime.   Yes [provider]  metoprolol succinate (TOPROL-XL) 25 MG 24 hr tablet Take 2 tablets (50 mg total) by mouth daily. Patient taking differently: Take 25 mg by mouth daily. 11/06/21  Yes Enzo Bi, MD  NOVOLOG FLEXPEN 100 UNIT/ML FlexPen Inject 10 Units into the skin 3 (three) times daily with meals. 01/02/21  Yes [provider]  ondansetron (ZOFRAN-ODT) 8 MG disintegrating tablet Take 8 mg by mouth every 8 (eight) hours as needed for nausea. 08/08/21  Yes [provider]  polyethylene glycol (MIRALAX / GLYCOLAX) 17 g packet  Take 17 g by mouth daily. 11/06/21  Yes Enzo Bi, MD  amLODipine (NORVASC) 10 MG tablet Take 1 tablet (10 mg total) by mouth daily. Patient not taking: Reported on 01/26/2022 11/06/21   Enzo Bi, MD  ciprofloxacin (CILOXAN) 0.3 % ophthalmic solution Place 1 drop into the right eye 4 (four) times daily. 01/23/22   [provider]  midodrine (PROAMATINE) 2.5 MG tablet Take 2.5 mg by mouth as directed. 12/15/21   [provider]  multivitamin (RENA-VIT) TABS tablet Take 1 tablet by mouth at bedtime. 11/06/21   Enzo Bi, MD  pantoprazole (PROTONIX) 40 MG tablet Take 1 tablet (40 mg total) by mouth daily. 02/16/21 11/02/21  Loletha Grayer, MD  prednisoLONE acetate (PRED FORTE) 1 % ophthalmic suspension Place into the right eye. 01/23/22   [provider]    Physical Exam: Constitutional: Moderately built and nourished. Vitals:   01/26/22 1628 01/26/22 1630 01/26/22 1740 01/26/22 1843  BP: (!) 194/92 (!) 194/92 (!) 165/66 (!) 145/62  Pulse:  86  80  Resp:    18  Temp:      TempSrc:      SpO2:  90% 95% 95%  Weight:      Height:       Eyes: Anicteric no pallor.  Has poor vision on the right eye. ENMT: No discharge from the ears eyes nose and mouth. Neck: No mass felt.  No neck rigidity. Respiratory: No rhonchi or crepitations. Cardiovascular: S1-S2 heard. Abdomen: Soft nontender bowel sound present. Musculoskeletal: Pain on moving right hip. Skin: No obvious ecchymotic areas seen. Neurologic: Alert awake oriented time place and person.  Moves all extremities. Psychiatric: Appears normal.  Normal affect.   Labs on Admission: I have personally reviewed following labs and imaging studies  CBC: Recent Labs  Lab 01/26/22 1601  WBC 9.8  NEUTROABS 8.2*  HGB 12.0*  HCT 38.0*  MCV 83.3  PLT 098   Basic Metabolic Panel: Recent Labs  Lab 01/26/22 1601  NA 138  K 3.9  CL 97*  CO2 30  GLUCOSE 151*  BUN 34*  CREATININE 4.34*  CALCIUM 9.0   GFR: Estimated  Creatinine Clearance: 20.5 mL/min (A) (by C-G formula based on SCr of 4.34 mg/dL (H)). Liver Function Tests: No results for input(s): AST, ALT, ALKPHOS, BILITOT, PROT, ALBUMIN in the last 168 hours. No results for input(s): LIPASE, AMYLASE in the last 168 hours. No results for input(s): AMMONIA in the last 168 hours. Coagulation Profile: No results for input(s): INR, PROTIME in the last 168 hours. Cardiac Enzymes: No results for input(s): CKTOTAL, CKMB, CKMBINDEX, TROPONINI in the last 168 hours. BNP (last 3 results) No results for input(s): PROBNP in the last 8760 hours. HbA1C: No results for input(s): HGBA1C in the last 72 hours. CBG: No results for input(s): GLUCAP in the last 168 hours. Lipid Profile: No results for input(s): CHOL, HDL, LDLCALC, TRIG, CHOLHDL, LDLDIRECT in the last 72 hours. Thyroid  Function Tests: No results for input(s): TSH, T4TOTAL, FREET4, T3FREE, THYROIDAB in the last 72 hours. Anemia Panel: No results for input(s): VITAMINB12, FOLATE, FERRITIN, TIBC, IRON, RETICCTPCT in the last 72 hours. Urine analysis:    Component Value Date/Time   COLORURINE STRAW (A) 02/15/2021 1600   APPEARANCEUR CLEAR (A) 02/15/2021 1600   LABSPEC 1.008 02/15/2021 1600   PHURINE 5.0 02/15/2021 1600   GLUCOSEU >=500 (A) 02/15/2021 1600   HGBUR NEGATIVE 02/15/2021 1600   BILIRUBINUR NEGATIVE 02/15/2021 1600   KETONESUR NEGATIVE 02/15/2021 1600   PROTEINUR 100 (A) 02/15/2021 1600   NITRITE NEGATIVE 02/15/2021 1600   LEUKOCYTESUR NEGATIVE 02/15/2021 1600   Sepsis Labs: @LABRCNTIP (procalcitonin:4,lacticidven:4) ) Recent Results (from the past 240 hour(s))  Resp Panel by RT-PCR (Flu A&B, Covid) Nasopharyngeal Swab     Status: None   Collection Time: 01/26/22  4:01 PM   Specimen: Nasopharyngeal Swab; Nasopharyngeal(NP) swabs in vial transport medium  Result Value Ref Range Status   SARS Coronavirus 2 by RT PCR NEGATIVE NEGATIVE Final    Comment: (NOTE) SARS-CoV-2 target  nucleic acids are NOT DETECTED.  The SARS-CoV-2 RNA is generally detectable in upper respiratory specimens during the acute phase of infection. The lowest concentration of SARS-CoV-2 viral copies this assay can detect is 138 copies/mL. A negative result does not preclude SARS-Cov-2 infection and should not be used as the sole basis for treatment or other patient management decisions. A negative result may occur with  improper specimen collection/handling, submission of specimen other than nasopharyngeal swab, presence of viral mutation(s) within the areas targeted by this assay, and inadequate number of viral copies(<138 copies/mL). A negative result must be combined with clinical observations, patient history, and epidemiological information. The expected result is Negative.  Fact Sheet for Patients:  EntrepreneurPulse.com.au  Fact Sheet for Healthcare Providers:  IncredibleEmployment.be  This test is no t yet approved or cleared by the [place] FDA and  has been authorized for detection and/or diagnosis of SARS-CoV-2 by FDA under an Emergency Use Authorization (EUA). This EUA will remain  in effect (meaning this test can be used) for the duration of the COVID-19 declaration under Section 564(b)(1) of the Act, 21 U.S.C.section 360bbb-3(b)(1), unless the authorization is terminated  or revoked sooner.       Influenza A by PCR NEGATIVE NEGATIVE Final   Influenza B by PCR NEGATIVE NEGATIVE Final    Comment: (NOTE) The Xpert Xpress SARS-CoV-2/FLU/RSV plus assay is intended as an aid in the diagnosis of influenza from Nasopharyngeal swab specimens and should not be used as a sole basis for treatment. Nasal washings and aspirates are unacceptable for Xpert Xpress SARS-CoV-2/FLU/RSV testing.  Fact Sheet for Patients: EntrepreneurPulse.com.au  Fact Sheet for Healthcare  Providers: IncredibleEmployment.be  This test is not yet approved or cleared by the [place] FDA and has been authorized for detection and/or diagnosis of SARS-CoV-2 by FDA under an Emergency Use Authorization (EUA). This EUA will remain in effect (meaning this test can be used) for the duration of the COVID-19 declaration under Section 564(b)(1) of the Act, 21 U.S.C. section 360bbb-3(b)(1), unless the authorization is terminated or revoked.  Performed at [facility], [address], [place], [place] [address]      Radiological Exams on Admission: CT Hip Right Wo Contrast  Result Date: 01/26/2022 CLINICAL DATA:  Fall. Severe right hip pain. Recent internal fixation of right hip fracture proximally 3 months ago. EXAM: CT OF THE RIGHT HIP WITHOUT CONTRAST TECHNIQUE: Multidetector CT imaging of the  right hip was performed according to the standard protocol. Multiplanar CT image reconstructions were also generated. RADIATION DOSE REDUCTION: This exam was performed according to the departmental dose-optimization program which includes automated exposure control, adjustment of the mA and/or kV according to patient size and/or use of iterative reconstruction technique. COMPARISON:  11/02/2021 FINDINGS: Bones/Joint/Cartilage Lag screw and intramedullary rod are seen transfixing a basicervical right femoral neck fracture. Fracture shows decreased displacement and early healing since prior study. No new fracture is seen involving the right hip. No evidence of dislocation. Acute nondisplaced fractures are seen involving the right superior and inferior pubic rami, which are new since previous study. Ligaments Suboptimally assessed by CT. Muscles and Tendons Unremarkable. Soft tissues Unremarkable. IMPRESSION: Acute nondisplaced fractures of the right superior and inferior pubic rami, new since previous study. Healing right femoral neck fracture, status post internal fixation.  Electronically Signed   By: Marlaine Hind M.D.   On: 01/26/2022 15:03   DG Hip Unilat  With Pelvis 2-3 Views Right  Result Date: 01/26/2022 CLINICAL DATA:  61 year old male with history of trauma from a fall. Right hip and pelvic pain. EXAM: DG HIP (WITH OR WITHOUT PELVIS) 2-3V RIGHT COMPARISON:  11/02/2021. FINDINGS: Postoperative changes of ORIF are noted in the right proximal femur. Healing right femoral neck fracture again noted. Bony pelvis and visualized portions of the left proximal femur appear intact. Right femoral head is located. Joint space narrowing, subchondral sclerosis and osteophyte formation in the hip joints bilaterally indicative of osteoarthritis. Numerous vascular calcifications are noted. IMPRESSION: 1. No acute radiographic abnormality of the bony pelvis or the right hip. 2. Status post ORIF in the right hip with healing transcervical right femoral neck fracture. Electronically Signed   By: Vinnie Langton M.D.   On: 01/26/2022 10:55      Assessment/Plan Principal Problem:   Pelvic fracture (HCC) Active Problems:   Essential hypertension   Type II diabetes mellitus with renal manifestations (HCC)   ESRD on dialysis ([place])   Anemia in ESRD (end-stage renal disease) ([place])    Acute right superior and inferior pubic rami fracture after mechanical fall.  Appreciate orthopedic surgery input.  At this time plan is to manage nonsurgically.  Physical therapy consult pain relief medications. Diabetes mellitus type 2 on Lantus insulin 20 units twice daily with NovoLog premeals with sliding scale coverage.  As per the patient's wife with whom I discussed patient has been having high blood sugars.  Closely monitor. Hypertensive urgency with systolic blood pressure more than 190 was given his home medication in the ER.  We will continue with amlodipine and beta-blockers.  We will keep patient on as needed IV hydralazine. ESRD on hemodialysis on Monday Wednesday and Friday we will  consult nephrology for dialysis. Anemia likely from ESRD follow CBC. Prior history of stroke on aspirin and statins. History of glaucoma with poor vision in the right eye.   DVT prophylaxis: Heparin. Code Status: Full code. Family Communication: Patient's wife. Disposition Plan: May need rehab. Consults called: Orthopedics and physical therapy. Admission status: Observation.   Rise Patience MD [facility] Pager [phone].  If 7PM-7AM, please contact night-coverage [email or web address] Password [facility]  01/26/2022, 6:54 PM

## 2022-01-26 NOTE — ED Triage Notes (Signed)
Pt reports his walker got hung on a rig last pm and he fell on his right buttocks. Pt c/o pain to right hip and pelvic area. Pt denies LOC. Pt reports attempted to walk on it but is not able to. Pt reports broke that same hip before and has 2 rods in place

## 2022-01-26 NOTE — ED Triage Notes (Signed)
Pt in via EMS from home with c/o fall. EMS reports pt with mechanical fall after walker got stuck on a rug at 6pm yesterday. Pain to right leg in pelvis. Pt had rod placed in right hip in December. 98% RA, HR 81, 184/86, FSBS 239, 98.5 temp.

## 2022-01-26 NOTE — ED Provider Notes (Signed)
[facility] Provider Note    Event Date/Time   First MD Initiated Contact with Patient 01/26/22 1221     (approximate)   History   Fall and Hip Pain   HPI  Bobby Sampson is a 61 y.o. male   with medical history significant of ESRD-HD (MWF), HTN, HLD, DM, stroke with right sided weakness, depression, peripheral neuropathy, OSA not using CPAP consistently, dCHF and recent admission in December of last year for treatment of a right hip fracture status post intramedullary fixation who presents for evaluation of right hip pain after another fall that occurred yesterday.  Patient states he was using a cane when it got caught on the end of a rug causing her to fall onto the right hip.  States he has had significant pain and inability to completely bear weight like he had been before the fall in the right hip.  He denies hitting anywhere else including his head, neck and has no other pain in the extremities although feels like it radiates down to the knee.  He has history of chronic nausea and vomiting which he attributes to his dialysis without any acute changes today and denies any other acute physical sick symptoms including chest pain, cough, diarrhea, change in urine output, abdominal pain, back pain, chest pain, dizziness or any other acute concerns.  No analgesia immediately prior to arrival.  No other acute concerns at this time.  No recently missed dialysis.      Physical Exam  Triage Vital Signs: ED Triage Vitals  Enc Vitals Group     BP 01/26/22 1014 (!) 163/76     Pulse Rate 01/26/22 1014 82     Resp 01/26/22 1014 20     Temp 01/26/22 1014 98.6 F (37 C)     Temp Source 01/26/22 1014 Oral     SpO2 01/26/22 1014 94 %     Weight 01/26/22 1013 220 lb (99.8 kg)     Height 01/26/22 1013 5\' 9"  (1.753 m)     Head Circumference --      Peak Flow --      Pain Score 01/26/22 1012 10     Pain Loc --      Pain Edu? --      Excl. in [place]? --     Most recent vital  signs: Vitals:   01/26/22 1630 01/26/22 1740  BP: (!) 194/92 (!) 165/66  Pulse: 86   Resp:    Temp:    SpO2: 90% 95%    General: Awake, no distress.  CV:  Good peripheral perfusion.  2+ radial pulses. Resp:  Normal effort.  Clear bilaterally. Abd:  No distention.  Soft throughout. Other:  Cranial nerves II through XII are grossly intact.  No C-spine tenderness.  Patient has symmetric strength in the bilateral upper extremities and has full strength of the left lower extremity.  Stable to flex and extend at the right knee and dorsi and plantarflex at the right ankle but has significant limitation range of motion at the right hip.  He is tender over the medial anterior lateral aspect of the right hip.  No significant overlying skin changes.  2+ bilateral PT pulses.  Sensation is intact light touch throughout the bilateral lower extremities.   ED Results / Procedures / Treatments  Labs (all labs ordered are listed, but only abnormal results are displayed) Labs Reviewed  CBC WITH DIFFERENTIAL/PLATELET - Abnormal; Notable for the following components:  Result Value   Hemoglobin 12.0 (*)    HCT 38.0 (*)    Neutro Abs 8.2 (*)    All other components within normal limits  BASIC METABOLIC PANEL - Abnormal; Notable for the following components:   Chloride 97 (*)    Glucose, Bld 151 (*)    BUN 34 (*)    Creatinine, Ser 4.34 (*)    GFR, Estimated 15 (*)    All other components within normal limits  RESP PANEL BY RT-PCR (FLU A&B, COVID) [id number]  HEMOGLOBIN A1C     EKG   RADIOLOGY X-ray of the right hip on my interpretation shows no clear acute fracture dislocation but evidence of prior ORIF and previous cervical neck fracture that appears to be healing.  I also reviewed radiology's interpretation and agree with their findings of no acute fracture.  CT right hip on my interpretation shows what appears to be a nondisplaced acute superior and inferior pubic rami fractures without any  other new fracture in the proximal femur.  I also reviewed radiology's interpretation and agree with their findings of same without other acute process.   PROCEDURES:  Critical Care performed: No  Procedures    MEDICATIONS ORDERED IN ED: Medications  amLODipine (NORVASC) tablet 10 mg (10 mg Oral Given 01/26/22 1628)  atorvastatin (LIPITOR) tablet 80 mg (has no administration in time range)  ondansetron (ZOFRAN-ODT) disintegrating tablet 8 mg (has no administration in time range)  metoprolol succinate (TOPROL-XL) 24 hr tablet 50 mg (has no administration in time range)  insulin aspart (novoLOG) injection 0-15 Units (has no administration in time range)  insulin aspart (novoLOG) injection 0-5 Units (has no administration in time range)  oxyCODONE (Oxy IR/ROXICODONE) immediate release tablet 5 mg (5 mg Oral Given 01/26/22 1410)  oxyCODONE (Oxy IR/ROXICODONE) immediate release tablet 5 mg (5 mg Oral Given 01/26/22 1635)  acetaminophen (TYLENOL) tablet 1,000 mg (1,000 mg Oral Given 01/26/22 1627)     IMPRESSION / MDM / ASSESSMENT AND PLAN / ED COURSE  I reviewed the triage vital signs and the nursing notes.                              Differential diagnosis includes, but is not limited to contusion, hematoma and possible underlying fracture.  Patient is neurovascular intact distally can have a low suspicion for an acute arterial injury.  Low suspicion for compartment syndrome.  Patient describes a clear mechanical fall and have low suspicion for syncope.  X-ray of the right hip on my interpretation shows no clear acute fracture dislocation but evidence of prior ORIF and previous cervical neck fracture that appears to be healing.  I also reviewed radiology's interpretation and agree with their findings of no acute fracture.  CT right hip on my interpretation shows what appears to be a nondisplaced acute superior and inferior pubic rami fractures without any other new fracture in the proximal  femur.  I also reviewed radiology's interpretation and agree with their findings of same without other acute process.  Discussed acute rami fractures with on-call orthopedist Dr. Mack Guise who recommended no acute surgical intervention and weightbearing as tolerated and agreed with admission for PT OT and pain control.      FINAL CLINICAL IMPRESSION(S) / ED DIAGNOSES   Final diagnoses:  Fall, initial encounter  Closed fracture of right hip, initial encounter [facility])     Rx / DC Orders   ED Discharge Orders  None        Note:  This document was prepared using Dragon voice recognition software and may include unintentional dictation errors.   Lucrezia Starch, MD 01/26/22 [phone]

## 2022-01-27 DIAGNOSIS — Z992 Dependence on renal dialysis: Secondary | ICD-10-CM | POA: Diagnosis not present

## 2022-01-27 DIAGNOSIS — N186 End stage renal disease: Secondary | ICD-10-CM | POA: Diagnosis not present

## 2022-01-27 DIAGNOSIS — S32509A Unspecified fracture of unspecified pubis, initial encounter for closed fracture: Secondary | ICD-10-CM | POA: Diagnosis not present

## 2022-01-27 DIAGNOSIS — S72001A Fracture of unspecified part of neck of right femur, initial encounter for closed fracture: Secondary | ICD-10-CM | POA: Diagnosis not present

## 2022-01-27 LAB — GLUCOSE, CAPILLARY
Glucose-Capillary: 94 mg/dL (ref 70–99)
Glucose-Capillary: 156 mg/dL — ABNORMAL HIGH (ref 70–99)

## 2022-01-27 LAB — HEPATITIS B SURFACE ANTIGEN: Hepatitis B Surface Ag: NONREACTIVE

## 2022-01-27 LAB — HEMOGLOBIN A1C
Hgb A1c MFr Bld: 8.7 % — ABNORMAL HIGH (ref 4.8–5.6)
Mean Plasma Glucose: 203 mg/dL

## 2022-01-27 LAB — PHOSPHORUS: Phosphorus: 3.1 mg/dL (ref 2.5–4.6)

## 2022-01-27 LAB — HEPATITIS B SURFACE ANTIBODY,QUALITATIVE: Hep B S Ab: REACTIVE — AB

## 2022-01-27 MED ORDER — OXYCODONE HCL 5 MG PO TABS: 5.0000 mg | ORAL_TABLET | Freq: Four times a day (QID) | ORAL | 0 refills | Status: DC | PRN

## 2022-01-27 MED ORDER — OXYCODONE HCL 5 MG PO TABS
5.0000 mg | ORAL_TABLET | ORAL | Status: DC | PRN
  Administered 2022-01-27: 5 mg via ORAL
  Filled 2022-01-27: qty 1

## 2022-01-27 MED ORDER — HEPARIN SODIUM (PORCINE) 1000 UNIT/ML IJ SOLN
INTRAMUSCULAR | Status: AC
  Filled 2022-01-27: qty 10

## 2022-01-27 NOTE — Progress Notes (Signed)
[facility]  ROUNDING NOTE   Subjective:   Bobby Sampson is a 61 year old male with a past medical history of diabetes, hypertension, stroke, anemia, and end-stage renal disease on hemodialysis.  Patient presents to the emergency room after a fall suffered at home and complaints of right-sided hip pain.  Patient was admitted for Pelvic fracture ([place]) [S32.9XXA] Fall, initial encounter [id number]] Closed fracture of right hip, initial encounter [facility]) [S72.001A]  Patient currently receives outpatient dialysis treatments at [facility] on a MWF schedule.  Patient last received dialysis on Friday, no complications with treatment.  Patient has recently returned home from rehab from a fractured right hip.  Patient states he fell while using his walker, walker got caught on the road in hallway.  Reports lower back and right hip pain.  Denies nausea, vomiting, and diarrhea.  Denies shortness of breath, cough, chest pain, abdominal pain and discomfort.  Labs on arrival acceptable for renal patient.  Respiratory panel negative for influenza and COVID-19.  Right hip x-ray shows status post ORIF of right hip.  Right hip CT shows acute nondisplaced fractures of the right superior and inferior pubic rami.  We have been consulted to manage dialysis needs during this admission  Objective:  Vital signs in last 24 hours:  Temp:  [98.1 F (36.7 C)-98.7 F (37.1 C)] 98.1 F (36.7 C) (02/27 0821) Pulse Rate:  [76-86] 80 (02/27 0821) Resp:  [16-18] 17 (02/27 0821) BP: (136-194)/(59-92) 163/91 (02/27 0821) SpO2:  [90 %-99 %] 95 % (02/27 0821) Weight:  [101 kg] 101 kg (02/27 0452)  Weight change:  Filed Weights   01/26/22 1013 01/27/22 0452  Weight: 99.8 kg 101 kg    Intake/Output: No intake/output data recorded.   Intake/Output this shift:  No intake/output data recorded.  Physical Exam: General: NAD, resting comfortably  Head: Normocephalic, atraumatic. Moist oral mucosal  membranes  Eyes: Anicteric  Lungs:  Clear to auscultation, normal effort  Heart: Regular rate and rhythm  Abdomen:  Soft, nontender, obese  Extremities: Trace peripheral edema.  Neurologic: Nonfocal, moving all four extremities  Skin: No lesions  Access: Right PermCath    Basic Metabolic Panel: Recent Labs  Lab 01/26/22 1601  NA 138  K 3.9  CL 97*  CO2 30  GLUCOSE 151*  BUN 34*  CREATININE 4.34*  CALCIUM 9.0    Liver Function Tests: No results for input(s): AST, ALT, ALKPHOS, BILITOT, PROT, ALBUMIN in the last 168 hours. No results for input(s): LIPASE, AMYLASE in the last 168 hours. No results for input(s): AMMONIA in the last 168 hours.  CBC: Recent Labs  Lab 01/26/22 1601  WBC 9.8  NEUTROABS 8.2*  HGB 12.0*  HCT 38.0*  MCV 83.3  PLT 186    Cardiac Enzymes: No results for input(s): CKTOTAL, CKMB, CKMBINDEX, TROPONINI in the last 168 hours.  BNP: Invalid input(s): POCBNP  CBG: Recent Labs  Lab 01/26/22 2111 01/27/22 0824 01/27/22 1133  GLUCAP 145* 94 156*    Microbiology: Results for orders placed or performed during the hospital encounter of 01/26/22  Resp Panel by RT-PCR (Flu A&B, Covid) Nasopharyngeal Swab     Status: None   Collection Time: 01/26/22  4:01 PM   Specimen: Nasopharyngeal Swab; Nasopharyngeal(NP) swabs in vial transport medium  Result Value Ref Range Status   SARS Coronavirus 2 by RT PCR NEGATIVE NEGATIVE Final    Comment: (NOTE) SARS-CoV-2 target nucleic acids are NOT DETECTED.  The SARS-CoV-2 RNA is generally detectable in upper respiratory  specimens during the acute phase of infection. The lowest concentration of SARS-CoV-2 viral copies this assay can detect is 138 copies/mL. A negative result does not preclude SARS-Cov-2 infection and should not be used as the sole basis for treatment or other patient management decisions. A negative result may occur with  improper specimen collection/handling, submission of specimen  other than nasopharyngeal swab, presence of viral mutation(s) within the areas targeted by this assay, and inadequate number of viral copies(<138 copies/mL). A negative result must be combined with clinical observations, patient history, and epidemiological information. The expected result is Negative.  Fact Sheet for Patients:  EntrepreneurPulse.com.au  Fact Sheet for Healthcare Providers:  IncredibleEmployment.be  This test is no t yet approved or cleared by the [place] FDA and  has been authorized for detection and/or diagnosis of SARS-CoV-2 by FDA under an Emergency Use Authorization (EUA). This EUA will remain  in effect (meaning this test can be used) for the duration of the COVID-19 declaration under Section 564(b)(1) of the Act, 21 U.S.C.section 360bbb-3(b)(1), unless the authorization is terminated  or revoked sooner.       Influenza A by PCR NEGATIVE NEGATIVE Final   Influenza B by PCR NEGATIVE NEGATIVE Final    Comment: (NOTE) The Xpert Xpress SARS-CoV-2/FLU/RSV plus assay is intended as an aid in the diagnosis of influenza from Nasopharyngeal swab specimens and should not be used as a sole basis for treatment. Nasal washings and aspirates are unacceptable for Xpert Xpress SARS-CoV-2/FLU/RSV testing.  Fact Sheet for Patients: EntrepreneurPulse.com.au  Fact Sheet for Healthcare Providers: IncredibleEmployment.be  This test is not yet approved or cleared by the [place] FDA and has been authorized for detection and/or diagnosis of SARS-CoV-2 by FDA under an Emergency Use Authorization (EUA). This EUA will remain in effect (meaning this test can be used) for the duration of the COVID-19 declaration under Section 564(b)(1) of the Act, 21 U.S.C. section 360bbb-3(b)(1), unless the authorization is terminated or revoked.  Performed at [facility], [address]  [place], [place] [address]     Coagulation Studies: No results for input(s): LABPROT, INR in the last 72 hours.  Urinalysis: No results for input(s): COLORURINE, LABSPEC, PHURINE, GLUCOSEU, HGBUR, BILIRUBINUR, KETONESUR, PROTEINUR, UROBILINOGEN, NITRITE, LEUKOCYTESUR in the last 72 hours.  Invalid input(s): APPERANCEUR    Imaging: CT Hip Right Wo Contrast  Result Date: 01/26/2022 CLINICAL DATA:  Fall. Severe right hip pain. Recent internal fixation of right hip fracture proximally 3 months ago. EXAM: CT OF THE RIGHT HIP WITHOUT CONTRAST TECHNIQUE: Multidetector CT imaging of the right hip was performed according to the standard protocol. Multiplanar CT image reconstructions were also generated. RADIATION DOSE REDUCTION: This exam was performed according to the departmental dose-optimization program which includes automated exposure control, adjustment of the mA and/or kV according to patient size and/or use of iterative reconstruction technique. COMPARISON:  11/02/2021 FINDINGS: Bones/Joint/Cartilage Lag screw and intramedullary rod are seen transfixing a basicervical right femoral neck fracture. Fracture shows decreased displacement and early healing since prior study. No new fracture is seen involving the right hip. No evidence of dislocation. Acute nondisplaced fractures are seen involving the right superior and inferior pubic rami, which are new since previous study. Ligaments Suboptimally assessed by CT. Muscles and Tendons Unremarkable. Soft tissues Unremarkable. IMPRESSION: Acute nondisplaced fractures of the right superior and inferior pubic rami, new since previous study. Healing right femoral neck fracture, status post internal fixation. Electronically Signed   By: Marlaine Hind M.D.   On:  01/26/2022 15:03   DG Hip Unilat  With Pelvis 2-3 Views Right  Result Date: 01/26/2022 CLINICAL DATA:  61 year old male with history of trauma from a fall. Right hip and pelvic pain. EXAM: DG HIP (WITH OR  WITHOUT PELVIS) 2-3V RIGHT COMPARISON:  11/02/2021. FINDINGS: Postoperative changes of ORIF are noted in the right proximal femur. Healing right femoral neck fracture again noted. Bony pelvis and visualized portions of the left proximal femur appear intact. Right femoral head is located. Joint space narrowing, subchondral sclerosis and osteophyte formation in the hip joints bilaterally indicative of osteoarthritis. Numerous vascular calcifications are noted. IMPRESSION: 1. No acute radiographic abnormality of the bony pelvis or the right hip. 2. Status post ORIF in the right hip with healing transcervical right femoral neck fracture. Electronically Signed   By: Vinnie Langton M.D.   On: 01/26/2022 10:55     Medications:     amLODipine  10 mg Oral Daily   aspirin EC  81 mg Oral Daily   atorvastatin  80 mg Oral Daily   buPROPion  150 mg Oral Daily   calcium acetate  667 mg Oral TID WC   ciprofloxacin  1 drop Right Eye QID   dorzolamide-timolol  1 drop Left Eye BID   FLUoxetine  20 mg Oral Daily   heparin  5,000 Units Subcutaneous Q8H   insulin aspart  0-9 Units Subcutaneous TID WC   insulin aspart  10 Units Subcutaneous TID WC   insulin glargine-yfgn  20 Units Subcutaneous Q12H   latanoprost  1 drop Both Eyes QHS   melatonin  5 mg Oral QHS   metoprolol succinate  50 mg Oral Daily   pantoprazole  40 mg Oral Daily   polyethylene glycol  17 g Oral Daily   oxyCODONE  Assessment/ Plan:  Bobby Sampson is a 61 y.o.  male with a past medical history of diabetes, hypertension, stroke, anemia, and end-stage renal disease on hemodialysis.  Patient presents to the emergency room after a fall suffered at home and complaints of right-sided hip pain.  Patient was admitted for Pelvic fracture ([place]) [S32.9XXA] Fall, initial encounter [id number]] Closed fracture of right hip, initial encounter ([place]) [S72.001A]  [facility]/MWF/right PermCath/98 kg  Anemia of chronic kidney disease Lab  Results  Component Value Date   HGB 12.0 (L) 01/26/2022  Hemoglobin remains within desired goal.  2.  End-stage renal disease on hemodialysis.  Scheduled to receive dialysis today to maintain outpatient schedule.  UF goal 3 L as tolerated.  Next treatment scheduled for Wednesday.  Dialysis coordinator aware of patient and awaiting outpatient  3. Secondary Hyperparathyroidism:  Lab Results  Component Value Date   CALCIUM 9.0 01/26/2022   PHOS 4.1 11/05/2021  Calcium and phosphorus remain within acceptable range Continue calcium acetate with meals  4.  Hypertension with chronic kidney disease.  Home regimen includes amlodipine and metoprolol.  Currently receiving these medications.  5.  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 Hgb A1c 11.0 on 02/16/21.   LOS: 0 Damaree Sargent 2/27/20231:23 PM

## 2022-01-27 NOTE — Progress Notes (Addendum)
Patient was evaluated by PT, recommend nursing home placement.   Talked with patient again, he refused SNF, adamant to go home. He has help at home, will discharge as planned. Send home care and PT.

## 2022-01-27 NOTE — Progress Notes (Signed)
Patient completes 3-hour hemodialysis treatment without incident, right subclavian CVC functions well. VSS throughout treatment. Targeted UF met, with 2-liter fluid removal, adjusted per Nephrologist.    Patient transferred to assigned room, report given to primary nurse.

## 2022-01-27 NOTE — Progress Notes (Signed)
Met with the patient in the room to discuss DC plan and needs He lives a t home with his wife, I explained that it is recommended that he go to SNF for STR He refuses to go to the facility and wants to go home with [facility], I set him up with [facility], they will call him to arrange He has a rw and a 3 in 1 at home His wife provides transportation

## 2022-01-27 NOTE — Evaluation (Signed)
Physical Therapy Evaluation Patient Details Name: Bobby Sampson MRN: [id number] DOB: 1961-05-26 Today's Date: 01/27/2022  History of Present Illness  Pt is a 61 y.o. male with a of ESRD on hemodialysis Monday Wednesday Friday, diabetes mellitus type 2 uncontrolled, hypertension, history of stroke, anemia had a fall at home when patient was walking with his walker and tripped on a rug. MD assessment includes: Fall without syncope with acute right superior and inferior pubic rami fractures and hypertensive urgency.   Clinical Impression  Pt was pleasant and motivated to participate during the session and put forth good effort throughout but ultimately was limited functionally primarily by pain.  Pt required extensive physical assistance with all functional tasks and despite multiple attempts was only able to amb around 1 foot at the EOB with a shuffling pattern.  Pt attempted lateral scoot at EOB but was unable to do so. Pt will benefit from PT services in a SNF setting upon discharge to safely address deficits listed in patient problem list for decreased caregiver assistance and eventual return to PLOF.         Recommendations for follow up therapy are one component of a multi-disciplinary discharge planning process, led by the attending physician.  Recommendations may be updated based on patient status, additional functional criteria and insurance authorization.  Follow Up Recommendations Skilled nursing-short term rehab (<3 hours/day)    Assistance Recommended at Discharge Frequent or constant Supervision/Assistance  Patient can return home with the following  Two people to help with walking and/or transfers;A lot of help with bathing/dressing/bathroom;Assistance with cooking/housework;Direct supervision/assist for medications management;Help with stairs or ramp for entrance;Assist for transportation    Equipment Recommendations None recommended by PT  Recommendations for Other Services        Functional Status Assessment Patient has had a recent decline in their functional status and demonstrates the ability to make significant improvements in function in a reasonable and predictable amount of time.     Precautions / Restrictions Precautions Precautions: Fall Restrictions Weight Bearing Restrictions: No      Mobility  Bed Mobility Overal bed mobility: Needs Assistance Bed Mobility: Sit to Supine, Supine to Sit     Supine to sit: Max assist, +2 for physical assistance Sit to supine: Max assist, +2 for physical assistance   General bed mobility comments: Heavy assist for BLE and trunk control    Transfers Overall transfer level: Needs assistance Equipment used: Rolling walker (2 wheels) Transfers: Sit to/from Stand Sit to Stand: Mod assist, From elevated surface           General transfer comment: Mod verbal cues for hand placement    Ambulation/Gait Ambulation/Gait assistance: Min assist Gait Distance (Feet): 1 Feet Assistive device: Rolling walker (2 wheels) Gait Pattern/deviations: Decreased stance time - right, Trunk flexed, Step-to pattern, Shuffle Gait velocity: decreased     General Gait Details: Heavy use of BLEs on the RW for support while advancing LLE with mostly a shuffle pattern  Stairs            Wheelchair Mobility    Modified Rankin (Stroke Patients Only)       Balance Overall balance assessment: Needs assistance, History of Falls   Sitting balance-Leahy Scale: Good     Standing balance support: Bilateral upper extremity supported, During functional activity Standing balance-Leahy Scale: Poor Standing balance comment: Heavy lean on the RW for support  Pertinent Vitals/Pain Pain Assessment Pain Assessment: 0-10 Pain Score: 10-Worst pain ever Pain Location: R hip/groin with WB (0/10 at rest) Pain Descriptors / Indicators: Aching, Grimacing, Guarding, Moaning Pain  Intervention(s): Limited activity within patient's tolerance, Premedicated before session, Monitored during session, Repositioned    Home Living Family/patient expects to be discharged to:: Private residence Living Arrangements: Spouse/significant other;Children Available Help at Discharge: Family;Available 24 hours/day Type of Home: House Home Access: Ramped entrance       Home Layout: One level Home Equipment: Cane - quad;Cane - single point;Rollator (4 wheels);Tub bench;BSC/3in1;Rolling Walker (2 wheels);Wheelchair - manual      Prior Function Prior Level of Function : Independent/Modified Independent             Mobility Comments: Limited community ambulator with a RW, w/c or electric scooter for longer community distances, 2 falls in the last 6 months secondary to tripping and RLE weakness ADLs Comments: Ind with ADLs     Hand Dominance        Extremity/Trunk Assessment   Upper Extremity Assessment Upper Extremity Assessment: Generalized weakness;RUE deficits/detail RUE Deficits / Details: Chronic R-sided weakness from CVA    Lower Extremity Assessment Lower Extremity Assessment: Generalized weakness;RLE deficits/detail RLE Deficits / Details: Chronic R-sided weakness from CVA RLE: Unable to fully assess due to pain       Communication   Communication: No difficulties  Cognition Arousal/Alertness: Awake/alert Behavior During Therapy: WFL for tasks assessed/performed Overall Cognitive Status: Within Functional Limits for tasks assessed                                          General Comments      Exercises Other Exercises Other Exercises: Pt education provided on removal of throw rugs in home   Assessment/Plan    PT Assessment Patient needs continued PT services  PT Problem List Decreased strength;Decreased activity tolerance;Decreased balance;Decreased mobility;Decreased knowledge of use of DME;Pain       PT Treatment  Interventions DME instruction;Gait training;Functional mobility training;Therapeutic activities;Therapeutic exercise;Patient/family education    PT Goals (Current goals can be found in the Care Plan section)  Acute Rehab PT Goals Patient Stated Goal: To walk without pain PT Goal Formulation: With patient Time For Goal Achievement: 02/09/22 Potential to Achieve Goals: Good    Frequency 7X/week     Co-evaluation               AM-PAC PT "6 Clicks" Mobility  Outcome Measure Help needed turning from your back to your side while in a flat bed without using bedrails?: A Lot Help needed moving from lying on your back to sitting on the side of a flat bed without using bedrails?: A Lot Help needed moving to and from a bed to a chair (including a wheelchair)?: A Lot Help needed standing up from a chair using your arms (e.g., wheelchair or bedside chair)?: A Lot Help needed to walk in hospital room?: Total Help needed climbing 3-5 steps with a railing? : Total 6 Click Score: 10    End of Session Equipment Utilized During Treatment: Gait belt Activity Tolerance: Patient limited by pain Patient left: in bed;with call bell/phone within reach;with bed alarm set Nurse Communication: Mobility status PT Visit Diagnosis: History of falling (Z91.81);Unsteadiness on feet (R26.81);Difficulty in walking, not elsewhere classified (R26.2);Muscle weakness (generalized) (M62.81);Pain Pain - Right/Left: Right Pain - part of body: Hip  Time: 0921-0959 PT Time Calculation (min) (ACUTE ONLY): 38 min   Charges:   PT Evaluation $PT Eval Moderate Complexity: 1 Mod          D. Scott Georgie Haque PT, DPT 01/27/22, 1:58 PM

## 2022-01-27 NOTE — Discharge Summary (Signed)
Physician Discharge Summary   Patient: Bobby Sampson MRN: [id number] DOB: 01-19-1961  Admit date:     01/26/2022  Discharge date: 01/27/22  Discharge Physician: Sharen Hones   PCP: [facility]   Recommendations at discharge:   Follow-up with PCP in 1 week. Follow-up with nephrology for continued hemodialysis.  Discharge Diagnoses: Principal Problem:   Pelvic fracture ([place]) Active Problems:   Essential hypertension   Type II diabetes mellitus with renal manifestations ([employer])   ESRD on dialysis ([place])   Anemia in ESRD (end-stage renal disease) ([place])  Resolved Problems:   * No resolved hospital problems. [facility] Course: Bobby Sampson is a 61 y.o. male with a of ESRD on hemodialysis Monday Wednesday Friday, diabetes mellitus type 2 uncontrolled, hypertension, history of stroke, anemia had a fall at home when patient was walking with his walker and tripped on a rug.  Did not hit his head or lose consciousness did not have any chest pain or shortness of breath.  He fell onto his back and hurt his right side of his hip area.CT pelvis shows acute right superior and inferior pubic [place] fracture.  Orthopedic surgeon on-call Dr. Mack Guise was consulted.  Advised nonsurgical management at this time.      Assessment and Plan: Acute right superior and inferior pubic rami fracture. Fall without syncope. Patient doing better today, pain controlled with pain medicine.  Currently medically stable to be discharged.  We will prescribe some as needed pain medicine at time of discharge, please follow-up with family doctor in 1 week  Hypertension urgency Elevated blood pressure secondary to pain.  Blood pressure better.  Continue home medicines. Patient tends to have hypotension during dialysis, on midodrine.  I do not increase blood pressure medicines  End-stage renal disease. Anemia of chronic kidney disease. Patient be dialyzed again today before discharge.         Consultants: Nephrology, orthopedics Procedures performed: HD today  Disposition: Home Diet recommendation:  Discharge Diet Orders (From admission, onward)     Start     Ordered   01/27/22 0000  Diet general       Comments: Renal diet   01/27/22 0938           Renal diet  DISCHARGE MEDICATION: Allergies as of 01/27/2022       Reactions   Liraglutide Nausea And Vomiting   Other reaction(s): Nausea and vomiting, Fatigue   Trulicity [dulaglutide] Nausea And Vomiting        Medication List     TAKE these medications    amLODipine 2.5 MG tablet Commonly known as: NORVASC Take 2.5 mg by mouth daily. What changed: Another medication with the same name was removed. Continue taking this medication, and follow the directions you see here.   aspirin EC 81 MG tablet Take 1 tablet (81 mg total) by mouth daily.   atorvastatin 80 MG tablet Commonly known as: LIPITOR Take 80 mg by mouth daily.   buPROPion 150 MG 24 hr tablet Commonly known as: WELLBUTRIN XL Take 150 mg by mouth daily.   calcium acetate 667 MG capsule Commonly known as: PHOSLO Take 667 mg by mouth 3 (three) times daily.   cholecalciferol 25 MCG (1000 UNIT) tablet Commonly known as: VITAMIN D Take 2,000 Units by mouth daily.   ciprofloxacin 0.3 % ophthalmic solution Commonly known as: CILOXAN Place 1 drop into the right eye 4 (four) times daily.   dorzolamide-timolol 22.3-6.8 MG/ML ophthalmic solution Commonly known as: COSOPT  Place 1 drop into the left eye 2 (two) times daily.   FLUoxetine 20 MG capsule Commonly known as: PROZAC Take 20 mg by mouth daily.   Lantus SoloStar 100 UNIT/ML Solostar Pen Generic drug: insulin glargine Inject 20 Units into the skin every 12 (twelve) hours.   latanoprost 0.005 % ophthalmic solution Commonly known as: XALATAN Place 1 drop into both eyes at bedtime.   loratadine 10 MG tablet Commonly known as: CLARITIN Take 10 mg by mouth daily.    melatonin 3 MG Tabs tablet Take 6 mg by mouth at bedtime.   metoprolol succinate 25 MG 24 hr tablet Commonly known as: TOPROL-XL Take 2 tablets (50 mg total) by mouth daily. What changed: how much to take   midodrine 2.5 MG tablet Commonly known as: PROAMATINE Take 2.5 mg by mouth as directed.   multivitamin Tabs tablet Take 1 tablet by mouth at bedtime.   NovoLOG FlexPen 100 UNIT/ML FlexPen Generic drug: insulin aspart Inject 10 Units into the skin 3 (three) times daily with meals.   ondansetron 8 MG disintegrating tablet Commonly known as: ZOFRAN-ODT Take 8 mg by mouth every 8 (eight) hours as needed for nausea.   oxyCODONE 5 MG immediate release tablet Commonly known as: Oxy IR/ROXICODONE Take 1 tablet (5 mg total) by mouth every 6 (six) hours as needed for severe pain.   pantoprazole 40 MG tablet Commonly known as: Protonix Take 1 tablet (40 mg total) by mouth daily.   polyethylene glycol 17 g packet Commonly known as: MIRALAX / GLYCOLAX Take 17 g by mouth daily.   prednisoLONE acetate 1 % ophthalmic suspension Commonly known as: PRED FORTE Place into the right eye.        Follow-up [place], [facility] Follow up in 1 week(s).   Contact information: [place] [address] [phone]                 Discharge Exam: Filed Weights   01/26/22 1013 01/27/22 0452  Weight: 99.8 kg 101 kg   General exam: Appears calm and comfortable  Respiratory system: Clear to auscultation. Respiratory effort normal. Cardiovascular system: S1 & S2 heard, RRR. No JVD, murmurs, rubs, gallops or clicks. No pedal edema. Gastrointestinal system: Abdomen is nondistended, soft and nontender. No organomegaly or masses felt. Normal bowel sounds heard. Central nervous system: Alert and oriented. No focal neurological deficits. Extremities: Symmetric 5 x 5 power. Skin: No rashes, lesions or ulcers Psychiatry:  Judgement and insight appear normal. Mood & affect appropriate.    Condition at discharge: good  The results of significant diagnostics from this hospitalization (including imaging, microbiology, ancillary and laboratory) are listed below for reference.   Imaging Studies: CT Hip Right Wo Contrast  Result Date: 01/26/2022 CLINICAL DATA:  Fall. Severe right hip pain. Recent internal fixation of right hip fracture proximally 3 months ago. EXAM: CT OF THE RIGHT HIP WITHOUT CONTRAST TECHNIQUE: Multidetector CT imaging of the right hip was performed according to the standard protocol. Multiplanar CT image reconstructions were also generated. RADIATION DOSE REDUCTION: This exam was performed according to the departmental dose-optimization program which includes automated exposure control, adjustment of the mA and/or kV according to patient size and/or use of iterative reconstruction technique. COMPARISON:  11/02/2021 FINDINGS: Bones/Joint/Cartilage Lag screw and intramedullary rod are seen transfixing a basicervical right femoral neck fracture. Fracture shows decreased displacement and early healing since prior study. No new fracture is seen involving the  right hip. No evidence of dislocation. Acute nondisplaced fractures are seen involving the right superior and inferior pubic rami, which are new since previous study. Ligaments Suboptimally assessed by CT. Muscles and Tendons Unremarkable. Soft tissues Unremarkable. IMPRESSION: Acute nondisplaced fractures of the right superior and inferior pubic rami, new since previous study. Healing right femoral neck fracture, status post internal fixation. Electronically Signed   By: Marlaine Hind M.D.   On: 01/26/2022 15:03   DG Hip Unilat  With Pelvis 2-3 Views Right  Result Date: 01/26/2022 CLINICAL DATA:  61 year old male with history of trauma from a fall. Right hip and pelvic pain. EXAM: DG HIP (WITH OR WITHOUT PELVIS) 2-3V RIGHT COMPARISON:  11/02/2021. FINDINGS:  Postoperative changes of ORIF are noted in the right proximal femur. Healing right femoral neck fracture again noted. Bony pelvis and visualized portions of the left proximal femur appear intact. Right femoral head is located. Joint space narrowing, subchondral sclerosis and osteophyte formation in the hip joints bilaterally indicative of osteoarthritis. Numerous vascular calcifications are noted. IMPRESSION: 1. No acute radiographic abnormality of the bony pelvis or the right hip. 2. Status post ORIF in the right hip with healing transcervical right femoral neck fracture. Electronically Signed   By: Vinnie Langton M.D.   On: 01/26/2022 10:55    Microbiology: Results for orders placed or performed during the hospital encounter of 01/26/22  Resp Panel by RT-PCR (Flu A&B, Covid) Nasopharyngeal Swab     Status: None   Collection Time: 01/26/22  4:01 PM   Specimen: Nasopharyngeal Swab; Nasopharyngeal(NP) swabs in vial transport medium  Result Value Ref Range Status   SARS Coronavirus 2 by RT PCR NEGATIVE NEGATIVE Final    Comment: (NOTE) SARS-CoV-2 target nucleic acids are NOT DETECTED.  The SARS-CoV-2 RNA is generally detectable in upper respiratory specimens during the acute phase of infection. The lowest concentration of SARS-CoV-2 viral copies this assay can detect is 138 copies/mL. A negative result does not preclude SARS-Cov-2 infection and should not be used as the sole basis for treatment or other patient management decisions. A negative result may occur with  improper specimen collection/handling, submission of specimen other than nasopharyngeal swab, presence of viral mutation(s) within the areas targeted by this assay, and inadequate number of viral copies(<138 copies/mL). A negative result must be combined with clinical observations, patient history, and epidemiological information. The expected result is Negative.  Fact Sheet for Patients:   EntrepreneurPulse.com.au  Fact Sheet for Healthcare Providers:  IncredibleEmployment.be  This test is no t yet approved or cleared by the [place] FDA and  has been authorized for detection and/or diagnosis of SARS-CoV-2 by FDA under an Emergency Use Authorization (EUA). This EUA will remain  in effect (meaning this test can be used) for the duration of the COVID-19 declaration under Section 564(b)(1) of the Act, 21 U.S.C.section 360bbb-3(b)(1), unless the authorization is terminated  or revoked sooner.       Influenza A by PCR NEGATIVE NEGATIVE Final   Influenza B by PCR NEGATIVE NEGATIVE Final    Comment: (NOTE) The Xpert Xpress SARS-CoV-2/FLU/RSV plus assay is intended as an aid in the diagnosis of influenza from Nasopharyngeal swab specimens and should not be used as a sole basis for treatment. Nasal washings and aspirates are unacceptable for Xpert Xpress SARS-CoV-2/FLU/RSV testing.  Fact Sheet for Patients: EntrepreneurPulse.com.au  Fact Sheet for Healthcare Providers: IncredibleEmployment.be  This test is not yet approved or cleared by the [place] FDA and has been authorized for detection  and/or diagnosis of SARS-CoV-2 by FDA under an Emergency Use Authorization (EUA). This EUA will remain in effect (meaning this test can be used) for the duration of the COVID-19 declaration under Section 564(b)(1) of the Act, 21 U.S.C. section 360bbb-3(b)(1), unless the authorization is terminated or revoked.  Performed at [facility], [address] [place], [place] [address]     Labs: CBC: Recent Labs  Lab 01/26/22 1601  WBC 9.8  NEUTROABS 8.2*  HGB 12.0*  HCT 38.0*  MCV 83.3  PLT 276   Basic Metabolic Panel: Recent Labs  Lab 01/26/22 1601  NA 138  K 3.9  CL 97*  CO2 30  GLUCOSE 151*  BUN 34*  CREATININE 4.34*  CALCIUM 9.0   Liver Function Tests: No results for  input(s): AST, ALT, ALKPHOS, BILITOT, PROT, ALBUMIN in the last 168 hours. CBG: Recent Labs  Lab 01/26/22 2111 01/27/22 0824  GLUCAP 145* 94    Discharge time spent: less than 30 minutes.  Signed: Sharen Hones, MD [facility] 01/27/2022

## 2022-01-28 LAB — HEPATITIS B SURFACE ANTIBODY, QUANTITATIVE: Hep B S AB Quant (Post): >1000 m[IU]/mL (ref >9.9)

## 2022-12-06 ENCOUNTER — Emergency Department: Payer: 59

## 2022-12-06 ENCOUNTER — Inpatient Hospital Stay
Admission: EM | Admit: 2022-12-06 | Discharge: 2022-12-08 | DRG: 073 | Disposition: A | Discharge status: Home / Self Care | Payer: 59 | Attending: Internal Medicine | Admitting: Internal Medicine

## 2022-12-06 DIAGNOSIS — E860 Dehydration: Secondary | ICD-10-CM | POA: Diagnosis present

## 2022-12-06 DIAGNOSIS — N186 End stage renal disease: Secondary | ICD-10-CM | POA: Diagnosis present

## 2022-12-06 DIAGNOSIS — Z992 Dependence on renal dialysis: Secondary | ICD-10-CM

## 2022-12-06 DIAGNOSIS — E1143 Type 2 diabetes mellitus with diabetic autonomic (poly)neuropathy: Principal | ICD-10-CM | POA: Diagnosis present

## 2022-12-06 DIAGNOSIS — Z8249 Family history of ischemic heart disease and other diseases of the circulatory system: Secondary | ICD-10-CM | POA: Diagnosis not present

## 2022-12-06 DIAGNOSIS — R197 Diarrhea, unspecified: Secondary | ICD-10-CM

## 2022-12-06 DIAGNOSIS — G4733 Obstructive sleep apnea (adult) (pediatric): Secondary | ICD-10-CM | POA: Diagnosis not present

## 2022-12-06 DIAGNOSIS — K3184 Gastroparesis: Secondary | ICD-10-CM | POA: Diagnosis present

## 2022-12-06 DIAGNOSIS — E119 Type 2 diabetes mellitus without complications: Secondary | ICD-10-CM

## 2022-12-06 DIAGNOSIS — Z1152 Encounter for screening for COVID-19: Secondary | ICD-10-CM

## 2022-12-06 DIAGNOSIS — D631 Anemia in chronic kidney disease: Secondary | ICD-10-CM | POA: Diagnosis present

## 2022-12-06 DIAGNOSIS — Z888 Allergy status to other drugs, medicaments and biological substances status: Secondary | ICD-10-CM

## 2022-12-06 DIAGNOSIS — E1165 Type 2 diabetes mellitus with hyperglycemia: Secondary | ICD-10-CM | POA: Diagnosis present

## 2022-12-06 DIAGNOSIS — E1151 Type 2 diabetes mellitus with diabetic peripheral angiopathy without gangrene: Secondary | ICD-10-CM | POA: Diagnosis present

## 2022-12-06 DIAGNOSIS — E1142 Type 2 diabetes mellitus with diabetic polyneuropathy: Secondary | ICD-10-CM | POA: Diagnosis present

## 2022-12-06 DIAGNOSIS — I5032 Chronic diastolic (congestive) heart failure: Secondary | ICD-10-CM | POA: Diagnosis not present

## 2022-12-06 DIAGNOSIS — Z841 Family history of disorders of kidney and ureter: Secondary | ICD-10-CM

## 2022-12-06 DIAGNOSIS — N2581 Secondary hyperparathyroidism of renal origin: Secondary | ICD-10-CM | POA: Diagnosis present

## 2022-12-06 DIAGNOSIS — Z7982 Long term (current) use of aspirin: Secondary | ICD-10-CM

## 2022-12-06 DIAGNOSIS — E1122 Type 2 diabetes mellitus with diabetic chronic kidney disease: Secondary | ICD-10-CM | POA: Diagnosis present

## 2022-12-06 DIAGNOSIS — I2510 Atherosclerotic heart disease of native coronary artery without angina pectoris: Secondary | ICD-10-CM | POA: Diagnosis present

## 2022-12-06 DIAGNOSIS — E669 Obesity, unspecified: Secondary | ICD-10-CM | POA: Diagnosis present

## 2022-12-06 DIAGNOSIS — Z794 Long term (current) use of insulin: Secondary | ICD-10-CM

## 2022-12-06 DIAGNOSIS — R1110 Vomiting, unspecified: Principal | ICD-10-CM

## 2022-12-06 DIAGNOSIS — Z833 Family history of diabetes mellitus: Secondary | ICD-10-CM

## 2022-12-06 DIAGNOSIS — Z6833 Body mass index (BMI) 33.0-33.9, adult: Secondary | ICD-10-CM | POA: Diagnosis not present

## 2022-12-06 DIAGNOSIS — K219 Gastro-esophageal reflux disease without esophagitis: Secondary | ICD-10-CM | POA: Diagnosis present

## 2022-12-06 DIAGNOSIS — D638 Anemia in other chronic diseases classified elsewhere: Secondary | ICD-10-CM | POA: Insufficient documentation

## 2022-12-06 DIAGNOSIS — I132 Hypertensive heart and chronic kidney disease with heart failure and with stage 5 chronic kidney disease, or end stage renal disease: Secondary | ICD-10-CM | POA: Diagnosis present

## 2022-12-06 DIAGNOSIS — R42 Dizziness and giddiness: Secondary | ICD-10-CM | POA: Diagnosis present

## 2022-12-06 DIAGNOSIS — R1115 Cyclical vomiting syndrome unrelated to migraine: Secondary | ICD-10-CM | POA: Diagnosis not present

## 2022-12-06 DIAGNOSIS — I10 Essential (primary) hypertension: Secondary | ICD-10-CM | POA: Diagnosis not present

## 2022-12-06 DIAGNOSIS — E66811 Obesity, class 1: Secondary | ICD-10-CM | POA: Diagnosis present

## 2022-12-06 DIAGNOSIS — G4730 Sleep apnea, unspecified: Secondary | ICD-10-CM | POA: Diagnosis present

## 2022-12-06 DIAGNOSIS — Z8673 Personal history of transient ischemic attack (TIA), and cerebral infarction without residual deficits: Secondary | ICD-10-CM

## 2022-12-06 DIAGNOSIS — Z79899 Other long term (current) drug therapy: Secondary | ICD-10-CM

## 2022-12-06 LAB — COMPREHENSIVE METABOLIC PANEL
ALT: 23 U/L (ref 0–44)
AST: 44 U/L — ABNORMAL HIGH (ref 15–41)
Albumin: 4 g/dL (ref 3.5–5.0)
Alkaline Phosphatase: 104 U/L (ref 38–126)
Anion gap: 17 — ABNORMAL HIGH (ref 5–15)
BUN: 31 mg/dL — ABNORMAL HIGH (ref 8–23)
CO2: 27 mmol/L (ref 22–32)
Calcium: 8.9 mg/dL (ref 8.9–10.3)
Chloride: 94 mmol/L — ABNORMAL LOW (ref 98–111)
Creatinine, Ser: 5 mg/dL — ABNORMAL HIGH (ref 0.61–1.24)
GFR, Estimated: 12 mL/min — ABNORMAL LOW (ref >60)
Glucose, Bld: 260 mg/dL — ABNORMAL HIGH (ref 70–99)
Potassium: 4.7 mmol/L (ref 3.5–5.1)
Sodium: 138 mmol/L (ref 135–145)
Total Bilirubin: 1.6 mg/dL — ABNORMAL HIGH (ref 0.3–1.2)
Total Protein: 8.2 g/dL — ABNORMAL HIGH (ref 6.5–8.1)

## 2022-12-06 LAB — CBC
HCT: 43.5 % (ref 39.0–52.0)
Hemoglobin: 13.8 g/dL (ref 13.0–17.0)
MCH: 26 pg (ref 26.0–34.0)
MCHC: 31.7 g/dL (ref 30.0–36.0)
MCV: 81.9 fL (ref 80.0–100.0)
Platelets: 278 10*3/uL (ref 150–400)
RBC: 5.31 MIL/uL (ref 4.22–5.81)
RDW: 15.1 % (ref 11.5–15.5)
WBC: 9.6 10*3/uL (ref 4.0–10.5)
nRBC: 0 % (ref 0.0–0.2)

## 2022-12-06 LAB — RESP PANEL BY RT-PCR (RSV, FLU A&B, COVID)  RVPGX2
Influenza A by PCR: NEGATIVE
Influenza B by PCR: NEGATIVE
Resp Syncytial Virus by PCR: NEGATIVE
SARS Coronavirus 2 by RT PCR: NEGATIVE

## 2022-12-06 LAB — CBG MONITORING, ED: Glucose-Capillary: 243 mg/dL — ABNORMAL HIGH (ref 70–99)

## 2022-12-06 LAB — BRAIN NATRIURETIC PEPTIDE: B Natriuretic Peptide: 64.8 pg/mL (ref 0.0–100.0)

## 2022-12-06 LAB — PHOSPHORUS: Phosphorus: 4.2 mg/dL (ref 2.5–4.6)

## 2022-12-06 LAB — MAGNESIUM: Magnesium: 2.1 mg/dL (ref 1.7–2.4)

## 2022-12-06 MED ORDER — SODIUM CHLORIDE 0.9 % IV BOLUS
500.0000 mL | Freq: Once | INTRAVENOUS | Status: AC
  Administered 2022-12-06: 500 mL via INTRAVENOUS

## 2022-12-06 MED ORDER — ONDANSETRON HCL 4 MG/2ML IJ SOLN
4.0000 mg | Freq: Once | INTRAMUSCULAR | Status: AC
  Administered 2022-12-06: 4 mg via INTRAVENOUS
  Filled 2022-12-06: qty 2

## 2022-12-06 MED ORDER — MECLIZINE HCL 25 MG PO TABS
25.0000 mg | ORAL_TABLET | Freq: Once | ORAL | Status: AC
  Administered 2022-12-06: 25 mg via ORAL
  Filled 2022-12-06: qty 1

## 2022-12-06 MED ORDER — SODIUM CHLORIDE 0.9 % IV SOLN
12.5000 mg | Freq: Four times a day (QID) | INTRAVENOUS | Status: DC | PRN
  Administered 2022-12-06: 12.5 mg via INTRAVENOUS
  Filled 2022-12-06: qty 12.5

## 2022-12-06 NOTE — ED Provider Notes (Signed)
[facility] Provider Note    Event Date/Time   First MD Initiated Contact with Patient 12/06/22 1312     (approximate)   History   Emesis   HPI  Bobby Sampson is a 62 y.o. male past medical history significant for ESRD on HD MWF, diabetes, who presents to the emergency department with vomiting.  States that he is concerned that he ate something that did not settle well with him.  Endorses multiple episodes of vomiting over the past 18 hours after eating pork chops and beans that he got from takeout.  Denies any diarrhea.  Denies any upper abdominal pain or lower abdominal pain.  Denies any dysuria but does states he still makes urine.  Couple of episodes of vomiting on Friday before dialysis, completed a dialysis and then followed up with his primary care provider.       Physical Exam   Triage Vital Signs: ED Triage Vitals  Enc Vitals Group     BP      Pulse      Resp      Temp      Temp src      SpO2      Weight      Height      Head Circumference      Peak Flow      Pain Score      Pain Loc      Pain Edu?      Excl. in [place]?     Most recent vital signs: Vitals:   12/06/22 1430 12/06/22 1500  BP: (!) 187/89 (!) 190/91  Pulse: 85 89  Resp: 14 17  Temp:    SpO2: 94% 96%    Physical Exam Constitutional:      Appearance: He is well-developed.  HENT:     Head: Atraumatic.  Eyes:     Extraocular Movements: Extraocular movements intact.     Conjunctiva/sclera: Conjunctivae normal.     Pupils: Pupils are equal, round, and reactive to light.  Cardiovascular:     Rate and Rhythm: Regular rhythm.  Pulmonary:     Effort: No respiratory distress.  Abdominal:     General: There is no distension.     Tenderness: There is no abdominal tenderness.  Musculoskeletal:        General: Normal range of motion.     Cervical back: Normal range of motion.  Skin:    General: Skin is warm.  Neurological:     Mental Status: He is alert. Mental status  is at baseline.  Psychiatric:        Mood and Affect: Mood normal.     IMPRESSION / MDM / [place] / ED COURSE  I reviewed the triage vital signs and the nursing notes.  Patient presents to the emergency department with vomiting.  No missed dialysis sessions.  Differential diagnosis including gastritis/PUD, intra-abdominal pathology including appendicitis/small bowel obstruction, electrolyte abnormality, viral illness included COVID/influenza   EKG  I, Nathaniel Man, the attending physician, personally viewed and interpreted this ECG.   Rate: Normal  Rhythm: Normal sinus  Axis: Normal  Intervals: Normal  ST&T Change: T waves inverted to lateral leads unchanged compared to prior  No tachycardic or bradycardic dysrhythmias while on cardiac telemetry.    LABS (all labs ordered are listed, but only abnormal results are displayed) Labs interpreted as -  No significant electrolyte abnormalities.  COVID and influenza testing are negative  Labs Reviewed  COMPREHENSIVE METABOLIC PANEL - Abnormal; Notable for the following components:      Result Value   Chloride 94 (*)    Glucose, Bld 260 (*)    BUN 31 (*)    Creatinine, Ser 5.00 (*)    Total Protein 8.2 (*)    AST 44 (*)    Total Bilirubin 1.6 (*)    GFR, Estimated 12 (*)    Anion gap 17 (*)    All other components within normal limits  RESP PANEL BY RT-PCR (RSV, FLU A&B, COVID)  [id number]  CBC  MAGNESIUM  PHOSPHORUS  BRAIN NATRIURETIC PEPTIDE    TREATMENT    IV Zofran  On reevaluation continued to have nausea and vomiting given a second dose of IV Zofran.  Nausea and vomiting worse with head movement.  No signs of acute otitis media.  Will obtain CT scan of the head to evaluate for possible central cause of vertigo.  No ongoing nystagmus at this time no recent viral illness.  Patient does still make urine so will not obtain a CTA at this time.  PROCEDURES:  Critical Care performed:  No  Procedures  Patient's presentation is most consistent with acute presentation with potential threat to life or bodily function.   MEDICATIONS ORDERED IN ED: Medications  ondansetron (ZOFRAN) injection 4 mg (4 mg Intravenous Given 12/06/22 1347)  ondansetron (ZOFRAN) injection 4 mg (4 mg Intravenous Given 12/06/22 1509)  meclizine (ANTIVERT) tablet 25 mg (25 mg Oral Given 12/06/22 1513)    FINAL CLINICAL IMPRESSION(S) / ED DIAGNOSES   Final diagnoses:  Vomiting without nausea, unspecified vomiting type     Rx / DC Orders   ED Discharge Orders     None        Note:  This document was prepared using Dragon voice recognition software and may include unintentional dictation errors.   Nathaniel Man, MD 12/06/22 1537

## 2022-12-06 NOTE — ED Provider Notes (Signed)
Procedures     ----------------------------------------- 6:46 PM on 12/06/2022 ----------------------------------------- CT head interpreted by me, negative for mass or intracranial hemorrhage.  Radiology report reviewed.   Still having intractable vomiting.  Still feels vertiginous.  No other focal neuro findings.  Will give a small dose of Phenergan, will repeat an EKG to ensure QTc intervals okay.  Case discussed with hospitalist for further management.    Carrie Mew, MD 12/06/22 [phone]

## 2022-12-06 NOTE — ED Triage Notes (Signed)
Pt BIBA from home. Pt had leftovers that may have gone bad last night. Pt has had N/V x 18 hours. Started as digested food product, now bile.  Pt is a M/W/F dialysis. He went yesterday without issue.  190/86 325 CBG  22 L hand- zofran given

## 2022-12-06 NOTE — ED Notes (Signed)
O2 in mid 80s. Pt placed on nasal cannula 2L. O2 94% at this time.

## 2022-12-06 NOTE — H&P (Signed)
History and Physical    Patient: Bobby Sampson YDX:[id number] DOB: [birth year] DOA: 12/06/2022 DOS: the patient was seen and examined on 12/06/2022 PCP: [facility] [place]  Patient coming from: Home  Chief Complaint:  Chief Complaint  Patient presents with   Emesis   HPI: Bobby Sampson is a 62 y.o. male with medical history significant of end-stage renal disease, diabetes, recurrent CVA, essential hypertension, diabetic neuropathy, who apparently has been having significant nausea vomiting for the last few days.  He also went to dialysis yesterday.  Patient has been having associated dizziness.  Every little movement of his head he feels the room is spinning around him.  Came to the ER where he was found to have some vertigo.  Patient is being admitted for further evaluation and treatment.  He is currently awake.  Patient felt he was dehydrated in the setting of multiple episodes of vomiting as well as hemodialysis.  Has received some fluid bolus in the ER.  With his history of stroke however suspicion for central vertigo is high.  Patient being admitted to Ortho for possible CVA.  Review of Systems: As mentioned in the history of present illness. All other systems reviewed and are negative. Past Medical History:  Diagnosis Date   Diabetes mellitus without complication ([place])    Hypertension    Neuropathy    Renal disorder    Stroke [facility])    2016   Past Surgical History:  Procedure Laterality Date   INTRAMEDULLARY (IM) NAIL INTERTROCHANTERIC Right 11/02/2021   Procedure: INTRAMEDULLARY (IM) NAIL INTERTROCHANTRIC;  Surgeon: Thornton Park, MD;  Location: [facility] ORS;  Service: Orthopedics;  Laterality: Right;   TONSILLECTOMY     Social History:  reports that he has never smoked. He has never used smokeless tobacco. He reports that he does not currently use alcohol. He reports that he does not currently use drugs.  Allergies  Allergen Reactions   Liraglutide Nausea And Vomiting     Other reaction(s): Nausea and vomiting, Fatigue   Trulicity [Dulaglutide] Nausea And Vomiting    Family History  Problem Relation Age of Onset   Hypertension Mother    Diabetes Mother    Kidney disease Mother     Prior to Admission medications   Medication Sig Start Date End Date Taking? Authorizing Provider  amLODipine (NORVASC) 2.5 MG tablet Take 2.5 mg by mouth daily. 01/21/22   [provider]  aspirin EC 81 MG tablet Take 1 tablet (81 mg total) by mouth daily. 11/06/21   Enzo Bi, MD  atorvastatin (LIPITOR) 80 MG tablet Take 80 mg by mouth daily.    [provider]  buPROPion (WELLBUTRIN XL) 150 MG 24 hr tablet Take 150 mg by mouth daily. 10/15/21   [provider]  calcium acetate (PHOSLO) 667 MG capsule Take 667 mg by mouth 3 (three) times daily. 11/26/21   [provider]  cholecalciferol (VITAMIN D) 25 MCG (1000 UT) tablet Take 2,000 Units by mouth daily.    [provider]  ciprofloxacin (CILOXAN) 0.3 % ophthalmic solution Place 1 drop into the right eye 4 (four) times daily. 01/23/22   [provider]  dorzolamide-timolol (COSOPT) 22.3-6.8 MG/ML ophthalmic solution Place 1 drop into the left eye 2 (two) times daily. 01/23/22   [provider]  FLUoxetine (PROZAC) 20 MG capsule Take 20 mg by mouth daily. 08/08/21   [provider]  insulin glargine (LANTUS SOLOSTAR) 100 UNIT/ML Solostar Pen Inject 20 Units into the skin every 12 (  twelve) hours. 02/16/21   Wieting, Richard, MD  latanoprost (XALATAN) 0.005 % ophthalmic solution Place 1 drop into both eyes at bedtime. 12/21/20   [provider]  loratadine (CLARITIN) 10 MG tablet Take 10 mg by mouth daily.    [provider]  melatonin 3 MG TABS tablet Take 6 mg by mouth at bedtime.    [provider]  metoprolol succinate (TOPROL-XL) 25 MG 24 hr tablet Take 2 tablets (50 mg total) by mouth daily. Patient taking differently: Take 25 mg by  mouth daily. 11/06/21   Enzo Bi, MD  midodrine (PROAMATINE) 2.5 MG tablet Take 2.5 mg by mouth as directed. 12/15/21   [provider]  multivitamin (RENA-VIT) TABS tablet Take 1 tablet by mouth at bedtime. 11/06/21   Enzo Bi, MD  NOVOLOG FLEXPEN 100 UNIT/ML FlexPen Inject 10 Units into the skin 3 (three) times daily with meals. 01/02/21   [provider]  ondansetron (ZOFRAN-ODT) 8 MG disintegrating tablet Take 8 mg by mouth every 8 (eight) hours as needed for nausea. 08/08/21   [provider]  oxyCODONE (OXY IR/ROXICODONE) 5 MG immediate release tablet Take 1 tablet (5 mg total) by mouth every 6 (six) hours as needed for severe pain. 01/27/22   Sharen Hones, MD  pantoprazole (PROTONIX) 40 MG tablet Take 1 tablet (40 mg total) by mouth daily. 02/16/21 11/02/21  Loletha Grayer, MD  polyethylene glycol (MIRALAX / GLYCOLAX) 17 g packet Take 17 g by mouth daily. 11/06/21   Enzo Bi, MD  prednisoLONE acetate (PRED FORTE) 1 % ophthalmic suspension Place into the right eye. 01/23/22   [provider]    Physical Exam: Vitals:   12/06/22 1500 12/06/22 1530 12/06/22 1603 12/06/22 1900  BP: (!) 190/91 (!) 168/101 (!) 169/89   Pulse: 89 89 94   Resp: 17 14 18    Temp:    97.7 F (36.5 C)  TempSrc:    Oral  SpO2: 96%  99%   Weight:      Height:       Constitutional: Chronic ill looking NAD, calm, comfortable Eyes: PERRL, lids and conjunctivae normal ENMT: Mucous membranes are moist. Posterior pharynx clear of any exudate or lesions.Normal dentition.  Neck: normal, supple, no masses, no thyromegaly Respiratory: clear to auscultation bilaterally, no wheezing, no crackles. Normal respiratory effort. No accessory muscle use.  Cardiovascular: Regular rate and rhythm, no murmurs / rubs / gallops. No extremity edema. 2+ pedal pulses. No carotid bruits.  Abdomen: no tenderness, no masses palpated. No hepatosplenomegaly. Bowel sounds positive.  Musculoskeletal: Good range of  motion, no joint swelling or tenderness, Skin: no rashes, lesions, ulcers. No induration Neurologic: CN 2-12 grossly intact. Sensation intact, DTR normal. Strength 5/5 in all 4.  Dizziness with nystagmus horizontally Psychiatric: Normal judgment and insight. Alert and oriented x 3. Normal mood  Data Reviewed:  Glucose 260, BUN 13, creatinine 5.0, anion gap 17.  Acute viral screen is negative.  Head CT without contrast is negative.  Blood pressure is 190/91.  Assessment and Plan:  #1 vertigo: Peripheral versus central.  Patient has prior history of CVA.  We will therefore admit the patient to working for possible CVA and central vertigo.  Patient has received IV fluids as well as meclizine.  Will check MRI of the brain, carotid Dopplers, echocardiogram.  #2 essential hypertension: Confirm on resume home regimen.  Continue monitor.  #3 insulin-dependent diabetes: Initiate sliding scale insulin.  #4 end-stage renal disease: On hemodialysis.  Continue per  nephrology  #5 GERD: Continue PPIs.  #6 morbid obesity: Better counseling  #7 obstructive sleep apnea: CPAP at night.    Advance Care Planning:   Code Status: Full Code full code  Consults: Teleneurology consult  Family Communication: Wife at bedside  Severity of Illness: The appropriate patient status for this patient is OBSERVATION. Observation status is judged to be reasonable and necessary in order to provide the required intensity of service to ensure the patient's safety. The patient's presenting symptoms, physical exam findings, and initial radiographic and laboratory data in the context of their medical condition is felt to place them at decreased risk for further clinical deterioration. Furthermore, it is anticipated that the patient will be medically stable for discharge from the hospital within 2 midnights of admission.   AuthorBarbette Merino, MD 12/06/2022 7:01 PM  For on call review [email or web address].

## 2022-12-06 NOTE — ED Notes (Signed)
Pt initially refused zofran again, started having active emesis and given zofran at that time.

## 2022-12-07 ENCOUNTER — Inpatient Hospital Stay: Payer: 59

## 2022-12-07 ENCOUNTER — Inpatient Hospital Stay
Admit: 2022-12-07 | Discharge: 2022-12-07 | Disposition: A | Discharge status: Home / Self Care | Payer: 59 | Attending: Internal Medicine | Admitting: Internal Medicine

## 2022-12-07 DIAGNOSIS — Z992 Dependence on renal dialysis: Secondary | ICD-10-CM

## 2022-12-07 DIAGNOSIS — I10 Essential (primary) hypertension: Secondary | ICD-10-CM | POA: Diagnosis not present

## 2022-12-07 DIAGNOSIS — G4733 Obstructive sleep apnea (adult) (pediatric): Secondary | ICD-10-CM

## 2022-12-07 DIAGNOSIS — R1115 Cyclical vomiting syndrome unrelated to migraine: Secondary | ICD-10-CM

## 2022-12-07 DIAGNOSIS — I5032 Chronic diastolic (congestive) heart failure: Secondary | ICD-10-CM

## 2022-12-07 DIAGNOSIS — N186 End stage renal disease: Secondary | ICD-10-CM | POA: Diagnosis not present

## 2022-12-07 DIAGNOSIS — D631 Anemia in chronic kidney disease: Secondary | ICD-10-CM

## 2022-12-07 DIAGNOSIS — E669 Obesity, unspecified: Secondary | ICD-10-CM

## 2022-12-07 DIAGNOSIS — Z794 Long term (current) use of insulin: Secondary | ICD-10-CM

## 2022-12-07 DIAGNOSIS — E1165 Type 2 diabetes mellitus with hyperglycemia: Secondary | ICD-10-CM | POA: Diagnosis not present

## 2022-12-07 LAB — CBC
HCT: 42.6 % (ref 39.0–52.0)
Hemoglobin: 13.4 g/dL (ref 13.0–17.0)
MCH: 26.3 pg (ref 26.0–34.0)
MCHC: 31.5 g/dL (ref 30.0–36.0)
MCV: 83.5 fL (ref 80.0–100.0)
Platelets: 245 10*3/uL (ref 150–400)
RBC: 5.1 MIL/uL (ref 4.22–5.81)
RDW: 15.2 % (ref 11.5–15.5)
WBC: 10.7 10*3/uL — ABNORMAL HIGH (ref 4.0–10.5)
nRBC: 0 % (ref 0.0–0.2)

## 2022-12-07 LAB — CBG MONITORING, ED
Glucose-Capillary: 188 mg/dL — ABNORMAL HIGH (ref 70–99)
Glucose-Capillary: 206 mg/dL — ABNORMAL HIGH (ref 70–99)
Glucose-Capillary: 213 mg/dL — ABNORMAL HIGH (ref 70–99)
Glucose-Capillary: 157 mg/dL — ABNORMAL HIGH (ref 70–99)

## 2022-12-07 LAB — HIV ANTIBODY (ROUTINE TESTING W REFLEX): HIV Screen 4th Generation wRfx: NONREACTIVE

## 2022-12-07 LAB — ECHOCARDIOGRAM COMPLETE
AR max vel: 2.34 cm2
AV Peak grad: 6.3 mmHg
Ao pk vel: 1.25 m/s
Area-P 1/2: 5.27 cm2
Calc EF: 68 %
Height: 69 in
S' Lateral: 2.97 cm
Single Plane A2C EF: 71.7 %
Single Plane A4C EF: 62.5 %
Weight: 3601.43 oz

## 2022-12-07 LAB — LIPID PANEL
Cholesterol: 131 mg/dL (ref 0–200)
HDL: 42 mg/dL (ref >40)
LDL Cholesterol: 68 mg/dL (ref 0–99)
Total CHOL/HDL Ratio: 3.1 RATIO
Triglycerides: 106 mg/dL (ref <150)
VLDL: 21 mg/dL (ref 0–40)

## 2022-12-07 LAB — CREATININE, SERUM
Creatinine, Ser: 6.15 mg/dL — ABNORMAL HIGH (ref 0.61–1.24)
GFR, Estimated: 10 mL/min — ABNORMAL LOW (ref >60)

## 2022-12-07 MED ORDER — ACETAMINOPHEN 325 MG RE SUPP: 650.0000 mg | RECTAL | Status: DC | PRN

## 2022-12-07 MED ORDER — INSULIN ASPART 100 UNIT/ML IJ SOLN: 0.0000 [IU] | Freq: Every day | INTRAMUSCULAR | Status: DC

## 2022-12-07 MED ORDER — LATANOPROST 0.005 % OP SOLN: 1.0000 [drp] | Freq: Every day | OPHTHALMIC | Status: DC

## 2022-12-07 MED ORDER — HEPARIN SODIUM (PORCINE) 5000 UNIT/ML IJ SOLN
5000.0000 [IU] | Freq: Three times a day (TID) | INTRAMUSCULAR | Status: DC
  Administered 2022-12-07 – 2022-12-08 (×4): 5000 [IU] via SUBCUTANEOUS
  Filled 2022-12-07 (×6): qty 1

## 2022-12-07 MED ORDER — MECLIZINE HCL 25 MG PO TABS: 12.5000 mg | ORAL_TABLET | Freq: Three times a day (TID) | ORAL | Status: DC | PRN

## 2022-12-07 MED ORDER — CALCIUM ACETATE (PHOS BINDER) 667 MG PO CAPS
667.0000 mg | ORAL_CAPSULE | Freq: Three times a day (TID) | ORAL | Status: DC
  Filled 2022-12-07 (×3): qty 1

## 2022-12-07 MED ORDER — HEPARIN SODIUM (PORCINE) 1000 UNIT/ML DIALYSIS: 1000.0000 [IU] | INTRAMUSCULAR | Status: DC | PRN

## 2022-12-07 MED ORDER — PANTOPRAZOLE SODIUM 40 MG IV SOLR
40.0000 mg | Freq: Two times a day (BID) | INTRAVENOUS | Status: DC
  Administered 2022-12-07 (×2): 40 mg via INTRAVENOUS
  Filled 2022-12-07 (×2): qty 10

## 2022-12-07 MED ORDER — PERFLUTREN LIPID MICROSPHERE
1.0000 mL | INTRAVENOUS | Status: AC | PRN
  Administered 2022-12-07: 3.5 mL via INTRAVENOUS

## 2022-12-07 MED ORDER — LIDOCAINE-PRILOCAINE 2.5-2.5 % EX CREA: 1.0000 | TOPICAL_CREAM | CUTANEOUS | Status: DC | PRN

## 2022-12-07 MED ORDER — METOCLOPRAMIDE HCL 5 MG/ML IJ SOLN
5.0000 mg | Freq: Three times a day (TID) | INTRAMUSCULAR | Status: DC
  Administered 2022-12-07 (×2): 5 mg via INTRAVENOUS
  Filled 2022-12-07 (×2): qty 2

## 2022-12-07 MED ORDER — SENNOSIDES-DOCUSATE SODIUM 8.6-50 MG PO TABS: 1.0000 | ORAL_TABLET | Freq: Every evening | ORAL | Status: DC | PRN

## 2022-12-07 MED ORDER — LIDOCAINE HCL (PF) 1 % IJ SOLN: 5.0000 mL | INTRAMUSCULAR | Status: DC | PRN

## 2022-12-07 MED ORDER — RENA-VITE PO TABS: 1.0000 | ORAL_TABLET | Freq: Every day | ORAL | Status: DC

## 2022-12-07 MED ORDER — ASPIRIN 81 MG PO TBEC: 81.0000 mg | DELAYED_RELEASE_TABLET | Freq: Every day | ORAL | Status: DC

## 2022-12-07 MED ORDER — INSULIN ASPART 100 UNIT/ML IJ SOLN
0.0000 [IU] | Freq: Three times a day (TID) | INTRAMUSCULAR | Status: DC
  Administered 2022-12-07: 2 [IU] via SUBCUTANEOUS
  Administered 2022-12-07 (×2): 3 [IU] via SUBCUTANEOUS
  Filled 2022-12-07 (×3): qty 1

## 2022-12-07 MED ORDER — ACETAMINOPHEN 325 MG PO TABS: 650.0000 mg | ORAL_TABLET | ORAL | Status: DC | PRN

## 2022-12-07 MED ORDER — ACETAMINOPHEN 160 MG/5ML PO SOLN: 650.0000 mg | ORAL | Status: DC | PRN

## 2022-12-07 MED ORDER — MELATONIN 5 MG PO TABS
5.0000 mg | ORAL_TABLET | Freq: Every day | ORAL | Status: DC
  Administered 2022-12-07: 5 mg via ORAL
  Filled 2022-12-07: qty 1

## 2022-12-07 MED ORDER — BUPROPION HCL ER (XL) 150 MG PO TB24: 150.0000 mg | ORAL_TABLET | Freq: Every day | ORAL | Status: DC

## 2022-12-07 MED ORDER — ANTICOAGULANT SODIUM CITRATE 4% (200MG/5ML) IV SOLN: 5.0000 mL | Status: DC | PRN

## 2022-12-07 MED ORDER — SODIUM CHLORIDE 0.9 % IV BOLUS
500.0000 mL | Freq: Once | INTRAVENOUS | Status: AC
  Administered 2022-12-07: 500 mL via INTRAVENOUS

## 2022-12-07 MED ORDER — SODIUM CHLORIDE 0.9 % IV SOLN: INTRAVENOUS | Status: DC

## 2022-12-07 MED ORDER — ALTEPLASE 2 MG IJ SOLR: 2.0000 mg | Freq: Once | INTRAMUSCULAR | Status: DC | PRN

## 2022-12-07 MED ORDER — STROKE: EARLY STAGES OF RECOVERY BOOK: Freq: Once | Status: DC

## 2022-12-07 MED ORDER — MIDODRINE HCL 5 MG PO TABS: 2.5000 mg | ORAL_TABLET | ORAL | Status: DC

## 2022-12-07 MED ORDER — FLUOXETINE HCL 20 MG PO CAPS: 20.0000 mg | ORAL_CAPSULE | Freq: Every day | ORAL | Status: DC

## 2022-12-07 MED ORDER — CHLORHEXIDINE GLUCONATE CLOTH 2 % EX PADS
6.0000 | MEDICATED_PAD | Freq: Every day | CUTANEOUS | Status: DC
  Filled 2022-12-07: qty 6

## 2022-12-07 MED ORDER — PENTAFLUOROPROP-TETRAFLUOROETH EX AERO: 1.0000 | INHALATION_SPRAY | CUTANEOUS | Status: DC | PRN

## 2022-12-07 NOTE — Hospital Course (Signed)
62 year old man with past medical history of end-stage renal disease on dialysis, diabetes, recurrent CVA, essential hypertension, diabetic neuropathy.  Patient having persistent nausea vomiting and dizziness over the last few days.  In the ER, MRI of the brain was negative.  Patient was actually feeling better and wanted to go home but then started vomiting again.  1/8.  Patient had diarrhea overnight.  Stool studies were negative.  Patient did not eat breakfast this morning but did eat some toast.  No further nausea or vomiting.  Called this evening to see the patient because he felt better and wanted to go home.  He tolerated dinner.

## 2022-12-07 NOTE — ED Notes (Signed)
Pt is refusing carotid artery u/s. Pt stating this has nothing to do with original complaint. Informed hospitalist. Pt saying wants to go home. Pt now saying ok to u/s but also stating "I've been coming to the hospital for 4 years. I know my body".

## 2022-12-07 NOTE — Assessment & Plan Note (Signed)
Restart Toprol and Norvasc.

## 2022-12-07 NOTE — ED Notes (Signed)
Pt continues to sleep. Will give overdue heparin after allowing pt to rest some.

## 2022-12-07 NOTE — ED Notes (Signed)
PT saw pt at bedside. Pt assisted to walk to bathroom and had BM. Pt has started vomiting again, 157mL clear emesis in bag. Hospitalist informed.

## 2022-12-07 NOTE — ED Notes (Signed)
Informed provider pt having diarrhea. Pt upset because no one could make it in time to unhook him to go to bathroom. Pt upset and stating he "just wants to go home". Provided wet wipes. Apologized to pt. Informed provider.

## 2022-12-07 NOTE — Progress Notes (Signed)
Referring Provider: No ref. provider found Primary Care Physician:  [facility] Primary Nephrologist:  Dr.   Luiz Iron for Consultation: ESRD  HPI: 62 year old male with history of diabetes, peripheral vascular disease, hypertension, coronary artery disease, history of CVA and end-stage renal disease on dialysis on a Monday Wednesday Friday schedule now admitted with history of intractable vomiting.  Patient states the symptoms are much worse after dialysis treatments.  As per the family patient feels very weak and tired at the end of dialysis.  His blood pressures have always been very low after treatments.  He denies any chest pain, fever or headaches.  Past Medical History:  Diagnosis Date   Diabetes mellitus without complication ([place])    Hypertension    Neuropathy    Renal disorder    Stroke [facility])    2016    Past Surgical History:  Procedure Laterality Date   INTRAMEDULLARY (IM) NAIL INTERTROCHANTERIC Right 11/02/2021   Procedure: INTRAMEDULLARY (IM) NAIL INTERTROCHANTRIC;  Surgeon: Thornton Park, MD;  Location: [facility] ORS;  Service: Orthopedics;  Laterality: Right;   TONSILLECTOMY      Prior to Admission medications   Medication Sig Start Date End Date Taking? Authorizing Provider  amLODipine (NORVASC) 2.5 MG tablet Take 2.5 mg by mouth daily. 01/21/22  Yes [provider]  aspirin EC 81 MG tablet Take 1 tablet (81 mg total) by mouth daily. 11/06/21  Yes Enzo Bi, MD  atorvastatin (LIPITOR) 80 MG tablet Take 80 mg by mouth daily.   Yes [provider]  buPROPion (WELLBUTRIN XL) 150 MG 24 hr tablet Take 150 mg by mouth daily. 10/15/21  Yes [provider]  calcium acetate (PHOSLO) 667 MG capsule Take 667 mg by mouth 3 (three) times daily. 11/26/21  Yes [provider]  FLUoxetine (PROZAC) 20 MG capsule Take 20 mg by mouth daily. 08/08/21  Yes [provider]  insulin glargine (LANTUS SOLOSTAR) 100 UNIT/ML Solostar Pen  Inject 20 Units into the skin every 12 (twelve) hours. Patient taking differently: Inject 16 Units into the skin every 12 (twelve) hours. 02/16/21  Yes Wieting, Richard, MD  latanoprost (XALATAN) 0.005 % ophthalmic solution Place 1 drop into both eyes at bedtime. 12/21/20  Yes [provider]  melatonin 3 MG TABS tablet Take 6 mg by mouth at bedtime.   Yes [provider]  metoprolol succinate (TOPROL-XL) 25 MG 24 hr tablet Take 2 tablets (50 mg total) by mouth daily. Patient taking differently: Take 25 mg by mouth daily. 11/06/21  Yes Enzo Bi, MD  midodrine (PROAMATINE) 2.5 MG tablet Take 2.5 mg by mouth as directed. On Dialysis days Monday, Wednesday and Friday 12/15/21  Yes [provider]  NOVOLOG FLEXPEN 100 UNIT/ML FlexPen Inject 10-16 Units into the skin 3 (three) times daily with meals. 01/02/21  Yes [provider]  pantoprazole (PROTONIX) 40 MG tablet Take 1 tablet (40 mg total) by mouth daily. 02/16/21 12/06/22 Yes Wieting, Richard, MD  multivitamin (RENA-VIT) TABS tablet Take 1 tablet by mouth at bedtime. 11/06/21   Enzo Bi, MD    Current Facility-Administered Medications  Medication Dose Route Frequency Provider Last Rate Last Admin    stroke: early stages of recovery book   Does not apply Once Garba, Henderson Newcomer, MD       0.9 %  sodium chloride infusion   Intravenous Continuous Wieting, Richard, MD       acetaminophen (TYLENOL) tablet 650 mg  650 mg Oral Q4H PRN Garba, Mohammad L,  MD       Or   acetaminophen (TYLENOL) 160 MG/5ML solution 650 mg  650 mg Per Tube Q4H PRN Elwyn Reach, MD       Or   acetaminophen (TYLENOL) suppository 650 mg  650 mg Rectal Q4H PRN Elwyn Reach, MD       [START ON 12/08/2022] aspirin EC tablet 81 mg  81 mg Oral Daily Loletha Grayer, MD       buPROPion (WELLBUTRIN XL) 24 hr tablet 150 mg  150 mg Oral Daily Loletha Grayer, MD       Derrill Memo ON 12/08/2022] calcium acetate (PHOSLO) capsule 667 mg  667 mg Oral TID WC  Loletha Grayer, MD       FLUoxetine (PROZAC) capsule 20 mg  20 mg Oral Daily Wieting, Richard, MD       heparin injection 5,000 Units  5,000 Units Subcutaneous Q8H Elwyn Reach, MD   5,000 Units at 12/07/22 0814   insulin aspart (novoLOG) injection 0-5 Units  0-5 Units Subcutaneous QHS Garba, Mohammad L, MD       insulin aspart (novoLOG) injection 0-9 Units  0-9 Units Subcutaneous TID WC Elwyn Reach, MD   2 Units at 12/07/22 0826   [START ON 12/08/2022] latanoprost (XALATAN) 0.005 % ophthalmic solution 1 drop  1 drop Both Eyes QHS Wieting, Richard, MD       meclizine (ANTIVERT) tablet 12.5 mg  12.5 mg Oral TID PRN Loletha Grayer, MD       melatonin tablet 5 mg  5 mg Oral QHS Loletha Grayer, MD       Derrill Memo ON 12/08/2022] midodrine (PROAMATINE) tablet 2.5 mg  2.5 mg Oral Q M,W,F-HD Loletha Grayer, MD       Derrill Memo ON 12/08/2022] multivitamin (RENA-VIT) tablet 1 tablet  1 tablet Oral QHS Wieting, Richard, MD       pantoprazole (PROTONIX) injection 40 mg  40 mg Intravenous Q12H Wieting, Richard, MD       promethazine (PHENERGAN) 12.5 mg in sodium chloride 0.9 % 50 mL IVPB  12.5 mg Intravenous Q6H PRN Carrie Mew, MD   Stopped at 12/06/22 2227   senna-docusate (Senokot-S) tablet 1 tablet  1 tablet Oral QHS PRN Elwyn Reach, MD       sodium chloride 0.9 % bolus 500 mL  500 mL Intravenous Once Loletha Grayer, MD       Current Outpatient Medications  Medication Sig Dispense Refill   amLODipine (NORVASC) 2.5 MG tablet Take 2.5 mg by mouth daily.     aspirin EC 81 MG tablet Take 1 tablet (81 mg total) by mouth daily.     atorvastatin (LIPITOR) 80 MG tablet Take 80 mg by mouth daily.     buPROPion (WELLBUTRIN XL) 150 MG 24 hr tablet Take 150 mg by mouth daily.     calcium acetate (PHOSLO) 667 MG capsule Take 667 mg by mouth 3 (three) times daily.     FLUoxetine (PROZAC) 20 MG capsule Take 20 mg by mouth daily.     insulin glargine (LANTUS SOLOSTAR) 100 UNIT/ML Solostar Pen  Inject 20 Units into the skin every 12 (twelve) hours. (Patient taking differently: Inject 16 Units into the skin every 12 (twelve) hours.) 15 mL 11   latanoprost (XALATAN) 0.005 % ophthalmic solution Place 1 drop into both eyes at bedtime.     melatonin 3 MG TABS tablet Take 6 mg by mouth at bedtime.     metoprolol succinate (TOPROL-XL) 25 MG 24  hr tablet Take 2 tablets (50 mg total) by mouth daily. (Patient taking differently: Take 25 mg by mouth daily.)     midodrine (PROAMATINE) 2.5 MG tablet Take 2.5 mg by mouth as directed. On Dialysis days Monday, Wednesday and Friday     NOVOLOG FLEXPEN 100 UNIT/ML FlexPen Inject 10-16 Units into the skin 3 (three) times daily with meals.     pantoprazole (PROTONIX) 40 MG tablet Take 1 tablet (40 mg total) by mouth daily. 30 tablet 0   multivitamin (RENA-VIT) TABS tablet Take 1 tablet by mouth at bedtime.  0    Allergies as of 12/06/2022 - Review Complete 12/06/2022  Allergen Reaction Noted   Liraglutide Nausea And Vomiting 59/56/3875   Trulicity [dulaglutide] Nausea And Vomiting 11/07/2019    Family History  Problem Relation Age of Onset   Hypertension Mother    Diabetes Mother    Kidney disease Mother     Social History   Socioeconomic History   Marital status: Married    Spouse name: Not on file   Number of children: Not on file   Years of education: Not on file   Highest education level: Not on file  Occupational History   Not on file  Tobacco Use   Smoking status: Never   Smokeless tobacco: Never  Substance and Sexual Activity   Alcohol use: Not Currently   Drug use: Not Currently   Sexual activity: Not on file  Other Topics Concern   Not on file  Social History Narrative   Not on file   Social Determinants of Health   Financial Resource Strain: Not on file  Food Insecurity: Not on file  Transportation Needs: Not on file  Physical Activity: Not on file  Stress: Not on file  Social Connections: Not on file  Intimate  Partner Violence: Not on file    Physical Exam: Vital signs in last 24 hours: Temp:  [97.7 F (36.5 C)-98.1 F (36.7 C)] 97.7 F (36.5 C) (01/07 0627) Pulse Rate:  [82-95] 91 (01/07 1000) Resp:  [11-19] 19 (01/07 1000) BP: (139-190)/(68-105) 179/105 (01/07 0612) SpO2:  [92 %-100 %] 94 % (01/07 1000) Weight:  [102.1 kg] 102.1 kg (01/06 1315)   General:   Alert,  Well-developed, well-nourished, pleasant and cooperative in NAD Head:  Normocephalic and atraumatic. Eyes:  Sclera clear, no icterus.   Conjunctiva pink. Ears:  Normal auditory acuity. Nose:  No deformity, discharge,  or lesions. Lungs:  Clear throughout to auscultation.   No wheezes, crackles, or rhonchi. No acute distress. Heart:  Regular rate and rhythm; no murmurs, clicks, rubs,  or gallops. Abdomen:  Soft, nontender and nondistended. No masses, hepatosplenomegaly or hernias noted. Normal bowel sounds, without guarding, and without rebound.   Extremities:  Without clubbing or edema.  Intake/Output from previous day: No intake/output data recorded. Intake/Output this shift: Total I/O In: -  Out: 100 [Emesis/NG output:100]  Lab Results: Recent Labs    12/06/22 1332 12/07/22 0624  WBC 9.6 10.7*  HGB 13.8 13.4  HCT 43.5 42.6  PLT 278 245   BMET Recent Labs    12/06/22 1332 12/07/22 0624  NA 138  --   K 4.7  --   CL 94*  --   CO2 27  --   GLUCOSE 260*  --   BUN 31*  --   CREATININE 5.00* 6.15*  CALCIUM 8.9  --   PHOS 4.2  --    LFT Recent Labs    12/06/22 1332  PROT 8.2*  ALBUMIN 4.0  AST 44*  ALT 23  ALKPHOS 104  BILITOT 1.6*   PT/INR No results for input(s): "LABPROT", "INR" in the last 72 hours. Hepatitis Panel No results for input(s): "HEPBSAG", "HCVAB", "HEPAIGM", "HEPBIGM" in the last 72 hours.  Studies/Results: US Carotid Bilateral (at [facility] and [facility] only)  Result Date: 12/07/2022 CLINICAL DATA:  CVA EXAM: BILATERAL CAROTID DUPLEX ULTRASOUND TECHNIQUE: Pearline Cables scale imaging, color  Doppler and duplex ultrasound were performed of bilateral carotid and vertebral arteries in the neck. COMPARISON:  None Available. FINDINGS: Criteria: Quantification of carotid stenosis is based on velocity parameters that correlate the residual internal carotid diameter with NASCET-based stenosis levels, using the diameter of the distal internal carotid lumen as the denominator for stenosis measurement. The following velocity measurements were obtained: RIGHT ICA: 63/18 cm/sec CCA: 61/44 cm/sec SYSTOLIC ICA/CCA RATIO: 0.7 ECA: 115 cm/sec LEFT ICA: 77/27 cm/sec CCA: 315/40 cm/sec SYSTOLIC ICA/CCA RATIO: 0.6 ECA: 100 cm/sec RIGHT CAROTID ARTERY: Mild atheromatous plaque at the bifurcation. RIGHT VERTEBRAL ARTERY:  Antegrade with normal waveform LEFT CAROTID ARTERY:  Mild atheromatous plaque at the bifurcation LEFT VERTEBRAL ARTERY:  Antegrade with normal waveform IMPRESSION: 1. Mild atheromatous plaque of the cervical carotids with no evidence of significant stenosis. 2. Normal vertebral artery waveforms. Electronically Signed   By: Jorje Guild M.D.   On: 12/07/2022 10:03   MR BRAIN WO CONTRAST  Result Date: 12/07/2022 CLINICAL DATA:  Neuro deficit with acute stroke suspected EXAM: MRI HEAD WITHOUT CONTRAST TECHNIQUE: Multiplanar, multiecho pulse sequences of the brain and surrounding structures were obtained without intravenous contrast. COMPARISON:  Head CT from yesterday FINDINGS: Brain: No acute infarction, hemorrhage, hydrocephalus, extra-axial collection or mass lesion. Volume loss and lacunar/gliotic changes in the brainstem attributed to chronic infarcts with chronic blood products and emanating wallerian changes. Findings more likely post ischemic than from neuro degenerative disorder given prior imaging. Chronic lacune also seen at the lower left thalamus. Milder chronic small vessel ischemia in the cerebral white matter. Distinct remote white matter insult with wallerian changes crossing the genu of  the corpus callosum. Vascular: Major flow voids are preserved Skull and upper cervical spine: No focal marrow lesion Sinuses/Orbits: Bilateral cataract resection IMPRESSION: 1. No acute finding. 2. 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 Electronically Signed   By: Jorje Guild M.D.   On: 12/07/2022 06:08   CT Head Wo Contrast  Result Date: 12/06/2022 CLINICAL DATA:  Stroke suspected, headache. EXAM: CT HEAD WITHOUT CONTRAST TECHNIQUE: Contiguous axial images were obtained from the base of the skull through the vertex without intravenous contrast. RADIATION DOSE REDUCTION: This exam was performed according to the departmental dose-optimization program which includes automated exposure control, adjustment of the mA and/or kV according to patient size and/or use of iterative reconstruction technique. COMPARISON:  CT head 02/15/2021 FINDINGS: Brain: No evidence of acute infarction, hemorrhage, hydrocephalus, extra-axial collection or mass lesion/mass effect. Vascular: Atherosclerotic calcifications are present within the cavernous internal carotid arteries. Skull: Normal. Negative for fracture or focal lesion. Sinuses/Orbits: No acute finding. Other: Right occipital scalp scarring is again noted. IMPRESSION: No acute intracranial process. Electronically Signed   By: Ronney Asters M.D.   On: 12/06/2022 16:58    Assessment/Plan:  62 year old male with history of diabetes, peripheral vascular disease, hypertension, coronary artery disease, history of CVA and end-stage renal disease on dialysis on a Monday Wednesday Friday schedule now admitted with history of intractable vomiting.  Patient states the symptoms are much worse  after dialysis treatments.  As per the family patient feels very weak and tired at the end of dialysis.  His blood pressures have always been very low after treatments.  He denies any chest pain, fever or headaches.  ESRD: Patient is on a Monday Wednesday Friday  schedule.  Will place orders for dialysis in a.m.  Will attempt not to remove any fluid and adjust the EDW.  ANEMIA: Will check CBC and continue the anemia protocols.  MBD: We will check PTH, calcium and phosphorus levels.  Will continue calcium acetate.  HTN/VOL: Continue present antihypertensive medications.  I advised the patient to hold antihypertensives prior to dialysis treatment.  ACCESS: Will evaluate in AM.  Nausea/vomiting: This may be secondary to diabetic gastroparesis versus hypotension following dialysis treatments.  Will need to readjust the dry weight.  If patient does not improve with that he may need to be started on metoclopramide at 5 mg 2 times a day prior to meals.  Spoke to the entire family at bedside in detail and answered all their questions to their satisfaction.    LOS: [place], MD [facility] @TODAY @12 :35 PM

## 2022-12-07 NOTE — Evaluation (Signed)
Occupational Therapy Evaluation Patient Details Name: Bobby Sampson MRN: [id number] DOB: [birth year] Today's Date: 12/07/2022   History of Present Illness Pt is a 62 year old man presenting to the ED with n/v and dizziness since Friday; PMH significant for end-stage renal disease on dialysis, diabetes, recurrent CVA, essential hypertension, diabetic neuropathy   Clinical Impression   Chart reviewed, pt greeted in bed agreeable to OT evaluation. Pt is alert and oriented x4. PTA pt reports amb with no AD, intermittent use of RW and reports he has weakness from previous strokes. Pt has assist for ADL/IADL as needed. Pt reports he feels he is at/close to functional baseline besides not feeling well. Bed mobility completed with supervision, amb in room with CGA with RW due to generalized weakness, grooming/feeding tasks with SET UP. OT will follow while admitted to decrease regression of weakness while hospitalized, no OT needs identified following discharge. OT will follow acutely.    Recommendations for follow up therapy are one component of a multi-disciplinary discharge planning process, led by the attending physician.  Recommendations may be updated based on patient status, additional functional criteria and insurance authorization.   Follow Up Recommendations  No OT follow up     Assistance Recommended at Discharge Intermittent Supervision/Assistance  Patient can return home with the following A little help with walking and/or transfers;A little help with bathing/dressing/bathroom;Assistance with cooking/housework;Direct supervision/assist for medications management    Functional Status Assessment  Patient has had a recent decline in their functional status and demonstrates the ability to make significant improvements in function in a reasonable and predictable amount of time.  Equipment Recommendations  None recommended by OT;Other (comment) (pt has recommended equipment)    Recommendations  for Other Services       Precautions / Restrictions Precautions Precautions: Fall Restrictions Weight Bearing Restrictions: No      Mobility Bed Mobility Overal bed mobility: Needs Assistance Bed Mobility: Supine to Sit, Sit to Supine     Supine to sit: Supervision Sit to supine: Supervision        Transfers Overall transfer level: Needs assistance Equipment used: Rolling walker (2 wheels) Transfers: Sit to/from Stand, Bed to chair/wheelchair/BSC Sit to Stand: Min guard                  Balance Overall balance assessment: Needs assistance Sitting-balance support: Feet supported Sitting balance-Leahy Scale: Good     Standing balance support: Bilateral upper extremity supported, During functional activity, Reliant on assistive device for balance Standing balance-Leahy Scale: Fair                             ADL either performed or assessed with clinical judgement   ADL Overall ADL's : Needs assistance/impaired Eating/Feeding: Set up;Sitting   Grooming: Wash/dry hands;Sitting;Set up               Lower Body Dressing: Moderate assistance   Toilet Transfer: Min guard;Rolling walker (2 wheels) Toilet Transfer Details (indicate cue type and reason): simulated         Functional mobility during ADLs: Min guard;Rolling walker (2 wheels)       Vision Patient Visual Report: No change from baseline       Perception     Praxis      Pertinent Vitals/Pain Pain Assessment Pain Assessment: No/denies pain     Hand Dominance     Extremity/Trunk Assessment Upper Extremity Assessment Upper Extremity Assessment: Overall WFL for tasks  assessed   Lower Extremity Assessment Lower Extremity Assessment: Generalized weakness       Communication Communication Communication: No difficulties   Cognition Arousal/Alertness: Awake/alert Behavior During Therapy: WFL for tasks assessed/performed Overall Cognitive Status: Within Functional  Limits for tasks assessed                                       General Comments  vital signs monitored, appear stable throughout    Exercises Other Exercises Other Exercises: edu re: role of OT, role of rehab, discharge recommendations   Shoulder Instructions      Home Living Family/patient expects to be discharged to:: Private residence Living Arrangements: Children;Spouse/significant other Available Help at Discharge: Family;Available 24 hours/day Type of Home: House Home Access: Ramped entrance     Home Layout: One level     Bathroom Shower/Tub: Teacher, early years/pre: Standard Bathroom Accessibility: Yes   Home Equipment: Cane - quad;Cane - single point;Rollator (4 wheels);Tub bench;BSC/3in1;Rolling Walker (2 wheels);Wheelchair - manual          Prior Functioning/Environment Prior Level of Function : Independent/Modified Independent             Mobility Comments: pt report amb with no AD, intermittent use of RW as needed; ADLs Comments: generally MOD I with ADLS, assist for IADLs (transport to dialysis, wife assists with meds)        OT Problem List: Decreased strength;Decreased activity tolerance      OT Treatment/Interventions: Self-care/ADL training;Patient/family education;Therapeutic exercise;Energy conservation;Therapeutic activities    OT Goals(Current goals can be found in the care plan section) Acute Rehab OT Goals Patient Stated Goal: go home OT Goal Formulation: With patient Time For Goal Achievement: 12/21/22 Potential to Achieve Goals: Good ADL Goals Pt Will Perform Grooming: with modified independence Pt Will Transfer to Toilet: with modified independence Pt Will Perform Toileting - Clothing Manipulation and hygiene: with modified independence  OT Frequency: Min 2X/week    Co-evaluation              AM-PAC OT "6 Clicks" Daily Activity     Outcome Measure Help from another person eating meals?:  None Help from another person taking care of personal grooming?: None Help from another person toileting, which includes using toliet, bedpan, or urinal?: A Little Help from another person bathing (including washing, rinsing, drying)?: A Little Help from another person to put on and taking off regular upper body clothing?: None Help from another person to put on and taking off regular lower body clothing?: A Little 6 Click Score: 21   End of Session Equipment Utilized During Treatment: Rolling walker (2 wheels) Nurse Communication: Mobility status  Activity Tolerance: Patient tolerated treatment well Patient left: in bed;with call bell/phone within reach  OT Visit Diagnosis: Unsteadiness on feet (R26.81)                Time: 5053-9767 OT Time Calculation (min): 13 min Charges:  OT General Charges $OT Visit: 1 Visit OT Evaluation $OT Eval Low Complexity: 1 Low  Shanon Payor, OTD OTR/L  12/07/22, 2:26 PM

## 2022-12-07 NOTE — Assessment & Plan Note (Signed)
Patient states that he has been constantly vomiting since Friday but symptoms started on Thursday.  Fluids yesterday into today.  Patient ate dinner and wanted to go home.  Patient also developed diarrhea and stool studies were negative.  Need to keep up with his losses.  Trial of Reglan just in case diabetic gastroparesis.

## 2022-12-07 NOTE — Assessment & Plan Note (Addendum)
No signs of heart failure currently.  Monitor with giving IV fluids.

## 2022-12-07 NOTE — ED Notes (Signed)
Pt still on toilet having diarrhea. Provided new gown.

## 2022-12-07 NOTE — Assessment & Plan Note (Signed)
IV PPI. 

## 2022-12-07 NOTE — ED Notes (Signed)
Placed on 2L while sleeping. Need repeat BP still, [facility] monitor will not take and IT ticket was placed earlier today.

## 2022-12-07 NOTE — Progress Notes (Signed)
Progress Note   Patient: Bobby Sampson EHU:[id number] DOB: 07/12/1961 DOA: 12/06/2022     1 DOS: the patient was seen and examined on 12/07/2022   Brief hospital course: 62 year old man with past medical history of end-stage renal disease on dialysis, diabetes, recurrent CVA, essential hypertension, diabetic neuropathy.  Patient having persistent nausea vomiting and dizziness over the last few days.  In the ER, MRI of the brain was negative.  Patient was actually feeling better and wanted to go home but then started vomiting again.  Assessment and Plan: * Persistent vomiting Patient states that he has been constantly vomiting since Friday but symptoms started on Thursday.  Feels like he is dehydrated and wanted some fluids.  IV fluids started.  Empiric Protonix and nausea medications.  Trial of Reglan before meals just in case his diabetic gastroparesis.  No diarrhea.  Type 2 diabetes mellitus with hyperglycemia, with long-term current use of insulin (Plain) Patient currently on sliding scale insulin.  Continue to monitor.  Not sure how much he is going to eat today.  Essential hypertension Holding antihypertensive medications today.  Normally takes midodrine prior to dialysis.  Check orthostatic vital signs.  Chronic diastolic CHF (congestive heart failure) (HCC) No signs of heart failure currently.  Monitor with giving IV fluids.  ESRD on dialysis [facility]) Consulted nephrology for dialysis for tomorrow  Anemia in ESRD (end-stage renal disease) ([place]) Last hemoglobin 13.4 but this may be hemoconcentrated from dehydration.  Vertigo I did not see any nystagmus with changing his position.  Not sure if this is the correct diagnosis.  GERD (gastroesophageal reflux disease) IV PPI  Obesity (BMI 30.0-34.9) BMI 33.24 with current height and weight in computer.  Sleep apnea CPAP at night        Subjective: Patient again had vomiting this morning.  Admitted with persistent  vomiting.  Physical Exam: Vitals:   12/07/22 0612 12/07/22 0627 12/07/22 1000 12/07/22 1242  BP: (!) 179/105     Pulse: 85  91 90  Resp: 12  19 20   Temp:  97.7 F (36.5 C)  (!) 97.1 F (36.2 C)  TempSrc:  Oral  Oral  SpO2: 99%  94% 98%  Weight:      Height:       Physical Exam HENT:     Head: Normocephalic.     Mouth/Throat:     Pharynx: No oropharyngeal exudate.  Eyes:     General: Lids are normal.     Conjunctiva/sclera: Conjunctivae normal.  Cardiovascular:     Rate and Rhythm: Normal rate and regular rhythm.     Heart sounds: Normal heart sounds, S1 normal and S2 normal.  Pulmonary:     Breath sounds: No decreased breath sounds, wheezing, rhonchi or rales.  Abdominal:     Palpations: Abdomen is soft.     Tenderness: There is no abdominal tenderness.  Musculoskeletal:     Right lower leg: No swelling.     Left lower leg: No swelling.  Skin:    General: Skin is warm.     Findings: No rash.  Neurological:     Mental Status: He is alert and oriented to person, place, and time.     Comments: No nystagmus seen and lying him back with head to the right and head to the left.     Data Reviewed: Hemoglobin 13.4, creatinine 6.15, LDL 68  Disposition: Status is: Inpatient Remains inpatient appropriate because: With vomiting today I will not send him home.  Watch and  monitor here in the hospital.  Starting IV fluids.  Planned Discharge Destination: Home    Time spent: 28 minutes  Author: Loletha Grayer, MD 12/07/2022 12:50 PM  For on call review [email or web address].

## 2022-12-07 NOTE — ED Notes (Signed)
Pt tried to eat breakfast. Took 1 bite food and felt very nauseous again. Provided more ice chips and water per request.

## 2022-12-07 NOTE — Assessment & Plan Note (Signed)
Last hemoglobin 13.4 but this may be hemoconcentrated from dehydration.

## 2022-12-07 NOTE — ED Notes (Signed)
Pt asleep.

## 2022-12-07 NOTE — Evaluation (Signed)
Physical Therapy Evaluation Patient Details Name: Bobby Sampson MRN: [id number] DOB: 06/08/1961 Today's Date: 12/07/2022  History of Present Illness  Bobby Sampson is a 62 y.o. male with medical history significant of end-stage renal disease, diabetes, recurrent CVA, essential hypertension, diabetic neuropathy, who apparently has been having significant nausea vomiting for the last few days.  He also went to dialysis 12/05/2022.  Patient has been having associated dizziness.  Every little movement of his head he feels the room is spinning around him.  Came to the ER where he was found to have some vertigo.  Patient is being admitted for further evaluation and treatment.  He is currently awake.  Patient felt he was dehydrated in the setting of multiple episodes of vomiting as well as hemodialysis.  Has received some fluid bolus in the ER.  With his history of stroke however suspicion for central vertigo is high. Pt received anti nausea and  Clinical Impression  Pt received in bed in ED agreeable to PT evaluation. Pt reported of being frustrated, weak of nausea and vomiting over past 3 days. Pt also added that he ate some thing he shouldn't have which may have caused this nausea and vomiting. Pt denied spinning with any position and has received medication for Vertigo. Alst emisis was yesterday night as per pt. PT unable to complete BPPV test tp R/O vertigo 2/2 to severe nausea with vomiting without symptoms of spinning. Pt reported of being Ind with household level ambulation with intermittent use of FWW, helps with cooking and Ind with ADLs who lives with wife and children. PT found pt rolled on his side and sat on the EOB with increased nausea resulting in vomiting. SPO2 and vital remained WFL. Pt sat on the EOB for 10 mins. Ambulated to the bathroom with HHA of 2 of min assist and with unsteady gait and returned to the bed using FWW with Min guard of 1. 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 PT will continue in acute care and pt will benefit from HHPT after acute care.         Recommendations for follow up therapy are one component of a multi-disciplinary discharge planning process, led by the attending physician.  Recommendations may be updated based on patient status, additional functional criteria and insurance authorization.  Follow Up Recommendations Home health PT      Assistance Recommended at Discharge Intermittent Supervision/Assistance  Patient can return home with the following  A little help with walking and/or transfers;A little help with bathing/dressing/bathroom;Assistance with cooking/housework;Direct supervision/assist for medications management;Assist for transportation;Help with stairs or ramp for entrance    Equipment Recommendations None recommended by PT  Recommendations for Other Services       Functional Status Assessment Patient has had a recent decline in their functional status and demonstrates the ability to make significant improvements in function in a reasonable and predictable amount of time.     Precautions / Restrictions Precautions Precautions: Fall Restrictions Weight Bearing Restrictions: No      Mobility  Bed Mobility Overal bed mobility: Needs Assistance Bed Mobility: Supine to Sit     Supine to sit: Supervision     General bed mobility comments: nauseous f/b vomiting    Transfers Overall transfer level: Needs assistance Equipment used: Rolling walker (2 wheels) Transfers: Sit to/from Stand, Bed to chair/wheelchair/BSC Sit to Stand: Min guard           General transfer  comment: dizzy with standing  f/b vommiting.    Ambulation/Gait Ambulation/Gait assistance: Min guard (with unsteady gait without LOB. without AD min assist.) Gait Distance (Feet): 20 Feet Assistive device: Rolling walker (2 wheels) Gait Pattern/deviations: Decreased stride length, Narrow base of  support Gait velocity: dec and unsteady     General Gait Details: pt has MTP amputations which may contribute to  unsteady gait.  Stairs: N/A has ramped entrance            Wheelchair Mobility    Modified Rankin (Stroke Patients Only)       Balance      Standing balance fair and sitting balance good                                       Pertinent Vitals/Pain Pain Assessment Pain Assessment: No/denies pain    Home Living Family/patient expects to be discharged to:: Private residence Living Arrangements: Children;Spouse/significant other Available Help at Discharge: Family;Available 24 hours/day Type of Home: House Home Access: Ramped entrance       Home Layout: One level Home Equipment: Cane - quad;Cane - single point;Rollator (4 wheels);Tub bench;BSC/3in1;Rolling Walker (2 wheels);Wheelchair - manual      Prior Function Prior Level of Function : Independent/Modified Independent             Mobility Comments: ambualted with FWW intermittently when feels weak but otherwise Ind at househol level. ADLs Comments: Ind with ADLs. Wife helps with Meds.     Hand Dominance        Extremity/Trunk Assessment   Upper Extremity Assessment Upper Extremity Assessment: Defer to OT evaluation    Lower Extremity Assessment Lower Extremity Assessment: Generalized weakness       Communication   Communication: No difficulties  Cognition Arousal/Alertness: Awake/alert Behavior During Therapy: WFL for tasks assessed/performed Overall Cognitive Status: Within Functional Limits for tasks assessed                                          General Comments      Exercises     Assessment/Plan    PT Assessment Patient needs continued PT services  PT Problem List Decreased strength;Decreased activity tolerance;Decreased balance;Decreased safety awareness       PT Treatment Interventions Gait training;Functional mobility  training;Therapeutic activities;Therapeutic exercise;Balance training;Neuromuscular re-education;Patient/family education    PT Goals (Current goals can be found in the Care Plan section)  Acute Rehab PT Goals Patient Stated Goal: " I want to this vomitting ot go away so I can go home with family." PT Goal Formulation: With patient Time For Goal Achievement: 12/21/22 Potential to Achieve Goals: Good    Frequency Min 2X/week     Co-evaluation               AM-PAC PT "6 Clicks" Mobility  Outcome Measure Help needed turning from your back to your side while in a flat bed without using bedrails?: None Help needed moving from lying on your back to sitting on the side of a flat bed without using bedrails?: A Little Help needed moving to and from a bed to a chair (including a wheelchair)?: A Little Help needed standing up from a chair using your arms (e.g., wheelchair or bedside chair)?: A Little Help needed to walk in hospital room?: A  Little Help needed climbing 3-5 steps with a railing? : A Lot 6 Click Score: 18    End of Session Equipment Utilized During Treatment: Gait belt Activity Tolerance: Treatment limited secondary to medical complications (Comment) (2/2 to vomitting.) Patient left: in bed;with call bell/phone within reach Nurse Communication: Mobility status PT Visit Diagnosis: Unsteadiness on feet (R26.81);Muscle weakness (generalized) (M62.81);Difficulty in walking, not elsewhere classified (R26.2);Dizziness and giddiness (R42)    Time: 0355-9741 PT Time Calculation (min) (ACUTE ONLY): 25 min   Charges:   PT Evaluation $PT Eval Low Complexity: 1 Low PT Treatments $Gait Training: 8-22 mins      Dorothymae Maciver PT DPT 12:16 PM,12/07/22

## 2022-12-07 NOTE — Assessment & Plan Note (Signed)
Patient currently on sliding scale insulin.  Continue to monitor.  Restart long-acting insulin 16 units once a day.  Continue short acting insulin prior to meals as long as he is eating.

## 2022-12-07 NOTE — ED Notes (Signed)
Pt continues to vomit

## 2022-12-07 NOTE — ED Notes (Signed)
Pt walked to bathroomwtih walker andback. Pt had 1 runny BM.

## 2022-12-07 NOTE — Assessment & Plan Note (Signed)
-   CPAP at night °

## 2022-12-07 NOTE — Assessment & Plan Note (Signed)
BMI 33.24 with current height and weight in computer.

## 2022-12-07 NOTE — Progress Notes (Signed)
  Echocardiogram 2D Echocardiogram has been performed. Definity IV ultrasound imaging agent used on this study.  Claretta Fraise 12/07/2022, 2:26 PM

## 2022-12-07 NOTE — Assessment & Plan Note (Signed)
Seen while on dialysis today.  And again this evening.

## 2022-12-07 NOTE — Assessment & Plan Note (Signed)
I did not see any nystagmus with changing his position.  Not sure if this is the correct diagnosis.

## 2022-12-07 NOTE — Progress Notes (Signed)
SLP Cancellation Note  Patient Details Name: Bobby Sampson MRN: [id number] DOB: 02-May-1961   Cancelled treatment:       Reason Eval/Treat Not Completed: SLP screened, no needs identified, will sign off   SLP consult received and appreciated. Chart review completed. Per chart review, CT and MRI negative for acute findings. No focal neuro s/sx. NIHSS = 0.  Cherrie Gauze, M.S., [facility] [phone] [name])  [name] 12/07/2022, 8:18 AM

## 2022-12-07 NOTE — ED Notes (Signed)
Dr Earleen Newport at bedside.

## 2022-12-08 DIAGNOSIS — N186 End stage renal disease: Secondary | ICD-10-CM | POA: Diagnosis not present

## 2022-12-08 DIAGNOSIS — R1115 Cyclical vomiting syndrome unrelated to migraine: Secondary | ICD-10-CM | POA: Diagnosis not present

## 2022-12-08 DIAGNOSIS — R197 Diarrhea, unspecified: Secondary | ICD-10-CM

## 2022-12-08 DIAGNOSIS — D638 Anemia in other chronic diseases classified elsewhere: Secondary | ICD-10-CM

## 2022-12-08 DIAGNOSIS — I10 Essential (primary) hypertension: Secondary | ICD-10-CM | POA: Diagnosis not present

## 2022-12-08 LAB — GASTROINTESTINAL PANEL BY PCR, STOOL (REPLACES STOOL CULTURE)

## 2022-12-08 LAB — CBC
HCT: 42.5 % (ref 39.0–52.0)
Hemoglobin: 13.2 g/dL (ref 13.0–17.0)
MCH: 25.8 pg — ABNORMAL LOW (ref 26.0–34.0)
MCHC: 31.1 g/dL (ref 30.0–36.0)
MCV: 83 fL (ref 80.0–100.0)
Platelets: 262 10*3/uL (ref 150–400)
RBC: 5.12 MIL/uL (ref 4.22–5.81)
RDW: 14.9 % (ref 11.5–15.5)
WBC: 10 10*3/uL (ref 4.0–10.5)
nRBC: 0 % (ref 0.0–0.2)

## 2022-12-08 LAB — RENAL FUNCTION PANEL
Albumin: 3.8 g/dL (ref 3.5–5.0)
Anion gap: 13 (ref 5–15)
BUN: 50 mg/dL — ABNORMAL HIGH (ref 8–23)
CO2: 29 mmol/L (ref 22–32)
Calcium: 9 mg/dL (ref 8.9–10.3)
Chloride: 97 mmol/L — ABNORMAL LOW (ref 98–111)
Creatinine, Ser: 6.33 mg/dL — ABNORMAL HIGH (ref 0.61–1.24)
GFR, Estimated: 9 mL/min — ABNORMAL LOW (ref >60)
Glucose, Bld: 176 mg/dL — ABNORMAL HIGH (ref 70–99)
Phosphorus: 4.8 mg/dL — ABNORMAL HIGH (ref 2.5–4.6)
Potassium: 3.4 mmol/L — ABNORMAL LOW (ref 3.5–5.1)
Sodium: 139 mmol/L (ref 135–145)

## 2022-12-08 LAB — HEPATITIS B CORE ANTIBODY, TOTAL: Hep B Core Total Ab: NONREACTIVE

## 2022-12-08 LAB — C DIFFICILE QUICK SCREEN W PCR REFLEX
C Diff antigen: NEGATIVE
C Diff interpretation: NOT DETECTED
C Diff toxin: NEGATIVE

## 2022-12-08 LAB — HEMOGLOBIN A1C
Hgb A1c MFr Bld: 8 % — ABNORMAL HIGH (ref 4.8–5.6)
Mean Plasma Glucose: 183 mg/dL

## 2022-12-08 LAB — CBG MONITORING, ED: Glucose-Capillary: 151 mg/dL — ABNORMAL HIGH (ref 70–99)

## 2022-12-08 LAB — HEPATITIS B SURFACE ANTIGEN: Hepatitis B Surface Ag: NONREACTIVE

## 2022-12-08 LAB — HEPATITIS B CORE ANTIBODY, IGM: Hep B C IgM: NONREACTIVE

## 2022-12-08 MED ORDER — MIDODRINE HCL 5 MG PO TABS: 2.5000 mg | ORAL_TABLET | ORAL | Status: DC

## 2022-12-08 MED ORDER — METOPROLOL SUCCINATE ER 25 MG PO TB24: 25.0000 mg | ORAL_TABLET | Freq: Every day | ORAL | Status: DC

## 2022-12-08 MED ORDER — METOPROLOL SUCCINATE ER 50 MG PO TB24
50.0000 mg | ORAL_TABLET | Freq: Every day | ORAL | Status: DC
  Administered 2022-12-08: 50 mg via ORAL
  Filled 2022-12-08: qty 1

## 2022-12-08 MED ORDER — PENTAFLUOROPROP-TETRAFLUOROETH EX AERO
INHALATION_SPRAY | CUTANEOUS | Status: AC
  Administered 2022-12-08: 1 via TOPICAL
  Filled 2022-12-08: qty 30

## 2022-12-08 MED ORDER — LANTUS SOLOSTAR 100 UNIT/ML ~~LOC~~ SOPN: 16.0000 [IU] | PEN_INJECTOR | Freq: Every day | SUBCUTANEOUS | 11 refills | Status: DC

## 2022-12-08 MED ORDER — AMLODIPINE BESYLATE 5 MG PO TABS
2.5000 mg | ORAL_TABLET | Freq: Every day | ORAL | Status: DC
  Administered 2022-12-08: 2.5 mg via ORAL
  Filled 2022-12-08: qty 1

## 2022-12-08 MED ORDER — PROMETHAZINE HCL 12.5 MG PO TABS: 12.5000 mg | ORAL_TABLET | Freq: Four times a day (QID) | ORAL | 0 refills | Status: DC | PRN

## 2022-12-08 MED ORDER — METOCLOPRAMIDE HCL 5 MG PO TABS: 5.0000 mg | ORAL_TABLET | Freq: Three times a day (TID) | ORAL | 0 refills | Status: DC

## 2022-12-08 MED ORDER — PANTOPRAZOLE SODIUM 40 MG PO TBEC: 40.0000 mg | DELAYED_RELEASE_TABLET | Freq: Every day | ORAL | 0 refills | Status: AC

## 2022-12-08 MED ORDER — LOPERAMIDE HCL 2 MG PO CAPS: 2.0000 mg | ORAL_CAPSULE | Freq: Three times a day (TID) | ORAL | Status: DC | PRN

## 2022-12-08 MED ORDER — NOVOLOG FLEXPEN 100 UNIT/ML ~~LOC~~ SOPN: 7.0000 [IU] | PEN_INJECTOR | Freq: Three times a day (TID) | SUBCUTANEOUS | Status: AC

## 2022-12-08 NOTE — Assessment & Plan Note (Signed)
Hemoglobin 13.2

## 2022-12-08 NOTE — Progress Notes (Signed)
Patient did 3.15 hours of HD, tolerated well.  UF: 0    12/08/22 1256  Vitals  Temp 97.6 F (36.4 C)  Temp Source Oral  BP (!) 187/99  MAP (mmHg) 123  BP Location Left Arm  BP Method Automatic  Patient Position (if appropriate) Lying  Pulse Rate 95  Pulse Rate Source Monitor  ECG Heart Rate 95  Resp 18  Oxygen Therapy  SpO2 100 %  O2 Device Room Air  Patient Activity (if Appropriate) In bed  Pulse Oximetry Type Continuous  Post Treatment  Dialyzer Clearance Lightly streaked  Duration of HD Treatment -hour(s) 3.15 hour(s)  Hemodialysis Intake (mL) 0 mL  Liters Processed 68.3  Fluid Removed (mL) 0 mL  Tolerated HD Treatment Yes  AVG/AVF Arterial Site Held (minutes) 5 minutes  AVG/AVF Venous Site Held (minutes) 5 minutes  Fistula / Graft Right Upper arm Arteriovenous fistula  No placement date or time found.   Placed prior to admission: Yes  Orientation: Right  Access Location: Upper arm  Access Type: Arteriovenous fistula  Site Condition No complications  Fistula / Graft Assessment Present;Thrill;Bruit  Drainage Description None

## 2022-12-08 NOTE — Assessment & Plan Note (Signed)
Stool studies negative.  As needed Imodium 

## 2022-12-08 NOTE — ED Notes (Signed)
Took phone and bag of belongings to patient's RN Conservation officer, historic buildings) for when he returns from dialysis 1259

## 2022-12-08 NOTE — Progress Notes (Signed)
[facility]  ROUNDING NOTE   Subjective:   Patient seen and evaluated during dialysis   HEMODIALYSIS FLOWSHEET:  Blood Flow Rate (mL/min): 350 mL/min Arterial Pressure (mmHg): -190 mmHg Venous Pressure (mmHg): 250 mmHg TMP (mmHg): 0 mmHg Ultrafiltration Rate (mL/min): 141 mL/min Dialysate Flow Rate (mL/min): 300 ml/min  Patient continues to have bouts of diarrhea, 1 episode occurring during dialysis Poor oral intake persist No lower extremity edema Room air   Objective:  Vital signs in last 24 hours:  Temp:  [97.4 F (36.3 C)-97.6 F (36.4 C)] 97.6 F (36.4 C) (01/08 1256) Pulse Rate:  [81-101] 95 (01/08 1256) Resp:  [11-22] 18 (01/08 1256) BP: (144-209)/(64-106) 187/99 (01/08 1256) SpO2:  [88 %-100 %] 100 % (01/08 1256) Weight:  [97.6 kg-97.8 kg] 97.6 kg (01/08 1300)  Weight change:  Filed Weights   12/06/22 1315 12/08/22 0844 12/08/22 1300  Weight: 102.1 kg 97.8 kg 97.6 kg    Intake/Output: I/O last 3 completed shifts: In: 500 [IV Piggyback:500] Out: 100 [Emesis/NG output:100]   Intake/Output this shift:  No intake/output data recorded.  Physical Exam: General: NAD  Head: Normocephalic, atraumatic. Moist oral mucosal membranes  Eyes: Anicteric  Lungs:  Clear to auscultation, normal effort, room air  Heart: Regular rate and rhythm  Abdomen:  Soft, nontender,   Extremities: No peripheral edema.  Neurologic: Nonfocal, moving all four extremities  Skin: No lesions  Access: Right aVF    Basic Metabolic Panel: Recent Labs  Lab 12/06/22 1332 12/07/22 0624 12/08/22 0254  NA 138  --  139  K 4.7  --  3.4*  CL 94*  --  97*  CO2 27  --  29  GLUCOSE 260*  --  176*  BUN 31*  --  50*  CREATININE 5.00* 6.15* 6.33*  CALCIUM 8.9  --  9.0  MG 2.1  --   --   PHOS 4.2  --  4.8*    Liver Function Tests: Recent Labs  Lab 12/06/22 1332 12/08/22 0254  AST 44*  --   ALT 23  --   ALKPHOS 104  --   BILITOT 1.6*  --   PROT 8.2*  --   ALBUMIN  4.0 3.8   No results for input(s): "LIPASE", "AMYLASE" in the last 168 hours. No results for input(s): "AMMONIA" in the last 168 hours.  CBC: Recent Labs  Lab 12/06/22 1332 12/07/22 0624 12/08/22 0254  WBC 9.6 10.7* 10.0  HGB 13.8 13.4 13.2  HCT 43.5 42.6 42.5  MCV 81.9 83.5 83.0  PLT 278 245 262    Cardiac Enzymes: No results for input(s): "CKTOTAL", "CKMB", "CKMBINDEX", "TROPONINI" in the last 168 hours.  BNP: Invalid input(s): "POCBNP"  CBG: Recent Labs  Lab 12/07/22 0819 12/07/22 1244 12/07/22 1658 12/07/22 2149 12/08/22 1436  GLUCAP 188* 206* 213* 157* 151*    Microbiology: Results for orders placed or performed during the hospital encounter of 12/06/22  Resp panel by RT-PCR (RSV, Flu A&B, Covid) Anterior Nasal Swab     Status: None   Collection Time: 12/06/22  1:32 PM   Specimen: Anterior Nasal Swab  Result Value Ref Range Status   SARS Coronavirus 2 by RT PCR NEGATIVE NEGATIVE Final    Comment: (NOTE) SARS-CoV-2 target nucleic acids are NOT DETECTED.  The SARS-CoV-2 RNA is generally detectable in upper respiratory specimens during the acute phase of infection. The lowest concentration of SARS-CoV-2 viral copies this assay can detect is 138 copies/mL. A negative result does not preclude  SARS-Cov-2 infection and should not be used as the sole basis for treatment or other patient management decisions. A negative result may occur with  improper specimen collection/handling, submission of specimen other than nasopharyngeal swab, presence of viral mutation(s) within the areas targeted by this assay, and inadequate number of viral copies(<138 copies/mL). A negative result must be combined with clinical observations, patient history, and epidemiological information. The expected result is Negative.  Fact Sheet for Patients:  EntrepreneurPulse.com.au  Fact Sheet for Healthcare Providers:  IncredibleEmployment.be  This  test is no t yet approved or cleared by the [place] FDA and  has been authorized for detection and/or diagnosis of SARS-CoV-2 by FDA under an Emergency Use Authorization (EUA). This EUA will remain  in effect (meaning this test can be used) for the duration of the COVID-19 declaration under Section 564(b)(1) of the Act, 21 U.S.C.section 360bbb-3(b)(1), unless the authorization is terminated  or revoked sooner.       Influenza A by PCR NEGATIVE NEGATIVE Final   Influenza B by PCR NEGATIVE NEGATIVE Final    Comment: (NOTE) The Xpert Xpress SARS-CoV-2/FLU/RSV plus assay is intended as an aid in the diagnosis of influenza from Nasopharyngeal swab specimens and should not be used as a sole basis for treatment. Nasal washings and aspirates are unacceptable for Xpert Xpress SARS-CoV-2/FLU/RSV testing.  Fact Sheet for Patients: EntrepreneurPulse.com.au  Fact Sheet for Healthcare Providers: IncredibleEmployment.be  This test is not yet approved or cleared by the [place] FDA and has been authorized for detection and/or diagnosis of SARS-CoV-2 by FDA under an Emergency Use Authorization (EUA). This EUA will remain in effect (meaning this test can be used) for the duration of the COVID-19 declaration under Section 564(b)(1) of the Act, 21 U.S.C. section 360bbb-3(b)(1), unless the authorization is terminated or revoked.     Resp Syncytial Virus by PCR NEGATIVE NEGATIVE Final    Comment: (NOTE) Fact Sheet for Patients: EntrepreneurPulse.com.au  Fact Sheet for Healthcare Providers: IncredibleEmployment.be  This test is not yet approved or cleared by the [place] FDA and has been authorized for detection and/or diagnosis of SARS-CoV-2 by FDA under an Emergency Use Authorization (EUA). This EUA will remain in effect (meaning this test can be used) for the duration of the COVID-19 declaration under  Section 564(b)(1) of the Act, 21 U.S.C. section 360bbb-3(b)(1), unless the authorization is terminated or revoked.  Performed at [facility], [place], [place] [address]   C Difficile Quick Screen w PCR reflex     Status: None   Collection Time: 12/08/22  2:54 AM   Specimen: STOOL  Result Value Ref Range Status   C Diff antigen NEGATIVE NEGATIVE Final   C Diff toxin NEGATIVE NEGATIVE Final   C Diff interpretation No C. difficile detected.  Final    Comment: VALID Performed at [facility], [address] [place], [place] [address]   Gastrointestinal Panel by PCR , Stool     Status: None   Collection Time: 12/08/22  2:54 AM   Specimen: Stool  Result Value Ref Range Status   Campylobacter species NOT DETECTED NOT DETECTED Final   Plesimonas shigelloides NOT DETECTED NOT DETECTED Final   Salmonella species NOT DETECTED NOT DETECTED Final   Yersinia enterocolitica NOT DETECTED NOT DETECTED Final   Vibrio species NOT DETECTED NOT DETECTED Final   Vibrio cholerae NOT DETECTED NOT DETECTED Final   Enteroaggregative E coli (EAEC) NOT DETECTED NOT DETECTED Final   Enteropathogenic E coli (  EPEC) NOT DETECTED NOT DETECTED Final   Enterotoxigenic E coli (ETEC) NOT DETECTED NOT DETECTED Final   Shiga like toxin producing E coli (STEC) NOT DETECTED NOT DETECTED Final   Shigella/Enteroinvasive E coli (EIEC) NOT DETECTED NOT DETECTED Final   Cryptosporidium NOT DETECTED NOT DETECTED Final   Cyclospora cayetanensis NOT DETECTED NOT DETECTED Final   Entamoeba histolytica NOT DETECTED NOT DETECTED Final   Giardia lamblia NOT DETECTED NOT DETECTED Final   Adenovirus F40/41 NOT DETECTED NOT DETECTED Final   Astrovirus NOT DETECTED NOT DETECTED Final   Norovirus GI/GII NOT DETECTED NOT DETECTED Final   Rotavirus A NOT DETECTED NOT DETECTED Final   Sapovirus (I, II, IV, and V) NOT DETECTED NOT DETECTED Final    Comment: Performed at [facility], [address] [place], [place] [address]    Coagulation Studies: No results for input(s): "LABPROT", "INR" in the last 72 hours.  Urinalysis: No results for input(s): "COLORURINE", "LABSPEC", "PHURINE", "GLUCOSEU", "HGBUR", "BILIRUBINUR", "KETONESUR", "PROTEINUR", "UROBILINOGEN", "NITRITE", "LEUKOCYTESUR" in the last 72 hours.  Invalid input(s): "APPERANCEUR"    Imaging: ECHOCARDIOGRAM COMPLETE  Result Date: 12/07/2022    ECHOCARDIOGRAM REPORT   Patient Name:   Bobby Sampson Date of Exam: 12/07/2022 Medical Rec #:  [id number]  Height:       69.0 in Accession #:    [id number] Weight:       225.1 lb Date of Birth:  [birth year] BSA:          2.172 m Patient Age:    57 years   BP:           179/105 mmHg Patient Gender: M          HR:           88 bpm. Exam Location:  [facility] Procedure: 2D Echo and Intracardiac Opacification Agent Indications:     Stroke I63.9  History:         Patient has prior history of Echocardiogram examinations, most                  recent 02/16/2021.  Sonographer:     Kathlen Brunswick RDCS Referring Phys:  [place] Diagnosing Phys: Neoma Laming  Sonographer Comments: Suboptimal apical window. Image acquisition challenging due to respiratory motion. IMPRESSIONS  1. Left ventricular ejection fraction, by estimation, is 55 to 60%. The left ventricle has normal function. The left ventricle has no regional wall motion abnormalities. Left ventricular diastolic parameters are consistent with Grade I diastolic dysfunction (impaired relaxation).  2. Right ventricular systolic function is normal. The right ventricular size is normal.  3. Left atrial size was mildly dilated.  4. Right atrial size was mildly dilated.  5. The mitral valve is normal in structure. Trivial mitral valve regurgitation. No evidence of mitral stenosis.  6. The aortic valve is normal in structure. Aortic valve regurgitation is not visualized. No aortic stenosis is present.  7. The inferior vena cava is normal in size  with greater than 50% respiratory variability, suggesting right atrial pressure of 3 mmHg. FINDINGS  Left Ventricle: Left ventricular ejection fraction, by estimation, is 55 to 60%. The left ventricle has normal function. The left ventricle has no regional wall motion abnormalities. Definity contrast agent was given IV to delineate the left ventricular  endocardial borders. The left ventricular internal cavity size was normal in size. There is no left ventricular hypertrophy. Left ventricular diastolic parameters are consistent with Grade I diastolic dysfunction (impaired relaxation). Right Ventricle:  The right ventricular size is normal. No increase in right ventricular wall thickness. Right ventricular systolic function is normal. Left Atrium: Left atrial size was mildly dilated. Right Atrium: Right atrial size was mildly dilated. Pericardium: There is no evidence of pericardial effusion. Mitral Valve: The mitral valve is normal in structure. Trivial mitral valve regurgitation. No evidence of mitral valve stenosis. Tricuspid Valve: The tricuspid valve is normal in structure. Tricuspid valve regurgitation is trivial. No evidence of tricuspid stenosis. Aortic Valve: The aortic valve is normal in structure. Aortic valve regurgitation is not visualized. No aortic stenosis is present. Aortic valve peak gradient measures 6.2 mmHg. Pulmonic Valve: The pulmonic valve was normal in structure. Pulmonic valve regurgitation is not visualized. No evidence of pulmonic stenosis. Aorta: The aortic root is normal in size and structure. Venous: The inferior vena cava is normal in size with greater than 50% respiratory variability, suggesting right atrial pressure of 3 mmHg. IAS/Shunts: No atrial level shunt detected by color flow Doppler.  LEFT VENTRICLE PLAX 2D LVIDd:         4.40 cm     Diastology LVIDs:         2.97 cm     LV e' medial:    7.07 cm/s LV PW:         1.25 cm     LV E/e' medial:  8.5 LV IVS:        1.10 cm     LV e'  lateral:   10.70 cm/s LVOT diam:     2.10 cm     LV E/e' lateral: 5.6 LV SV:         65 LV SV Index:   30 LVOT Area:     3.46 cm  LV Volumes (MOD) LV vol d, MOD A2C: 83.3 ml LV vol d, MOD A4C: 90.2 ml LV vol s, MOD A2C: 23.6 ml LV vol s, MOD A4C: 33.8 ml LV SV MOD A2C:     59.7 ml LV SV MOD A4C:     90.2 ml LV SV MOD BP:      60.1 ml RIGHT VENTRICLE RV Basal diam:  2.40 cm RV S prime:     15.70 cm/s LEFT ATRIUM             Index        RIGHT ATRIUM           Index LA diam:        4.00 cm 1.84 cm/m   RA Area:     10.10 cm LA Vol (A2C):   36.7 ml 16.89 ml/m  RA Volume:   20.20 ml  9.30 ml/m LA Vol (A4C):   39.6 ml 18.23 ml/m LA Biplane Vol: 38.8 ml 17.86 ml/m  AORTIC VALVE                 PULMONIC VALVE AV Area (Vmax): 2.34 cm     PV Vmax:       0.83 m/s AV Vmax:        125.00 cm/s  PV Peak grad:  2.8 mmHg AV Peak Grad:   6.2 mmHg LVOT Vmax:      84.60 cm/s LVOT Vmean:     53.200 cm/s LVOT VTI:       0.188 m  AORTA Ao Root diam: 3.30 cm Ao Asc diam:  2.60 cm MITRAL VALVE MV Area (PHT): 5.27 cm    SHUNTS MV Decel Time: 144 msec    Systemic VTI:  0.19  m MV E velocity: 60.30 cm/s  Systemic Diam: 2.10 cm MV A velocity: 94.90 cm/s MV E/A ratio:  0.64 Shaukat Khan Electronically signed by Neoma Laming Signature Date/Time: 12/07/2022/5:31:29 PM    Final    US Carotid Bilateral (at [facility] and [facility] only)  Result Date: 12/07/2022 CLINICAL DATA:  CVA EXAM: BILATERAL CAROTID DUPLEX ULTRASOUND TECHNIQUE: Pearline Cables scale imaging, color Doppler and duplex ultrasound were performed of bilateral carotid and vertebral arteries in the neck. COMPARISON:  None Available. FINDINGS: Criteria: Quantification of carotid stenosis is based on velocity parameters that correlate the residual internal carotid diameter with NASCET-based stenosis levels, using the diameter of the distal internal carotid lumen as the denominator for stenosis measurement. The following velocity measurements were obtained: RIGHT ICA: 63/18 cm/sec CCA: 39/76 cm/sec  SYSTOLIC ICA/CCA RATIO: 0.7 ECA: 115 cm/sec LEFT ICA: 77/27 cm/sec CCA: 734/19 cm/sec SYSTOLIC ICA/CCA RATIO: 0.6 ECA: 100 cm/sec RIGHT CAROTID ARTERY: Mild atheromatous plaque at the bifurcation. RIGHT VERTEBRAL ARTERY:  Antegrade with normal waveform LEFT CAROTID ARTERY:  Mild atheromatous plaque at the bifurcation LEFT VERTEBRAL ARTERY:  Antegrade with normal waveform IMPRESSION: 1. Mild atheromatous plaque of the cervical carotids with no evidence of significant stenosis. 2. Normal vertebral artery waveforms. Electronically Signed   By: Jorje Guild M.D.   On: 12/07/2022 10:03   MR BRAIN WO CONTRAST  Result Date: 12/07/2022 CLINICAL DATA:  Neuro deficit with acute stroke suspected EXAM: MRI HEAD WITHOUT CONTRAST TECHNIQUE: Multiplanar, multiecho pulse sequences of the brain and surrounding structures were obtained without intravenous contrast. COMPARISON:  Head CT from yesterday FINDINGS: Brain: No acute infarction, hemorrhage, hydrocephalus, extra-axial collection or mass lesion. Volume loss and lacunar/gliotic changes in the brainstem attributed to chronic infarcts with chronic blood products and emanating wallerian changes. Findings more likely post ischemic than from neuro degenerative disorder given prior imaging. Chronic lacune also seen at the lower left thalamus. Milder chronic small vessel ischemia in the cerebral white matter. Distinct remote white matter insult with wallerian changes crossing the genu of the corpus callosum. Vascular: Major flow voids are preserved Skull and upper cervical spine: No focal marrow lesion Sinuses/Orbits: Bilateral cataract resection IMPRESSION: 1. No acute finding. 2. 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 Electronically Signed   By: Jorje Guild M.D.   On: 12/07/2022 06:08   CT Head Wo Contrast  Result Date: 12/06/2022 CLINICAL DATA:  Stroke suspected, headache. EXAM: CT HEAD WITHOUT CONTRAST TECHNIQUE: Contiguous axial images  were obtained from the base of the skull through the vertex without intravenous contrast. RADIATION DOSE REDUCTION: This exam was performed according to the departmental dose-optimization program which includes automated exposure control, adjustment of the mA and/or kV according to patient size and/or use of iterative reconstruction technique. COMPARISON:  CT head 02/15/2021 FINDINGS: Brain: No evidence of acute infarction, hemorrhage, hydrocephalus, extra-axial collection or mass lesion/mass effect. Vascular: Atherosclerotic calcifications are present within the cavernous internal carotid arteries. Skull: Normal. Negative for fracture or focal lesion. Sinuses/Orbits: No acute finding. Other: Right occipital scalp scarring is again noted. IMPRESSION: No acute intracranial process. Electronically Signed   By: Ronney Asters M.D.   On: 12/06/2022 16:58     Medications:    promethazine (PHENERGAN) injection (IM or IVPB) Stopped (12/06/22 2227)     stroke: early stages of recovery book   Does not apply Once   amLODipine  2.5 mg Oral Daily   aspirin EC  81 mg Oral Daily   buPROPion  150 mg Oral  Daily   calcium acetate  667 mg Oral TID WC   Chlorhexidine Gluconate Cloth  6 each Topical Q0600   FLUoxetine  20 mg Oral Daily   heparin  5,000 Units Subcutaneous Q8H   insulin aspart  0-5 Units Subcutaneous QHS   insulin aspart  0-9 Units Subcutaneous TID WC   latanoprost  1 drop Both Eyes QHS   melatonin  5 mg Oral QHS   metoCLOPramide (REGLAN) injection  5 mg Intravenous TID AC   metoprolol succinate  50 mg Oral Daily   midodrine  2.5 mg Oral Q M,W,F-HD   multivitamin  1 tablet Oral QHS   pantoprazole (PROTONIX) IV  40 mg Intravenous Q12H   pentafluoroprop-tetrafluoroeth       acetaminophen **OR** acetaminophen (TYLENOL) oral liquid 160 mg/5 mL **OR** acetaminophen, loperamide, meclizine, pentafluoroprop-tetrafluoroeth, promethazine (PHENERGAN) injection (IM or IVPB), senna-docusate  Assessment/  Plan:  Mr. Maijor Hornig is a 62 y.o.  male with history of diabetes, peripheral vascular disease, hypertension, coronary artery disease, history of CVA and end-stage renal disease on dialysis on a Monday Wednesday Friday schedule now admitted with history of intractable vomiting.    End-stage renal disease on hemodialysis.  Will maintain outpatient schedule if possible.  Patient received dialysis today, UF 0.  Patient continues to experience fluid loss via GI losses.  Next treatment scheduled for Wednesday.  2. Anemia of chronic kidney disease  Lab Results  Component Value Date   HGB 13.2 12/08/2022  Hemoglobin above desired range.  No need for ESA's at this time  3. Secondary Hyperparathyroidism: with outpatient labs: PTH 240, phosphorus 3.4, calcium 9.2 on 12/03/2022.   Lab Results  Component Value Date   CALCIUM 9.0 12/08/2022   PHOS 4.8 (H) 12/08/2022    Bone minerals acceptable.  Will continue to monitor.  4.  Hypertension with chronic kidney disease.  Home regimen includes amlodipine and metoprolol.  Patient also prescribed midodrine on dialysis days.  Only midodrine at this time however blood pressure elevated during dialysis 170s over 100s.  Home medications restarted.    LOS: 2 Calvin 1/8/20242:53 PM

## 2022-12-08 NOTE — Progress Notes (Signed)
PT Cancellation Note  Patient Details Name: Bobby Sampson MRN: [id number] DOB: 1961-07-08   Cancelled Treatment:     Pt at dialysis will re-attempt next available date/time. Pt continues to have N/V and diarrhea. Progress as able.   Josie Dixon 12/08/2022, 11:21 AM

## 2022-12-08 NOTE — Progress Notes (Signed)
Progress Note   Patient: Bobby Sampson PJA:[id number] DOB: [birth year] DOA: 12/06/2022     2 DOS: the patient was seen and examined on 12/08/2022   Brief hospital course: 62 year old man with past medical history of end-stage renal disease on dialysis, diabetes, recurrent CVA, essential hypertension, diabetic neuropathy.  Patient having persistent nausea vomiting and dizziness over the last few days.  In the ER, MRI of the brain was negative.  Patient was actually feeling better and wanted to go home but then started vomiting again.  Assessment and Plan: * Persistent vomiting Patient states that he has been constantly vomiting since Friday but symptoms started on Thursday.  Fluids yesterday into today.  Would like to see the patient tolerated solid food prior to disposition.  Patient also developed diarrhea and stool studies were negative.  Need to keep up with his losses.  Trial of Reglan just in case diabetic gastroparesis.  Diarrhea Stool studies negative.  As needed Imodium.  Type 2 diabetes mellitus with hyperglycemia, with long-term current use of insulin ([place]) Patient currently on sliding scale insulin.  Continue to monitor.  Not sure how much he is going to eat today.  Essential hypertension Restart Toprol and Norvasc.  Chronic diastolic CHF (congestive heart failure) (HCC) No signs of heart failure currently.  Dialysis today.  Okay to stop fluids.  ESRD on dialysis [facility]) Seen while on dialysis today.  Vertigo I did not see any nystagmus with changing his position.  Not sure if this is the correct diagnosis.  GERD (gastroesophageal reflux disease) IV PPI  Obesity (BMI 30.0-34.9) BMI 31.77 with current height and weight in computer.  Sleep apnea CPAP at night  Anemia of chronic disease Hemoglobin 13.2        Subjective: Patient feeling a little bit better today.  Seen while on dialysis.  No further vomiting but did not eat dinner last night or breakfast this morning.   Only wanted some toast.  Last night started developing diarrhea and could not make it to the bathroom in time.  This morning he did have more diarrhea.  Stool studies negative.  Admitted with persistent vomiting.  Physical Exam: Vitals:   12/08/22 1200 12/08/22 1245 12/08/22 1256 12/08/22 1300  BP: (!) 168/95 (!) 195/101 (!) 187/99   Pulse: (!) 101 100 95   Resp: 18 18 18    Temp:   97.6 F (36.4 C)   TempSrc:   Oral   SpO2: 99% 98% 100%   Weight:    97.6 kg  Height:       Physical Exam HENT:     Head: Normocephalic.     Mouth/Throat:     Pharynx: No oropharyngeal exudate.  Eyes:     General: Lids are normal.     Conjunctiva/sclera: Conjunctivae normal.  Cardiovascular:     Rate and Rhythm: Normal rate and regular rhythm.     Heart sounds: Normal heart sounds, S1 normal and S2 normal.  Pulmonary:     Breath sounds: No decreased breath sounds, wheezing, rhonchi or rales.  Abdominal:     Palpations: Abdomen is soft.     Tenderness: There is no abdominal tenderness.  Musculoskeletal:     Right lower leg: No swelling.     Left lower leg: No swelling.  Skin:    General: Skin is warm.     Findings: No rash.  Neurological:     Mental Status: He is alert and oriented to person, place, and time.  Data Reviewed: Potassium 3.4, creatinine 6.33, hemoglobin 13.2 platelet count 262   Disposition: Status is: Inpatient Remains inpatient appropriate because: Need to see if he is able to tolerate food.  Would like to see if diarrhea settles down.  Adding back blood pressure medications.  Planned Discharge Destination: Home    Time spent: 28 minutes Case discussed with nephrology.  Author: Loletha Grayer, MD 12/08/2022 2:24 PM  For on call review [email or web address].

## 2022-12-08 NOTE — Discharge Summary (Signed)
Physician Discharge Summary   Patient: Bobby Sampson MRN: [id number] DOB: 10/16/1961  Admit date:     12/06/2022  Discharge date: 12/08/22  Discharge Physician: Loletha Grayer   PCP: [facility]   Recommendations at discharge:   Follow-up PCP 5 days  Discharge Diagnoses: Principal Problem:   Persistent vomiting Active Problems:   Diarrhea   Essential hypertension   Type 2 diabetes mellitus with hyperglycemia, with long-term current use of insulin (HCC)   Chronic diastolic CHF (congestive heart failure) (HCC)   ESRD on dialysis (HCC)   Vertigo   GERD (gastroesophageal reflux disease)   Obesity (BMI 30.0-34.9)   Sleep apnea   Anemia of chronic disease    Hospital Course: 62 year old man with past medical history of end-stage renal disease on dialysis, diabetes, recurrent CVA, essential hypertension, diabetic neuropathy.  Patient having persistent nausea vomiting and dizziness over the last few days.  In the ER, MRI of the brain was negative.  Patient was actually feeling better and wanted to go home but then started vomiting again.  1/8.  Patient had diarrhea overnight.  Stool studies were negative.  Patient did not eat breakfast this morning but did eat some toast.  No further nausea or vomiting.  Called this evening to see the patient because he felt better and wanted to go home.  He tolerated dinner.    Assessment and Plan: * Persistent vomiting Patient states that he has been constantly vomiting since Friday but symptoms started on Thursday.  Fluids yesterday into today.  Patient ate dinner and wanted to go home.  Patient also developed diarrhea and stool studies were negative.  Need to keep up with his losses.  Trial of Reglan just in case diabetic gastroparesis.  Diarrhea Stool studies negative.  As needed Imodium.  Type 2 diabetes mellitus with hyperglycemia, with long-term current use of insulin ([place]) Patient currently on sliding scale insulin.   Continue to monitor.  Restart long-acting insulin 16 units once a day.  Continue short acting insulin prior to meals as long as he is eating.  Essential hypertension Restart Toprol and Norvasc.  Chronic diastolic CHF (congestive heart failure) (HCC) No signs of heart failure currently.  Dialysis today.  Okay to stop fluids.  ESRD on dialysis [facility]) Seen while on dialysis today.  And again this evening.  Vertigo I did not see any nystagmus with changing his position.  Not sure if this is the correct diagnosis.  GERD (gastroesophageal reflux disease) IV PPI switch to oral.  Obesity (BMI 30.0-34.9) BMI 31.77 with current height and weight in computer.  Sleep apnea CPAP at night  Anemia of chronic disease Hemoglobin 13.2         Consultants: Nephrology Procedures performed: Dialysis Disposition: Home Diet recommendation:  Cardiac and Carb modified diet DISCHARGE MEDICATION: Allergies as of 12/08/2022       Reactions   Liraglutide Nausea And Vomiting   Other reaction(s): Nausea and vomiting, Fatigue   Trulicity [dulaglutide] Nausea And Vomiting        Medication List     TAKE these medications    amLODipine 2.5 MG tablet Commonly known as: NORVASC Take 2.5 mg by mouth daily.   aspirin EC 81 MG tablet Take 1 tablet (81 mg total) by mouth daily.   atorvastatin 80 MG tablet Commonly known as: LIPITOR Take 80 mg by mouth daily.   buPROPion 150 MG 24 hr tablet Commonly known as: WELLBUTRIN XL Take 150 mg by mouth daily.  calcium acetate 667 MG capsule Commonly known as: PHOSLO Take 667 mg by mouth 3 (three) times daily.   FLUoxetine 20 MG capsule Commonly known as: PROZAC Take 20 mg by mouth daily.   Lantus SoloStar 100 UNIT/ML Solostar Pen Generic drug: insulin glargine Inject 16 Units into the skin at bedtime. What changed:  how much to take when to take this   latanoprost 0.005 % ophthalmic solution Commonly known as: XALATAN Place 1 drop  into both eyes at bedtime.   melatonin 3 MG Tabs tablet Take 6 mg by mouth at bedtime.   metoCLOPramide 5 MG tablet Commonly known as: Reglan Take 1 tablet (5 mg total) by mouth 3 (three) times daily.   metoprolol succinate 25 MG 24 hr tablet Commonly known as: TOPROL-XL Take 1 tablet (25 mg total) by mouth daily.   midodrine 2.5 MG tablet Commonly known as: PROAMATINE Take 2.5 mg by mouth as directed. On Dialysis days Monday, Wednesday and Friday   multivitamin Tabs tablet Take 1 tablet by mouth at bedtime.   NovoLOG FlexPen 100 UNIT/ML FlexPen Generic drug: insulin aspart Inject 7 Units into the skin 3 (three) times daily with meals. What changed: how much to take   pantoprazole 40 MG tablet Commonly known as: Protonix Take 1 tablet (40 mg total) by mouth daily.   promethazine 12.5 MG tablet Commonly known as: PHENERGAN Take 1 tablet (12.5 mg total) by mouth every 6 (six) hours as needed for nausea or vomiting.        Follow-up [place] [place] Follow up in 5 day(s).   Contact information: [place] [address] [phone]                Discharge Exam: Filed Weights   12/06/22 1315 12/08/22 0844 12/08/22 1300  Weight: 102.1 kg 97.8 kg 97.6 kg   Physical Exam HENT:     Head: Normocephalic.     Mouth/Throat:     Pharynx: No oropharyngeal exudate.  Eyes:     General: Lids are normal.     Conjunctiva/sclera: Conjunctivae normal.  Cardiovascular:     Rate and Rhythm: Normal rate and regular rhythm.     Heart sounds: Normal heart sounds, S1 normal and S2 normal.  Pulmonary:     Breath sounds: No decreased breath sounds, wheezing, rhonchi or rales.  Abdominal:     Palpations: Abdomen is soft.     Tenderness: There is no abdominal tenderness.  Musculoskeletal:     Right lower leg: No swelling.     Left lower leg: No swelling.  Skin:    General: Skin is warm.     Findings: No rash.   Neurological:     Mental Status: He is alert and oriented to person, place, and time.      Condition at discharge: stable  The results of significant diagnostics from this hospitalization (including imaging, microbiology, ancillary and laboratory) are listed below for reference.   Imaging Studies: ECHOCARDIOGRAM COMPLETE  Result Date: 12/07/2022    ECHOCARDIOGRAM REPORT   Patient Name:   KRIS NO Date of Exam: 12/07/2022 Medical Rec #:  [id number]  Height:       69.0 in Accession #:    [id number] Weight:       225.1 lb Date of Birth:  05/28/1961 BSA:          2.172 m Patient Age:    34 years   BP:  179/105 mmHg Patient Gender: M          HR:           88 bpm. Exam Location:  [facility] Procedure: 2D Echo and Intracardiac Opacification Agent Indications:     Stroke I63.9  History:         Patient has prior history of Echocardiogram examinations, most                  recent 02/16/2021.  Sonographer:     Kathlen Brunswick RDCS Referring Phys:  [place] Diagnosing Phys: Neoma Laming  Sonographer Comments: Suboptimal apical window. Image acquisition challenging due to respiratory motion. IMPRESSIONS  1. Left ventricular ejection fraction, by estimation, is 55 to 60%. The left ventricle has normal function. The left ventricle has no regional wall motion abnormalities. Left ventricular diastolic parameters are consistent with Grade I diastolic dysfunction (impaired relaxation).  2. Right ventricular systolic function is normal. The right ventricular size is normal.  3. Left atrial size was mildly dilated.  4. Right atrial size was mildly dilated.  5. The mitral valve is normal in structure. Trivial mitral valve regurgitation. No evidence of mitral stenosis.  6. The aortic valve is normal in structure. Aortic valve regurgitation is not visualized. No aortic stenosis is present.  7. The inferior vena cava is normal in size with greater than 50% respiratory variability, suggesting right atrial  pressure of 3 mmHg. FINDINGS  Left Ventricle: Left ventricular ejection fraction, by estimation, is 55 to 60%. The left ventricle has normal function. The left ventricle has no regional wall motion abnormalities. Definity contrast agent was given IV to delineate the left ventricular  endocardial borders. The left ventricular internal cavity size was normal in size. There is no left ventricular hypertrophy. Left ventricular diastolic parameters are consistent with Grade I diastolic dysfunction (impaired relaxation). Right Ventricle: The right ventricular size is normal. No increase in right ventricular wall thickness. Right ventricular systolic function is normal. Left Atrium: Left atrial size was mildly dilated. Right Atrium: Right atrial size was mildly dilated. Pericardium: There is no evidence of pericardial effusion. Mitral Valve: The mitral valve is normal in structure. Trivial mitral valve regurgitation. No evidence of mitral valve stenosis. Tricuspid Valve: The tricuspid valve is normal in structure. Tricuspid valve regurgitation is trivial. No evidence of tricuspid stenosis. Aortic Valve: The aortic valve is normal in structure. Aortic valve regurgitation is not visualized. No aortic stenosis is present. Aortic valve peak gradient measures 6.2 mmHg. Pulmonic Valve: The pulmonic valve was normal in structure. Pulmonic valve regurgitation is not visualized. No evidence of pulmonic stenosis. Aorta: The aortic root is normal in size and structure. Venous: The inferior vena cava is normal in size with greater than 50% respiratory variability, suggesting right atrial pressure of 3 mmHg. IAS/Shunts: No atrial level shunt detected by color flow Doppler.  LEFT VENTRICLE PLAX 2D LVIDd:         4.40 cm     Diastology LVIDs:         2.97 cm     LV e' medial:    7.07 cm/s LV PW:         1.25 cm     LV E/e' medial:  8.5 LV IVS:        1.10 cm     LV e' lateral:   10.70 cm/s LVOT diam:     2.10 cm     LV E/e' lateral: 5.6  LV SV:  65 LV SV Index:   30 LVOT Area:     3.46 cm  LV Volumes (MOD) LV vol d, MOD A2C: 83.3 ml LV vol d, MOD A4C: 90.2 ml LV vol s, MOD A2C: 23.6 ml LV vol s, MOD A4C: 33.8 ml LV SV MOD A2C:     59.7 ml LV SV MOD A4C:     90.2 ml LV SV MOD BP:      60.1 ml RIGHT VENTRICLE RV Basal diam:  2.40 cm RV S prime:     15.70 cm/s LEFT ATRIUM             Index        RIGHT ATRIUM           Index LA diam:        4.00 cm 1.84 cm/m   RA Area:     10.10 cm LA Vol (A2C):   36.7 ml 16.89 ml/m  RA Volume:   20.20 ml  9.30 ml/m LA Vol (A4C):   39.6 ml 18.23 ml/m LA Biplane Vol: 38.8 ml 17.86 ml/m  AORTIC VALVE                 PULMONIC VALVE AV Area (Vmax): 2.34 cm     PV Vmax:       0.83 m/s AV Vmax:        125.00 cm/s  PV Peak grad:  2.8 mmHg AV Peak Grad:   6.2 mmHg LVOT Vmax:      84.60 cm/s LVOT Vmean:     53.200 cm/s LVOT VTI:       0.188 m  AORTA Ao Root diam: 3.30 cm Ao Asc diam:  2.60 cm MITRAL VALVE MV Area (PHT): 5.27 cm    SHUNTS MV Decel Time: 144 msec    Systemic VTI:  0.19 m MV E velocity: 60.30 cm/s  Systemic Diam: 2.10 cm MV A velocity: 94.90 cm/s MV E/A ratio:  0.64 Shaukat Khan Electronically signed by Neoma Laming Signature Date/Time: 12/07/2022/5:31:29 PM    Final    US Carotid Bilateral (at [facility] and [facility] only)  Result Date: 12/07/2022 CLINICAL DATA:  CVA EXAM: BILATERAL CAROTID DUPLEX ULTRASOUND TECHNIQUE: Pearline Cables scale imaging, color Doppler and duplex ultrasound were performed of bilateral carotid and vertebral arteries in the neck. COMPARISON:  None Available. FINDINGS: Criteria: Quantification of carotid stenosis is based on velocity parameters that correlate the residual internal carotid diameter with NASCET-based stenosis levels, using the diameter of the distal internal carotid lumen as the denominator for stenosis measurement. The following velocity measurements were obtained: RIGHT ICA: 63/18 cm/sec CCA: 16/10 cm/sec SYSTOLIC ICA/CCA RATIO: 0.7 ECA: 115 cm/sec LEFT ICA: 77/27 cm/sec CCA:  960/45 cm/sec SYSTOLIC ICA/CCA RATIO: 0.6 ECA: 100 cm/sec RIGHT CAROTID ARTERY: Mild atheromatous plaque at the bifurcation. RIGHT VERTEBRAL ARTERY:  Antegrade with normal waveform LEFT CAROTID ARTERY:  Mild atheromatous plaque at the bifurcation LEFT VERTEBRAL ARTERY:  Antegrade with normal waveform IMPRESSION: 1. Mild atheromatous plaque of the cervical carotids with no evidence of significant stenosis. 2. Normal vertebral artery waveforms. Electronically Signed   By: Jorje Guild M.D.   On: 12/07/2022 10:03   MR BRAIN WO CONTRAST  Result Date: 12/07/2022 CLINICAL DATA:  Neuro deficit with acute stroke suspected EXAM: MRI HEAD WITHOUT CONTRAST TECHNIQUE: Multiplanar, multiecho pulse sequences of the brain and surrounding structures were obtained without intravenous contrast. COMPARISON:  Head CT from yesterday FINDINGS: Brain: No acute infarction, hemorrhage, hydrocephalus, extra-axial collection or mass lesion. Volume  loss and lacunar/gliotic changes in the brainstem attributed to chronic infarcts with chronic blood products and emanating wallerian changes. Findings more likely post ischemic than from neuro degenerative disorder given prior imaging. Chronic lacune also seen at the lower left thalamus. Milder chronic small vessel ischemia in the cerebral white matter. Distinct remote white matter insult with wallerian changes crossing the genu of the corpus callosum. Vascular: Major flow voids are preserved Skull and upper cervical spine: No focal marrow lesion Sinuses/Orbits: Bilateral cataract resection IMPRESSION: 1. No acute finding. 2. 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 Electronically Signed   By: Jorje Guild M.D.   On: 12/07/2022 06:08   CT Head Wo Contrast  Result Date: 12/06/2022 CLINICAL DATA:  Stroke suspected, headache. EXAM: CT HEAD WITHOUT CONTRAST TECHNIQUE: Contiguous axial images were obtained from the base of the skull through the vertex without  intravenous contrast. RADIATION DOSE REDUCTION: This exam was performed according to the departmental dose-optimization program which includes automated exposure control, adjustment of the mA and/or kV according to patient size and/or use of iterative reconstruction technique. COMPARISON:  CT head 02/15/2021 FINDINGS: Brain: No evidence of acute infarction, hemorrhage, hydrocephalus, extra-axial collection or mass lesion/mass effect. Vascular: Atherosclerotic calcifications are present within the cavernous internal carotid arteries. Skull: Normal. Negative for fracture or focal lesion. Sinuses/Orbits: No acute finding. Other: Right occipital scalp scarring is again noted. IMPRESSION: No acute intracranial process. Electronically Signed   By: Ronney Asters M.D.   On: 12/06/2022 16:58    Microbiology: Results for orders placed or performed during the hospital encounter of 12/06/22  Resp panel by RT-PCR (RSV, Flu A&B, Covid) Anterior Nasal Swab     Status: None   Collection Time: 12/06/22  1:32 PM   Specimen: Anterior Nasal Swab  Result Value Ref Range Status   SARS Coronavirus 2 by RT PCR NEGATIVE NEGATIVE Final    Comment: (NOTE) SARS-CoV-2 target nucleic acids are NOT DETECTED.  The SARS-CoV-2 RNA is generally detectable in upper respiratory specimens during the acute phase of infection. The lowest concentration of SARS-CoV-2 viral copies this assay can detect is 138 copies/mL. A negative result does not preclude SARS-Cov-2 infection and should not be used as the sole basis for treatment or other patient management decisions. A negative result may occur with  improper specimen collection/handling, submission of specimen other than nasopharyngeal swab, presence of viral mutation(s) within the areas targeted by this assay, and inadequate number of viral copies(<138 copies/mL). A negative result must be combined with clinical observations, patient history, and epidemiological information. The  expected result is Negative.  Fact Sheet for Patients:  EntrepreneurPulse.com.au  Fact Sheet for Healthcare Providers:  IncredibleEmployment.be  This test is no t yet approved or cleared by the [place] FDA and  has been authorized for detection and/or diagnosis of SARS-CoV-2 by FDA under an Emergency Use Authorization (EUA). This EUA will remain  in effect (meaning this test can be used) for the duration of the COVID-19 declaration under Section 564(b)(1) of the Act, 21 U.S.C.section 360bbb-3(b)(1), unless the authorization is terminated  or revoked sooner.       Influenza A by PCR NEGATIVE NEGATIVE Final   Influenza B by PCR NEGATIVE NEGATIVE Final    Comment: (NOTE) The Xpert Xpress SARS-CoV-2/FLU/RSV plus assay is intended as an aid in the diagnosis of influenza from Nasopharyngeal swab specimens and should not be used as a sole basis for treatment. Nasal washings and aspirates are unacceptable for Xpert Xpress  SARS-CoV-2/FLU/RSV testing.  Fact Sheet for Patients: EntrepreneurPulse.com.au  Fact Sheet for Healthcare Providers: IncredibleEmployment.be  This test is not yet approved or cleared by the [place] FDA and has been authorized for detection and/or diagnosis of SARS-CoV-2 by FDA under an Emergency Use Authorization (EUA). This EUA will remain in effect (meaning this test can be used) for the duration of the COVID-19 declaration under Section 564(b)(1) of the Act, 21 U.S.C. section 360bbb-3(b)(1), unless the authorization is terminated or revoked.     Resp Syncytial Virus by PCR NEGATIVE NEGATIVE Final    Comment: (NOTE) Fact Sheet for Patients: EntrepreneurPulse.com.au  Fact Sheet for Healthcare Providers: IncredibleEmployment.be  This test is not yet approved or cleared by the [place] FDA and has been authorized for detection and/or  diagnosis of SARS-CoV-2 by FDA under an Emergency Use Authorization (EUA). This EUA will remain in effect (meaning this test can be used) for the duration of the COVID-19 declaration under Section 564(b)(1) of the Act, 21 U.S.C. section 360bbb-3(b)(1), unless the authorization is terminated or revoked.  Performed at [facility], [place], [place] [address]   C Difficile Quick Screen w PCR reflex     Status: None   Collection Time: 12/08/22  2:54 AM   Specimen: STOOL  Result Value Ref Range Status   C Diff antigen NEGATIVE NEGATIVE Final   C Diff toxin NEGATIVE NEGATIVE Final   C Diff interpretation No C. difficile detected.  Final    Comment: VALID Performed at [facility], [address] [place], [place] [address]   Gastrointestinal Panel by PCR , Stool     Status: None   Collection Time: 12/08/22  2:54 AM   Specimen: Stool  Result Value Ref Range Status   Campylobacter species NOT DETECTED NOT DETECTED Final   Plesimonas shigelloides NOT DETECTED NOT DETECTED Final   Salmonella species NOT DETECTED NOT DETECTED Final   Yersinia enterocolitica NOT DETECTED NOT DETECTED Final   Vibrio species NOT DETECTED NOT DETECTED Final   Vibrio cholerae NOT DETECTED NOT DETECTED Final   Enteroaggregative E coli (EAEC) NOT DETECTED NOT DETECTED Final   Enteropathogenic E coli (EPEC) NOT DETECTED NOT DETECTED Final   Enterotoxigenic E coli (ETEC) NOT DETECTED NOT DETECTED Final   Shiga like toxin producing E coli (STEC) NOT DETECTED NOT DETECTED Final   Shigella/Enteroinvasive E coli (EIEC) NOT DETECTED NOT DETECTED Final   Cryptosporidium NOT DETECTED NOT DETECTED Final   Cyclospora cayetanensis NOT DETECTED NOT DETECTED Final   Entamoeba histolytica NOT DETECTED NOT DETECTED Final   Giardia lamblia NOT DETECTED NOT DETECTED Final   Adenovirus F40/41 NOT DETECTED NOT DETECTED Final   Astrovirus NOT DETECTED NOT DETECTED Final   Norovirus GI/GII NOT  DETECTED NOT DETECTED Final   Rotavirus A NOT DETECTED NOT DETECTED Final   Sapovirus (I, II, IV, and V) NOT DETECTED NOT DETECTED Final    Comment: Performed at [facility], [address] [place],  [address]    Labs: CBC: Recent Labs  Lab 12/06/22 1332 12/07/22 0624 12/08/22 0254  WBC 9.6 10.7* 10.0  HGB 13.8 13.4 13.2  HCT 43.5 42.6 42.5  MCV 81.9 83.5 83.0  PLT 278 245 557   Basic Metabolic Panel: Recent Labs  Lab 12/06/22 1332 12/07/22 0624 12/08/22 0254  NA 138  --  139  K 4.7  --  3.4*  CL 94*  --  97*  CO2 27  --  29  GLUCOSE  260*  --  176*  BUN 31*  --  50*  CREATININE 5.00* 6.15* 6.33*  CALCIUM 8.9  --  9.0  MG 2.1  --   --   PHOS 4.2  --  4.8*   Liver Function Tests: Recent Labs  Lab 12/06/22 1332 12/08/22 0254  AST 44*  --   ALT 23  --   ALKPHOS 104  --   BILITOT 1.6*  --   PROT 8.2*  --   ALBUMIN 4.0 3.8   CBG: Recent Labs  Lab 12/07/22 0819 12/07/22 1244 12/07/22 1658 12/07/22 2149 12/08/22 1436  GLUCAP 188* 206* 213* 157* 151*    Discharge time spent: greater than 30 minutes.  Signed: Loletha Grayer, MD [facility] 12/08/2022

## 2022-12-09 LAB — HEPATITIS B SURFACE ANTIBODY, QUANTITATIVE: Hep B S AB Quant (Post): 641.3 m[IU]/mL (ref >9.9)

## 2023-04-01 ENCOUNTER — Emergency Department
Admission: EM | Admit: 2023-04-01 | Discharge: 2023-04-01 | Disposition: A | Discharge status: Home / Self Care | Payer: 59 | Attending: Emergency Medicine | Admitting: Emergency Medicine

## 2023-04-01 ENCOUNTER — Other Ambulatory Visit: Payer: Self-pay

## 2023-04-01 ENCOUNTER — Encounter: Payer: Self-pay | Admitting: Emergency Medicine

## 2023-04-01 ENCOUNTER — Emergency Department: Payer: 59

## 2023-04-01 DIAGNOSIS — R0789 Other chest pain: Secondary | ICD-10-CM | POA: Insufficient documentation

## 2023-04-01 DIAGNOSIS — I132 Hypertensive heart and chronic kidney disease with heart failure and with stage 5 chronic kidney disease, or end stage renal disease: Secondary | ICD-10-CM | POA: Diagnosis not present

## 2023-04-01 DIAGNOSIS — E1122 Type 2 diabetes mellitus with diabetic chronic kidney disease: Secondary | ICD-10-CM | POA: Diagnosis not present

## 2023-04-01 DIAGNOSIS — T82838A Hemorrhage of vascular prosthetic devices, implants and grafts, initial encounter: Secondary | ICD-10-CM | POA: Diagnosis present

## 2023-04-01 DIAGNOSIS — N186 End stage renal disease: Secondary | ICD-10-CM | POA: Insufficient documentation

## 2023-04-01 DIAGNOSIS — I509 Heart failure, unspecified: Secondary | ICD-10-CM | POA: Insufficient documentation

## 2023-04-01 DIAGNOSIS — D649 Anemia, unspecified: Secondary | ICD-10-CM | POA: Diagnosis not present

## 2023-04-01 DIAGNOSIS — R58 Hemorrhage, not elsewhere classified: Secondary | ICD-10-CM

## 2023-04-01 DIAGNOSIS — Z992 Dependence on renal dialysis: Secondary | ICD-10-CM | POA: Diagnosis not present

## 2023-04-01 LAB — BASIC METABOLIC PANEL
Anion gap: 14 (ref 5–15)
BUN: 44 mg/dL — ABNORMAL HIGH (ref 8–23)
CO2: 27 mmol/L (ref 22–32)
Calcium: 8.9 mg/dL (ref 8.9–10.3)
Chloride: 95 mmol/L — ABNORMAL LOW (ref 98–111)
Creatinine, Ser: 5.81 mg/dL — ABNORMAL HIGH (ref 0.61–1.24)
GFR, Estimated: 10 mL/min — ABNORMAL LOW (ref >60)
Glucose, Bld: 174 mg/dL — ABNORMAL HIGH (ref 70–99)
Potassium: 3.9 mmol/L (ref 3.5–5.1)
Sodium: 136 mmol/L (ref 135–145)

## 2023-04-01 LAB — CBC WITH DIFFERENTIAL/PLATELET
Abs Immature Granulocytes: 0.04 10*3/uL (ref 0.00–0.07)
Basophils Absolute: 0 10*3/uL (ref 0.0–0.1)
Basophils Relative: 0 %
Eosinophils Absolute: 0.1 10*3/uL (ref 0.0–0.5)
Eosinophils Relative: 1 %
HCT: 37.9 % — ABNORMAL LOW (ref 39.0–52.0)
Hemoglobin: 12.1 g/dL — ABNORMAL LOW (ref 13.0–17.0)
Immature Granulocytes: 1 %
Lymphocytes Relative: 20 %
Lymphs Abs: 1.6 10*3/uL (ref 0.7–4.0)
MCH: 26.2 pg (ref 26.0–34.0)
MCHC: 31.9 g/dL (ref 30.0–36.0)
MCV: 82 fL (ref 80.0–100.0)
Monocytes Absolute: 0.8 10*3/uL (ref 0.1–1.0)
Monocytes Relative: 9 %
Neutro Abs: 5.6 10*3/uL (ref 1.7–7.7)
Neutrophils Relative %: 69 %
Platelets: 246 10*3/uL (ref 150–400)
RBC: 4.62 MIL/uL (ref 4.22–5.81)
RDW: 15.6 % — ABNORMAL HIGH (ref 11.5–15.5)
WBC: 8.1 10*3/uL (ref 4.0–10.5)
nRBC: 0 % (ref 0.0–0.2)

## 2023-04-01 LAB — CBC
HCT: 39.6 % (ref 39.0–52.0)
Hemoglobin: 12.7 g/dL — ABNORMAL LOW (ref 13.0–17.0)
MCH: 26.3 pg (ref 26.0–34.0)
MCHC: 32.1 g/dL (ref 30.0–36.0)
MCV: 82 fL (ref 80.0–100.0)
Platelets: 262 10*3/uL (ref 150–400)
RBC: 4.83 MIL/uL (ref 4.22–5.81)
RDW: 15.6 % — ABNORMAL HIGH (ref 11.5–15.5)
WBC: 10 10*3/uL (ref 4.0–10.5)
nRBC: 0 % (ref 0.0–0.2)

## 2023-04-01 LAB — TROPONIN I (HIGH SENSITIVITY)
Troponin I (High Sensitivity): 42 ng/L — ABNORMAL HIGH (ref <18)
Troponin I (High Sensitivity): 37 ng/L — ABNORMAL HIGH (ref <18)

## 2023-04-01 NOTE — Discharge Instructions (Signed)
Return to the ER for new, worsening, or persistent severe chest discomfort or pain, difficulty breathing, weakness or lightheadedness, abnormal bleeding, or any other new or worsening symptoms that concern you.

## 2023-04-01 NOTE — ED Provider Notes (Signed)
[facility] Provider Note    Event Date/Time   First MD Initiated Contact with Patient 04/01/23 1500     (approximate)   History   Chest Pain   HPI  Bobby Sampson is a 62 y.o. male with a history of ESRD on dialysis, hypertension, CHF, and diabetes who presents with an episode of chest discomfort in the context of acute blood loss.  The patient states that at the end of his dialysis session when the needle was taken out he started bleeding from the right arm.  Per EMS, the facility noted that he had lost approximately 800 mL of blood although the patient feels that it was less than this.  At the time of the blood loss he started having vague discomfort in his chest although denies any actual chest pain.  He has no shortness of breath or lightheadedness.  The discomfort resolved as soon as the bleeding was under control.  He has not had any other abnormal bleeding.  I reviewed the past medical records.  The patient was most recently admitted in January.  Per the hospitalist discharge summary from 1/8 he presente with persistent nausea, vomiting, and diarrhea at that time.   Physical Exam   Triage Vital Signs: ED Triage Vitals  Enc Vitals Group     BP 04/01/23 1417 109/78     Pulse Rate 04/01/23 1417 83     Resp 04/01/23 1417 18     Temp 04/01/23 1417 97.6 F (36.4 C)     Temp Source 04/01/23 1417 Oral     SpO2 04/01/23 1417 100 %     Weight --      Height --      Head Circumference --      Peak Flow --      Pain Score 04/01/23 1416 0     Pain Loc --      Pain Edu? --      Excl. in GC? --     Most recent vital signs: Vitals:   04/01/23 1417  BP: 109/78  Pulse: 83  Resp: 18  Temp: 97.6 F (36.4 C)  SpO2: 100%     General: Awake, no distress.  CV:  Good peripheral perfusion.  Resp:  Normal effort.  Abd:  No distention.  Other:  No significant peripheral edema.  Right arm fistula with no active bleeding.   ED Results / Procedures /  Treatments   Labs (all labs ordered are listed, but only abnormal results are displayed) Labs Reviewed  BASIC METABOLIC PANEL - Abnormal; Notable for the following components:      Result Value   Chloride 95 (*)    Glucose, Bld 174 (*)    BUN 44 (*)    Creatinine, Ser 5.81 (*)    GFR, Estimated 10 (*)    All other components within normal limits  CBC - Abnormal; Notable for the following components:   Hemoglobin 12.7 (*)    RDW 15.6 (*)    All other components within normal limits  CBC WITH DIFFERENTIAL/PLATELET - Abnormal; Notable for the following components:   Hemoglobin 12.1 (*)    HCT 37.9 (*)    RDW 15.6 (*)    All other components within normal limits  TROPONIN I (HIGH SENSITIVITY) - Abnormal; Notable for the following components:   Troponin I (High Sensitivity) 42 (*)    All other components within normal limits  TROPONIN I (HIGH SENSITIVITY) - Abnormal; Notable for the  following components:   Troponin I (High Sensitivity) 37 (*)    All other components within normal limits     EKG  ED ECG REPORT I, Dionne Bucy, the attending physician, personally viewed and interpreted this ECG.  Date: 04/01/2023 EKG Time: 1417 Rate: 84 Rhythm: normal sinus rhythm QRS Axis: normal Intervals: normal ST/T Wave abnormalities: Nonspecific T wave abnormalities Narrative Interpretation: Nonspecific abnormalities with no evidence of acute ischemia    RADIOLOGY    PROCEDURES:  Critical Care performed: No  Procedures   MEDICATIONS ORDERED IN ED: Medications - No data to display   IMPRESSION / MDM / ASSESSMENT AND PLAN / ED COURSE  I reviewed the triage vital signs and the nursing notes.  62 year old male with PMH as noted above presents after an episode of transient chest discomfort occurring in the context of acute blood loss at the end of his dialysis.  He has no further bleeding and states that the chest discomfort resolved almost immediately.  He is  currently asymptomatic, states he feels well, and would like to go home.  On exam his vital signs are normal and the physical exam is otherwise unremarkable.  EKG shows no specific ischemic findings.  Differential diagnosis includes, but is not limited to, near syncope, anxiety, or other benign etiology, versus less likely demand ischemia due to acute blood loss, ACS, or other cardiac etiology.  Initial labs reveal a hemoglobin of 12.7 and a troponin of 42 which is consistent with the patient's baseline.  We will obtain a repeat of both labs after 2 hours and reassess.  Patient's presentation is most consistent with acute presentation with potential threat to life or bodily function.  The patient is on the cardiac monitor to evaluate for evidence of arrhythmia and/or significant heart rate changes.   ----------------------------------------- 5:48 PM on 04/01/2023 -----------------------------------------  Repeat troponin and hemoglobin are both stable.  The patient remains asymptomatic.  He is stable for discharge home at this time.  I counseled him on the results of the workup and on return precautions; he expressed understanding.  FINAL CLINICAL IMPRESSION(S) / ED DIAGNOSES   Final diagnoses:  Chest discomfort  Bleeding     Rx / DC Orders   ED Discharge Orders     None        Note:  This document was prepared using Dragon voice recognition software and may include unintentional dictation errors.    Dionne Bucy, MD 04/01/23 [phone]

## 2023-04-01 NOTE — ED Triage Notes (Signed)
Patient to ED via [facility] from dialysis after losing approx of blood. Patient had approx 10 mins of treatment left when the needle came out and had blood loss. Patient started having CP after this. NAD noted at this time. Bleeding controlled at this time. Denies pain at this time.

## 2023-04-01 NOTE — ED Notes (Addendum)
Pt presents to ED from dialysis with minimal CP but presented ED for profuse bleeding from R arm fistula during dialysis, bleeding controlled at this time. NAD noted. Pt is A&Ox4. Pt states he did complete all of TX other than 10 minutes.   Pt denies any SOB or other complaints at this time.

## 2023-11-15 ENCOUNTER — Other Ambulatory Visit: Payer: Self-pay

## 2023-11-15 ENCOUNTER — Emergency Department (HOSPITAL_COMMUNITY): Payer: 59

## 2023-11-15 ENCOUNTER — Observation Stay (HOSPITAL_COMMUNITY): Payer: 59

## 2023-11-15 ENCOUNTER — Encounter (HOSPITAL_COMMUNITY): Payer: Self-pay | Admitting: Infectious Diseases

## 2023-11-15 ENCOUNTER — Inpatient Hospital Stay (HOSPITAL_COMMUNITY)
Admission: EM | Admit: 2023-11-15 | Discharge: 2023-11-17 | DRG: 064 | Disposition: A | Discharge status: Rehabilitation Facility | Payer: 59 | Attending: Infectious Diseases | Admitting: Infectious Diseases

## 2023-11-15 DIAGNOSIS — I5032 Chronic diastolic (congestive) heart failure: Secondary | ICD-10-CM | POA: Diagnosis present

## 2023-11-15 DIAGNOSIS — Z79899 Other long term (current) drug therapy: Secondary | ICD-10-CM

## 2023-11-15 DIAGNOSIS — N2581 Secondary hyperparathyroidism of renal origin: Secondary | ICD-10-CM | POA: Diagnosis present

## 2023-11-15 DIAGNOSIS — E1142 Type 2 diabetes mellitus with diabetic polyneuropathy: Secondary | ICD-10-CM | POA: Diagnosis present

## 2023-11-15 DIAGNOSIS — I6329 Cerebral infarction due to unspecified occlusion or stenosis of other precerebral arteries: Principal | ICD-10-CM | POA: Diagnosis present

## 2023-11-15 DIAGNOSIS — Z8673 Personal history of transient ischemic attack (TIA), and cerebral infarction without residual deficits: Secondary | ICD-10-CM

## 2023-11-15 DIAGNOSIS — R29704 NIHSS score 4: Secondary | ICD-10-CM | POA: Diagnosis present

## 2023-11-15 DIAGNOSIS — G8194 Hemiplegia, unspecified affecting left nondominant side: Secondary | ICD-10-CM | POA: Diagnosis present

## 2023-11-15 DIAGNOSIS — E1151 Type 2 diabetes mellitus with diabetic peripheral angiopathy without gangrene: Secondary | ICD-10-CM | POA: Diagnosis present

## 2023-11-15 DIAGNOSIS — Z992 Dependence on renal dialysis: Secondary | ICD-10-CM

## 2023-11-15 DIAGNOSIS — E1122 Type 2 diabetes mellitus with diabetic chronic kidney disease: Secondary | ICD-10-CM | POA: Diagnosis present

## 2023-11-15 DIAGNOSIS — R2990 Unspecified symptoms and signs involving the nervous system: Secondary | ICD-10-CM | POA: Diagnosis not present

## 2023-11-15 DIAGNOSIS — Z7902 Long term (current) use of antithrombotics/antiplatelets: Secondary | ICD-10-CM

## 2023-11-15 DIAGNOSIS — H409 Unspecified glaucoma: Secondary | ICD-10-CM | POA: Diagnosis present

## 2023-11-15 DIAGNOSIS — F32A Depression, unspecified: Secondary | ICD-10-CM | POA: Diagnosis present

## 2023-11-15 DIAGNOSIS — R29810 Facial weakness: Secondary | ICD-10-CM | POA: Diagnosis present

## 2023-11-15 DIAGNOSIS — Z7982 Long term (current) use of aspirin: Secondary | ICD-10-CM

## 2023-11-15 DIAGNOSIS — E669 Obesity, unspecified: Secondary | ICD-10-CM | POA: Diagnosis present

## 2023-11-15 DIAGNOSIS — R531 Weakness: Principal | ICD-10-CM

## 2023-11-15 DIAGNOSIS — Z794 Long term (current) use of insulin: Secondary | ICD-10-CM

## 2023-11-15 DIAGNOSIS — Z89412 Acquired absence of left great toe: Secondary | ICD-10-CM

## 2023-11-15 DIAGNOSIS — R270 Ataxia, unspecified: Secondary | ICD-10-CM | POA: Diagnosis present

## 2023-11-15 DIAGNOSIS — Z6834 Body mass index (BMI) 34.0-34.9, adult: Secondary | ICD-10-CM

## 2023-11-15 DIAGNOSIS — Z841 Family history of disorders of kidney and ureter: Secondary | ICD-10-CM

## 2023-11-15 DIAGNOSIS — Z888 Allergy status to other drugs, medicaments and biological substances status: Secondary | ICD-10-CM

## 2023-11-15 DIAGNOSIS — E113599 Type 2 diabetes mellitus with proliferative diabetic retinopathy without macular edema, unspecified eye: Secondary | ICD-10-CM | POA: Diagnosis present

## 2023-11-15 DIAGNOSIS — E785 Hyperlipidemia, unspecified: Secondary | ICD-10-CM | POA: Diagnosis present

## 2023-11-15 DIAGNOSIS — R278 Other lack of coordination: Secondary | ICD-10-CM | POA: Diagnosis present

## 2023-11-15 DIAGNOSIS — G4733 Obstructive sleep apnea (adult) (pediatric): Secondary | ICD-10-CM | POA: Diagnosis present

## 2023-11-15 DIAGNOSIS — Z8249 Family history of ischemic heart disease and other diseases of the circulatory system: Secondary | ICD-10-CM

## 2023-11-15 DIAGNOSIS — M898X9 Other specified disorders of bone, unspecified site: Secondary | ICD-10-CM | POA: Diagnosis present

## 2023-11-15 DIAGNOSIS — Z89422 Acquired absence of other left toe(s): Secondary | ICD-10-CM

## 2023-11-15 DIAGNOSIS — Z833 Family history of diabetes mellitus: Secondary | ICD-10-CM

## 2023-11-15 DIAGNOSIS — D631 Anemia in chronic kidney disease: Secondary | ICD-10-CM | POA: Diagnosis present

## 2023-11-15 DIAGNOSIS — I132 Hypertensive heart and chronic kidney disease with heart failure and with stage 5 chronic kidney disease, or end stage renal disease: Secondary | ICD-10-CM | POA: Diagnosis present

## 2023-11-15 DIAGNOSIS — E119 Type 2 diabetes mellitus without complications: Secondary | ICD-10-CM

## 2023-11-15 DIAGNOSIS — N186 End stage renal disease: Secondary | ICD-10-CM | POA: Diagnosis present

## 2023-11-15 LAB — COMPREHENSIVE METABOLIC PANEL
ALT: 15 U/L (ref 0–44)
AST: 16 U/L (ref 15–41)
Albumin: 3 g/dL — ABNORMAL LOW (ref 3.5–5.0)
Alkaline Phosphatase: 101 U/L (ref 38–126)
Anion gap: 14 (ref 5–15)
BUN: 40 mg/dL — ABNORMAL HIGH (ref 8–23)
CO2: 27 mmol/L (ref 22–32)
Calcium: 8.6 mg/dL — ABNORMAL LOW (ref 8.9–10.3)
Chloride: 94 mmol/L — ABNORMAL LOW (ref 98–111)
Creatinine, Ser: 8.38 mg/dL — ABNORMAL HIGH (ref 0.61–1.24)
GFR, Estimated: 7 mL/min — ABNORMAL LOW (ref >60)
Glucose, Bld: 155 mg/dL — ABNORMAL HIGH (ref 70–99)
Potassium: 4.2 mmol/L (ref 3.5–5.1)
Sodium: 135 mmol/L (ref 135–145)
Total Bilirubin: 0.6 mg/dL (ref <1.2)
Total Protein: 6 g/dL — ABNORMAL LOW (ref 6.5–8.1)

## 2023-11-15 LAB — I-STAT CHEM 8, ED
BUN: 37 mg/dL — ABNORMAL HIGH (ref 8–23)
Calcium, Ion: 0.99 mmol/L — ABNORMAL LOW (ref 1.15–1.40)
Chloride: 94 mmol/L — ABNORMAL LOW (ref 98–111)
Creatinine, Ser: 8.6 mg/dL — ABNORMAL HIGH (ref 0.61–1.24)
Glucose, Bld: 148 mg/dL — ABNORMAL HIGH (ref 70–99)
HCT: 35 % — ABNORMAL LOW (ref 39.0–52.0)
Hemoglobin: 11.9 g/dL — ABNORMAL LOW (ref 13.0–17.0)
Potassium: 4.1 mmol/L (ref 3.5–5.1)
Sodium: 135 mmol/L (ref 135–145)
TCO2: 27 mmol/L (ref 22–32)

## 2023-11-15 LAB — DIFFERENTIAL
Abs Immature Granulocytes: 0.03 10*3/uL (ref 0.00–0.07)
Basophils Absolute: 0.1 10*3/uL (ref 0.0–0.1)
Basophils Relative: 1 %
Eosinophils Absolute: 0.2 10*3/uL (ref 0.0–0.5)
Eosinophils Relative: 2 %
Immature Granulocytes: 0 %
Lymphocytes Relative: 22 %
Lymphs Abs: 1.6 10*3/uL (ref 0.7–4.0)
Monocytes Absolute: 0.7 10*3/uL (ref 0.1–1.0)
Monocytes Relative: 10 %
Neutro Abs: 4.7 10*3/uL (ref 1.7–7.7)
Neutrophils Relative %: 65 %

## 2023-11-15 LAB — GLUCOSE, CAPILLARY
Glucose-Capillary: 221 mg/dL — ABNORMAL HIGH (ref 70–99)
Glucose-Capillary: 210 mg/dL — ABNORMAL HIGH (ref 70–99)

## 2023-11-15 LAB — CBC
HCT: 37.4 % — ABNORMAL LOW (ref 39.0–52.0)
Hemoglobin: 11.1 g/dL — ABNORMAL LOW (ref 13.0–17.0)
MCH: 27.1 pg (ref 26.0–34.0)
MCHC: 29.7 g/dL — ABNORMAL LOW (ref 30.0–36.0)
MCV: 91.2 fL (ref 80.0–100.0)
Platelets: 224 10*3/uL (ref 150–400)
RBC: 4.1 MIL/uL — ABNORMAL LOW (ref 4.22–5.81)
RDW: 16.2 % — ABNORMAL HIGH (ref 11.5–15.5)
WBC: 7.3 10*3/uL (ref 4.0–10.5)
nRBC: 0 % (ref 0.0–0.2)

## 2023-11-15 LAB — APTT: aPTT: 31 s (ref 24–36)

## 2023-11-15 LAB — HEMOGLOBIN A1C
Hgb A1c MFr Bld: 6.5 % — ABNORMAL HIGH (ref 4.8–5.6)
Mean Plasma Glucose: 139.85 mg/dL

## 2023-11-15 LAB — ETHANOL: Alcohol, Ethyl (B): <10 mg/dL (ref <10)

## 2023-11-15 LAB — PROTIME-INR
INR: 1.1 (ref 0.8–1.2)
Prothrombin Time: 14.8 s (ref 11.4–15.2)

## 2023-11-15 MED ORDER — INSULIN GLARGINE-YFGN 100 UNIT/ML ~~LOC~~ SOLN
10.0000 [IU] | Freq: Every morning | SUBCUTANEOUS | Status: DC
  Administered 2023-11-16 (×2): 10 [IU] via SUBCUTANEOUS
  Filled 2023-11-15 (×2): qty 0.1

## 2023-11-15 MED ORDER — INSULIN GLARGINE-YFGN 100 UNIT/ML ~~LOC~~ SOLN
10.0000 [IU] | Freq: Every morning | SUBCUTANEOUS | Status: DC
  Filled 2023-11-15: qty 0.1

## 2023-11-15 MED ORDER — INSULIN ASPART 100 UNIT/ML IJ SOLN
0.0000 [IU] | Freq: Every day | INTRAMUSCULAR | Status: DC
  Administered 2023-11-16 (×2): 2 [IU] via SUBCUTANEOUS

## 2023-11-15 MED ORDER — IOHEXOL 350 MG/ML SOLN
75.0000 mL | Freq: Once | INTRAVENOUS | Status: AC | PRN
  Administered 2023-11-15: 75 mL via INTRAVENOUS

## 2023-11-15 MED ORDER — SENNOSIDES-DOCUSATE SODIUM 8.6-50 MG PO TABS: 1.0000 | ORAL_TABLET | Freq: Every evening | ORAL | Status: DC | PRN

## 2023-11-15 MED ORDER — FLUOXETINE HCL 20 MG PO CAPS
20.0000 mg | ORAL_CAPSULE | Freq: Every day | ORAL | Status: DC
  Administered 2023-11-15 – 2023-11-16 (×2): 20 mg via ORAL
  Filled 2023-11-15 (×2): qty 1

## 2023-11-15 MED ORDER — CHLORHEXIDINE GLUCONATE CLOTH 2 % EX PADS
6.0000 | MEDICATED_PAD | Freq: Every day | CUTANEOUS | Status: DC
  Administered 2023-11-16 – 2023-11-17 (×2): 6 via TOPICAL

## 2023-11-15 MED ORDER — SODIUM CHLORIDE 0.9% FLUSH
3.0000 mL | Freq: Once | INTRAVENOUS | Status: AC
  Administered 2023-11-15: 3 mL via INTRAVENOUS

## 2023-11-15 MED ORDER — CLOPIDOGREL BISULFATE 75 MG PO TABS
225.0000 mg | ORAL_TABLET | Freq: Once | ORAL | Status: AC
  Administered 2023-11-15: 225 mg via ORAL
  Filled 2023-11-15: qty 3

## 2023-11-15 MED ORDER — ASPIRIN 81 MG PO CHEW
81.0000 mg | CHEWABLE_TABLET | Freq: Every day | ORAL | Status: DC
  Administered 2023-11-15 – 2023-11-17 (×3): 81 mg via ORAL
  Filled 2023-11-15 (×3): qty 1

## 2023-11-15 MED ORDER — INSULIN ASPART 100 UNIT/ML IJ SOLN
0.0000 [IU] | Freq: Three times a day (TID) | INTRAMUSCULAR | Status: DC
  Administered 2023-11-16 (×2): 2 [IU] via SUBCUTANEOUS
  Administered 2023-11-17: 1 [IU] via SUBCUTANEOUS

## 2023-11-15 MED ORDER — INSULIN ASPART 100 UNIT/ML IJ SOLN
0.0000 [IU] | Freq: Three times a day (TID) | INTRAMUSCULAR | Status: DC
  Administered 2023-11-15: 5 [IU] via SUBCUTANEOUS

## 2023-11-15 MED ORDER — ASPIRIN 81 MG PO CHEW: 81.0000 mg | CHEWABLE_TABLET | Freq: Once | ORAL | Status: DC

## 2023-11-15 MED ORDER — DORZOLAMIDE HCL-TIMOLOL MAL 2-0.5 % OP SOLN
1.0000 [drp] | Freq: Two times a day (BID) | OPHTHALMIC | Status: DC
  Administered 2023-11-15 – 2023-11-17 (×4): 1 [drp] via OPHTHALMIC
  Filled 2023-11-15: qty 10

## 2023-11-15 MED ORDER — ACETAMINOPHEN 650 MG RE SUPP: 650.0000 mg | Freq: Four times a day (QID) | RECTAL | Status: DC | PRN

## 2023-11-15 MED ORDER — ACETAMINOPHEN 325 MG PO TABS: 650.0000 mg | ORAL_TABLET | Freq: Four times a day (QID) | ORAL | Status: DC | PRN

## 2023-11-15 MED ORDER — HEPARIN SODIUM (PORCINE) 5000 UNIT/ML IJ SOLN
5000.0000 [IU] | Freq: Three times a day (TID) | INTRAMUSCULAR | Status: DC
  Administered 2023-11-15 – 2023-11-17 (×5): 5000 [IU] via SUBCUTANEOUS
  Filled 2023-11-15 (×5): qty 1

## 2023-11-15 MED ORDER — ASPIRIN 81 MG PO CHEW
162.0000 mg | CHEWABLE_TABLET | Freq: Once | ORAL | Status: AC
  Administered 2023-11-15: 162 mg via ORAL
  Filled 2023-11-15: qty 2

## 2023-11-15 MED ORDER — PANTOPRAZOLE SODIUM 40 MG PO TBEC
40.0000 mg | DELAYED_RELEASE_TABLET | Freq: Every day | ORAL | Status: DC
  Administered 2023-11-15 – 2023-11-16 (×2): 40 mg via ORAL
  Filled 2023-11-15 (×2): qty 1

## 2023-11-15 MED ORDER — STROKE: EARLY STAGES OF RECOVERY BOOK
Freq: Once | Status: AC
  Filled 2023-11-15: qty 1

## 2023-11-15 MED ORDER — CLOPIDOGREL BISULFATE 75 MG PO TABS
75.0000 mg | ORAL_TABLET | Freq: Every day | ORAL | Status: DC
  Administered 2023-11-15 – 2023-11-17 (×3): 75 mg via ORAL
  Filled 2023-11-15 (×3): qty 1

## 2023-11-15 MED ORDER — LATANOPROST 0.005 % OP SOLN
1.0000 [drp] | Freq: Every day | OPHTHALMIC | Status: DC
  Administered 2023-11-15 – 2023-11-16 (×2): 1 [drp] via OPHTHALMIC
  Filled 2023-11-15: qty 2.5

## 2023-11-15 MED ORDER — BUPROPION HCL ER (XL) 150 MG PO TB24
150.0000 mg | ORAL_TABLET | Freq: Every day | ORAL | Status: DC
  Administered 2023-11-15 – 2023-11-17 (×3): 150 mg via ORAL
  Filled 2023-11-15 (×3): qty 1

## 2023-11-15 MED ORDER — ATORVASTATIN CALCIUM 80 MG PO TABS
80.0000 mg | ORAL_TABLET | Freq: Every day | ORAL | Status: DC
  Administered 2023-11-15 – 2023-11-16 (×2): 80 mg via ORAL
  Filled 2023-11-15 (×2): qty 1

## 2023-11-15 MED ORDER — CALCIUM ACETATE (PHOS BINDER) 667 MG PO CAPS
667.0000 mg | ORAL_CAPSULE | Freq: Two times a day (BID) | ORAL | Status: DC
  Administered 2023-11-16 – 2023-11-17 (×3): 667 mg via ORAL
  Filled 2023-11-15 (×3): qty 1

## 2023-11-15 NOTE — ED Provider Notes (Signed)
[place] EMERGENCY DEPARTMENT AT [facility] Provider Note   CSN: [id number] Arrival date & time: 11/15/23  4098  An emergency department physician performed an initial assessment on this suspected stroke patient at 0928.  History  Chief Complaint  Patient presents with   Code Stroke    Ove Amat is a 62 y.o. male.  HPI   62 year old male here as a code stroke.  Seen in conjunction with the stroke neurology team.  This morning on awakening patient was found to have left-sided weakness and loss of sensation.  Last known normal was midnight.  He has history of 2 previous strokes.  Patient taken emergently to CT, airway intact. ESRD on HD.  Home Medications Prior to Admission medications   Medication Sig Start Date End Date Taking? Authorizing Provider  amLODipine (NORVASC) 2.5 MG tablet Take 2.5 mg by mouth daily. 01/21/22   [provider]  aspirin EC 81 MG tablet Take 1 tablet (81 mg total) by mouth daily. 11/06/21   Darlin Priestly, MD  atorvastatin (LIPITOR) 80 MG tablet Take 80 mg by mouth daily.    [provider]  buPROPion (WELLBUTRIN XL) 150 MG 24 hr tablet Take 150 mg by mouth daily. 10/15/21   [provider]  calcium acetate (PHOSLO) 667 MG capsule Take 667 mg by mouth 3 (three) times daily. 11/26/21   [provider]  FLUoxetine (PROZAC) 20 MG capsule Take 20 mg by mouth daily. 08/08/21   [provider]  insulin glargine (LANTUS SOLOSTAR) 100 UNIT/ML Solostar Pen Inject 16 Units into the skin at bedtime. 12/08/22   Wieting, Richard, MD  latanoprost (XALATAN) 0.005 % ophthalmic solution Place 1 drop into both eyes at bedtime. 12/21/20   [provider]  melatonin 3 MG TABS tablet Take 6 mg by mouth at bedtime.    [provider]  metoCLOPramide (REGLAN) 5 MG tablet Take 1 tablet (5 mg total) by mouth 3 (three) times daily. 12/08/22 01/07/23  Alford Highland, MD  metoprolol succinate (TOPROL-XL) 25 MG 24 hr  tablet Take 1 tablet (25 mg total) by mouth daily. 12/08/22   Alford Highland, MD  midodrine (PROAMATINE) 2.5 MG tablet Take 2.5 mg by mouth as directed. On Dialysis days Monday, Wednesday and Friday 12/15/21   [provider]  multivitamin (RENA-VIT) TABS tablet Take 1 tablet by mouth at bedtime. 11/06/21   Darlin Priestly, MD  NOVOLOG FLEXPEN 100 UNIT/ML FlexPen Inject 7 Units into the skin 3 (three) times daily with meals. 12/08/22   Alford Highland, MD  pantoprazole (PROTONIX) 40 MG tablet Take 1 tablet (40 mg total) by mouth daily. 12/08/22 01/07/23  Alford Highland, MD  promethazine (PHENERGAN) 12.5 MG tablet Take 1 tablet (12.5 mg total) by mouth every 6 (six) hours as needed for nausea or vomiting. 12/08/22   Alford Highland, MD      Allergies    Liraglutide and Trulicity [dulaglutide]    Review of Systems   Review of Systems  Constitutional:  Negative for fever.  Eyes:  Negative for visual disturbance.  Respiratory:  Negative for shortness of breath.   Cardiovascular:  Negative for chest pain.  Gastrointestinal:  Negative for abdominal pain, diarrhea and vomiting.  Skin:  Negative for rash.  Neurological:  Positive for weakness and numbness. Negative for facial asymmetry, speech difficulty and headaches.    Physical Exam Updated Vital Signs BP (!) 140/63   Pulse 78   Resp 13   Wt 98.6 kg  SpO2 100%   BMI 32.10 kg/m  Physical Exam Vitals and nursing note reviewed.  Constitutional:      General: He is not in acute distress.    Appearance: Normal appearance.  HENT:     Head: Normocephalic.     Mouth/Throat:     Mouth: Mucous membranes are moist.  Eyes:     Pupils: Pupils are equal, round, and reactive to light.  Cardiovascular:     Rate and Rhythm: Normal rate.  Pulmonary:     Effort: Pulmonary effort is normal. No respiratory distress.  Abdominal:     Palpations: Abdomen is soft.     Tenderness: There is no abdominal tenderness.  Skin:    General: Skin is warm.   Neurological:     Mental Status: He is alert and oriented to person, place, and time.     Comments: NIH of 4 for left-sided weakness, sensory change and ataxia  Psychiatric:        Mood and Affect: Mood normal.     ED Results / Procedures / Treatments   Labs (all labs ordered are listed, but only abnormal results are displayed) Labs Reviewed  I-STAT CHEM 8, ED - Abnormal; Notable for the following components:      Result Value   Chloride 94 (*)    BUN 37 (*)    Creatinine, Ser 8.60 (*)    Glucose, Bld 148 (*)    Calcium, Ion 0.99 (*)    Hemoglobin 11.9 (*)    HCT 35.0 (*)    All other components within normal limits  PROTIME-INR  APTT  CBC  DIFFERENTIAL  COMPREHENSIVE METABOLIC PANEL  ETHANOL  CBG MONITORING, ED    EKG None  Radiology CT HEAD CODE STROKE WO CONTRAST Result Date: 11/15/2023 CLINICAL DATA:  Code stroke.  Neuro deficit, acute, stroke suspected EXAM: CT HEAD WITHOUT CONTRAST TECHNIQUE: Contiguous axial images were obtained from the base of the skull through the vertex without intravenous contrast. RADIATION DOSE REDUCTION: This exam was performed according to the departmental dose-optimization program which includes automated exposure control, adjustment of the mA and/or kV according to patient size and/or use of iterative reconstruction technique. COMPARISON:  CT head 12/06/2022. FINDINGS: Brain: No evidence of acute large vascular territory infarction, hemorrhage, hydrocephalus, extra-axial collection or mass lesion/mass effect. Vascular: No hyperdense vessel. Skull: No acute fracture. Sinuses/Orbits: Clear sinuses.  No acute orbital findings. Other: No mastoid effusions. ASPECTS [facility] Stroke Program Early CT Score) Total score (0-10 with 10 being normal): 10. IMPRESSION: 1. No evidence of acute intracranial abnormality. 2. ASPECTS is 10. Code stroke imaging results were communicated on 11/15/2023 at 9:37 am to provider Dr. Amada Jupiter via secure text paging.  Electronically Signed   By: Feliberto Harts M.D.   On: 11/15/2023 09:37    Procedures .Critical Care  Performed by: Rozelle Logan, DO Authorized by: Rozelle Logan, DO   Critical care provider statement:    Critical care time (minutes):  30   Critical care was necessary to treat or prevent imminent or life-threatening deterioration of the following conditions:  CNS failure or compromise   Critical care was time spent personally by me on the following activities:  Development of treatment plan with patient or surrogate, discussions with consultants, evaluation of patient's response to treatment, examination of patient, ordering and review of laboratory studies, ordering and review of radiographic studies, ordering and performing treatments and interventions, pulse oximetry, re-evaluation of patient's condition and review of old charts  I assumed direction of critical care for this patient from another provider in my specialty: no     Care discussed with: admitting provider       Medications Ordered in ED Medications  sodium chloride flush (NS) 0.9 % injection 3 mL (3 mLs Intravenous Given 11/15/23 0958)  iohexol (OMNIPAQUE) 350 MG/ML injection 75 mL (75 mLs Intravenous Contrast Given 11/15/23 0944)    ED Course/ Medical Decision Making/ A&P                                 Medical Decision Making Amount and/or Complexity of Data Reviewed Labs: ordered. Radiology: ordered.   62 year old male presents emergency department with left-sided weakness, numbness.  Seen as a code stroke in conjunction with the stroke neurology team.  NIH of 4.  Vital stable and airway intact.  Initial CT stroke imaging does not show any acute finding.  Plan from neurology is medicine admit for further CVA workup.  Symptoms are ongoing.  From a metabolic standpoint patient's kidney function is increased.  Baseline creatinine around 6, today is slightly above 8.  He is ESRD on HD, Monday Wednesday  Friday.  May be appropriately predialysis, electrolytes are stable.  Patients evaluation and results requires admission for further treatment and care.  Spoke with hospitalist, reviewed patient's ED course and they accept admission.  Patient agrees with admission plan, offers no new complaints and is stable/unchanged at time of admit.        Final Clinical Impression(s) / ED Diagnoses Final diagnoses:  None    Rx / DC Orders ED Discharge Orders     None         Rozelle Logan, DO 11/15/23 1159

## 2023-11-15 NOTE — Consult Note (Signed)
Renal Service Consult Note [facility]  Bobby Sampson 11/15/2023 Bobby Krabbe, MD Requesting Physician: Dr. Ninetta Lights  Reason for Consult: ESRD pt admitted for acute CVA HPI: The patient is a 62 y.o. year-old w/ PMH as below who presented to ED via EMS after waking up with left-sided stroke symptoms. 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 +DM2. ESRD on HD. Seen by neuro, he was out of the window and has no LVO. Plan is for admission and likely P.T. and secondary RF modification. We are asked to see for dialysis.   Pt states he had 2 prior R sided CVA's, one in 2017 and the other in 2021. He mostly has recovered from those, but his L side has been his "good side" still after CVA x 2 on the right. He says he didn't want to come here, he always goes to [facility], all his providers are at [facility] (except renal = [facility]), but the ambulance people brought him to [facility].   ROS - denies CP, no joint pain, no HA, no blurry vision, no rash, no diarrhea, no nausea/ vomiting, no dysuria, no difficulty voiding   Past Medical History  Past Medical History:  Diagnosis Date   Diabetes mellitus without complication (HCC)    Hypertension    Neuropathy    Renal disorder    Stroke [facility])    2016   Past Surgical History  Past Surgical History:  Procedure Laterality Date   INTRAMEDULLARY (IM) NAIL INTERTROCHANTERIC Right 11/02/2021   Procedure: INTRAMEDULLARY (IM) NAIL INTERTROCHANTRIC;  Surgeon: Juanell Fairly, MD;  Location: [facility] ORS;  Service: Orthopedics;  Laterality: Right;   TONSILLECTOMY     Family History  Family History  Problem Relation Age of Onset   Hypertension Mother    Diabetes Mother    Kidney disease Mother    Social History  reports that he has never smoked. He has never used smokeless tobacco. He reports that he does not currently use alcohol. He reports that he does not currently use drugs. Allergies  Allergies  Allergen Reactions   Liraglutide Nausea And Vomiting    Other  reaction(s): Nausea and vomiting, Fatigue   Trulicity [Dulaglutide] Nausea And Vomiting   Home medications Prior to Admission medications   Medication Sig Start Date End Date Taking? Authorizing Provider  amLODipine (NORVASC) 5 MG tablet Take 5 mg by mouth at bedtime.   Yes [provider]  aspirin EC 81 MG tablet Take 1 tablet (81 mg total) by mouth daily. 11/06/21  Yes Darlin Priestly, MD  atorvastatin (LIPITOR) 80 MG tablet Take 80 mg by mouth at bedtime.   Yes [provider]  B Complex-C-Zn-Folic Acid (DIALYVITE/ZINC) TABS Take 1 tablet by mouth at bedtime.   Yes [provider]  buPROPion (WELLBUTRIN XL) 150 MG 24 hr tablet Take 150 mg by mouth daily. 10/15/21  Yes [provider]  calcium acetate (PHOSLO) 667 MG capsule Take 667 mg by mouth in the morning and at bedtime. Pt gets a 3rd dose at dialysis 11/26/21  Yes [provider]  dorzolamide-timolol (COSOPT) 2-0.5 % ophthalmic solution Place 1 drop into both eyes 2 (two) times daily.   Yes [provider]  FLUoxetine (PROZAC) 20 MG capsule Take 20 mg by mouth at bedtime. 08/08/21  Yes [provider]  insulin glargine (LANTUS SOLOSTAR) 100 UNIT/ML Solostar Pen Inject 16 Units into the skin at bedtime. Patient taking differently: Inject 20 Units into the skin 2 (two) times daily.  12/08/22  Yes Wieting, Richard, MD  latanoprost (XALATAN) 0.005 % ophthalmic solution Place 1 drop into the left eye at bedtime. 12/21/20  Yes [provider]  melatonin 3 MG TABS tablet Take 6 mg by mouth at bedtime.   Yes [provider]  metoprolol succinate (TOPROL-XL) 25 MG 24 hr tablet Take 1 tablet (25 mg total) by mouth daily. 12/08/22  Yes Wieting, Richard, MD  midodrine (PROAMATINE) 2.5 MG tablet Take 2.5 mg by mouth as needed. 12/15/21  Yes [provider]  multivitamin (RENA-VIT) TABS tablet Take 1 tablet by mouth at bedtime. 11/06/21  Yes Darlin Priestly, MD  NOVOLOG FLEXPEN 100  UNIT/ML FlexPen Inject 7 Units into the skin 3 (three) times daily with meals. Patient taking differently: Inject into the skin 3 (three) times daily with meals. Sliding scale 12/08/22  Yes Wieting, Richard, MD  pantoprazole (PROTONIX) 40 MG tablet Take 1 tablet (40 mg total) by mouth daily. Patient taking differently: Take 40 mg by mouth at bedtime. 12/08/22 11/15/23 Yes Alford Highland, MD     Vitals:   11/15/23 1403 11/15/23 1414 11/15/23 1634 11/15/23 2006  BP:  (!) 177/72 (!) 164/78 (!) 148/75  Pulse:  82 80 80  Resp:  18 18 18   Temp: 98 F (36.7 C) 97.7 F (36.5 C) 97.9 F (36.6 C) 97.9 F (36.6 C)  TempSrc: Oral Oral Oral Oral  SpO2:  100% 99% 99%  Weight:       Exam Gen alert, no distress No rash, cyanosis or gangrene Sclera anicteric, throat clear  No jvd or bruits Chest clear bilat to bases, no rales/ wheezing RRR no MRG Abd soft ntnd no mass or ascites +bs GU nl male MS no joint effusions or deformity Ext no LE or UE edema, no wounds or ulcers Neuro is alert, Ox 3 , mild L sided weakness     RUA AVF+bruit    Renal-related home meds: - norvasc, toprol xl - midodrine 2.5 prn - renavite - phoslo 1 ac tid    OP HD: [address] MWF [facility] renal team  4h  2/2.5 bath   95.5kg   450/800   RUA AVF  Heparin ? (From 10/24)       Assessment/ Plan: Acute stroke-like symptoms - L sided numbness and weakness. Per neuro/ pmd.  ESKD - on HD MWF. Next HD Monday. Get records.  HTN - cont home bp lowering meds.  Volume - no vol ^ on exam, they are not supposed to pull 2.5 L w/ HD sessions per his heart doctor at [facility].  Anemia of eskd - Hb 11- 12, no esa needs.  MBD ckd - CCa in range, add on phos and cont binders w/ meals.  H/o prior CVA's DM2 on insulin       Bobby Moselle  MD [facility] 11/15/2023, 8:54 PM  Recent Labs  Lab 11/15/23 0953 11/15/23 0958  HGB 11.1* 11.9*  ALBUMIN 3.0*  --   CALCIUM 8.6*  --   CREATININE 8.38* 8.60*  K 4.2 4.1   Inpatient medications:   [START ON 11/16/2023]  stroke: early stages of recovery book   Does not apply Once   aspirin  81 mg Oral Daily   atorvastatin  80 mg Oral QHS   buPROPion  150 mg Oral Daily   [START ON 11/16/2023] calcium acetate  667 mg Oral BID WC   clopidogrel  75 mg Oral Daily   dorzolamide-timolol  1 drop Both Eyes BID  FLUoxetine  20 mg Oral QHS   heparin  5,000 Units Subcutaneous Q8H   insulin aspart  0-15 Units Subcutaneous TID WC   latanoprost  1 drop Left Eye QHS   pantoprazole  40 mg Oral QHS    acetaminophen **OR** acetaminophen, senna-docusate

## 2023-11-15 NOTE — Code Documentation (Signed)
Stroke Response Nurse Documentation Code Documentation  Bobby Sampson is a 62 y.o. male arriving to [facility]  via [facility] on 12/15 with past medical hx of CVA, Htn, DM, ESRD on dialysis. On clopidogrel 75 mg daily. Code stroke was activated by EMS.   Patient from home where he was LKW at 0000 when he went to bed last night and woke up this morning with L sided numbness.    Stroke team at the bedside on patient arrival. Labs drawn and patient cleared for CT by Dr. Wilkie Aye. Patient to CT with team. NIHSS 4, see documentation for details and code stroke times. Patient with left leg weakness, left limb ataxia, and left decreased sensation on exam. The following imaging was completed:  CT Head and CTA. Patient is not a candidate for IV Thrombolytic due to being out of the treatment window. Patient is not not a candidate for IR due to no LVO on imaging per MD.   Care Plan: q2 NIHSS and vitals .   Bedside handoff with ED RN Marchelle Folks.    Pearlie Oyster  Stroke Response RN

## 2023-11-15 NOTE — H&P (Cosign Needed Addendum)
Date: 11/15/2023               Patient Name:  Bobby Sampson MRN: [id number]  DOB: 06/24/1961 Age / Sex: 62 y.o., male   PCP: [facility]         Medical Service: Internal Medicine Teaching Service         Attending Physician:  First Contact:   Dr. Ninetta Lights, Lacretia Leigh, MD  Ezekiel Slocumb, MS3 Pager [phone]     Second Contact: Dr. Katheran James, DO Pager [phone]    Third Contact: Dr. Olegario Messier, MD Pager [phone]         After Hours (After 5p/  First Contact Pager: [phone]  weekends / holidays): Second Contact Pager: [phone]   SUBJECTIVE   Chief Complaint: Weakness and tingling of the left side  History of Present Illness: Bobby Sampson is a 62 y.o. 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 Last known normal was midnight by patient and wife. He noted a headache that he described as pressure over the left side of his head and pain in his neck that also began this morning. Denies changes in his vision (baseline is poor peripheral vision), hearing, or speech, but patient does endorse that his mouth feels numb. He also denies any chest pain, worsening of his baseline SOB, abdominal pain, fever, chills, or any recent sick contacts.   Patient noted that last Friday, after hemodialysis, that he felt nauseous and vomited. This self resolved. He completed the full session.    ED Course: Was seen in the ED in conjunction with the stroke neurology team.  CT did not show any acute finding.  CMP showed slight increase in creatine of 8 from baseline of 6.  Remaining electrolytes were appropriate  for predialysis.  Patient passed swallow screen in ED.   Meds:  -Cosopt 1 drop both eyes BID -Phoslo 667 mg TID -Fluoxetine 20 mg daily -amlodipine 5 mg daily -atorvastatin 80 mg daily  -protonix 40 mg daily -wellbutrin 150 mg daily  -latanoprost 1 drop into left eye at bedtime -metoprolol succinate 25 mg daily  -B complex-folic-biot-zinc (Dialyvite) -lantus 20 units BID -novolog SSI -ASA 81 mg daily   Past Medical History CVA Depression ESRD. Hypertension Hyperlipidemia Neurovascular glaucoma Peripheral vascular disease Proliferative diabetic retinopathy.  Surgical History  Tonsillectomy. 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 Able to perform IADL independently.  PCP: [facility] Substances: Denies tobacco use, alcohol use, and recreational drug use.  Family History:  Mother: CKD, Dementia, Diabetes Mellitus, HTN Father: Cancer (unknown type)  Allergies: Allergies as of 11/15/2023 - Review Complete 11/15/2023  Allergen Reaction Noted   Liraglutide Nausea And Vomiting 05/27/2018   Trulicity [dulaglutide] Nausea And Vomiting 11/07/2019    Review of Systems: A complete ROS was negative except as per HPI.   OBJECTIVE:  Physical Exam: Blood pressure (!) 184/90, pulse 79, temperature 97.7 F (36.5 C), temperature source Oral, resp. rate 19, weight 98.6 kg, SpO2 100%.  Physical Exam Vitals and nursing note reviewed.  Constitutional:      General: He is not in acute distress.    Appearance: He is not diaphoretic.  HENT:     Head: Normocephalic and atraumatic.     Right Ear: External ear normal.     Left Ear: External ear normal.     Mouth/Throat:     Mouth:  Mucous membranes are moist.     Pharynx: Oropharynx is clear.  Eyes:     General: Lids are normal. No scleral icterus.    Extraocular Movements: Extraocular movements intact.     Conjunctiva/sclera: Conjunctivae normal.     Comments: Baseline decreased peripheral vision of the left eye.   Cardiovascular:     Rate and Rhythm: Normal rate and regular rhythm.     Heart sounds: Normal heart sounds.  Pulmonary:     Effort: Pulmonary effort is normal.     Breath sounds: Normal breath sounds.  Chest:     Chest wall: No tenderness.  Abdominal:     General: Abdomen is flat. Bowel sounds are normal. There is no distension.     Palpations: Abdomen is soft. There is no mass.     Tenderness: There is no abdominal tenderness.  Musculoskeletal:     Cervical back: Full passive range of motion without pain, normal range of motion and neck supple. No edema, signs of trauma, rigidity or crepitus. No spinous process tenderness or muscular tenderness. Normal range of motion.     Right lower leg: No edema.     Left lower leg: No edema.     Comments: Normal strength of the shoulders, upper extremities, and lower extremities bilaterally.   Feet:     Comments: Amputation of the 1st phalanges and 2nd distal phalange on the left foot. Skin clean, dry, and intact.  Skin:    Comments: Hemodialysis access site located on the medial proximal right arm. Site clean, dry, and intact with palpable thrill  Neurological:     Mental Status: He is alert. Mental status is at baseline.     GCS: GCS eye subscore is 4. GCS verbal subscore is 5. GCS motor subscore is 6.     Motor: No weakness or abnormal muscle tone.     Comments: CN 2-12 intact with baseline, poor peripheral vision at baseline.  Sensation decreased on the left face, upper extremity, and lower extremity.        Labs: CBC    Component Value Date/Time   WBC 7.3 11/15/2023 0953   RBC 4.10 (L) 11/15/2023 0953   HGB 11.9 (L) 11/15/2023 0958   HCT 35.0  (L) 11/15/2023 0958   PLT 224 11/15/2023 0953   MCV 91.2 11/15/2023 0953   MCH 27.1 11/15/2023 0953   MCHC 29.7 (L) 11/15/2023 0953   RDW 16.2 (H) 11/15/2023 0953   LYMPHSABS 1.6 11/15/2023 0953   MONOABS 0.7 11/15/2023 0953   EOSABS 0.2 11/15/2023 0953   BASOSABS 0.1 11/15/2023 0953     CMP     Component Value Date/Time   NA 135 11/15/2023 0958   K 4.1 11/15/2023 0958   CL 94 (L) 11/15/2023 0958   CO2 27 11/15/2023 0953   GLUCOSE 148 (H) 11/15/2023 0958   BUN 37 (H) 11/15/2023 0958   CREATININE 8.60 (H) 11/15/2023 0958   CALCIUM 8.6 (  L) 11/15/2023 0953   PROT 6.0 (L) 11/15/2023 0953   ALBUMIN 3.0 (L) 11/15/2023 0953   AST 16 11/15/2023 0953   ALT 15 11/15/2023 0953   ALKPHOS 101 11/15/2023 0953   BILITOT 0.6 11/15/2023 0953   GFRNONAA 7 (L) 11/15/2023 0953   GFRAA 37 (L) 11/11/2019 0345    Imaging:  EKG: Personally reviewed my interpretation is sinus rhythm with normal axis. Prior EKG 04/2023 normal sinus rhythm with nonspecific T wave abnormality.  CT head without contrast: No acute pathology seen  CT angiogram head and neck without contrast: No emergent large vessel occlusion. Severe right greater than left intracranial ICA stenosis. 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 ASSESSMENT & PLAN:   Assessment & Plan by Problem: Principal Problem:   Stroke-like symptoms  Patient Summary Bobby Sampson is a 62 y.o. person living with a history of 2 right sided CVA with no residual deficit, HTN, T2DM with insulin dependence, HFpEF, and ESRD with HD MWF who presented with paraesthesias of his left face, upper extremity, and lower extremity and admitted for CVA workup on hospital day 0. CT in ED negative for acute pathology.   # Acute infarct in the right dorsal pons.  Patient presenting with acute onset of left-sided numbness,associated with decreased sensation. Prior history of two right sided CVA, with no residual deficit.  On my exam, he has  ataxia on the left FTN and HTS, and decreased sensation of the left side.  CT of the head negative for acute stroke.  CTA head and neck with no LVO . Evidence of right >left intracranial ICA stenosis, and right >left intraductal vertebral artery stenosis.  MRI of the brain Wo contrast showed an acute infarct in the right dorsal pons.  Patient was seen by neurology, with further workup to elucidate the etiology of the stroke.  -Cardiac monitoring..  - HbA1c - Lipid panel. - Echocardiogram - Clopidogrel 75 mg for 21 days - aspirin 81 mg monotherapy. - PT/OT/SLP.  # ESRD with HD M/W/F. # T2DM with long-term use of insulin A1c 7.8 about a month ago.  Last dialysis was on Friday 12/13.  Patient baseline SCr of 6--->8, cannot explain why other than his history of ESRD.  Stable electrolytes otherwise.  Nephrology is consulted, will plan for inpatient dialysis while hospitalized. Diabetes Home regimen includes, Lantus 16 units and NovoLog 7 units.  Will hold home insulin regimen while hospitalized. -Nephrology following, appreciate recs. -Daily BMP - SSI with meals  -Continue home PhosLo 66 mg 3 times daily.  #HTN:  BP 177/72 and allowing permissive hypertension in setting of suspected stroke. Home medications include amlodipine 2.5 mg and metoprolol succinate 25 mg daily. - Hold home HTN medications at this time    #HFpEF  No chest pain, worsening SOB, or leg edema. Most recent echo January of 2024 showed estimated ejection fraction of 55-60%.  - Holding home metoprolol succinate as stated above   Stable chronic conditions   # Depression:  continue home fluoxetine 20 mg and wellbutrin 150 mg daily.  # Bilateral glaucoma:  continue home latanoprost 0.005% eye drops at bedtime.   Diet: Renal VTE: Heparin IVF: None,None Code: Full  Prior to Admission Living Arrangement: Home, living with wife Anticipated Discharge Location: Home Barriers to Discharge: Stroke-like symptoms  Dispo:  Admit patient to Observation with expected length of stay less than 2 midnights.  Signed: Gilmer Mor, Medical Student Pager: [phone]  11/15/2023, 3:48 PM    Attestation  for Student Documentation:  I personally was present and re-performed the history, physical exam and medical decision-making activities of this service and have verified that the service and findings are accurately documented in the student's note.  Laretta Bolster, MD 11/15/2023, 10:23 PM

## 2023-11-15 NOTE — Hospital Course (Addendum)
  History of Present Illness: ***Bobby Sampson is a 62 y.o. male with PMH of ***  -0700-0800 woke up and felt left side tingling, cramping  -LWK was midnight by patient and patient's wife -endorses headache mainly on left side that started this morning, mostly pressure -endorses numbness of lips, speech not slurred  -endorses weakness in left leg  -notes vision changes peripheral that is chronic, no acute change   -previous history of stroke  -Last HD on Friday, felt nauseous and vomited, completed full session, ***  -denies chest pain or other pain  -ongoing SOB but unchanged -denies fever, recent illness or sick contacts  -denies any urinary symptoms   Meds:  -Cosopt 1 drop both eyes BID -Phoslo 667 mg TID -Fluoxetine 20 mg daily -amlodipine 5 mg daily -atorvastatin 80 mg daily  -protonix 40 mg daily -wellbutrin 150 mg daily  -latanoprost 1 drop into left eye at bedtime -metoprolol succinate 25 mg daily  -B complex-folic-biot-zinc (Dialyvite) -lantus 20 units BID -novolog SSI -ASA 81 mg daily   Past Medical History Past Medical History:  Diagnosis Date   Diabetes mellitus without complication (HCC)    Hypertension    Neuropathy    Renal disorder    Stroke ([facility])    2016    Past Surgical History  Past Surgical History:  Procedure Laterality Date   INTRAMEDULLARY (IM) NAIL INTERTROCHANTERIC Right 11/02/2021   Procedure: INTRAMEDULLARY (IM) NAIL INTERTROCHANTRIC;  Surgeon: Juanell Fairly, MD;  Location: [facility] ORS;  Service: Orthopedics;  Laterality: Right;   TONSILLECTOMY      Social:  Lives With: with wife and daughter Occupation: retired  Support: family  Level of Function: independent with ADLs but becomes quickly fatigued, uses walker PCP: [facility] Substances: -Tobacco *** -Alcohol *** -Recreational Drug ***  Family History: *** -mother: HTN, DM, kidney disease -father: unknown cancer Family History  Problem Relation Age of Onset    Hypertension Mother    Diabetes Mother    Kidney disease Mother     Allergies: Allergies as of 11/15/2023 - Review Complete 11/15/2023  Allergen Reaction Noted   Liraglutide Nausea And Vomiting 05/27/2018   Trulicity [dulaglutide] Nausea And Vomiting 11/07/2019

## 2023-11-15 NOTE — Progress Notes (Signed)
Patient off the unit for MRI.

## 2023-11-15 NOTE — ED Triage Notes (Signed)
Pt bib [facility] from [facility]. Upon awakening this morning around 0730-0800, pt noticed left side weakness and loss of sensation and had numbness in left extremeites.  Pt left side is normally his strong side and he noticed something was off and feelings felt like previous stroke 2 years ago,    LKW midnight.   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 Pt goes to dialysis 3X a week

## 2023-11-15 NOTE — Progress Notes (Signed)
Patient back to the unit.

## 2023-11-15 NOTE — Consult Note (Signed)
NEUROLOGY CONSULT NOTE   Date of service: November 15, 2023 Patient Name: Bobby Sampson MRN:  [id number] DOB:  July 30, 1961 Chief Complaint: Code Stroke History is obtained from: patient, EMS personnel, and past medical records  History of Present Illness  Bobby Sampson is a 62 y.o. male with past medical history of ESRD on HD MWF, Prior CVA, HTN, HLD, DM, CHF, obesity, OSA who presents to [facility] via [facility] for acute onset of numbness upon awakening this morning. LKW midnight. Code stroke activated by EMS.  Patient states he feels weak on his left side which is not normal for him since he has chronic weakness on right side from prior strokes. NIHSS 4. Code stroke CT head with no acute process, Aspects 10. Slight delay in obtaining CTA due to obtaining PIV access. CTA head and neck with no LVO    LKW: midnight  Modified rankin score: 1-No significant post stroke disability and can perform usual duties with stroke symptoms IV Thrombolysis: No, outside of window EVT: No, no LVO   1a Level of Conscious.: 0 1b LOC Questions: 0 1c LOC Commands: 0 2 Best Gaze: 0 3 Visual: 0 4 Facial Palsy: 0 5a Motor Arm - left: 0 5b Motor Arm - Right: 0 6a Motor Leg - Left: 1 6b Motor Leg - Right: 0 7 Limb Ataxia: 2 8 Sensory: 1 9 Best Language: 0 10 Dysarthria: 0 11 Extinct. and Inatten.: 0 TOTAL: 4    ROS  Comprehensive ROS performed and pertinent positives documented in HPI   Past History   Past Medical History:  Diagnosis Date   Diabetes mellitus without complication (HCC)    Hypertension    Neuropathy    Renal disorder    Stroke [facility])    2016    Past Surgical History:  Procedure Laterality Date   INTRAMEDULLARY (IM) NAIL INTERTROCHANTERIC Right 11/02/2021   Procedure: INTRAMEDULLARY (IM) NAIL INTERTROCHANTRIC;  Surgeon: Juanell Fairly, MD;  Location: [facility] ORS;  Service: Orthopedics;  Laterality: Right;   TONSILLECTOMY      Family History: Family History  Problem Relation Age of  Onset   Hypertension Mother    Diabetes Mother    Kidney disease Mother     Social History  reports that he has never smoked. He has never used smokeless tobacco. He reports that he does not currently use alcohol. He reports that he does not currently use drugs.  Allergies  Allergen Reactions   Liraglutide Nausea And Vomiting    Other reaction(s): Nausea and vomiting, Fatigue   Trulicity [Dulaglutide] Nausea And Vomiting    Medications   Current Facility-Administered Medications:    sodium chloride flush (NS) 0.9 % injection 3 mL, 3 mL, Intravenous, Once, Horton, Kristie M, DO  Current Outpatient Medications:    amLODipine (NORVASC) 2.5 MG tablet, Take 2.5 mg by mouth daily., Disp: , Rfl:    aspirin EC 81 MG tablet, Take 1 tablet (81 mg total) by mouth daily., Disp: , Rfl:    atorvastatin (LIPITOR) 80 MG tablet, Take 80 mg by mouth daily., Disp: , Rfl:    buPROPion (WELLBUTRIN XL) 150 MG 24 hr tablet, Take 150 mg by mouth daily., Disp: , Rfl:    calcium acetate (PHOSLO) 667 MG capsule, Take 667 mg by mouth 3 (three) times daily., Disp: , Rfl:    FLUoxetine (PROZAC) 20 MG capsule, Take 20 mg by mouth daily., Disp: , Rfl:    insulin glargine (LANTUS SOLOSTAR) 100 UNIT/ML Solostar Pen, Inject 16 Units  into the skin at bedtime., Disp: 15 mL, Rfl: 11   latanoprost (XALATAN) 0.005 % ophthalmic solution, Place 1 drop into both eyes at bedtime., Disp: , Rfl:    melatonin 3 MG TABS tablet, Take 6 mg by mouth at bedtime., Disp: , Rfl:    metoCLOPramide (REGLAN) 5 MG tablet, Take 1 tablet (5 mg total) by mouth 3 (three) times daily., Disp: 90 tablet, Rfl: 0   metoprolol succinate (TOPROL-XL) 25 MG 24 hr tablet, Take 1 tablet (25 mg total) by mouth daily., Disp: , Rfl:    midodrine (PROAMATINE) 2.5 MG tablet, Take 2.5 mg by mouth as directed. On Dialysis days Monday, Wednesday and Friday, Disp: , Rfl:    multivitamin (RENA-VIT) TABS tablet, Take 1 tablet by mouth at bedtime., Disp: , Rfl: 0    NOVOLOG FLEXPEN 100 UNIT/ML FlexPen, Inject 7 Units into the skin 3 (three) times daily with meals., Disp: 15 mL, Rfl:    pantoprazole (PROTONIX) 40 MG tablet, Take 1 tablet (40 mg total) by mouth daily., Disp: 30 tablet, Rfl: 0   promethazine (PHENERGAN) 12.5 MG tablet, Take 1 tablet (12.5 mg total) by mouth every 6 (six) hours as needed for nausea or vomiting., Disp: 15 tablet, Rfl: 0  Vitals   Vitals:   11/15/23 0900  Weight: 98.6 kg    Body mass index is 32.1 kg/m.  Physical Exam   Constitutional: Appears well-developed and well-nourished.  Psych: Affect appropriate to situation.  Eyes: No scleral injection.  HENT: No OP obstruction.  Head: Normocephalic.  Cardiovascular: Normal rate and regular rhythm.  Respiratory: Effort normal, non-labored breathing.  GI: Soft.  No distension. There is no tenderness.  Skin: WDI.   Neurologic Examination   Neuro: Mental Status: Patient is awake, alert, oriented to person, place, month, year, and situation. Patient is able to give a clear and coherent history. No signs of aphasia or neglect Cranial Nerves: II: Visual Fields are full. Pupils are equal, round, and reactive to light.   III,IV, VI: EOMI without ptosis or diploplia.  V: Facial sensation is symmetric to temperature VII: Facial movement is symmetric resting and smiling VIII: Hearing is intact to voice X: Palate elevates symmetrically XI: Shoulder shrug is symmetric. XII: Tongue protrudes midline without atrophy or fasciculations.  Motor: Tone is normal. Bulk is normal. 5/5 strength in right arm and right leg and left arm, left leg 4/5 with a drift  Sensory: Decreased sensation on the left arm and leg No extinction to DSS present.  Cerebellar: Ataxia on left FTN and HKS    Labs/Imaging/Neurodiagnostic studies   CBC: No results for input(s): "WBC", "NEUTROABS", "HGB", "HCT", "MCV", "PLT" in the last 168 hours.  Basic Metabolic Panel:  Lab Results  Component Value  Date   NA 136 04/01/2023   K 3.9 04/01/2023   CO2 27 04/01/2023   GLUCOSE 174 (H) 04/01/2023   BUN 44 (H) 04/01/2023   CREATININE 5.81 (H) 04/01/2023   CALCIUM 8.9 04/01/2023   GFRNONAA 10 (L) 04/01/2023   GFRAA 37 (L) 11/11/2019    Lipid Panel:  Lab Results  Component Value Date   LDLCALC 68 12/07/2022    HgbA1c:  Lab Results  Component Value Date   HGBA1C 8.0 (H) 12/07/2022    Urine Drug Screen:     Component Value Date/Time   LABOPIA NONE DETECTED 02/15/2021 1600   COCAINSCRNUR NONE DETECTED 02/15/2021 1600   LABBENZ NONE DETECTED 02/15/2021 1600   AMPHETMU NONE DETECTED 02/15/2021 1600  THCU NONE DETECTED 02/15/2021 1600   LABBARB NONE DETECTED 02/15/2021 1600     Alcohol Level     Component Value Date/Time   ETH <10 02/15/2021 1525    INR  Lab Results  Component Value Date   INR 1.1 11/02/2021    APTT  Lab Results  Component Value Date   APTT 28 11/02/2021    AED levels: No results found for: "PHENYTOIN", "ZONISAMIDE", "LAMOTRIGINE", "LEVETIRACETA"    Code Stroke CT Head without contrast(Personally reviewed):  No acute process, aspects 10   CT angio Head and Neck with contrast w/ perfusion (Personally reviewed): No LVO, official read is pending    ASSESSMENT   Narain Waugaman is a 62 y.o. male  history of ESRD on HD MWF, Prior CVA, HTN, HLD, DM, CHF, obesity, OSA who presents [facility] via [facility] for acute onset of numbness upon awakening this morning. LKW midnight.   RECOMMENDATIONS  - HgbA1c, fasting lipid panel - MRI of the brain without contrast - Echocardiogram - Prophylactic therapy-Antiplatelets   Load followed by  ASA 81mg  and Plavix 75mg  daily after 300 mg load - Risk factor modification - Telemetry monitoring - PT consult, OT consult, Speech consult - Stroke team to follow ______________________________________________________________   Gevena Mart DNP, ACNPC-AG  [facility]   I have seen the patient and  reviewed the above note.  He has acute onset left-sided ataxia and numbness.  My strong suspicion is that this represents a small acute ischemic infarct.  He is also the window for TNK and has no LVO.  He will need further evaluation for secondary risk factor modification as well as physical therapy.  Ritta Slot, MD [facility] [phone]  If 7pm- 7am, please page neurology on call as listed in AMION.

## 2023-11-15 NOTE — Plan of Care (Signed)
  Problem: Education: Goal: Knowledge of General Education information will improve Description: Including pain rating scale, medication(s)/side effects and non-pharmacologic comfort measures Outcome: Progressing   Problem: Clinical Measurements: Goal: Respiratory complications will improve Outcome: Progressing Goal: Cardiovascular complication will be avoided Outcome: Progressing   Problem: Skin Integrity: Goal: Risk for impaired skin integrity will decrease Outcome: Progressing   Problem: Education: Goal: Knowledge of disease or condition will improve Outcome: Progressing   Problem: Ischemic Stroke/TIA Tissue Perfusion: Goal: Complications of ischemic stroke/TIA will be minimized Outcome: Progressing   Problem: Coping: Goal: Will verbalize positive feelings about self Outcome: Progressing Goal: Will identify appropriate support needs Outcome: Progressing   Problem: Health Behavior/Discharge Planning: Goal: Goals will be collaboratively established with patient/family Outcome: Progressing   Problem: Self-Care: Goal: Verbalization of feelings and concerns over difficulty with self-care will improve Outcome: Progressing   Problem: Nutrition: Goal: Risk of aspiration will decrease Outcome: Progressing Goal: Dietary intake will improve Outcome: Progressing

## 2023-11-15 NOTE — ED Notes (Signed)
ED TO INPATIENT HANDOFF REPORT  ED Nurse Name and Phone #: [phone]  S Name/Age/Gender Bobby Sampson 62 y.o. male Room/Bed: 041C/041C  Code Status   Code Status: Full Code  Home/SNF/Other Home Patient oriented to: self, place, time, and situation Is this baseline? Yes   Triage Complete: Triage complete  Chief Complaint Stroke-like symptoms [R29.90]  Triage Note Pt bib  EMS from [facility]. Upon awakening this morning around 0730-0800, pt noticed left side weakness and loss of sensation and had numbness in left extremeites.  Pt left side is normally his strong side and he noticed something was off and feelings felt like previous stroke 2 years ago,    LKW midnight.   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 Pt goes to dialysis 3X a week      Allergies Allergies  Allergen Reactions   Liraglutide Nausea And Vomiting    Other reaction(s): Nausea and vomiting, Fatigue   Trulicity [Dulaglutide] Nausea And Vomiting    Level of Care/Admitting Diagnosis ED Disposition     ED Disposition  Admit   Condition  --   Comment  Hospital Area: [facility] [[id number]]  Level of Care: Progressive [102]  Admit to Progressive based on following criteria: NEUROLOGICAL AND NEUROSURGICAL complex patients with significant risk of instability, who do not meet ICU criteria, yet require close observation or frequent assessment (< / = every 2 - 4 hours) with medical / nursing intervention.  May place patient in observation at [facility] or [facility] if equivalent level of care is available:: No  Covid Evaluation: Asymptomatic - no recent exposure (last 10 days) testing not required  Diagnosis: Stroke-like symptoms [664403]  Admitting Physician: Ginnie Smart [[id number]]  Attending Physician: HATCHER, JEFFREY C [[id number]]          B Medical/Surgery History Past Medical History:   Diagnosis Date   Diabetes mellitus without complication (HCC)    Hypertension    Neuropathy    Renal disorder    Stroke (HCC)    2016   Past Surgical History:  Procedure Laterality Date   INTRAMEDULLARY (IM) NAIL INTERTROCHANTERIC Right 11/02/2021   Procedure: INTRAMEDULLARY (IM) NAIL INTERTROCHANTRIC;  Surgeon: Juanell Fairly, MD;  Location: [facility] ORS;  Service: Orthopedics;  Laterality: Right;   TONSILLECTOMY       A IV Location/Drains/Wounds Patient Lines/Drains/Airways Status     Active Line/Drains/Airways     Name Placement date Placement time Site Days   Peripheral IV 11/15/23 22 G Anterior;Left;Proximal Forearm 11/15/23  0930  Forearm  less than 1   Fistula / Graft Right Upper arm Arteriovenous fistula --  --  Upper arm  --   Hemodialysis Catheter Right Subclavian Double lumen Permanent (Tunneled) --  --  Subclavian  --   Airway 11/02/21  1453  -- 743   Wound / Incision (Open or Dehisced) 11/08/19 Diabetic ulcer Foot Left 11/08/19  0406  Foot  1468            Intake/Output Last 24 hours No intake or output data in the 24 hours ending 11/15/23 1331  Labs/Imaging Results for orders placed or performed during the hospital encounter of 11/15/23 (from the past 48 hours)  Protime-INR     Status: None   Collection Time: 11/15/23  9:53 AM  Result Value Ref Range   Prothrombin Time 14.8 11.4 - 15.2 seconds   INR  1.1 0.8 - 1.2    Comment: (NOTE) INR goal varies based on device and disease states. Performed at [facility], [address], [place], [place] [address]   APTT     Status: None   Collection Time: 11/15/23  9:53 AM  Result Value Ref Range   aPTT 31 24 - 36 seconds    Comment: Performed at [facility], [address] [place], [place] [address]  CBC     Status: Abnormal   Collection Time: 11/15/23  9:53 AM  Result Value Ref Range   WBC 7.3 4.0 - 10.5 K/uL   RBC 4.10 (L) 4.22 - 5.81 MIL/uL   Hemoglobin 11.1 (L) 13.0 - 17.0 g/dL   HCT  09.8 (L) 11.9 - 52.0 %   MCV 91.2 80.0 - 100.0 fL   MCH 27.1 26.0 - 34.0 pg   MCHC 29.7 (L) 30.0 - 36.0 g/dL   RDW 14.7 (H) 82.9 - 56.2 %   Platelets 224 150 - 400 K/uL   nRBC 0.0 0.0 - 0.2 %    Comment: Performed at [facility], [address] [place], [place] [address]  Differential     Status: None   Collection Time: 11/15/23  9:53 AM  Result Value Ref Range   Neutrophils Relative % 65 %   Neutro Abs 4.7 1.7 - 7.7 K/uL   Lymphocytes Relative 22 %   Lymphs Abs 1.6 0.7 - 4.0 K/uL   Monocytes Relative 10 %   Monocytes Absolute 0.7 0.1 - 1.0 K/uL   Eosinophils Relative 2 %   Eosinophils Absolute 0.2 0.0 - 0.5 K/uL   Basophils Relative 1 %   Basophils Absolute 0.1 0.0 - 0.1 K/uL   Immature Granulocytes 0 %   Abs Immature Granulocytes 0.03 0.00 - 0.07 K/uL    Comment: Performed at [facility], [address], [place], [place] [address]  Comprehensive metabolic panel     Status: Abnormal   Collection Time: 11/15/23  9:53 AM  Result Value Ref Range   Sodium 135 135 - 145 mmol/L   Potassium 4.2 3.5 - 5.1 mmol/L   Chloride 94 (L) 98 - 111 mmol/L   CO2 27 22 - 32 mmol/L   Glucose, Bld 155 (H) 70 - 99 mg/dL    Comment: Glucose reference range applies only to samples taken after fasting for at least 8 hours.   BUN 40 (H) 8 - 23 mg/dL   Creatinine, Ser 9.62 (H) 0.61 - 1.24 mg/dL   Calcium 8.6 (L) 8.9 - 10.3 mg/dL   Total Protein 6.0 (L) 6.5 - 8.1 g/dL   Albumin 3.0 (L) 3.5 - 5.0 g/dL   AST 16 15 - 41 U/L   ALT 15 0 - 44 U/L   Alkaline Phosphatase 101 38 - 126 U/L   Total Bilirubin 0.6 <1.2 mg/dL   GFR, Estimated 7 (L) >60 mL/min    Comment: (NOTE) Calculated using the CKD-EPI Creatinine Equation (2021)    Anion gap 14 5 - 15    Comment: Performed at [facility], [address] [place], [place] [address]  Ethanol     Status: None   Collection Time: 11/15/23  9:53 AM  Result Value Ref Range   Alcohol, Ethyl (B) <10 <10 mg/dL    Comment: (NOTE) Lowest  detectable limit for serum alcohol is 10 mg/dL.  For medical purposes only. Performed at [facility], [address], [place], [place] [address]   I-stat chem 8, ED     Status: Abnormal   Collection Time: 11/15/23  9:58 AM  Result Value Ref Range   Sodium 135 135 - 145 mmol/L   Potassium 4.1 3.5 - 5.1 mmol/L   Chloride 94 (L) 98 - 111 mmol/L   BUN 37 (H) 8 - 23 mg/dL   Creatinine, Ser 1.91 (H) 0.61 - 1.24 mg/dL   Glucose, Bld 478 (H) 70 - 99 mg/dL    Comment: Glucose reference range applies only to samples taken after fasting for at least 8 hours.   Calcium, Ion 0.99 (L) 1.15 - 1.40 mmol/L   TCO2 27 22 - 32 mmol/L   Hemoglobin 11.9 (L) 13.0 - 17.0 g/dL   HCT 29.5 (L) 62.1 - 30.8 %   CT ANGIO HEAD NECK W WO CM (CODE STROKE) Result Date: 11/15/2023 CLINICAL DATA:  Neuro deficit, acute, stroke suspected EXAM: CT ANGIOGRAPHY HEAD AND NECK WITH AND WITHOUT CONTRAST TECHNIQUE: Multidetector CT imaging of the head and neck was performed using the standard protocol during bolus administration of intravenous contrast. Multiplanar CT image reconstructions and MIPs were obtained to evaluate the vascular anatomy. Carotid stenosis measurements (when applicable) are obtained utilizing NASCET criteria, using the distal internal carotid diameter as the denominator. RADIATION DOSE REDUCTION: This exam was performed according to the departmental dose-optimization program which includes automated exposure control, adjustment of the mA and/or kV according to patient size and/or use of iterative reconstruction technique. CONTRAST:  75mL OMNIPAQUE IOHEXOL 350 MG/ML SOLN COMPARISON:  Same day CT head. FINDINGS: CTA NECK FINDINGS Aortic arch: Great vessel origins are patent without significant stenosis. Right carotid system: No evidence of dissection, stenosis (50% or greater), or occlusion. Left carotid system: No evidence of dissection, stenosis (50% or greater), or occlusion. Vertebral arteries: Left  dominant. No evidence of dissection, stenosis (50% or greater), or occlusion. Skeleton: No acute abnormality on limited assessment. Other neck: No acute abnormality on limited assessment Upper chest: Visualized lung apices are clear. Review of the MIP images confirms the above findings CTA HEAD FINDINGS Anterior circulation: Severe bilateral intracranial ICA stenosis due to atherosclerosis. This includes both cavernous and paraclinoid ICA stenosis. 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 Otherwise, bilateral MCAs and ACAs are patent without proximal hemodynamically significant stenosis. 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 Basilar artery and bilateral posterior cerebral arteries are patent without proximal hemodynamically significant stenosis. Venous sinuses: As permitted by contrast timing, patent. Review of the MIP images confirms the above findings IMPRESSION: 1. No emergent large vessel occlusion. 2. Severe right greater than left intracranial ICA stenosis. 3. 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 Electronically Signed   By: Feliberto Harts M.D.   On: 11/15/2023 10:04   CT HEAD CODE STROKE WO CONTRAST Result Date: 11/15/2023 CLINICAL DATA:  Code stroke.  Neuro deficit, acute, stroke suspected EXAM: CT HEAD WITHOUT CONTRAST TECHNIQUE: Contiguous axial images were obtained from the base of the skull through the vertex without intravenous contrast. RADIATION DOSE REDUCTION: This exam was performed according to the departmental dose-optimization program which includes automated exposure control, adjustment of the mA and/or kV according to patient size and/or use of iterative reconstruction technique. COMPARISON:  CT head 12/06/2022. FINDINGS: Brain: No evidence of acute large vascular territory infarction, hemorrhage, hydrocephalus, extra-axial collection or mass lesion/mass effect. Vascular: No hyperdense vessel.  Skull: No acute fracture. Sinuses/Orbits: Clear sinuses.  No acute orbital findings. Other: No mastoid  effusions. ASPECTS [facility] Stroke Program Early CT Score) Total score (0-10 with 10 being normal): 10. IMPRESSION: 1. No evidence of acute intracranial abnormality. 2. ASPECTS is 10. Code stroke imaging results were communicated on 11/15/2023 at 9:37 am to provider Dr. Amada Jupiter via secure text paging. Electronically Signed   By: Feliberto Harts M.D.   On: 11/15/2023 09:37    Pending Labs Unresulted Labs (From admission, onward)     Start     Ordered   11/16/23 0500  Basic metabolic panel  Tomorrow morning,   R        11/15/23 1323   11/16/23 0500  CBC  Tomorrow morning,   R        11/15/23 1323            Vitals/Pain Today's Vitals   11/15/23 1000 11/15/23 1003 11/15/23 1200 11/15/23 1322  BP: (!) 140/63  (!) 184/90   Pulse: 78  79 77  Resp: 13  19 18   Temp:  97.7 F (36.5 C)    TempSrc:  Oral    SpO2: 100%  100%   Weight:        Isolation Precautions No active isolations  Medications Medications  heparin injection 5,000 Units (has no administration in time range)  acetaminophen (TYLENOL) tablet 650 mg (has no administration in time range)    Or  acetaminophen (TYLENOL) suppository 650 mg (has no administration in time range)  senna-docusate (Senokot-S) tablet 1 tablet (has no administration in time range)  sodium chloride flush (NS) 0.9 % injection 3 mL (3 mLs Intravenous Given 11/15/23 0958)  iohexol (OMNIPAQUE) 350 MG/ML injection 75 mL (75 mLs Intravenous Contrast Given 11/15/23 0944)    Mobility walks with person assist     Focused Assessments    R Recommendations: See Admitting Provider Note  Report given to:   Additional Notes:

## 2023-11-15 NOTE — Progress Notes (Addendum)
Patient received from ED, alert and oriented X4, tele box connected, call bell within reach, will continue to  monitor

## 2023-11-16 ENCOUNTER — Observation Stay (HOSPITAL_COMMUNITY): Payer: 59

## 2023-11-16 DIAGNOSIS — Z992 Dependence on renal dialysis: Secondary | ICD-10-CM

## 2023-11-16 DIAGNOSIS — E1151 Type 2 diabetes mellitus with diabetic peripheral angiopathy without gangrene: Secondary | ICD-10-CM | POA: Diagnosis present

## 2023-11-16 DIAGNOSIS — M898X9 Other specified disorders of bone, unspecified site: Secondary | ICD-10-CM | POA: Diagnosis present

## 2023-11-16 DIAGNOSIS — N186 End stage renal disease: Secondary | ICD-10-CM | POA: Diagnosis present

## 2023-11-16 DIAGNOSIS — E1122 Type 2 diabetes mellitus with diabetic chronic kidney disease: Secondary | ICD-10-CM

## 2023-11-16 DIAGNOSIS — Z8673 Personal history of transient ischemic attack (TIA), and cerebral infarction without residual deficits: Secondary | ICD-10-CM | POA: Diagnosis not present

## 2023-11-16 DIAGNOSIS — I6350 Cerebral infarction due to unspecified occlusion or stenosis of unspecified cerebral artery: Secondary | ICD-10-CM | POA: Diagnosis not present

## 2023-11-16 DIAGNOSIS — I6389 Other cerebral infarction: Secondary | ICD-10-CM

## 2023-11-16 DIAGNOSIS — G4733 Obstructive sleep apnea (adult) (pediatric): Secondary | ICD-10-CM | POA: Diagnosis present

## 2023-11-16 DIAGNOSIS — E11649 Type 2 diabetes mellitus with hypoglycemia without coma: Secondary | ICD-10-CM | POA: Diagnosis not present

## 2023-11-16 DIAGNOSIS — D631 Anemia in chronic kidney disease: Secondary | ICD-10-CM | POA: Diagnosis present

## 2023-11-16 DIAGNOSIS — I69354 Hemiplegia and hemiparesis following cerebral infarction affecting left non-dominant side: Secondary | ICD-10-CM | POA: Diagnosis not present

## 2023-11-16 DIAGNOSIS — I10 Essential (primary) hypertension: Secondary | ICD-10-CM | POA: Diagnosis not present

## 2023-11-16 DIAGNOSIS — N2581 Secondary hyperparathyroidism of renal origin: Secondary | ICD-10-CM | POA: Diagnosis present

## 2023-11-16 DIAGNOSIS — R2990 Unspecified symptoms and signs involving the nervous system: Secondary | ICD-10-CM

## 2023-11-16 DIAGNOSIS — Z794 Long term (current) use of insulin: Secondary | ICD-10-CM

## 2023-11-16 DIAGNOSIS — Z79899 Other long term (current) drug therapy: Secondary | ICD-10-CM | POA: Diagnosis not present

## 2023-11-16 DIAGNOSIS — I69351 Hemiplegia and hemiparesis following cerebral infarction affecting right dominant side: Secondary | ICD-10-CM | POA: Diagnosis not present

## 2023-11-16 DIAGNOSIS — I63012 Cerebral infarction due to thrombosis of left vertebral artery: Secondary | ICD-10-CM | POA: Diagnosis not present

## 2023-11-16 DIAGNOSIS — R270 Ataxia, unspecified: Secondary | ICD-10-CM | POA: Diagnosis present

## 2023-11-16 DIAGNOSIS — E119 Type 2 diabetes mellitus without complications: Secondary | ICD-10-CM

## 2023-11-16 DIAGNOSIS — E113599 Type 2 diabetes mellitus with proliferative diabetic retinopathy without macular edema, unspecified eye: Secondary | ICD-10-CM | POA: Diagnosis present

## 2023-11-16 DIAGNOSIS — G8194 Hemiplegia, unspecified affecting left nondominant side: Secondary | ICD-10-CM | POA: Diagnosis present

## 2023-11-16 DIAGNOSIS — I5032 Chronic diastolic (congestive) heart failure: Secondary | ICD-10-CM | POA: Diagnosis present

## 2023-11-16 DIAGNOSIS — R531 Weakness: Secondary | ICD-10-CM | POA: Diagnosis present

## 2023-11-16 DIAGNOSIS — E785 Hyperlipidemia, unspecified: Secondary | ICD-10-CM | POA: Diagnosis present

## 2023-11-16 DIAGNOSIS — I63019 Cerebral infarction due to thrombosis of unspecified vertebral artery: Secondary | ICD-10-CM | POA: Diagnosis not present

## 2023-11-16 DIAGNOSIS — I132 Hypertensive heart and chronic kidney disease with heart failure and with stage 5 chronic kidney disease, or end stage renal disease: Secondary | ICD-10-CM | POA: Diagnosis present

## 2023-11-16 DIAGNOSIS — R29704 NIHSS score 4: Secondary | ICD-10-CM | POA: Diagnosis present

## 2023-11-16 DIAGNOSIS — E1142 Type 2 diabetes mellitus with diabetic polyneuropathy: Secondary | ICD-10-CM | POA: Diagnosis present

## 2023-11-16 DIAGNOSIS — F32A Depression, unspecified: Secondary | ICD-10-CM | POA: Diagnosis present

## 2023-11-16 DIAGNOSIS — I6322 Cerebral infarction due to unspecified occlusion or stenosis of basilar arteries: Secondary | ICD-10-CM | POA: Diagnosis not present

## 2023-11-16 DIAGNOSIS — I951 Orthostatic hypotension: Secondary | ICD-10-CM | POA: Diagnosis not present

## 2023-11-16 DIAGNOSIS — R278 Other lack of coordination: Secondary | ICD-10-CM | POA: Diagnosis present

## 2023-11-16 DIAGNOSIS — H409 Unspecified glaucoma: Secondary | ICD-10-CM | POA: Diagnosis present

## 2023-11-16 DIAGNOSIS — I6329 Cerebral infarction due to unspecified occlusion or stenosis of other precerebral arteries: Secondary | ICD-10-CM | POA: Diagnosis present

## 2023-11-16 DIAGNOSIS — E669 Obesity, unspecified: Secondary | ICD-10-CM | POA: Diagnosis present

## 2023-11-16 LAB — ECHOCARDIOGRAM COMPLETE
Area-P 1/2: 4.36 cm2
S' Lateral: 3.2 cm
Weight: 3477.98 [oz_av]

## 2023-11-16 LAB — GLUCOSE, CAPILLARY
Glucose-Capillary: 185 mg/dL — ABNORMAL HIGH (ref 70–99)
Glucose-Capillary: 233 mg/dL — ABNORMAL HIGH (ref 70–99)
Glucose-Capillary: 144 mg/dL — ABNORMAL HIGH (ref 70–99)
Glucose-Capillary: 217 mg/dL — ABNORMAL HIGH (ref 70–99)

## 2023-11-16 LAB — CBC
HCT: 36.8 % — ABNORMAL LOW (ref 39.0–52.0)
Hemoglobin: 12 g/dL — ABNORMAL LOW (ref 13.0–17.0)
MCH: 27 pg (ref 26.0–34.0)
MCHC: 32.6 g/dL (ref 30.0–36.0)
MCV: 82.7 fL (ref 80.0–100.0)
Platelets: 271 10*3/uL (ref 150–400)
RBC: 4.45 MIL/uL (ref 4.22–5.81)
RDW: 16 % — ABNORMAL HIGH (ref 11.5–15.5)
WBC: 6.8 10*3/uL (ref 4.0–10.5)
nRBC: 0 % (ref 0.0–0.2)

## 2023-11-16 LAB — HEPATITIS B SURFACE ANTIGEN: Hepatitis B Surface Ag: NONREACTIVE

## 2023-11-16 LAB — BASIC METABOLIC PANEL
Anion gap: 14 (ref 5–15)
BUN: 54 mg/dL — ABNORMAL HIGH (ref 8–23)
CO2: 27 mmol/L (ref 22–32)
Calcium: 8.9 mg/dL (ref 8.9–10.3)
Chloride: 91 mmol/L — ABNORMAL LOW (ref 98–111)
Creatinine, Ser: 10.01 mg/dL — ABNORMAL HIGH (ref 0.61–1.24)
GFR, Estimated: 5 mL/min — ABNORMAL LOW (ref >60)
Glucose, Bld: 185 mg/dL — ABNORMAL HIGH (ref 70–99)
Potassium: 3.8 mmol/L (ref 3.5–5.1)
Sodium: 132 mmol/L — ABNORMAL LOW (ref 135–145)

## 2023-11-16 LAB — LIPID PANEL
Cholesterol: 114 mg/dL (ref 0–200)
HDL: 38 mg/dL — ABNORMAL LOW (ref >40)
LDL Cholesterol: 53 mg/dL (ref 0–99)
Total CHOL/HDL Ratio: 3 {ratio}
Triglycerides: 114 mg/dL (ref <150)
VLDL: 23 mg/dL (ref 0–40)

## 2023-11-16 MED ORDER — ANTICOAGULANT SODIUM CITRATE 4% (200MG/5ML) IV SOLN: 5.0000 mL | Status: DC | PRN

## 2023-11-16 MED ORDER — NEPRO/CARBSTEADY PO LIQD: 237.0000 mL | ORAL | Status: DC | PRN

## 2023-11-16 MED ORDER — HEPARIN SODIUM (PORCINE) 1000 UNIT/ML IJ SOLN
2000.0000 [IU] | Freq: Once | INTRAMUSCULAR | Status: AC
  Administered 2023-11-16: 2000 [IU] via INTRAVENOUS
  Filled 2023-11-16: qty 2

## 2023-11-16 MED ORDER — LIDOCAINE HCL (PF) 1 % IJ SOLN: 5.0000 mL | INTRAMUSCULAR | Status: DC | PRN

## 2023-11-16 MED ORDER — CALCITRIOL 0.5 MCG PO CAPS
0.5000 ug | ORAL_CAPSULE | ORAL | Status: DC
Start: 2023-11-16 — End: 2023-11-17
  Administered 2023-11-16: 0.5 ug via ORAL
  Filled 2023-11-16: qty 1

## 2023-11-16 MED ORDER — PERFLUTREN LIPID MICROSPHERE
1.0000 mL | INTRAVENOUS | Status: AC | PRN
  Administered 2023-11-16: 3 mL via INTRAVENOUS

## 2023-11-16 MED ORDER — PENTAFLUOROPROP-TETRAFLUOROETH EX AERO: 1.0000 | INHALATION_SPRAY | CUTANEOUS | Status: DC | PRN

## 2023-11-16 MED ORDER — ALTEPLASE 2 MG IJ SOLR: 2.0000 mg | Freq: Once | INTRAMUSCULAR | Status: DC | PRN

## 2023-11-16 MED ORDER — HEPARIN SODIUM (PORCINE) 1000 UNIT/ML DIALYSIS
2000.0000 [IU] | Freq: Once | INTRAMUSCULAR | Status: DC
Start: 2023-11-17 — End: 2023-11-16
  Filled 2023-11-16: qty 2

## 2023-11-16 MED ORDER — LIDOCAINE-PRILOCAINE 2.5-2.5 % EX CREA: 1.0000 | TOPICAL_CREAM | CUTANEOUS | Status: DC | PRN

## 2023-11-16 MED ORDER — HEPARIN SODIUM (PORCINE) 1000 UNIT/ML DIALYSIS
2000.0000 [IU] | INTRAMUSCULAR | Status: DC | PRN
Start: 2023-11-17 — End: 2023-11-16

## 2023-11-16 MED ORDER — HEPARIN SODIUM (PORCINE) 1000 UNIT/ML DIALYSIS: 1000.0000 [IU] | INTRAMUSCULAR | Status: DC | PRN

## 2023-11-16 MED ORDER — INSULIN GLARGINE-YFGN 100 UNIT/ML ~~LOC~~ SOLN
10.0000 [IU] | Freq: Two times a day (BID) | SUBCUTANEOUS | Status: DC
  Administered 2023-11-16: 10 [IU] via SUBCUTANEOUS
  Filled 2023-11-16 (×2): qty 0.1

## 2023-11-16 NOTE — Evaluation (Signed)
Physical Therapy Evaluation Patient Details Name: Keyner Finnin MRN: [id number] DOB: 1961/07/17 Today's Date: 11/16/2023  History of Present Illness  62 yo male presenting to ED on 12/15 with L sided weakness/numbness. 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 PMH including ESRD on HD MWF, Prior CVA, HTN, HLD, DM, CHF, obesity, OSA.  Clinical Impression  Patient presents with decreased mobility due to L side incoordination, decreased sensation, decreased balance and decreased activity tolerance.  Previously patient was able to ambulate with RW at home independently.  Has had some falls (2) in past 6 months.  Reports wife assists with IADL's and some lower body dressing.  Patient currently mod A for transfers and ambulation with RW and chair follow due to fatigues quickly.  Patient will continue to benefit from skilled PT in the acute setting and from follow up intensive inpatient rehab (>3 hours/day) at d/c.       If plan is discharge home, recommend the following: Supervision due to cognitive status;A little help with walking and/or transfers;Assistance with cooking/housework;Assist for transportation;A little help with bathing/dressing/bathroom;Help with stairs or ramp for entrance   Can travel by private vehicle        Equipment Recommendations Other (comment) (TBA)  Recommendations for Other Services  Rehab consult    Functional Status Assessment Patient has had a recent decline in their functional status and demonstrates the ability to make significant improvements in function in a reasonable and predictable amount of time.     Precautions / Restrictions Precautions Precautions: Fall;Other (comment) Precaution Comments: Decreased LE coordination Restrictions Weight Bearing Restrictions Per Provider Order: No      Mobility  Bed Mobility               General bed mobility comments: In recliner upon arrival    Transfers Overall transfer level: Needs  assistance Equipment used: Rolling walker (2 wheels) Transfers: Sit to/from Stand Sit to Stand: Mod assist, +2 safety/equipment           General transfer comment: Mod A for power up and gaining of balance. Cues for pushing at arm rests; performed x 3 during session, less assist needed with subsequent trials    Ambulation/Gait Ambulation/Gait assistance: Mod assist, +2 safety/equipment Gait Distance (Feet): 40 Feet (x 2) Assistive device: Rolling walker (2 wheels) Gait Pattern/deviations: Step-to pattern, Step-through pattern, Ataxic, Staggering left, Narrow base of support, Wide base of support       General Gait Details: cues for L foot to L due to crossing midline at times with L then with too wide stance; LOB to L at times needing mod A to correct; adjusted walker height and applied shoes for balance prior to ambulation  Stairs            Wheelchair Mobility     Tilt Bed    Modified Rankin (Stroke Patients Only) Modified Rankin (Stroke Patients Only) Pre-Morbid Rankin Score: Moderate disability Modified Rankin: Moderately severe disability     Balance Overall balance assessment: Needs assistance   Sitting balance-Leahy Scale: Fair     Standing balance support: Bilateral upper extremity supported Standing balance-Leahy Scale: Poor Standing balance comment: UE support for balance and A (min A for static, mod A dynamic) due to L lateral lean during standing at sink for oral hygiene                             Pertinent Vitals/Pain Pain Assessment Pain Assessment:  No/denies pain    Home Living Family/patient expects to be discharged to:: Private residence Living Arrangements: Children;Spouse/significant other Available Help at Discharge: Family;Available 24 hours/day;Personal care attendant (wife is a CNA) Type of Home: House Home Access: Ramped entrance       Home Layout: One level Home Equipment: Cane - quad;Cane - single point;Rollator  (4 wheels);Tub bench;BSC/3in1;Rolling Walker (2 wheels);Wheelchair - manual;Grab bars - tub/shower      Prior Function Prior Level of Function : Independent/Modified Independent             Mobility Comments: Using RW for mobility. 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 Wife does IADLs and driving     Extremity/Trunk Assessment   Upper Extremity Assessment Upper Extremity Assessment: Defer to OT evaluation RUE Deficits / Details: 4/5 overall. Decreased FM and in hand manipulation. Chronic shoulder injury from football. RUE Coordination: decreased fine motor;decreased gross motor LUE Deficits / Details: 4/5 overall. Decreased FM and in hand manipulation LUE Coordination: decreased fine motor;decreased gross motor    Lower Extremity Assessment Lower Extremity Assessment: RLE deficits/detail;LLE deficits/detail RLE Deficits / Details: AROM WFL, strength grossly 4/5 (reports this is his "weak" leg from prior strokes) RLE Sensation: WNL LLE Deficits / Details: AROM WFL, strength WFL, coordination deficits with difficulty with grading movement and targeting/placement, decreased sensation to light touch LLE Sensation: decreased proprioception;decreased light touch LLE Coordination: decreased fine motor;decreased gross motor       Communication   Communication Communication: No apparent difficulties  Cognition Arousal: Alert Behavior During Therapy: WFL for tasks assessed/performed Overall Cognitive Status: Within Functional Limits for tasks assessed                                          General Comments General comments (skin integrity, edema, etc.): VSS with activity; education on follow up recommendations.   Admits to severe fatigue following dialysis sessions.    Exercises     Assessment/Plan    PT Assessment Patient needs continued PT services  PT Problem List Decreased strength;Decreased mobility;Decreased coordination;Impaired sensation;Decreased balance;Decreased safety awareness;Decreased activity tolerance       PT Treatment Interventions DME instruction;Gait training;Patient/family education;Functional mobility training;Therapeutic activities;Therapeutic exercise;Balance training;Stair training    PT Goals (Current goals can be found in the Care Plan section)  Acute Rehab PT Goals Patient Stated Goal: to get stronger and go home PT Goal Formulation: With patient Time For Goal Achievement: 11/30/23 Potential to Achieve Goals: Good    Frequency Min 1X/week     Co-evaluation PT/OT/SLP Co-Evaluation/Treatment: Yes Reason for Co-Treatment: To address functional/ADL transfers;For patient/therapist safety PT goals addressed during session: Mobility/safety with mobility;Balance;Proper use of DME OT goals addressed during session: ADL's and self-care       AM-PAC PT "6 Clicks" Mobility  Outcome Measure Help needed turning from your back to your side while in a flat bed without using bedrails?: A Little Help needed moving from lying on your back to sitting on the side of a flat bed without using bedrails?: A Lot Help needed moving to and from a bed to a chair (including a wheelchair)?: A Lot Help needed standing up  from a chair using your arms (e.g., wheelchair or bedside chair)?: Total Help needed to walk in hospital room?: Total Help needed climbing 3-5 steps with a railing? : Total 6 Click Score: 10    End of Session Equipment Utilized During Treatment: Gait belt Activity Tolerance: Patient tolerated treatment well Patient left: in chair;with call bell/phone within reach;with chair alarm set   PT Visit Diagnosis: Other abnormalities of gait and mobility (R26.89)     Time: 0940-1005 PT Time Calculation (min) (ACUTE ONLY): 25 min   Charges:   PT Evaluation $PT Eval Moderate Complexity: 1 Mod   PT General Charges $$ ACUTE PT VISIT: 1 Visit         Sheran Lawless, PT Acute Rehabilitation Services Office:[phone] 11/16/2023   Elray Mcgregor 11/16/2023, 11:57 AM

## 2023-11-16 NOTE — Plan of Care (Signed)
  Problem: Education: Goal: Knowledge of General Education information will improve Description: Including pain rating scale, medication(s)/side effects and non-pharmacologic comfort measures Outcome: Progressing   Problem: Health Behavior/Discharge Planning: Goal: Ability to manage health-related needs will improve Outcome: Progressing   Problem: Clinical Measurements: Goal: Ability to maintain clinical measurements within normal limits will improve Outcome: Progressing Goal: Will remain free from infection Outcome: Progressing Goal: Diagnostic test results will improve Outcome: Progressing Goal: Respiratory complications will improve Outcome: Progressing Goal: Cardiovascular complication will be avoided Outcome: Progressing   Problem: Activity: Goal: Risk for activity intolerance will decrease Outcome: Progressing   Problem: Nutrition: Goal: Adequate nutrition will be maintained Outcome: Progressing   Problem: Coping: Goal: Level of anxiety will decrease Outcome: Progressing   Problem: Elimination: Goal: Will not experience complications related to bowel motility Outcome: Progressing Goal: Will not experience complications related to urinary retention Outcome: Progressing   Problem: Pain Management: Goal: General experience of comfort will improve Outcome: Progressing   Problem: Safety: Goal: Ability to remain free from injury will improve Outcome: Progressing   Problem: Skin Integrity: Goal: Risk for impaired skin integrity will decrease Outcome: Progressing   Problem: Education: Goal: Knowledge of disease or condition will improve Outcome: Progressing Goal: Knowledge of secondary prevention will improve (MUST DOCUMENT ALL) Outcome: Progressing Goal: Knowledge of patient specific risk factors will improve Loraine Leriche N/A or DELETE if not current risk factor) Outcome: Progressing   Problem: Ischemic Stroke/TIA Tissue Perfusion: Goal: Complications of ischemic  stroke/TIA will be minimized Outcome: Progressing   Problem: Coping: Goal: Will verbalize positive feelings about self Outcome: Progressing Goal: Will identify appropriate support needs Outcome: Progressing   Problem: Health Behavior/Discharge Planning: Goal: Ability to manage health-related needs will improve Outcome: Progressing Goal: Goals will be collaboratively established with patient/family Outcome: Progressing   Problem: Self-Care: Goal: Ability to participate in self-care as condition permits will improve Outcome: Progressing Goal: Verbalization of feelings and concerns over difficulty with self-care will improve Outcome: Progressing Goal: Ability to communicate needs accurately will improve Outcome: Progressing   Problem: Nutrition: Goal: Risk of aspiration will decrease Outcome: Progressing Goal: Dietary intake will improve Outcome: Progressing   Problem: Education: Goal: Ability to describe self-care measures that may prevent or decrease complications (Diabetes Survival Skills Education) will improve Outcome: Progressing Goal: Individualized Educational Video(s) Outcome: Progressing   Problem: Coping: Goal: Ability to adjust to condition or change in health will improve Outcome: Progressing   Problem: Fluid Volume: Goal: Ability to maintain a balanced intake and output will improve Outcome: Progressing   Problem: Health Behavior/Discharge Planning: Goal: Ability to identify and utilize available resources and services will improve Outcome: Progressing Goal: Ability to manage health-related needs will improve Outcome: Progressing   Problem: Metabolic: Goal: Ability to maintain appropriate glucose levels will improve Outcome: Progressing   Problem: Nutritional: Goal: Maintenance of adequate nutrition will improve Outcome: Progressing Goal: Progress toward achieving an optimal weight will improve Outcome: Progressing   Problem: Skin  Integrity: Goal: Risk for impaired skin integrity will decrease Outcome: Progressing   Problem: Tissue Perfusion: Goal: Adequacy of tissue perfusion will improve Outcome: Progressing

## 2023-11-16 NOTE — Progress Notes (Signed)
Inpatient Rehab Admissions Coordinator:   Stopped by to discuss rehab recommendations with pt, but he was leaving the unit for HD.  He did say he wanted to pursue CIR.  Note rehab MD has expected mod I level goals.  I will start insurance request today and f/u with pt and spouse tomorrow.   Estill Dooms, PT, DPT Admissions Coordinator [phone] 11/16/23  3:05 PM

## 2023-11-16 NOTE — Progress Notes (Signed)
[facility] Progress Note   Subjective:   Seen in room - feels ok. Still with L sided "tingling", strength improving. Worked with PT this AM. For HD later today.  Objective Vitals:   11/15/23 2337 11/16/23 0400 11/16/23 0756 11/16/23 1130  BP: (!) 168/83 (!) 166/82 (!) 169/77 (!) 161/78  Pulse: 73 75 77 74  Resp: 18 16 18 18   Temp: 97.9 F (36.6 C) 97.9 F (36.6 C) (!) 97.5 F (36.4 C) 97.9 F (36.6 C)  TempSrc: Oral Oral Oral Oral  SpO2: 92% 92% 98% 96%  Weight:       Physical Exam General: Well appearing man, NAD. Room air Heart: RRR; no murmur Lungs: CTA anteriorly Abdomen: soft Extremities: no LE edema Dialysis Access: RUE AVF +t/b  Additional Objective Labs: Basic Metabolic Panel: Recent Labs  Lab 11/15/23 0953 11/15/23 0958 11/16/23 0613  NA 135 135 132*  K 4.2 4.1 3.8  CL 94* 94* 91*  CO2 27  --  27  GLUCOSE 155* 148* 185*  BUN 40* 37* 54*  CREATININE 8.38* 8.60* 10.01*  CALCIUM 8.6*  --  8.9   Liver Function Tests: Recent Labs  Lab 11/15/23 0953  AST 16  ALT 15  ALKPHOS 101  BILITOT 0.6  PROT 6.0*  ALBUMIN 3.0*   CBC: Recent Labs  Lab 11/15/23 0953 11/15/23 0958 11/16/23 0613  WBC 7.3  --  6.8  NEUTROABS 4.7  --   --   HGB 11.1* 11.9* 12.0*  HCT 37.4* 35.0* 36.8*  MCV 91.2  --  82.7  PLT 224  --  271   CBG: Recent Labs  Lab 11/15/23 1635 11/15/23 2109 11/16/23 0604 11/16/23 1131  GLUCAP 221* 210* 185* 233*   Studies/Results: ECHOCARDIOGRAM COMPLETE Result Date: 11/16/2023    ECHOCARDIOGRAM REPORT   Patient Name:   Bobby Sampson Date of Exam: 11/16/2023 Medical Rec #:  [id number]  Height:       69.0 in Accession #:    [id number] Weight:       217.4 lb Date of Birth:  1961/09/30 BSA:          2.140 m Patient Age:    62 years   BP:           169/77 mmHg Patient Gender: M          HR:           65 bpm. Exam Location:  Inpatient Procedure: 2D Echo, Color Doppler, Cardiac Doppler and Intracardiac            Opacification  Agent Indications:    Stroke i63.9  History:        Patient has prior history of Echocardiogram examinations, most                 recent 12/07/2022. CHF; Risk Factors:Hypertension, Diabetes and                 Sleep Apnea.  Sonographer:    Irving Burton Senior RDCS Referring Phys: Kalman Drape WOLFE IMPRESSIONS  1. Left ventricular ejection fraction, by estimation, is 55 to 60%. The left ventricle has normal function. The left ventricle has no regional wall motion abnormalities. Left ventricular diastolic parameters are consistent with Grade I diastolic dysfunction (impaired relaxation).  2. Right ventricular systolic function is normal. The right ventricular size is normal. Tricuspid regurgitation signal is inadequate for assessing PA pressure.  3. The mitral valve is normal in structure. No evidence of mitral valve regurgitation. No  evidence of mitral stenosis.  4. The aortic valve is tricuspid. Aortic valve regurgitation is not visualized. No aortic stenosis is present.  5. The inferior vena cava is normal in size with greater than 50% respiratory variability, suggesting right atrial pressure of 3 mmHg. FINDINGS  Left Ventricle: Left ventricular ejection fraction, by estimation, is 55 to 60%. The left ventricle has normal function. The left ventricle has no regional wall motion abnormalities. Definity contrast agent was given IV to delineate the left ventricular  endocardial borders. The left ventricular internal cavity size was normal in size. There is no left ventricular hypertrophy. Left ventricular diastolic parameters are consistent with Grade I diastolic dysfunction (impaired relaxation). Right Ventricle: The right ventricular size is normal. No increase in right ventricular wall thickness. Right ventricular systolic function is normal. Tricuspid regurgitation signal is inadequate for assessing PA pressure. Left Atrium: Left atrial size was normal in size. Right Atrium: Right atrial size was normal in size. Pericardium:  There is no evidence of pericardial effusion. Mitral Valve: The mitral valve is normal in structure. Mild mitral annular calcification. No evidence of mitral valve regurgitation. No evidence of mitral valve stenosis. Tricuspid Valve: The tricuspid valve is normal in structure. Tricuspid valve regurgitation is not demonstrated. Aortic Valve: The aortic valve is tricuspid. Aortic valve regurgitation is not visualized. No aortic stenosis is present. Pulmonic Valve: The pulmonic valve was normal in structure. Pulmonic valve regurgitation is not visualized. Aorta: The aortic root is normal in size and structure. Venous: The inferior vena cava is normal in size with greater than 50% respiratory variability, suggesting right atrial pressure of 3 mmHg. IAS/Shunts: No atrial level shunt detected by color flow Doppler.  LEFT VENTRICLE PLAX 2D LVIDd:         4.20 cm   Diastology LVIDs:         3.20 cm   LV e' medial:    6.53 cm/s LV PW:         1.10 cm   LV E/e' medial:  10.2 LV IVS:        1.40 cm   LV e' lateral:   6.74 cm/s LVOT diam:     2.10 cm   LV E/e' lateral: 9.9 LV SV:         66 LV SV Index:   31 LVOT Area:     3.46 cm  RIGHT VENTRICLE RV S prime:     9.14 cm/s TAPSE (M-mode): 1.9 cm LEFT ATRIUM             Index        RIGHT ATRIUM           Index LA diam:        4.20 cm 1.96 cm/m   RA Area:     17.60 cm LA Vol (A2C):   59.5 ml 27.80 ml/m  RA Volume:   49.00 ml  22.89 ml/m LA Vol (A4C):   62.1 ml 29.01 ml/m LA Biplane Vol: 63.5 ml 29.67 ml/m  AORTIC VALVE LVOT Vmax:   79.80 cm/s LVOT Vmean:  61.900 cm/s LVOT VTI:    0.191 m  AORTA Ao Root diam: 3.30 cm Ao Asc diam:  2.80 cm MITRAL VALVE MV Area (PHT): 4.36 cm    SHUNTS MV Decel Time: 174 msec    Systemic VTI:  0.19 m MV E velocity: 66.60 cm/s  Systemic Diam: 2.10 cm MV A velocity: 63.40 cm/s MV E/A ratio:  1.05 [employer] Electronically signed by Freida Busman  McleanMD Signature Date/Time: 11/16/2023/11:22:36 AM    Final    MR BRAIN WO CONTRAST Result  Date: 11/15/2023 CLINICAL DATA:  Stroke, follow up. EXAM: MRI HEAD WITHOUT CONTRAST TECHNIQUE: Multiplanar, multiecho pulse sequences of the brain and surrounding structures were obtained without intravenous contrast. COMPARISON:  Head CT and CTA head/neck 11/15/2023. MRI brain 12/07/2022. FINDINGS: Brain: Acute infarct in the right dorsal pons. No acute hemorrhage or significant mass effect. Stable background of mild cerebral and moderate central pontine chronic small-vessel disease. No hydrocephalus or extra-axial collection. Chronic microhemorrhages in the pons. No mass or midline shift. Vascular: Normal flow voids. Skull and upper cervical spine: Normal marrow signal. Sinuses/Orbits: No acute findings. Other: None. IMPRESSION: Acute infarct in the right dorsal pons. No acute hemorrhage or significant mass effect. Electronically Signed   By: Orvan Falconer M.D.   On: 11/15/2023 16:33   CT ANGIO HEAD NECK W WO CM (CODE STROKE) Result Date: 11/15/2023 CLINICAL DATA:  Neuro deficit, acute, stroke suspected EXAM: CT ANGIOGRAPHY HEAD AND NECK WITH AND WITHOUT CONTRAST TECHNIQUE: Multidetector CT imaging of the head and neck was performed using the standard protocol during bolus administration of intravenous contrast. Multiplanar CT image reconstructions and MIPs were obtained to evaluate the vascular anatomy. Carotid stenosis measurements (when applicable) are obtained utilizing NASCET criteria, using the distal internal carotid diameter as the denominator. RADIATION DOSE REDUCTION: This exam was performed according to the departmental dose-optimization program which includes automated exposure control, adjustment of the mA and/or kV according to patient size and/or use of iterative reconstruction technique. CONTRAST:  75mL OMNIPAQUE IOHEXOL 350 MG/ML SOLN COMPARISON:  Same day CT head. FINDINGS: CTA NECK FINDINGS Aortic arch: Great vessel origins are patent without significant stenosis. Right carotid system: No  evidence of dissection, stenosis (50% or greater), or occlusion. Left carotid system: No evidence of dissection, stenosis (50% or greater), or occlusion. Vertebral arteries: Left dominant. No evidence of dissection, stenosis (50% or greater), or occlusion. Skeleton: No acute abnormality on limited assessment. Other neck: No acute abnormality on limited assessment Upper chest: Visualized lung apices are clear. Review of the MIP images confirms the above findings CTA HEAD FINDINGS Anterior circulation: Severe bilateral intracranial ICA stenosis due to atherosclerosis. This includes both cavernous and paraclinoid ICA stenosis. 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 Otherwise, bilateral MCAs and ACAs are patent without proximal hemodynamically significant stenosis. 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 Basilar artery and bilateral posterior cerebral arteries are patent without proximal hemodynamically significant stenosis. Venous sinuses: As permitted by contrast timing, patent. Review of the MIP images confirms the above findings IMPRESSION: 1. No emergent large vessel occlusion. 2. Severe right greater than left intracranial ICA stenosis. 3. 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 Electronically Signed   By: Feliberto Harts M.D.   On: 11/15/2023 10:04   CT HEAD CODE STROKE WO CONTRAST Result Date: 11/15/2023 CLINICAL DATA:  Code stroke.  Neuro deficit, acute, stroke suspected EXAM: CT HEAD WITHOUT CONTRAST TECHNIQUE: Contiguous axial images were obtained from the base of the skull through the vertex without intravenous contrast. RADIATION DOSE REDUCTION: This exam was performed according to the departmental dose-optimization program which includes automated exposure control, adjustment of the mA and/or kV according to patient size and/or use of iterative reconstruction technique. COMPARISON:  CT head 12/06/2022. FINDINGS:  Brain: No evidence of acute large vascular territory infarction, hemorrhage, hydrocephalus, extra-axial collection or mass lesion/mass effect. Vascular: No hyperdense vessel. Skull: No acute fracture. Sinuses/Orbits:  Clear sinuses.  No acute orbital findings. Other: No mastoid effusions. ASPECTS [facility] Stroke Program Early CT Score) Total score (0-10 with 10 being normal): 10. IMPRESSION: 1. No evidence of acute intracranial abnormality. 2. ASPECTS is 10. Code stroke imaging results were communicated on 11/15/2023 at 9:37 am to provider Dr. Amada Jupiter via secure text paging. Electronically Signed   By: Feliberto Harts M.D.   On: 11/15/2023 09:37   Medications:  anticoagulant sodium citrate      aspirin  81 mg Oral Daily   atorvastatin  80 mg Oral QHS   buPROPion  150 mg Oral Daily   calcium acetate  667 mg Oral BID WC   Chlorhexidine Gluconate Cloth  6 each Topical Q0600   clopidogrel  75 mg Oral Daily   dorzolamide-timolol  1 drop Both Eyes BID   FLUoxetine  20 mg Oral QHS   [START ON 11/17/2023] heparin  2,000 Units Dialysis Once in dialysis   heparin  5,000 Units Subcutaneous Q8H   heparin sodium (porcine)  2,000 Units Intravenous Once   heparin sodium (porcine)  2,000 Units Intravenous Once   insulin aspart  0-5 Units Subcutaneous QHS   insulin aspart  0-6 Units Subcutaneous TID WC   insulin glargine-yfgn  10 Units Subcutaneous q AM   latanoprost  1 drop Left Eye QHS   pantoprazole  40 mg Oral QHS    Dialysis Orders: MWF - [facility] ([address] - [facility] team 4hr, 450/800, EDW 94.8kg, 2K/2.5Ca, AVF, no heparin - Calcitriol 0.46mcg q HD - Mircera q 4 week - Venofer 100mg  x 10 (in middle of this) - 2.5L UFG max per cardiologist  Assessment/Plan: 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 Was out of window for thrombolytics per notes. 2. ESRD: Usual MWF schedule - for HD today 3. HTN/volume:  BP high, UF as tolerated - 2.5L UFG max  per notes. 4. Anemia: Hgb 12, no ESA for now. 5. Secondary hyperparathyroidism:  Ca ok, Phos pending. Continue home meds. 6. T2DM 7. Hx prior CVA: On statin + plavix  Ozzie Hoyle, PA-C 11/16/2023, 11:39 AM  [facility]

## 2023-11-16 NOTE — Care Management Obs Status (Signed)
MEDICARE OBSERVATION STATUS NOTIFICATION   Patient Details  Name: Bobby Sampson MRN: [id number] Date of Birth: 20-Aug-1961   Medicare Observation Status Notification Given:  Yes    Ronny Bacon, RN 11/16/2023, 8:01 AM

## 2023-11-16 NOTE — TOC Initial Note (Signed)
Transition of Care [facility]) - Initial/Assessment Note    Patient Details  Name: Bobby Sampson MRN: [id number] Date of Birth: September 29, 1961  Transition of Care [facility]) CM/SW Contact:    Kermit Balo, RN Phone Number: 11/16/2023, 11:02 AM  Clinical Narrative:                  Pt is from home with his spouse. She works during the daytime. Pt also has an aide 2 hours a day/ 5 days a week.  Pt states he has all needed DME.  Pt manages his own medications and denies any issues.  PCP is at [facility]: Dr Erasmo Leventhal. Current recommendations are for CIR.  TOC following.   Expected Discharge Plan: IP Rehab Facility Barriers to Discharge: Continued Medical Work up   Patient Goals and CMS Choice   CMS Medicare.gov Compare Post Acute Care list provided to:: Patient Choice offered to / list presented to : Patient      Expected Discharge Plan and Services   Discharge Planning Services: CM Consult Post Acute Care Choice: IP Rehab Living arrangements for the past 2 months: Single Family Home                                      Prior Living Arrangements/Services Living arrangements for the past 2 months: Single Family Home Lives with:: Spouse Patient language and need for interpreter reviewed:: Yes Do you feel safe going back to the place where you live?: Yes      Need for Family Participation in Patient Care: Yes (Comment)   Current home services: DME (all DME: walkers/ shower seats/ wheelchairs/ electric scooter) Criminal Activity/Legal Involvement Pertinent to Current Situation/Hospitalization: No - Comment as needed  Activities of Daily Living      Permission Sought/Granted                  Emotional Assessment Appearance:: Appears stated age Attitude/Demeanor/Rapport: Engaged Affect (typically observed): Accepting Orientation: : Oriented to Self, Oriented to Place, Oriented to  Time, Oriented to Situation   Psych Involvement: No (comment)  Admission diagnosis:   Left-sided weakness [R53.1] Stroke-like symptoms [R29.90] Patient Active Problem List   Diagnosis Date Noted   Stroke-like symptoms 11/15/2023   Diarrhea 12/08/2022   Anemia of chronic disease 12/08/2022   Persistent vomiting 12/07/2022   Vertigo 12/06/2022   Pelvic fracture (HCC) 01/26/2022   Closed right hip fracture (HCC) 11/02/2021   Type II diabetes mellitus with renal manifestations (HCC) 11/02/2021   Fall 11/02/2021   ESRD on dialysis (HCC) 11/02/2021   GERD (gastroesophageal reflux disease) 11/02/2021   Depression 11/02/2021   Chronic diastolic CHF (congestive heart failure) (HCC) 11/02/2021   Stroke (HCC) 02/16/2021   CKD (chronic kidney disease), stage IV (HCC)    Sleep apnea    Obesity (BMI 30.0-34.9)    Acute CVA (cerebrovascular accident) (HCC) 02/15/2021   Essential hypertension    Type 2 diabetes mellitus with hyperglycemia, with long-term current use of insulin (HCC)    AKI (acute kidney injury) (HCC)    COVID-19 11/07/2019   PCP:  [facility]:   [facility] [address] [place] - [address] [place]) [place] [address] Phone: [phone] Fax: [phone]     Social Drivers of Health (SDOH) Social History: SDOH Screenings   Food Insecurity: No Food Insecurity (11/15/2023)  Housing: Low Risk  (  11/15/2023)  Transportation Needs: No Transportation Needs (11/15/2023)  Utilities: Not At Risk (11/15/2023)  Financial Resource Strain: Low Risk  (03/04/2022)   Received from [facility], [facility]  Physical Activity: Inactive (04/25/2021)   Received from [facility], [facility]  Social Connections: Moderately Integrated (04/25/2021)   Received from [facility], [facility]  Stress: Stress Concern Present (04/25/2021)   Received from [facility], [facility]  Tobacco Use: Low Risk  (11/15/2023)   SDOH Interventions:     Readmission Risk Interventions     No data to display

## 2023-11-16 NOTE — Consult Note (Signed)
Physical Medicine and Rehabilitation Consult Reason for Consult:left sided weakness.  Referring Physician: Ninetta Lights   HPI: Bobby Sampson is a 62 y.o. male with a history of ESRD on HD, previous CVA, HTN, DM, CHF, OSA/obesity wo presented on 12/15 to the ED with left sided weakness and sensory loss. CT was negative. 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 MRI revealed acute infarct in the right dorsal pons. Permissive HTN allowed. HD ongoing. Hgb A1c 7.8. Pt was up with therapy today and was mod assist for sit-std transfers and mod assist 40' gait with rolling walker. Pt lives with his wife in a one level home with ramped entrance. Pt was mod I PTA and using rolling walker for mobility. He attended the [facility] regularly but has had some falls recently. Wife is a CNA and drives, does iADL's.    Review of Systems  Constitutional:  Negative for fever.  HENT:  Negative for hearing loss.   Eyes:  Positive for blurred vision.  Respiratory: Negative.    Cardiovascular: Negative.   Gastrointestinal: Negative.   Genitourinary: Negative.   Musculoskeletal: Negative.   Skin:  Negative for rash.  Neurological:  Positive for sensory change and focal weakness.  Psychiatric/Behavioral:  Negative for depression.    Past Medical History:  Diagnosis Date   Diabetes mellitus without complication (HCC)    Hypertension    Neuropathy    Renal disorder    Stroke [facility])    2016   Past Surgical History:  Procedure Laterality Date   INTRAMEDULLARY (IM) NAIL INTERTROCHANTERIC Right 11/02/2021   Procedure: INTRAMEDULLARY (IM) NAIL INTERTROCHANTRIC;  Surgeon: Juanell Fairly, MD;  Location: [facility] ORS;  Service: Orthopedics;  Laterality: Right;   TONSILLECTOMY     Family History  Problem Relation Age of Onset   Hypertension Mother    Diabetes Mother    Kidney disease Mother    Social History:  reports that he has never smoked.  He has never used smokeless tobacco. He reports that he does not currently use alcohol. He reports that he does not currently use drugs. Allergies:  Allergies  Allergen Reactions   Liraglutide Nausea And Vomiting    Other reaction(s): Nausea and vomiting, Fatigue   Trulicity [Dulaglutide] Nausea And Vomiting   Medications Prior to Admission  Medication Sig Dispense Refill   amLODipine (NORVASC) 5 MG tablet Take 5 mg by mouth at bedtime.     aspirin EC 81 MG tablet Take 1 tablet (81 mg total) by mouth daily.     atorvastatin (LIPITOR) 80 MG tablet Take 80 mg by mouth at bedtime.     B Complex-C-Zn-Folic Acid (DIALYVITE/ZINC) TABS Take 1 tablet by mouth at bedtime.     buPROPion (WELLBUTRIN XL) 150 MG 24 hr tablet Take 150 mg by mouth daily.     calcium acetate (PHOSLO) 667 MG capsule Take 667 mg by mouth in the morning and at bedtime. Pt gets a 3rd dose at dialysis     dorzolamide-timolol (COSOPT) 2-0.5 % ophthalmic solution Place 1 drop into both eyes 2 (two) times daily.     FLUoxetine (PROZAC) 20 MG capsule Take 20 mg by mouth at bedtime.     insulin glargine (LANTUS SOLOSTAR) 100 UNIT/ML Solostar Pen Inject 16 Units into the skin at bedtime. (Patient taking differently: Inject 20 Units into the skin 2 (two) times daily.) 15 mL 11   latanoprost (XALATAN) 0.005 % ophthalmic solution Place 1 drop into  the left eye at bedtime.     melatonin 3 MG TABS tablet Take 6 mg by mouth at bedtime.     metoprolol succinate (TOPROL-XL) 25 MG 24 hr tablet Take 1 tablet (25 mg total) by mouth daily.     midodrine (PROAMATINE) 2.5 MG tablet Take 2.5 mg by mouth as needed.     multivitamin (RENA-VIT) TABS tablet Take 1 tablet by mouth at bedtime.  0   NOVOLOG FLEXPEN 100 UNIT/ML FlexPen Inject 7 Units into the skin 3 (three) times daily with meals. (Patient taking differently: Inject into the skin 3 (three) times daily with meals. Sliding scale) 15 mL    pantoprazole (PROTONIX) 40 MG tablet Take 1 tablet (40  mg total) by mouth daily. (Patient taking differently: Take 40 mg by mouth at bedtime.) 30 tablet 0    Home: Home Living Family/patient expects to be discharged to:: Private residence Living Arrangements: Children, Spouse/significant other Available Help at Discharge: Family, Available 24 hours/day, Personal care attendant (wife is a Lawyer) Type of Home: House Home Access: Ramped entrance Home Layout: One level Bathroom Shower/Tub: Engineer, manufacturing systems: Handicapped height Home Equipment: Medical laboratory scientific officer - quad, Medical laboratory scientific officer - single point, Rollator (4 wheels), Tub bench, BSC/3in1, Agricultural consultant (2 wheels), Wheelchair - manual, Grab bars - tub/shower  Functional History: Prior Function Prior Level of Function : Independent/Modified Independent Mobility Comments: Using RW for mobility. going to the [facility] for strengthening and walking on treadmill; two falls in past 6 months, had R LE hip/pelvic fracture 2 years ago and feels never really "set" right; has had toe amputations on L foot ADLs Comments: Performing BADLs with exception of socks and shoes (his wife helps with). Wife does IADLs and driving Functional Status:  Mobility: Bed Mobility General bed mobility comments: In recliner upon arrival Transfers Overall transfer level: Needs assistance Equipment used: Rolling walker (2 wheels) Transfers: Sit to/from Stand Sit to Stand: Mod assist, +2 safety/equipment General transfer comment: Mod A for power up and gaining of balance. Cues for pushing at arm rests; performed x 3 during session, less assist needed with subsequent trials Ambulation/Gait Ambulation/Gait assistance: Mod assist, +2 safety/equipment Gait Distance (Feet): 40 Feet (x 2) Assistive device: Rolling walker (2 wheels) Gait Pattern/deviations: Step-to pattern, Step-through pattern, Ataxic, Staggering left, Narrow base of support, Wide base of support General Gait Details: cues for L foot to L due to crossing midline at times with L  then with too wide stance; LOB to L at times needing mod A to correct; adjusted walker height and applied shoes for balance prior to ambulation    ADL: ADL Overall ADL's : Needs assistance/impaired Eating/Feeding: Set up, Sitting Grooming: Oral care, Minimal assistance, Standing Grooming Details (indicate cue type and reason): Min A for standing balance with slight lateral lean to L. Noting decreased FM coordination and in hand manipulation to manipulate tooth brush and toothpaste Upper Body Bathing: Minimal assistance, Sitting Lower Body Bathing: Moderate assistance, Sit to/from stand Upper Body Dressing : Minimal assistance, Sitting Lower Body Dressing: Maximal assistance, Sit to/from stand Lower Body Dressing Details (indicate cue type and reason): Max A for donning shoes and initial assist with dynamic standing balance Toilet Transfer: Moderate assistance, Maximal assistance, +2 for safety/equipment, Ambulation, Rolling walker (2 wheels) (simulated at recliner) Functional mobility during ADLs: Moderate assistance, +2 for physical assistance, +2 for safety/equipment, Rolling walker (2 wheels) General ADL Comments: Pt demonstrating decreased coordination, balance, and strength. Overall, very motivated  Cognition: Cognition Overall Cognitive Status: Within Functional  Limits for tasks assessed Orientation Level: Oriented X4 Cognition Arousal: Alert Behavior During Therapy: WFL for tasks assessed/performed Overall Cognitive Status: Within Functional Limits for tasks assessed General Comments: very engaged and motivated. Aware of deficits and responding well to cues throughout  Blood pressure (!) 161/78, pulse 74, temperature 97.9 F (36.6 C), temperature source Oral, resp. rate 18, weight 98.6 kg, SpO2 96%. Physical Exam Constitutional:      Appearance: He is obese.  HENT:     Head: Normocephalic.     Right Ear: External ear normal.     Left Ear: External ear normal.     Nose:  Nose normal.     Mouth/Throat:     Pharynx: No oropharyngeal exudate or posterior oropharyngeal erythema.  Eyes:     Extraocular Movements: Extraocular movements intact.     Conjunctiva/sclera: Conjunctivae normal.     Pupils: Pupils are equal, round, and reactive to light.  Cardiovascular:     Rate and Rhythm: Normal rate.  Pulmonary:     Effort: Pulmonary effort is normal.  Abdominal:     Palpations: Abdomen is soft.  Musculoskeletal:        General: No swelling or tenderness.     Cervical back: Normal range of motion.  Skin:    General: Skin is warm and dry.  Neurological:     Mental Status: He is alert.     Comments: Alert and oriented x 3. Normal insight and awareness. Intact Memory. Normal language and speech. Cranial nerve exam unremarkable except for mild sensory loss on left face. MMT: RUE 5/5. LUE 4+/5. Minimal left PD. RLE 5/5. LLE 4/5. Decreased FMC with past pointing and poor proprioception. LT and pain are 1/2 on left arm and leg. No abnl resting tone. DTR's 1+. .    Psychiatric:        Mood and Affect: Mood normal.        Behavior: Behavior normal.     Results for orders placed or performed during the hospital encounter of 11/15/23 (from the past 24 hours)  Glucose, capillary     Status: Abnormal   Collection Time: 11/15/23  4:35 PM  Result Value Ref Range   Glucose-Capillary 221 (H) 70 - 99 mg/dL   Comment 1 Notify RN    Comment 2 Document in Chart   Hemoglobin A1c     Status: Abnormal   Collection Time: 11/15/23  7:57 PM  Result Value Ref Range   Hgb A1c MFr Bld 6.5 (H) 4.8 - 5.6 %   Mean Plasma Glucose 139.85 mg/dL  Glucose, capillary     Status: Abnormal   Collection Time: 11/15/23  9:09 PM  Result Value Ref Range   Glucose-Capillary 210 (H) 70 - 99 mg/dL   Comment 1 Notify RN    Comment 2 Document in Chart   Glucose, capillary     Status: Abnormal   Collection Time: 11/16/23  6:04 AM  Result Value Ref Range   Glucose-Capillary 185 (H) 70 - 99 mg/dL    Comment 1 Notify RN    Comment 2 Document in Chart   Hepatitis B surface antigen     Status: None   Collection Time: 11/16/23  6:12 AM  Result Value Ref Range   Hepatitis B Surface Ag NON REACTIVE NON REACTIVE  Basic metabolic panel     Status: Abnormal   Collection Time: 11/16/23  6:13 AM  Result Value Ref Range   Sodium 132 (L) 135 - 145 mmol/L  Potassium 3.8 3.5 - 5.1 mmol/L   Chloride 91 (L) 98 - 111 mmol/L   CO2 27 22 - 32 mmol/L   Glucose, Bld 185 (H) 70 - 99 mg/dL   BUN 54 (H) 8 - 23 mg/dL   Creatinine, Ser 21.30 (H) 0.61 - 1.24 mg/dL   Calcium 8.9 8.9 - 86.5 mg/dL   GFR, Estimated 5 (L) >60 mL/min   Anion gap 14 5 - 15  CBC     Status: Abnormal   Collection Time: 11/16/23  6:13 AM  Result Value Ref Range   WBC 6.8 4.0 - 10.5 K/uL   RBC 4.45 4.22 - 5.81 MIL/uL   Hemoglobin 12.0 (L) 13.0 - 17.0 g/dL   HCT 78.4 (L) 69.6 - 29.5 %   MCV 82.7 80.0 - 100.0 fL   MCH 27.0 26.0 - 34.0 pg   MCHC 32.6 30.0 - 36.0 g/dL   RDW 28.4 (H) 13.2 - 44.0 %   Platelets 271 150 - 400 K/uL   nRBC 0.0 0.0 - 0.2 %  Lipid panel     Status: Abnormal   Collection Time: 11/16/23  6:13 AM  Result Value Ref Range   Cholesterol 114 0 - 200 mg/dL   Triglycerides 102 <725 mg/dL   HDL 38 (L) >36 mg/dL   Total CHOL/HDL Ratio 3.0 RATIO   VLDL 23 0 - 40 mg/dL   LDL Cholesterol 53 0 - 99 mg/dL  Glucose, capillary     Status: Abnormal   Collection Time: 11/16/23 11:31 AM  Result Value Ref Range   Glucose-Capillary 233 (H) 70 - 99 mg/dL   Comment 1 Notify RN    Comment 2 Document in Chart    ECHOCARDIOGRAM COMPLETE Result Date: 11/16/2023    ECHOCARDIOGRAM REPORT   Patient Name:   Stas Blackledge Date of Exam: 11/16/2023 Medical Rec #:  [id number]  Height:       69.0 in Accession #:    [id number] Weight:       217.4 lb Date of Birth:  1960-12-05 BSA:          2.140 m Patient Age:    62 years   BP:           169/77 mmHg Patient Gender: M          HR:           65 bpm. Exam Location:  Inpatient  Procedure: 2D Echo, Color Doppler, Cardiac Doppler and Intracardiac            Opacification Agent Indications:    Stroke i63.9  History:        Patient has prior history of Echocardiogram examinations, most                 recent 12/07/2022. CHF; Risk Factors:Hypertension, Diabetes and                 Sleep Apnea.  Sonographer:    Irving Burton Senior RDCS Referring Phys: Kalman Drape WOLFE IMPRESSIONS  1. Left ventricular ejection fraction, by estimation, is 55 to 60%. The left ventricle has normal function. The left ventricle has no regional wall motion abnormalities. Left ventricular diastolic parameters are consistent with Grade I diastolic dysfunction (impaired relaxation).  2. Right ventricular systolic function is normal. The right ventricular size is normal. Tricuspid regurgitation signal is inadequate for assessing PA pressure.  3. The mitral valve is normal in structure. No evidence of mitral valve regurgitation. No evidence of mitral stenosis.  4.  The aortic valve is tricuspid. Aortic valve regurgitation is not visualized. No aortic stenosis is present.  5. The inferior vena cava is normal in size with greater than 50% respiratory variability, suggesting right atrial pressure of 3 mmHg. FINDINGS  Left Ventricle: Left ventricular ejection fraction, by estimation, is 55 to 60%. The left ventricle has normal function. The left ventricle has no regional wall motion abnormalities. Definity contrast agent was given IV to delineate the left ventricular  endocardial borders. The left ventricular internal cavity size was normal in size. There is no left ventricular hypertrophy. Left ventricular diastolic parameters are consistent with Grade I diastolic dysfunction (impaired relaxation). Right Ventricle: The right ventricular size is normal. No increase in right ventricular wall thickness. Right ventricular systolic function is normal. Tricuspid regurgitation signal is inadequate for assessing PA pressure. Left Atrium: Left  atrial size was normal in size. Right Atrium: Right atrial size was normal in size. Pericardium: There is no evidence of pericardial effusion. Mitral Valve: The mitral valve is normal in structure. Mild mitral annular calcification. No evidence of mitral valve regurgitation. No evidence of mitral valve stenosis. Tricuspid Valve: The tricuspid valve is normal in structure. Tricuspid valve regurgitation is not demonstrated. Aortic Valve: The aortic valve is tricuspid. Aortic valve regurgitation is not visualized. No aortic stenosis is present. Pulmonic Valve: The pulmonic valve was normal in structure. Pulmonic valve regurgitation is not visualized. Aorta: The aortic root is normal in size and structure. Venous: The inferior vena cava is normal in size with greater than 50% respiratory variability, suggesting right atrial pressure of 3 mmHg. IAS/Shunts: No atrial level shunt detected by color flow Doppler.  LEFT VENTRICLE PLAX 2D LVIDd:         4.20 cm   Diastology LVIDs:         3.20 cm   LV e' medial:    6.53 cm/s LV PW:         1.10 cm   LV E/e' medial:  10.2 LV IVS:        1.40 cm   LV e' lateral:   6.74 cm/s LVOT diam:     2.10 cm   LV E/e' lateral: 9.9 LV SV:         66 LV SV Index:   31 LVOT Area:     3.46 cm  RIGHT VENTRICLE RV S prime:     9.14 cm/s TAPSE (M-mode): 1.9 cm LEFT ATRIUM             Index        RIGHT ATRIUM           Index LA diam:        4.20 cm 1.96 cm/m   RA Area:     17.60 cm LA Vol (A2C):   59.5 ml 27.80 ml/m  RA Volume:   49.00 ml  22.89 ml/m LA Vol (A4C):   62.1 ml 29.01 ml/m LA Biplane Vol: 63.5 ml 29.67 ml/m  AORTIC VALVE LVOT Vmax:   79.80 cm/s LVOT Vmean:  61.900 cm/s LVOT VTI:    0.191 m  AORTA Ao Root diam: 3.30 cm Ao Asc diam:  2.80 cm MITRAL VALVE MV Area (PHT): 4.36 cm    SHUNTS MV Decel Time: 174 msec    Systemic VTI:  0.19 m MV E velocity: 66.60 cm/s  Systemic Diam: 2.10 cm MV A velocity: 63.40 cm/s MV E/A ratio:  1.05 Dalton McleanMD Electronically signed by Wilfred Lacy Signature Date/Time: 11/16/2023/11:22:36 AM  Final    MR BRAIN WO CONTRAST Result Date: 11/15/2023 CLINICAL DATA:  Stroke, follow up. EXAM: MRI HEAD WITHOUT CONTRAST TECHNIQUE: Multiplanar, multiecho pulse sequences of the brain and surrounding structures were obtained without intravenous contrast. COMPARISON:  Head CT and CTA head/neck 11/15/2023. MRI brain 12/07/2022. FINDINGS: Brain: Acute infarct in the right dorsal pons. No acute hemorrhage or significant mass effect. Stable background of mild cerebral and moderate central pontine chronic small-vessel disease. No hydrocephalus or extra-axial collection. Chronic microhemorrhages in the pons. No mass or midline shift. Vascular: Normal flow voids. Skull and upper cervical spine: Normal marrow signal. Sinuses/Orbits: No acute findings. Other: None. IMPRESSION: Acute infarct in the right dorsal pons. No acute hemorrhage or significant mass effect. Electronically Signed   By: Orvan Falconer M.D.   On: 11/15/2023 16:33   CT ANGIO HEAD NECK W WO CM (CODE STROKE) Result Date: 11/15/2023 CLINICAL DATA:  Neuro deficit, acute, stroke suspected EXAM: CT ANGIOGRAPHY HEAD AND NECK WITH AND WITHOUT CONTRAST TECHNIQUE: Multidetector CT imaging of the head and neck was performed using the standard protocol during bolus administration of intravenous contrast. Multiplanar CT image reconstructions and MIPs were obtained to evaluate the vascular anatomy. Carotid stenosis measurements (when applicable) are obtained utilizing NASCET criteria, using the distal internal carotid diameter as the denominator. RADIATION DOSE REDUCTION: This exam was performed according to the departmental dose-optimization program which includes automated exposure control, adjustment of the mA and/or kV according to patient size and/or use of iterative reconstruction technique. CONTRAST:  75mL OMNIPAQUE IOHEXOL 350 MG/ML SOLN COMPARISON:  Same day CT head. FINDINGS: CTA NECK FINDINGS  Aortic arch: Great vessel origins are patent without significant stenosis. Right carotid system: No evidence of dissection, stenosis (50% or greater), or occlusion. Left carotid system: No evidence of dissection, stenosis (50% or greater), or occlusion. Vertebral arteries: Left dominant. No evidence of dissection, stenosis (50% or greater), or occlusion. Skeleton: No acute abnormality on limited assessment. Other neck: No acute abnormality on limited assessment Upper chest: Visualized lung apices are clear. Review of the MIP images confirms the above findings CTA HEAD FINDINGS Anterior circulation: Severe bilateral intracranial ICA stenosis due to atherosclerosis. This includes both cavernous and paraclinoid ICA stenosis. 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 Otherwise, bilateral MCAs and ACAs are patent without proximal hemodynamically significant stenosis. 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 Basilar artery and bilateral posterior cerebral arteries are patent without proximal hemodynamically significant stenosis. Venous sinuses: As permitted by contrast timing, patent. Review of the MIP images confirms the above findings IMPRESSION: 1. No emergent large vessel occlusion. 2. Severe right greater than left intracranial ICA stenosis. 3. 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 Electronically Signed   By: Feliberto Harts M.D.   On: 11/15/2023 10:04   CT HEAD CODE STROKE WO CONTRAST Result Date: 11/15/2023 CLINICAL DATA:  Code stroke.  Neuro deficit, acute, stroke suspected EXAM: CT HEAD WITHOUT CONTRAST TECHNIQUE: Contiguous axial images were obtained from the base of the skull through the vertex without intravenous contrast. RADIATION DOSE REDUCTION: This exam was performed according to the departmental dose-optimization program which includes automated exposure control, adjustment of the mA and/or kV according to  patient size and/or use of iterative reconstruction technique. COMPARISON:  CT head 12/06/2022. FINDINGS: Brain: No evidence of acute large vascular territory infarction, hemorrhage, hydrocephalus, extra-axial collection or mass lesion/mass effect. Vascular: No hyperdense vessel. Skull: No acute fracture. Sinuses/Orbits: Clear sinuses.  No acute orbital findings. Other: No  mastoid effusions. ASPECTS [facility] Stroke Program Early CT Score) Total score (0-10 with 10 being normal): 10. IMPRESSION: 1. No evidence of acute intracranial abnormality. 2. ASPECTS is 10. Code stroke imaging results were communicated on 11/15/2023 at 9:37 am to provider Dr. Amada Jupiter via secure text paging. Electronically Signed   By: Feliberto Harts M.D.   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 Yes Does the patient require coordinated care of a physician, rehab nurse, therapy disciplines of PT, OT to address physical and functional deficits in the context of the above medical diagnosis(es)? Yes Addressing deficits in the following areas: balance, endurance, locomotion, strength, transferring, bowel/bladder control, bathing, dressing, feeding, grooming, toileting, and psychosocial support Can the patient actively participate in an intensive therapy program of at least 3 hrs of therapy per day at  least 5 days per week? Yes The potential for patient to make measurable gains while on inpatient rehab is excellent Anticipated functional outcomes upon discharge from inpatient rehab are modified independent  with PT, modified independent with OT, n/a with SLP. Estimated rehab length of stay to reach the above functional goals is: 7-10 days Anticipated discharge destination: Home Overall Rehab/Functional Prognosis: excellent  POST ACUTE RECOMMENDATIONS: This patient's condition is appropriate for continued rehabilitative care in the following setting: CIR Patient has agreed to participate in recommended program. Yes Note that insurance prior authorization may be required for reimbursement for recommended care.  Comment: Rehab Admissions Coordinator to follow up.     I have personally performed a face to face diagnostic evaluation of this patient. Additionally, I have examined the patient's medical record including any pertinent labs and radiographic images. If the physician assistant has documented in this note, I have reviewed and edited or otherwise concur with the physician assistant's documentation.  Thanks,  Ranelle Oyster, MD 11/16/2023

## 2023-11-16 NOTE — Progress Notes (Addendum)
STROKE TEAM PROGRESS NOTE   BRIEF HPI Mr. Bobby Sampson is a 62 y.o. male with history of end-stage renal disease on dialysis, stroke, hypertension, hyperlipidemia, diabetes, CHF, obesity and sleep apnea presenting with acute onset left-sided weakness and numbness.  He was found to have an acute right-sided pontine infarct on MRI.  NIH on Admission 4  INTERIM HISTORY/SUBJECTIVE Patient has been afebrile overnight with stable vital signs.  He reports continuing left-sided weakness and numbness and incoordination. Patient is he has   history of sleep apnea but has not been using his CPAP OBJECTIVE  CBC    Component Value Date/Time   WBC 6.8 11/16/2023 0613   RBC 4.45 11/16/2023 0613   HGB 12.0 (L) 11/16/2023 0613   HCT 36.8 (L) 11/16/2023 0613   PLT 271 11/16/2023 0613   MCV 82.7 11/16/2023 0613   MCH 27.0 11/16/2023 0613   MCHC 32.6 11/16/2023 0613   RDW 16.0 (H) 11/16/2023 0613   LYMPHSABS 1.6 11/15/2023 0953   MONOABS 0.7 11/15/2023 0953   EOSABS 0.2 11/15/2023 0953   BASOSABS 0.1 11/15/2023 0953    BMET    Component Value Date/Time   NA 132 (L) 11/16/2023 0613   K 3.8 11/16/2023 0613   CL 91 (L) 11/16/2023 0613   CO2 27 11/16/2023 0613   GLUCOSE 185 (H) 11/16/2023 0613   BUN 54 (H) 11/16/2023 0613   CREATININE 10.01 (H) 11/16/2023 0613   CALCIUM 8.9 11/16/2023 0613   GFRNONAA 5 (L) 11/16/2023 0613    IMAGING past 24 hours ECHOCARDIOGRAM COMPLETE Result Date: 11/16/2023    ECHOCARDIOGRAM REPORT   Patient Name:   Bobby Sampson Date of Exam: 11/16/2023 Medical Rec #:  [id number]  Height:       69.0 in Accession #:    [id number] Weight:       217.4 lb Date of Birth:  08-15-1961 BSA:          2.140 m Patient Age:    62 years   BP:           169/77 mmHg Patient Gender: M          HR:           65 bpm. Exam Location:  Inpatient Procedure: 2D Echo, Color Doppler, Cardiac Doppler and Intracardiac            Opacification Agent Indications:    Stroke i63.9  History:        Patient  has prior history of Echocardiogram examinations, most                 recent 12/07/2022. CHF; Risk Factors:Hypertension, Diabetes and                 Sleep Apnea.  Sonographer:    Irving Burton Senior RDCS Referring Phys: Kalman Drape WOLFE IMPRESSIONS  1. Left ventricular ejection fraction, by estimation, is 55 to 60%. The left ventricle has normal function. The left ventricle has no regional wall motion abnormalities. Left ventricular diastolic parameters are consistent with Grade I diastolic dysfunction (impaired relaxation).  2. Right ventricular systolic function is normal. The right ventricular size is normal. Tricuspid regurgitation signal is inadequate for assessing PA pressure.  3. The mitral valve is normal in structure. No evidence of mitral valve regurgitation. No evidence of mitral stenosis.  4. The aortic valve is tricuspid. Aortic valve regurgitation is not visualized. No aortic stenosis is present.  5. The inferior vena cava is normal in size with greater than  50% respiratory variability, suggesting right atrial pressure of 3 mmHg. FINDINGS  Left Ventricle: Left ventricular ejection fraction, by estimation, is 55 to 60%. The left ventricle has normal function. The left ventricle has no regional wall motion abnormalities. Definity contrast agent was given IV to delineate the left ventricular  endocardial borders. The left ventricular internal cavity size was normal in size. There is no left ventricular hypertrophy. Left ventricular diastolic parameters are consistent with Grade I diastolic dysfunction (impaired relaxation). Right Ventricle: The right ventricular size is normal. No increase in right ventricular wall thickness. Right ventricular systolic function is normal. Tricuspid regurgitation signal is inadequate for assessing PA pressure. Left Atrium: Left atrial size was normal in size. Right Atrium: Right atrial size was normal in size. Pericardium: There is no evidence of pericardial effusion. Mitral Valve:  The mitral valve is normal in structure. Mild mitral annular calcification. No evidence of mitral valve regurgitation. No evidence of mitral valve stenosis. Tricuspid Valve: The tricuspid valve is normal in structure. Tricuspid valve regurgitation is not demonstrated. Aortic Valve: The aortic valve is tricuspid. Aortic valve regurgitation is not visualized. No aortic stenosis is present. Pulmonic Valve: The pulmonic valve was normal in structure. Pulmonic valve regurgitation is not visualized. Aorta: The aortic root is normal in size and structure. Venous: The inferior vena cava is normal in size with greater than 50% respiratory variability, suggesting right atrial pressure of 3 mmHg. IAS/Shunts: No atrial level shunt detected by color flow Doppler.  LEFT VENTRICLE PLAX 2D LVIDd:         4.20 cm   Diastology LVIDs:         3.20 cm   LV e' medial:    6.53 cm/s LV PW:         1.10 cm   LV E/e' medial:  10.2 LV IVS:        1.40 cm   LV e' lateral:   6.74 cm/s LVOT diam:     2.10 cm   LV E/e' lateral: 9.9 LV SV:         66 LV SV Index:   31 LVOT Area:     3.46 cm  RIGHT VENTRICLE RV S prime:     9.14 cm/s TAPSE (M-mode): 1.9 cm LEFT ATRIUM             Index        RIGHT ATRIUM           Index LA diam:        4.20 cm 1.96 cm/m   RA Area:     17.60 cm LA Vol (A2C):   59.5 ml 27.80 ml/m  RA Volume:   49.00 ml  22.89 ml/m LA Vol (A4C):   62.1 ml 29.01 ml/m LA Biplane Vol: 63.5 ml 29.67 ml/m  AORTIC VALVE LVOT Vmax:   79.80 cm/s LVOT Vmean:  61.900 cm/s LVOT VTI:    0.191 m  AORTA Ao Root diam: 3.30 cm Ao Asc diam:  2.80 cm MITRAL VALVE MV Area (PHT): 4.36 cm    SHUNTS MV Decel Time: 174 msec    Systemic VTI:  0.19 m MV E velocity: 66.60 cm/s  Systemic Diam: 2.10 cm MV A velocity: 63.40 cm/s MV E/A ratio:  1.05 Dalton McleanMD Electronically signed by Wilfred Lacy Signature Date/Time: 11/16/2023/11:22:36 AM    Final    MR BRAIN WO CONTRAST Result Date: 11/15/2023 CLINICAL DATA:  Stroke, follow up. EXAM: MRI  HEAD WITHOUT CONTRAST TECHNIQUE: Multiplanar, multiecho pulse  sequences of the brain and surrounding structures were obtained without intravenous contrast. COMPARISON:  Head CT and CTA head/neck 11/15/2023. MRI brain 12/07/2022. FINDINGS: Brain: Acute infarct in the right dorsal pons. No acute hemorrhage or significant mass effect. Stable background of mild cerebral and moderate central pontine chronic small-vessel disease. No hydrocephalus or extra-axial collection. Chronic microhemorrhages in the pons. No mass or midline shift. Vascular: Normal flow voids. Skull and upper cervical spine: Normal marrow signal. Sinuses/Orbits: No acute findings. Other: None. IMPRESSION: Acute infarct in the right dorsal pons. No acute hemorrhage or significant mass effect. Electronically Signed   By: Orvan Falconer M.D.   On: 11/15/2023 16:33    Vitals:   11/15/23 2337 11/16/23 0400 11/16/23 0756 11/16/23 1130  BP: (!) 168/83 (!) 166/82 (!) 169/77 (!) 161/78  Pulse: 73 75 77 74  Resp: 18 16 18 18   Temp: 97.9 F (36.6 C) 97.9 F (36.6 C) (!) 97.5 F (36.4 C) 97.9 F (36.6 C)  TempSrc: Oral Oral Oral Oral  SpO2: 92% 92% 98% 96%  Weight:         PHYSICAL EXAM General:  Alert, obese pleasant middle-aged African-American male in no acute distress Psych:  Mood and affect appropriate for situation CV: Regular rate and rhythm on monitor Respiratory:  Regular, unlabored respirations on room air GI: Abdomen soft and nontender   NEURO:  Mental Status: AA&Ox3, patient is able to give clear and coherent history Speech/Language: speech is without dysarthria or aphasia.    Cranial Nerves:  II: PERRL. Visual fields full.  III, IV, VI: EOMI. Eyelids elevate symmetrically.  V: Sensation is intact to light touch and symmetrical to face.  VII: Subtle right-sided facial droop VIII: hearing intact to voice. IX, X: Phonation is normal.  XII: tongue is midline without fasciculations. Motor: Able to move all 4  extremities with good antigravity strength, but right side stronger than left. 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  CTA head & neck no LVO, severe right greater than left intracranial ICA stenosis, severe right greater than left vertebral artery stenosis MRI acute infarct in the right dorsal pons 2D Echo EF 55 to 60%, grade 1 diastolic dysfunction, normal left atrial size, no atrial level shunt LDL 53 HgbA1c 6.5 VTE prophylaxis -subcutaneous heparin aspirin 81 mg daily prior to admission, now on aspirin 81 mg daily and clopidogrel 75 mg daily for 3 weeks and then Plavix alone. 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  Hypertension Home meds: Amlodipine 5 mg daily Stable Blood Pressure Goal: BP less than 220/110   Hyperlipidemia Home meds: Atorvastatin 80 mg daily, resumed in hospital LDL 53, goal < 70 Continue statin at discharge  Diabetes type II Controlled Home meds: Insulin glargine 20 units twice daily, NovoLog insulin 7 units 3 times daily with meals HgbA1c 6.5, goal < 7.0 CBGs SSI Recommend close follow-up with PCP for better DM control  End-stage renal disease on dialysis Continue  dialysis on Monday Wednesday Friday schedule Avoid contrast when possible Renally dose medications as appropriate Appreciate nephrology recommendations  Other Stroke Risk  Factors Obesity, Body mass index is 32.1 kg/m., BMI >/= 30 associated with increased stroke risk, recommend weight loss, diet and exercise as appropriate    Other Active Problems None  Hospital day # 0  Patient seen by NP with MD, MD to edit note as needed. Cortney E Ernestina Columbia , MSN, AGACNP-BC [facility] See Amion for schedule and pager information 11/16/2023 12:22 PM   STROKE MD NOTE :  I have personally obtained history,examined this patient, reviewed notes, independently viewed imaging studies, participated in medical decision making and plan of care.ROS completed by me personally and pertinent positives fully documented  I have made any additions or clarifications directly to the above note. Agree with note above.  Patient presented with left-sided numbness weakness and incoordination due to right pontine lacunar infarct likely from small vessel disease.  Recommend aspirin Plavix for 3 weeks followed by Plavix alone and aggressive risk factor modification.  Patient counseled to be compliant with his medications.  Mobilize out of bed.  Therapy consults.  He will likely need inpatient rehab. Patient may also benefit with possible consideration for participation in the sleep smart stroke prevention study.  He was given written information to review and decide.  It was made clear to the patient that study participation is voluntary and he can withdraw at any point if not satisfied.  He will get the same excellent medical care irrespective of whether he participates in the study or not.  He voiced understanding. Greater than 50% time during his 50-minute visit were spent in counseling and coordination of care about his lacunar stroke and discussion about stroke evaluation, prevention and treatment and answering questions Delia Heady, MD Medical Director [facility] Pager: [phone] 11/16/2023 12:40 PM   To contact Stroke Continuity provider, please refer to  [email or web address]. After hours, contact General Neurology

## 2023-11-16 NOTE — Progress Notes (Signed)
Inpatient Rehab Admissions Coordinator:  ? ?Per therapy recommendations,  patient was screened for CIR candidacy by Devaney Segers, MS, CCC-SLP. At this time, Pt. Appears to be a a potential candidate for CIR. I will place   order for rehab consult per protocol for full assessment. Please contact me any with questions. ? ?Trine Fread, MS, CCC-SLP ?Rehab Admissions Coordinator  ?[phone] (celll) ?[phone] (office) ? ?

## 2023-11-16 NOTE — Evaluation (Signed)
Occupational Therapy Evaluation Patient Details Name: Bobby Sampson MRN: [id number] DOB: 1961-09-30 Today's Date: 11/16/2023   History of Present Illness 62 yo male presenting to ED on 12/15 with L sided weakness/numbness. 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 PMH including ESRD on HD MWF, Prior CVA, HTN, HLD, DM, CHF, obesity, OSA.   Clinical Impression   PTA, pt was living with his wife and was independent with BADLs and using RW for functional mobility; aide and wife assist with shoes/socks, IADLs, and driving. Pt currently requiring Min A for UB ADLs, Mod-Max A for LB ADLs, and Mod A +2 for functional mobility. Pt presenting with decreased coordination, body awareness, strength, and balance. Pt highly motivated to participate and return to PLOF. Highly recommend dc to intensive post-acute rehab (>3hrs) to optimize return of function, safety, and independence with ADLs. Will continue to follow acutely as admitted to facilitate safe dc.      If plan is discharge home, recommend the following: A lot of help with walking and/or transfers;A lot of help with bathing/dressing/bathroom    Functional Status Assessment  Patient has had a recent decline in their functional status and demonstrates the ability to make significant improvements in function in a reasonable and predictable amount of time.  Equipment Recommendations  Other (comment) (Defer to next venue)    Recommendations for Other Services PT consult;Rehab consult     Precautions / Restrictions Precautions Precautions: Fall;Other (comment) Precaution Comments: Decreased LE coordination Restrictions Weight Bearing Restrictions Per Provider Order: No      Mobility Bed Mobility               General bed mobility comments: In recliner upon arrival    Transfers Overall transfer level: Needs assistance Equipment used: Rolling walker (2 wheels) Transfers: Sit to/from Stand Sit to Stand: Mod assist, +2  safety/equipment           General transfer comment: Mod A for power up and gaining of balance. Cues for pushing at arm rests      Balance Overall balance assessment: Needs assistance Sitting-balance support: No upper extremity supported, Feet supported Sitting balance-Leahy Scale: Fair     Standing balance support: Bilateral upper extremity supported, During functional activity Standing balance-Leahy Scale: Poor                             ADL either performed or assessed with clinical judgement   ADL Overall ADL's : Needs assistance/impaired Eating/Feeding: Set up;Sitting   Grooming: Oral care;Minimal assistance;Standing Grooming Details (indicate cue type and reason): Min A for standing balance with slight lateral lean to L. Noting decreased FM coordination and in hand manipulation to manipulate tooth brush and toothpaste Upper Body Bathing: Minimal assistance;Sitting   Lower Body Bathing: Moderate assistance;Sit to/from stand   Upper Body Dressing : Minimal assistance;Sitting   Lower Body Dressing: Maximal assistance;Sit to/from stand Lower Body Dressing Details (indicate cue type and reason): Max A for donning shoes and initial assist with dynamic standing balance Toilet Transfer: Moderate assistance;Maximal assistance;+2 for safety/equipment;Ambulation;Rolling walker (2 wheels) (simulated at recliner)           Functional mobility during ADLs: Moderate assistance;+2 for physical assistance;+2 for safety/equipment;Rolling walker (2 wheels) General ADL Comments: Pt demonstrating decreased coordination, balance, and strength. Overall, very motivated     Vision Baseline Vision/History: 5 Retinopathy;1 Wears glasses (R>L better vision) Vision Assessment?: Wears glasses for reading     Perception  Praxis Praxis: Impaired Praxis Impairment Details: Motor planning, Organization     Pertinent Vitals/Pain Pain Assessment Pain Assessment:  Faces Faces Pain Scale: No hurt Pain Intervention(s): Monitored during session     Extremity/Trunk Assessment Upper Extremity Assessment Upper Extremity Assessment: Right hand dominant;RUE deficits/detail;LUE deficits/detail RUE Deficits / Details: 4/5 overall. Decreased FM and in hand manipulation. Chronic shoulder injury from football. RUE Coordination: decreased fine motor;decreased gross motor LUE Deficits / Details: 4/5 overall. Decreased FM and in hand manipulation LUE Coordination: decreased fine motor;decreased gross motor   Lower Extremity Assessment Lower Extremity Assessment: Defer to PT evaluation       Communication Communication Communication: No apparent difficulties   Cognition Arousal: Alert Behavior During Therapy: WFL for tasks assessed/performed Overall Cognitive Status: Within Functional Limits for tasks assessed                                 General Comments: very engaged and motivated. Aware of deficits and responding well to cues throughout     General Comments  HR 68 during activtiy    Exercises     Shoulder Instructions      Home Living Family/patient expects to be discharged to:: Private residence Living Arrangements: Children;Spouse/significant other Available Help at Discharge: Family;Available 24 hours/day;Personal care attendant Type of Home: House Home Access: Ramped entrance     Home Layout: One level     Bathroom Shower/Tub: Chief Strategy Officer: Handicapped height     Home Equipment: Cane - quad;Cane - single point;Rollator (4 wheels);Tub bench;BSC/3in1;Rolling Walker (2 wheels);Wheelchair - manual;Grab bars - tub/shower          Prior Functioning/Environment Prior Level of Function : Independent/Modified Independent             Mobility Comments: Using RW for mobility. going to the [facility] for strengthening and walking on treadmill ADLs Comments: Performing BADLs with exception of socks and  shoes (his wife helps with). Wife does IADLs and driving        OT Problem List: Decreased strength;Decreased range of motion;Decreased activity tolerance;Decreased safety awareness;Decreased knowledge of use of DME or AE;Decreased knowledge of precautions;Decreased coordination;Impaired balance (sitting and/or standing);Impaired UE functional use      OT Treatment/Interventions: Self-care/ADL training;Therapeutic exercise;Energy conservation;DME and/or AE instruction;Therapeutic activities;Patient/family education    OT Goals(Current goals can be found in the care plan section) Acute Rehab OT Goals Patient Stated Goal: Get stronger OT Goal Formulation: With patient Time For Goal Achievement: 11/30/23 Potential to Achieve Goals: Good  OT Frequency: Min 1X/week    Co-evaluation PT/OT/SLP Co-Evaluation/Treatment: Yes Reason for Co-Treatment: To address functional/ADL transfers;For patient/therapist safety   OT goals addressed during session: ADL's and self-care      AM-PAC OT "6 Clicks" Daily Activity     Outcome Measure Help from another person eating meals?: None Help from another person taking care of personal grooming?: A Little Help from another person toileting, which includes using toliet, bedpan, or urinal?: A Lot Help from another person bathing (including washing, rinsing, drying)?: A Lot Help from another person to put on and taking off regular upper body clothing?: A Lot Help from another person to put on and taking off regular lower body clothing?: A Lot 6 Click Score: 15   End of Session Equipment Utilized During Treatment: Rolling walker (2 wheels);Gait belt Nurse Communication: Mobility status  Activity Tolerance: Patient tolerated treatment well Patient left: in chair;with call bell/phone  within reach;with chair alarm set  OT Visit Diagnosis: Unsteadiness on feet (R26.81);Other abnormalities of gait and mobility (R26.89);Muscle weakness (generalized)  (M62.81);Hemiplegia and hemiparesis Hemiplegia - Right/Left: Left Hemiplegia - dominant/non-dominant: Non-Dominant Hemiplegia - caused by: Cerebral infarction                Time: 1610-9604 OT Time Calculation (min): 35 min Charges:  OT General Charges $OT Visit: 1 Visit OT Evaluation $OT Eval Moderate Complexity: 1 Mod  Luka Stohr MSOT, OTR/L Acute Rehab Office: [phone]  Theodoro Grist Zameria Vogl 11/16/2023, 10:36 AM

## 2023-11-16 NOTE — Progress Notes (Signed)
HD#0 SUBJECTIVE:  Patient Summary: Bobby Sampson is a 62 y.o. with a pertinent PMH of 2 previous CVA's, HTN, T2DM with insulin dependence, HFpEF, ESRD on HD (M/W/F), who presented with paraesthesias on his left side, left sided headache, numbness of his mouth, and cramping in his left calf since he woke up between 0730-0800 and was last known well at midnight. In the ED CT showed no acute pathology and his symptoms remained stable. CMP showed slight increase in creatine of 8 from baseline.  He was admitted for stroke work up, where MRI shortly after showed infract of the R dorsal pons.   Overnight Events: Restarted his lantus, normally takes 20 units BID, started 10 mg in the morning.      Interim History: Patient was seen resting comfortably in bed. He states that his paraesthesias on his left side are moderately improved, but not completely resolved. His mouth numbness has resolved and he has been able to eat without difficulty. He still notes cramping in his left calf that has not changed in location, severity, or character. He noted that this is his 3rd stroke and felt like he had been adequately been managing his chronic conditions.    OBJECTIVE:  Vital Signs: Vitals:   11/15/23 2337 11/16/23 0400 11/16/23 0756 11/16/23 1130  BP: (!) 168/83 (!) 166/82 (!) 169/77 (!) 161/78  Pulse: 73 75 77 74  Resp: 18 16 18 18   Temp: 97.9 F (36.6 C) 97.9 F (36.6 C) (!) 97.5 F (36.4 C) 97.9 F (36.6 C)  TempSrc: Oral Oral Oral Oral  SpO2: 92% 92% 98% 96%  Weight:       Supplemental O2: Room Air SpO2: 96 %  Filed Weights   11/15/23 0900  Weight: 98.6 kg     Intake/Output Summary (Last 24 hours) at 11/16/2023 1143 Last data filed at 11/16/2023 0900 Gross per 24 hour  Intake 710 ml  Output 650 ml  Net 60 ml   Net IO Since Admission: 60 mL [11/16/23 1143]  Physical Exam: Physical Exam Vitals and nursing note reviewed.  Constitutional:      General: He is not in acute  distress. HENT:     Head: Normocephalic.  Eyes:     Extraocular Movements: Extraocular movements intact.     Conjunctiva/sclera: Conjunctivae normal.     Pupils: Pupils are equal, round, and reactive to light.  Cardiovascular:     Rate and Rhythm: Normal rate.     Heart sounds: Normal heart sounds.  Pulmonary:     Effort: Pulmonary effort is normal.  Musculoskeletal:     Right lower leg: No edema.     Left lower leg: No edema.     Comments: Strength 5/5 in upper and lower extremities bilaterally.  No swelling of the left leg and no tenderness to palpation.   Skin:    General: Skin is warm and dry.  Neurological:     Mental Status: He is alert.     GCS: GCS eye subscore is 4. GCS verbal subscore is 5. GCS motor subscore is 6.     Motor: No weakness.     Comments: Sensation: Equal on both sides on face. Slightly decreased on left upper and lower extremities. Coordination: Ataxia with FTN and HTS on left side     Patient Lines/Drains/Airways Status     Active Line/Drains/Airways     Name Placement date Placement time Site Days   Peripheral IV 11/15/23 22 G Anterior;Left;Proximal Forearm 11/15/23  0930  Forearm  1   Fistula / Graft Right Upper arm Arteriovenous fistula --  --  Upper arm  --             ASSESSMENT/PLAN:  Assessment: Principal Problem:   Stroke-like symptoms Bobby Sampson is a 63 y.o. person living with a history of 2 CVAs (left pontine infarct 2016 and 2022), HTN, T2DM with insulin dependence, HFpEF, and ESRD with HD MWF who presented with paraesthesias of his left face, upper extremity, and lower extremity and admitted for CVA workup on hospital day 0. CT in ED negative for acute pathology. MRI showed infarction of the right dorsal pons with chronic microhemorrhages in the pons.   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 Improvement of sensation and ataxia on FTN and HTS  still present. CTA head and neck Wo contrast showed severe bilateral intracranial ICA stenosis R>L and R>L intraductal vertebral artery stenosis. Etiology likely thrombotic. Echocardiogram unchanged from most recent in January, estimated ejection fraction 55-60% findings consistent with grade 1 diastolic dysfunction and mild dilation of the left and right atria with no atrial shunt. EKG in ED showed sinus rhythm. LDL 53. HgbA1c 6.5 and patient reports good glycemic control at home. With these results suspicion for embolic etiology decreased. Therapy recommending CIR placement.  - Cardiac monitoring  - Clopidogrel 75 mg - will continue Plavix as monotherapy   - Aspirin 81 mg for 21 days - day 1  - Rehab consult placed for potential CIR admission   # ESRD with HD M/W/F. # T2DM with long-term use of insulin  A1c 6.5.  Last dialysis was on Friday 12/13.  Patient baseline SCr of 6--->8, cannot explain why other than his history of ESRD.  Stable electrolytes otherwise.  Nephrology is consulted, will plan for inpatient dialysis while hospitalized. Diabetes well controled. Home regimen includes, Lantus 20 units BID and NovoLog 7 units with meals. Restarted home lantus at 10 units in the morning today, will reevaluate tomorrow for need to increase back to home regimen of 20 units BID. -Nephrology following, appreciate recs. -Daily BMP - SSI with meals  - Lantus 10 units daily in the morning   -Continue home PhosLo 66 mg 3 times daily.   #HTN:  BP 177/72 and allowing permissive hypertension in setting of suspected stroke. Home medications include amlodipine 2.5 mg and metoprolol succinate 25 mg daily. - Restart home HTN medications tomorrow morning    #HFpEF  No chest pain, worsening SOB, or leg edema. Echo results today consistent with most recent echo in January, estimated EF 55-60% - Holding home metoprolol succinate as stated above, will restart tomorrow morning.      Stable chronic conditions     # Depression:  continue home fluoxetine 20 mg and wellbutrin 150 mg daily.   # Bilateral glaucoma:  continue home latanoprost 0.005% eye drops at bedtime.     Best Practice: Diet: Renal diet IVF: Fluids: None, Rate: None VTE: heparin injection 5,000 Units Start: 11/15/23 1400 Code: Full AB: None Therapy Recs: CIR, DME: walker Family Contact: Wife, called and notified. DISPO: Anticipated discharge in 1-2 days to Rehab pending  rehab consult .  Signature: Ezekiel Slocumb  Medical Student, MS3 Pager: [phone] 11:43 AM, 11/16/2023

## 2023-11-16 NOTE — Progress Notes (Signed)
Pt receives out-pt HD at [facility] on MWF. Will assist as needed.   Melven Sartorius Renal Navigator [phone]

## 2023-11-16 NOTE — Progress Notes (Signed)
Echocardiogram 2D Echocardiogram has been performed.  Warren Lacy Kare Dado RDCS 11/16/2023, 11:21 AM

## 2023-11-16 NOTE — Progress Notes (Signed)
Received patient in bed to unit.  Alert and oriented.  Informed consent signed and in chart.   TX duration:3.5  Patient tolerated well.  Transported back to the room  Alert, without acute distress.  Hand-off given to patient's nurse.   Access used: right AVF Access issues: none  Total UF removed: 2.5L Medication(s) given: none   11/16/23 1723  Vitals  Temp 97.9 F (36.6 C)  Temp Source Oral  BP 98/71  MAP (mmHg) 81  BP Location Left Arm  BP Method Automatic  Patient Position (if appropriate) Lying  Pulse Rate 93  Pulse Rate Source Monitor  ECG Heart Rate 93  Resp 19  Oxygen Therapy  SpO2 99 %  O2 Device Room Air  During Treatment Monitoring  Blood Flow Rate (mL/min) 199 mL/min  Arterial Pressure (mmHg) -422 mmHg  Venous Pressure (mmHg) 48.48 mmHg  TMP (mmHg) 2.42 mmHg  Ultrafiltration Rate (mL/min) 889 mL/min  Dialysate Flow Rate (mL/min) 300 ml/min  Dialysate Potassium Concentration 3  Dialysate Calcium Concentration 2.5  Duration of HD Treatment -hour(s) 3.5 hour(s)  Cumulative Fluid Removed (mL) per Treatment  2500.16  HD Safety Checks Performed Yes  Intra-Hemodialysis Comments Tx completed;Tolerated well  Dialysis Fluid Bolus Normal Saline  Bolus Amount (mL) 300 mL      Sakib Noguez S [facility]

## 2023-11-16 NOTE — Progress Notes (Signed)
SLP Cancellation Note  Patient Details Name: Bobby Sampson MRN: [id number] DOB: June 08, 1961   Cancelled treatment:       Reason Eval/Treat Not Completed: Patient at procedure or test/unavailable (HD). Will continue following.    Gwynneth Aliment, M.A., CF-SLP Speech Language Pathology, Acute Rehabilitation Services  Secure Chat preferred [phone]  11/16/2023, 1:46 PM

## 2023-11-17 ENCOUNTER — Inpatient Hospital Stay (HOSPITAL_COMMUNITY)
Admission: AD | Admit: 2023-11-17 | Discharge: 2023-11-26 | DRG: 056 | Disposition: A | Discharge status: Home / Self Care | Payer: 59 | Source: Intra-hospital | Attending: Physical Medicine & Rehabilitation | Admitting: Physical Medicine & Rehabilitation

## 2023-11-17 ENCOUNTER — Encounter (HOSPITAL_COMMUNITY): Payer: Self-pay | Admitting: Physical Medicine & Rehabilitation

## 2023-11-17 ENCOUNTER — Other Ambulatory Visit: Payer: Self-pay

## 2023-11-17 DIAGNOSIS — K5900 Constipation, unspecified: Secondary | ICD-10-CM | POA: Diagnosis present

## 2023-11-17 DIAGNOSIS — Z794 Long term (current) use of insulin: Secondary | ICD-10-CM

## 2023-11-17 DIAGNOSIS — E119 Type 2 diabetes mellitus without complications: Secondary | ICD-10-CM

## 2023-11-17 DIAGNOSIS — Z992 Dependence on renal dialysis: Secondary | ICD-10-CM

## 2023-11-17 DIAGNOSIS — R2990 Unspecified symptoms and signs involving the nervous system: Secondary | ICD-10-CM | POA: Diagnosis not present

## 2023-11-17 DIAGNOSIS — I10 Essential (primary) hypertension: Secondary | ICD-10-CM | POA: Diagnosis not present

## 2023-11-17 DIAGNOSIS — Z833 Family history of diabetes mellitus: Secondary | ICD-10-CM

## 2023-11-17 DIAGNOSIS — E669 Obesity, unspecified: Secondary | ICD-10-CM | POA: Diagnosis present

## 2023-11-17 DIAGNOSIS — G4733 Obstructive sleep apnea (adult) (pediatric): Secondary | ICD-10-CM | POA: Diagnosis present

## 2023-11-17 DIAGNOSIS — F32A Depression, unspecified: Secondary | ICD-10-CM | POA: Diagnosis present

## 2023-11-17 DIAGNOSIS — I132 Hypertensive heart and chronic kidney disease with heart failure and with stage 5 chronic kidney disease, or end stage renal disease: Secondary | ICD-10-CM | POA: Diagnosis present

## 2023-11-17 DIAGNOSIS — Z7902 Long term (current) use of antithrombotics/antiplatelets: Secondary | ICD-10-CM

## 2023-11-17 DIAGNOSIS — H409 Unspecified glaucoma: Secondary | ICD-10-CM | POA: Diagnosis present

## 2023-11-17 DIAGNOSIS — Z79899 Other long term (current) drug therapy: Secondary | ICD-10-CM

## 2023-11-17 DIAGNOSIS — D631 Anemia in chronic kidney disease: Secondary | ICD-10-CM | POA: Diagnosis present

## 2023-11-17 DIAGNOSIS — I69351 Hemiplegia and hemiparesis following cerebral infarction affecting right dominant side: Secondary | ICD-10-CM | POA: Diagnosis present

## 2023-11-17 DIAGNOSIS — N186 End stage renal disease: Secondary | ICD-10-CM | POA: Diagnosis present

## 2023-11-17 DIAGNOSIS — Z7982 Long term (current) use of aspirin: Secondary | ICD-10-CM | POA: Diagnosis not present

## 2023-11-17 DIAGNOSIS — E11649 Type 2 diabetes mellitus with hypoglycemia without coma: Secondary | ICD-10-CM | POA: Diagnosis present

## 2023-11-17 DIAGNOSIS — I63012 Cerebral infarction due to thrombosis of left vertebral artery: Secondary | ICD-10-CM | POA: Diagnosis not present

## 2023-11-17 DIAGNOSIS — E1140 Type 2 diabetes mellitus with diabetic neuropathy, unspecified: Secondary | ICD-10-CM | POA: Diagnosis present

## 2023-11-17 DIAGNOSIS — I6350 Cerebral infarction due to unspecified occlusion or stenosis of unspecified cerebral artery: Secondary | ICD-10-CM

## 2023-11-17 DIAGNOSIS — E785 Hyperlipidemia, unspecified: Secondary | ICD-10-CM | POA: Diagnosis present

## 2023-11-17 DIAGNOSIS — I5032 Chronic diastolic (congestive) heart failure: Secondary | ICD-10-CM | POA: Diagnosis present

## 2023-11-17 DIAGNOSIS — I951 Orthostatic hypotension: Secondary | ICD-10-CM | POA: Diagnosis present

## 2023-11-17 DIAGNOSIS — Z841 Family history of disorders of kidney and ureter: Secondary | ICD-10-CM | POA: Diagnosis not present

## 2023-11-17 DIAGNOSIS — Z888 Allergy status to other drugs, medicaments and biological substances status: Secondary | ICD-10-CM

## 2023-11-17 DIAGNOSIS — E1142 Type 2 diabetes mellitus with diabetic polyneuropathy: Secondary | ICD-10-CM | POA: Diagnosis not present

## 2023-11-17 DIAGNOSIS — I639 Cerebral infarction, unspecified: Principal | ICD-10-CM | POA: Diagnosis present

## 2023-11-17 DIAGNOSIS — I6322 Cerebral infarction due to unspecified occlusion or stenosis of basilar arteries: Secondary | ICD-10-CM | POA: Diagnosis not present

## 2023-11-17 DIAGNOSIS — Z6834 Body mass index (BMI) 34.0-34.9, adult: Secondary | ICD-10-CM

## 2023-11-17 DIAGNOSIS — N2581 Secondary hyperparathyroidism of renal origin: Secondary | ICD-10-CM | POA: Diagnosis present

## 2023-11-17 DIAGNOSIS — E1122 Type 2 diabetes mellitus with diabetic chronic kidney disease: Secondary | ICD-10-CM | POA: Diagnosis present

## 2023-11-17 DIAGNOSIS — I69354 Hemiplegia and hemiparesis following cerebral infarction affecting left non-dominant side: Principal | ICD-10-CM

## 2023-11-17 DIAGNOSIS — Z8249 Family history of ischemic heart disease and other diseases of the circulatory system: Secondary | ICD-10-CM

## 2023-11-17 DIAGNOSIS — E0843 Diabetes mellitus due to underlying condition with diabetic autonomic (poly)neuropathy: Secondary | ICD-10-CM | POA: Diagnosis not present

## 2023-11-17 DIAGNOSIS — I63019 Cerebral infarction due to thrombosis of unspecified vertebral artery: Secondary | ICD-10-CM | POA: Diagnosis not present

## 2023-11-17 LAB — GLUCOSE, CAPILLARY
Glucose-Capillary: 187 mg/dL — ABNORMAL HIGH (ref 70–99)
Glucose-Capillary: 367 mg/dL — ABNORMAL HIGH (ref 70–99)
Glucose-Capillary: 116 mg/dL — ABNORMAL HIGH (ref 70–99)
Glucose-Capillary: 51 mg/dL — ABNORMAL LOW (ref 70–99)
Glucose-Capillary: 92 mg/dL (ref 70–99)

## 2023-11-17 LAB — HEPATITIS B SURFACE ANTIBODY, QUANTITATIVE: Hep B S AB Quant (Post): 975 m[IU]/mL

## 2023-11-17 MED ORDER — INSULIN GLARGINE-YFGN 100 UNIT/ML ~~LOC~~ SOLN
15.0000 [IU] | Freq: Two times a day (BID) | SUBCUTANEOUS | Status: DC
  Administered 2023-11-17: 15 [IU] via SUBCUTANEOUS
  Filled 2023-11-17 (×2): qty 0.15

## 2023-11-17 MED ORDER — CALCIUM ACETATE (PHOS BINDER) 667 MG PO CAPS
667.0000 mg | ORAL_CAPSULE | Freq: Two times a day (BID) | ORAL | Status: DC
  Administered 2023-11-17 – 2023-11-18 (×2): 667 mg via ORAL
  Filled 2023-11-17 (×2): qty 1

## 2023-11-17 MED ORDER — LIDOCAINE HCL (PF) 1 % IJ SOLN: 5.0000 mL | INTRAMUSCULAR | Status: DC | PRN

## 2023-11-17 MED ORDER — INSULIN ASPART 100 UNIT/ML IJ SOLN
5.0000 [IU] | Freq: Three times a day (TID) | INTRAMUSCULAR | Status: DC
  Administered 2023-11-17 – 2023-11-18 (×3): 5 [IU] via SUBCUTANEOUS

## 2023-11-17 MED ORDER — CLOPIDOGREL BISULFATE 75 MG PO TABS: 75.0000 mg | ORAL_TABLET | Freq: Every day | ORAL | Status: DC

## 2023-11-17 MED ORDER — INSULIN ASPART 100 UNIT/ML IJ SOLN
5.0000 [IU] | Freq: Three times a day (TID) | INTRAMUSCULAR | Status: DC
  Administered 2023-11-17: 5 [IU] via SUBCUTANEOUS

## 2023-11-17 MED ORDER — FLEET ENEMA RE ENEM
1.0000 | ENEMA | Freq: Once | RECTAL | Status: DC | PRN
Start: 2023-11-17 — End: 2023-11-26

## 2023-11-17 MED ORDER — ONDANSETRON HCL 4 MG PO TABS
4.0000 mg | ORAL_TABLET | Freq: Four times a day (QID) | ORAL | Status: DC | PRN
  Administered 2023-11-24: 4 mg via ORAL
  Filled 2023-11-17: qty 1

## 2023-11-17 MED ORDER — CALCITRIOL 0.5 MCG PO CAPS
0.5000 ug | ORAL_CAPSULE | ORAL | Status: DC
  Administered 2023-11-18 – 2023-11-25 (×5): 0.5 ug via ORAL
  Filled 2023-11-17 (×6): qty 1

## 2023-11-17 MED ORDER — METHOCARBAMOL 500 MG PO TABS: 500.0000 mg | ORAL_TABLET | Freq: Four times a day (QID) | ORAL | Status: DC | PRN

## 2023-11-17 MED ORDER — LIDOCAINE-PRILOCAINE 2.5-2.5 % EX CREA
1.0000 | TOPICAL_CREAM | CUTANEOUS | Status: DC | PRN
  Filled 2023-11-17: qty 5

## 2023-11-17 MED ORDER — CLOPIDOGREL BISULFATE 75 MG PO TABS
75.0000 mg | ORAL_TABLET | Freq: Every day | ORAL | Status: DC
  Administered 2023-11-18 – 2023-11-26 (×9): 75 mg via ORAL
  Filled 2023-11-17 (×9): qty 1

## 2023-11-17 MED ORDER — LATANOPROST 0.005 % OP SOLN
1.0000 [drp] | Freq: Every day | OPHTHALMIC | Status: DC
  Administered 2023-11-17 – 2023-11-25 (×9): 1 [drp] via OPHTHALMIC
  Filled 2023-11-17: qty 2.5

## 2023-11-17 MED ORDER — ANTICOAGULANT SODIUM CITRATE 4% (200MG/5ML) IV SOLN
5.0000 mL | Status: DC | PRN
  Filled 2023-11-17: qty 5

## 2023-11-17 MED ORDER — ONDANSETRON HCL 4 MG/2ML IJ SOLN
4.0000 mg | Freq: Four times a day (QID) | INTRAMUSCULAR | Status: DC | PRN
Start: 2023-11-17 — End: 2023-11-26

## 2023-11-17 MED ORDER — HEPARIN SODIUM (PORCINE) 5000 UNIT/ML IJ SOLN
5000.0000 [IU] | Freq: Three times a day (TID) | INTRAMUSCULAR | Status: DC
  Administered 2023-11-17 – 2023-11-25 (×20): 5000 [IU] via SUBCUTANEOUS
  Filled 2023-11-17 (×22): qty 1

## 2023-11-17 MED ORDER — BUPROPION HCL ER (XL) 150 MG PO TB24
150.0000 mg | ORAL_TABLET | Freq: Every day | ORAL | Status: DC
  Administered 2023-11-18 – 2023-11-26 (×9): 150 mg via ORAL
  Filled 2023-11-17 (×9): qty 1

## 2023-11-17 MED ORDER — ALTEPLASE 2 MG IJ SOLR
2.0000 mg | Freq: Once | INTRAMUSCULAR | Status: DC | PRN
  Filled 2023-11-17: qty 2

## 2023-11-17 MED ORDER — ATORVASTATIN CALCIUM 80 MG PO TABS
80.0000 mg | ORAL_TABLET | Freq: Every day | ORAL | Status: DC
  Administered 2023-11-17 – 2023-11-25 (×9): 80 mg via ORAL
  Filled 2023-11-17 (×9): qty 1

## 2023-11-17 MED ORDER — METOPROLOL SUCCINATE ER 25 MG PO TB24
25.0000 mg | ORAL_TABLET | Freq: Every day | ORAL | Status: DC
  Administered 2023-11-17: 25 mg via ORAL
  Filled 2023-11-17: qty 1

## 2023-11-17 MED ORDER — ACETAMINOPHEN 325 MG PO TABS: 325.0000 mg | ORAL_TABLET | ORAL | Status: DC | PRN

## 2023-11-17 MED ORDER — PANTOPRAZOLE SODIUM 40 MG PO TBEC
40.0000 mg | DELAYED_RELEASE_TABLET | Freq: Every day | ORAL | Status: DC
  Administered 2023-11-17 – 2023-11-25 (×9): 40 mg via ORAL
  Filled 2023-11-17 (×9): qty 1

## 2023-11-17 MED ORDER — PENTAFLUOROPROP-TETRAFLUOROETH EX AERO: 1.0000 | INHALATION_SPRAY | CUTANEOUS | Status: DC | PRN

## 2023-11-17 MED ORDER — SORBITOL 70 % SOLN
30.0000 mL | Freq: Every day | Status: DC | PRN
Start: 2023-11-17 — End: 2023-11-26

## 2023-11-17 MED ORDER — DORZOLAMIDE HCL-TIMOLOL MAL 2-0.5 % OP SOLN
1.0000 [drp] | Freq: Two times a day (BID) | OPHTHALMIC | Status: DC
  Administered 2023-11-17 – 2023-11-26 (×18): 1 [drp] via OPHTHALMIC
  Filled 2023-11-17: qty 10

## 2023-11-17 MED ORDER — DIPHENHYDRAMINE HCL 25 MG PO CAPS: 25.0000 mg | ORAL_CAPSULE | Freq: Four times a day (QID) | ORAL | Status: DC | PRN

## 2023-11-17 MED ORDER — ASPIRIN 81 MG PO CHEW
81.0000 mg | CHEWABLE_TABLET | Freq: Every day | ORAL | Status: DC
  Administered 2023-11-18 – 2023-11-26 (×9): 81 mg via ORAL
  Filled 2023-11-17 (×8): qty 1

## 2023-11-17 MED ORDER — POLYETHYLENE GLYCOL 3350 17 G PO PACK
17.0000 g | PACK | Freq: Every day | ORAL | Status: DC | PRN
Start: 2023-11-17 — End: 2023-11-26
  Administered 2023-11-19: 17 g via ORAL
  Filled 2023-11-17: qty 1

## 2023-11-17 MED ORDER — METOPROLOL SUCCINATE ER 25 MG PO TB24
25.0000 mg | ORAL_TABLET | Freq: Every day | ORAL | Status: DC
Start: 2023-11-18 — End: 2023-11-26
  Administered 2023-11-19 – 2023-11-26 (×5): 25 mg via ORAL
  Filled 2023-11-17 (×7): qty 1

## 2023-11-17 MED ORDER — HEPARIN SODIUM (PORCINE) 5000 UNIT/ML IJ SOLN: 5000.0000 [IU] | Freq: Three times a day (TID) | INTRAMUSCULAR | Status: DC

## 2023-11-17 MED ORDER — FLUOXETINE HCL 20 MG PO CAPS
20.0000 mg | ORAL_CAPSULE | Freq: Every day | ORAL | Status: DC
  Administered 2023-11-17 – 2023-11-25 (×9): 20 mg via ORAL
  Filled 2023-11-17 (×9): qty 1

## 2023-11-17 MED ORDER — HEPARIN SODIUM (PORCINE) 1000 UNIT/ML DIALYSIS
1000.0000 [IU] | INTRAMUSCULAR | Status: DC | PRN
  Filled 2023-11-17: qty 1

## 2023-11-17 MED ORDER — INSULIN GLARGINE-YFGN 100 UNIT/ML ~~LOC~~ SOLN
15.0000 [IU] | Freq: Two times a day (BID) | SUBCUTANEOUS | Status: DC
  Administered 2023-11-17 – 2023-11-18 (×2): 15 [IU] via SUBCUTANEOUS
  Filled 2023-11-17 (×3): qty 0.15

## 2023-11-17 MED ORDER — GUAIFENESIN-DM 100-10 MG/5ML PO SYRP: 10.0000 mL | ORAL_SOLUTION | Freq: Four times a day (QID) | ORAL | Status: DC | PRN

## 2023-11-17 MED ORDER — ASPIRIN 81 MG PO CHEW: 81.0000 mg | CHEWABLE_TABLET | Freq: Every day | ORAL | Status: DC

## 2023-11-17 MED ORDER — MELATONIN 5 MG PO TABS: 5.0000 mg | ORAL_TABLET | Freq: Every evening | ORAL | Status: DC | PRN

## 2023-11-17 NOTE — Progress Notes (Signed)
STROKE TEAM PROGRESS NOTE   BRIEF HPI Mr. Bobby Sampson is a 62 y.o. male with history of end-stage renal disease on dialysis, stroke, hypertension, hyperlipidemia, diabetes, CHF, obesity and sleep apnea presenting with acute onset left-sided weakness and numbness.  He was found to have an acute right-sided pontine infarct on MRI.  NIH on Admission 4  INTERIM HISTORY/SUBJECTIVE Patient has been afebrile overnight with stable vital signs.  He reports continuing left-sided weakness and numbness and incoordination but it appears to be improving.. Patient is willing to sign consent to participate in the sleep smart study OBJECTIVE  CBC    Component Value Date/Time   WBC 6.8 11/16/2023 0613   RBC 4.45 11/16/2023 0613   HGB 12.0 (L) 11/16/2023 0613   HCT 36.8 (L) 11/16/2023 0613   PLT 271 11/16/2023 0613   MCV 82.7 11/16/2023 0613   MCH 27.0 11/16/2023 0613   MCHC 32.6 11/16/2023 0613   RDW 16.0 (H) 11/16/2023 0613   LYMPHSABS 1.6 11/15/2023 0953   MONOABS 0.7 11/15/2023 0953   EOSABS 0.2 11/15/2023 0953   BASOSABS 0.1 11/15/2023 0953    BMET    Component Value Date/Time   NA 132 (L) 11/16/2023 0613   K 3.8 11/16/2023 0613   CL 91 (L) 11/16/2023 0613   CO2 27 11/16/2023 0613   GLUCOSE 185 (H) 11/16/2023 0613   BUN 54 (H) 11/16/2023 0613   CREATININE 10.01 (H) 11/16/2023 0613   CALCIUM 8.9 11/16/2023 0613   GFRNONAA 5 (L) 11/16/2023 0613    IMAGING past 24 hours No results found.   Vitals:   11/16/23 2310 11/17/23 0327 11/17/23 0812 11/17/23 1231  BP: 110/62 114/67 (!) 147/70 119/73  Pulse: 90 83 77 79  Resp: 18 17 17 19   Temp: 98.1 F (36.7 C) 98.2 F (36.8 C) 97.8 F (36.6 C) (!) 97.4 F (36.3 C)  TempSrc: Oral Oral Oral Oral  SpO2: 97% 94% 97% 97%  Weight:      Height:         PHYSICAL EXAM General:  Alert, obese pleasant middle-aged African-American male in no acute distress Psych:  Mood and affect appropriate for situation CV: Regular rate and rhythm on  monitor Respiratory:  Regular, unlabored respirations on room air GI: Abdomen soft and nontender   NEURO:  Mental Status: AA&Ox3, patient is able to give clear and coherent history Speech/Language: speech is without dysarthria or aphasia.    Cranial Nerves:  II: PERRL. Visual fields full.  III, IV, VI: EOMI. Eyelids elevate symmetrically.  V: Sensation is intact to light touch and symmetrical to face.  VII: Subtle right-sided facial droop VIII: hearing intact to voice. IX, X: Phonation is normal.  XII: tongue is midline without fasciculations. Motor: Able to move all 4 extremities with good antigravity strength, but right side stronger than left. 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  CTA head & neck no LVO, severe right greater than left intracranial ICA stenosis, severe right greater than left vertebral artery stenosis MRI acute infarct in the right dorsal pons 2D Echo EF 55 to  60%, grade 1 diastolic dysfunction, normal left atrial size, no atrial level shunt LDL 53 HgbA1c 6.5 VTE prophylaxis -subcutaneous heparin aspirin 81 mg daily prior to admission, now on aspirin 81 mg daily and clopidogrel 75 mg daily for 3 weeks and then Plavix alone. 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  Hypertension Home meds: Amlodipine 5 mg daily Stable Blood Pressure Goal: BP  less than 220/110   Hyperlipidemia Home meds: Atorvastatin 80 mg daily, resumed in hospital LDL 53, goal < 70 Continue statin at discharge  Diabetes type II Controlled Home meds: Insulin glargine 20 units twice daily, NovoLog insulin 7 units 3 times daily with meals HgbA1c 6.5, goal < 7.0 CBGs SSI Recommend close follow-up with PCP for better DM control  End-stage renal disease on dialysis Continue dialysis on Monday Wednesday Friday schedule Avoid contrast when possible Renally dose medications as appropriate Appreciate nephrology recommendations  Other Stroke Risk Factors Obesity, Body mass index is 34.61 kg/m., BMI >/= 30 associated with increased stroke risk, recommend weight loss, diet and exercise as appropriate    Other Active Problems None   Patient presented with left-sided numbness weakness and incoordination due to right pontine lacunar infarct likely from small vessel disease.  Recommend aspirin Plavix for 3 weeks followed by Plavix alone and aggressive risk factor modification.  Patient counseled to be compliant with his medications.  Mobilize out of bed.  Therapy consults.  He will likely need inpatient rehab. Patient may also benefit with possible consideration for participation in the sleep smart stroke prevention study.  He was given written information to review and decide.  It was made clear to the patient that study participation is voluntary and he can withdraw at any point if not satisfied.  He will get the same excellent medical care irrespective of whether he participates in the study or not.  He voiced understanding. Greater than 50% time during his 35-minute visit were spent in counseling and coordination of care about his lacunar stroke and discussion about stroke evaluation, prevention and treatment and answering questions Delia Heady, MD Medical Director [facility] Pager: [phone] 11/17/2023 2:52 PM   To contact Stroke Continuity  provider, please refer to [email or web address]. After hours, contact General Neurology

## 2023-11-17 NOTE — Progress Notes (Signed)
[facility] Progress Note   Subjective:   Seen in room - HD went fine yesterday, 2.5L net UF. No dyspnea/CP, just tired in general from dialysis and therapies.  Objective Vitals:   11/16/23 2041 11/16/23 2310 11/17/23 0327 11/17/23 0812  BP: 125/79 110/62 114/67 (!) 147/70  Pulse: 93 90 83 77  Resp: 18 18 17 17   Temp: 98.1 F (36.7 C) 98.1 F (36.7 C) 98.2 F (36.8 C) 97.8 F (36.6 C)  TempSrc: Oral Oral Oral Oral  SpO2: 97% 97% 94% 97%  Weight:      Height:       Physical Exam General: Well appearing man, NAD. Room air Heart: RRR; no murmur Lungs: CTA anteriorly Abdomen: soft Extremities: no LE edema Dialysis Access: RUE AVF +t/b  Additional Objective Labs: Basic Metabolic Panel: Recent Labs  Lab 11/15/23 0953 11/15/23 0958 11/16/23 0613  NA 135 135 132*  K 4.2 4.1 3.8  CL 94* 94* 91*  CO2 27  --  27  GLUCOSE 155* 148* 185*  BUN 40* 37* 54*  CREATININE 8.38* 8.60* 10.01*  CALCIUM 8.6*  --  8.9   Liver Function Tests: Recent Labs  Lab 11/15/23 0953  AST 16  ALT 15  ALKPHOS 101  BILITOT 0.6  PROT 6.0*  ALBUMIN 3.0*   CBC: Recent Labs  Lab 11/15/23 0953 11/15/23 0958 11/16/23 0613  WBC 7.3  --  6.8  NEUTROABS 4.7  --   --   HGB 11.1* 11.9* 12.0*  HCT 37.4* 35.0* 36.8*  MCV 91.2  --  82.7  PLT 224  --  271   Studies/Results: ECHOCARDIOGRAM COMPLETE Result Date: 11/16/2023    ECHOCARDIOGRAM REPORT   Patient Name:   Virgie Damer Date of Exam: 11/16/2023 Medical Rec #:  [id number]  Height:       69.0 in Accession #:    [id number] Weight:       217.4 lb Date of Birth:  01/06/1961 BSA:          2.140 m Patient Age:    62 years   BP:           169/77 mmHg Patient Gender: M          HR:           65 bpm. Exam Location:  Inpatient Procedure: 2D Echo, Color Doppler, Cardiac Doppler and Intracardiac            Opacification Agent Indications:    Stroke i63.9  History:        Patient has prior history of Echocardiogram examinations, most                  recent 12/07/2022. CHF; Risk Factors:Hypertension, Diabetes and                 Sleep Apnea.  Sonographer:    Irving Burton Senior RDCS Referring Phys: Kalman Drape WOLFE IMPRESSIONS  1. Left ventricular ejection fraction, by estimation, is 55 to 60%. The left ventricle has normal function. The left ventricle has no regional wall motion abnormalities. Left ventricular diastolic parameters are consistent with Grade I diastolic dysfunction (impaired relaxation).  2. Right ventricular systolic function is normal. The right ventricular size is normal. Tricuspid regurgitation signal is inadequate for assessing PA pressure.  3. The mitral valve is normal in structure. No evidence of mitral valve regurgitation. No evidence of mitral stenosis.  4. The aortic valve is tricuspid. Aortic valve regurgitation is not visualized. No aortic stenosis  is present.  5. The inferior vena cava is normal in size with greater than 50% respiratory variability, suggesting right atrial pressure of 3 mmHg. FINDINGS  Left Ventricle: Left ventricular ejection fraction, by estimation, is 55 to 60%. The left ventricle has normal function. The left ventricle has no regional wall motion abnormalities. Definity contrast agent was given IV to delineate the left ventricular  endocardial borders. The left ventricular internal cavity size was normal in size. There is no left ventricular hypertrophy. Left ventricular diastolic parameters are consistent with Grade I diastolic dysfunction (impaired relaxation). Right Ventricle: The right ventricular size is normal. No increase in right ventricular wall thickness. Right ventricular systolic function is normal. Tricuspid regurgitation signal is inadequate for assessing PA pressure. Left Atrium: Left atrial size was normal in size. Right Atrium: Right atrial size was normal in size. Pericardium: There is no evidence of pericardial effusion. Mitral Valve: The mitral valve is normal in structure. Mild mitral annular  calcification. No evidence of mitral valve regurgitation. No evidence of mitral valve stenosis. Tricuspid Valve: The tricuspid valve is normal in structure. Tricuspid valve regurgitation is not demonstrated. Aortic Valve: The aortic valve is tricuspid. Aortic valve regurgitation is not visualized. No aortic stenosis is present. Pulmonic Valve: The pulmonic valve was normal in structure. Pulmonic valve regurgitation is not visualized. Aorta: The aortic root is normal in size and structure. Venous: The inferior vena cava is normal in size with greater than 50% respiratory variability, suggesting right atrial pressure of 3 mmHg. IAS/Shunts: No atrial level shunt detected by color flow Doppler.  LEFT VENTRICLE PLAX 2D LVIDd:         4.20 cm   Diastology LVIDs:         3.20 cm   LV e' medial:    6.53 cm/s LV PW:         1.10 cm   LV E/e' medial:  10.2 LV IVS:        1.40 cm   LV e' lateral:   6.74 cm/s LVOT diam:     2.10 cm   LV E/e' lateral: 9.9 LV SV:         66 LV SV Index:   31 LVOT Area:     3.46 cm  RIGHT VENTRICLE RV S prime:     9.14 cm/s TAPSE (M-mode): 1.9 cm LEFT ATRIUM             Index        RIGHT ATRIUM           Index LA diam:        4.20 cm 1.96 cm/m   RA Area:     17.60 cm LA Vol (A2C):   59.5 ml 27.80 ml/m  RA Volume:   49.00 ml  22.89 ml/m LA Vol (A4C):   62.1 ml 29.01 ml/m LA Biplane Vol: 63.5 ml 29.67 ml/m  AORTIC VALVE LVOT Vmax:   79.80 cm/s LVOT Vmean:  61.900 cm/s LVOT VTI:    0.191 m  AORTA Ao Root diam: 3.30 cm Ao Asc diam:  2.80 cm MITRAL VALVE MV Area (PHT): 4.36 cm    SHUNTS MV Decel Time: 174 msec    Systemic VTI:  0.19 m MV E velocity: 66.60 cm/s  Systemic Diam: 2.10 cm MV A velocity: 63.40 cm/s MV E/A ratio:  1.05 Dalton McleanMD Electronically signed by Wilfred Lacy Signature Date/Time: 11/16/2023/11:22:36 AM    Final    MR BRAIN WO CONTRAST Result Date: 11/15/2023 CLINICAL  DATA:  Stroke, follow up. EXAM: MRI HEAD WITHOUT CONTRAST TECHNIQUE: Multiplanar, multiecho pulse  sequences of the brain and surrounding structures were obtained without intravenous contrast. COMPARISON:  Head CT and CTA head/neck 11/15/2023. MRI brain 12/07/2022. FINDINGS: Brain: Acute infarct in the right dorsal pons. No acute hemorrhage or significant mass effect. Stable background of mild cerebral and moderate central pontine chronic small-vessel disease. No hydrocephalus or extra-axial collection. Chronic microhemorrhages in the pons. No mass or midline shift. Vascular: Normal flow voids. Skull and upper cervical spine: Normal marrow signal. Sinuses/Orbits: No acute findings. Other: None. IMPRESSION: Acute infarct in the right dorsal pons. No acute hemorrhage or significant mass effect. Electronically Signed   By: Orvan Falconer M.D.   On: 11/15/2023 16:33   Medications:   aspirin  81 mg Oral Daily   atorvastatin  80 mg Oral QHS   buPROPion  150 mg Oral Daily   calcitRIOL  0.5 mcg Oral Q M,W,F-1800   calcium acetate  667 mg Oral BID WC   Chlorhexidine Gluconate Cloth  6 each Topical Q0600   clopidogrel  75 mg Oral Daily   dorzolamide-timolol  1 drop Both Eyes BID   FLUoxetine  20 mg Oral QHS   heparin  5,000 Units Subcutaneous Q8H   insulin aspart  5 Units Subcutaneous TID WC   insulin glargine-yfgn  15 Units Subcutaneous BID   latanoprost  1 drop Left Eye QHS   metoprolol succinate  25 mg Oral Daily   pantoprazole  40 mg Oral QHS    Dialysis Orders: MWF - [facility] ([address]) - [facility] team 4hr, 450/800, EDW 94.8kg, 2K/2.5Ca, AVF, no heparin - Calcitriol 0.60mcg q HD - Mircera q 4 week - Venofer 100mg  x 10 (in middle of this) - 2.5L UFG max per cardiologist   Assessment/Plan: 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 Was out of window for thrombolytics per notes. 2. ESRD: Usual MWF schedule - next HD tomorrow. 3. HTN/volume:  BP high, UF as tolerated - 2.5L UFG max per notes. 4. Anemia: Hgb 12, no ESA for now. 5. Secondary  hyperparathyroidism:  Ca ok, Phos pending. Continue home meds. 6. T2DM 7. Hx prior CVA: On statin + plavix  Ozzie Hoyle, PA-C 11/17/2023, 9:59 AM  [employer]

## 2023-11-17 NOTE — Progress Notes (Signed)
Inpatient Rehabilitation Admission Medication Review by a Pharmacist  A complete drug regimen review was completed for this patient to identify any potential clinically significant medication issues.  High Risk Drug Classes Is patient taking? Indication by Medication  Antipsychotic No   Anticoagulant Yes Heparin sq-VTE px  Antibiotic No   Opioid No   Antiplatelet Yes Aspirin/Plavix x 18 days, then plavix alone  Hypoglycemics/insulin Yes Insulin aspart and glargine-DM  Vasoactive Medication Yes Metoprolol-HTN  Chemotherapy No   Other Yes Atorvastatin-HLD Miralax, sorbitol-constipation Pantoprazole-GERD Bupropion, prozac-MDD Calcitriol, Phoslo-secondary hyperparathyroidism Diphenhydramine-itching Cosopt, Xalatan-glaucoma Guaifenesin-cough Methocarbamol-spasms Melatonin-sleep Ondansetron-N/V     Type of Medication Issue Identified Description of Issue Recommendation(s)  Drug Interaction(s) (clinically significant)     Duplicate Therapy     Allergy     No Medication Administration End Date     Incorrect Dose     Additional Drug Therapy Needed     Significant med changes from prior encounter (inform family/care partners about these prior to discharge). Amlodipine to be restarted at discharge from acute admit Please consider adding at admission to CIR  Other       Clinically significant medication issues were identified that warrant physician communication and completion of prescribed/recommended actions by midnight of the next day:  No  Name of provider notified for urgent issues identified:   Provider Method of Notification:     Pharmacist comments:   Time spent performing this drug regimen review (minutes):  20   Arabia Nylund A. Jeanella Craze, PharmD, BCPS, FNKF Clinical Pharmacist [place] Please utilize Amion for appropriate phone number to reach the unit pharmacist [facility])  11/17/2023 2:01 PM

## 2023-11-17 NOTE — Progress Notes (Signed)
Inpatient Rehab Admissions Coordinator:    I have insurance approval and a bed available for pt to admit to CIR today. Dr. Thomasene Ripple in agreement.  Will let pt/family and TOC team know.   Estill Dooms, PT, DPT Admissions Coordinator [phone] 11/17/23  11:13 AM

## 2023-11-17 NOTE — TOC Transition Note (Signed)
Transition of Care [facility]) - Discharge Note   Patient Details  Name: Bobby Sampson MRN: [id number] Date of Birth: [birth year]  [facility]) CM/SW Contact:  Kermit Balo, RN Phone Number: 11/17/2023, 10:59 AM   Clinical Narrative:     Pt is discharging to CIR today. CM signing off.   Final next level of care: IP Rehab Facility Barriers to Discharge: No Barriers Identified   Patient Goals and CMS Choice   CMS Medicare.gov Compare Post Acute Care list provided to:: Patient Choice offered to / list presented to : Patient      Discharge Placement                       Discharge Plan and Services Additional resources added to the After Visit Summary for     Discharge Planning Services: CM Consult Post Acute Care Choice: IP Rehab                               Social Drivers of Health (SDOH) Interventions SDOH Screenings   Food Insecurity: No Food Insecurity (11/15/2023)  Housing: Low Risk  (11/15/2023)  Transportation Needs: No Transportation Needs (11/15/2023)  Utilities: Not At Risk (11/15/2023)  Financial Resource Strain: Low Risk  (03/04/2022)   Received from [facility], [facility]  Physical Activity: Inactive (04/25/2021)   Received from [facility], [facility]  Social Connections: Moderately Integrated (04/25/2021)   Received from [facility], [facility]  Stress: Stress Concern Present (04/25/2021)   Received from [facility], [facility]  Tobacco Use: Low Risk  (11/15/2023)     Readmission Risk Interventions     No data to display

## 2023-11-17 NOTE — Progress Notes (Signed)
Physical Therapy Treatment Patient Details Name: Bobby Sampson MRN: [id number] DOB: Jan 10, 1961 Today's Date: 11/17/2023   History of Present Illness 62 yo male presenting to ED on 12/15 with L sided weakness/numbness. 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 PMH including ESRD on HD MWF, Prior CVA, HTN, HLD, DM, CHF, obesity, OSA.    PT Comments  Pt up in chair on arrival and agreeable to session with continued progress towards acute goals. Pt continues to be motivated to progress and demonstrating good recall and carryover for all cues and commands. Pt able to progress gait distance with continues focus on appropriate BOS, safe pace and weight shift to R as pt with continues tendency for L lateral lean and x2 LOB needing mod A to correct. Pt performing standing exercises at EOB for increased LE strength and stability with good tolerance. Current plan remains appropriate to address deficits and maximize functional independence and decrease caregiver burden. Pt continues to benefit from skilled PT services to progress toward functional mobility goals.     If plan is discharge home, recommend the following: Supervision due to cognitive status;A little help with walking and/or transfers;Assistance with cooking/housework;Assist for transportation;A little help with bathing/dressing/bathroom;Help with stairs or ramp for entrance   Can travel by private vehicle        Equipment Recommendations  Other (comment) (TBA)    Recommendations for Other Services       Precautions / Restrictions Precautions Precautions: Fall;Other (comment) Precaution Comments: Decreased LE coordination Restrictions Weight Bearing Restrictions Per Provider Order: No     Mobility  Bed Mobility               General bed mobility comments: In recliner upon arrival    Transfers Overall transfer level: Needs assistance Equipment used: Rolling walker (2 wheels) Transfers: Sit to/from Stand Sit to  Stand: Mod assist, Min assist           General transfer comment: Mod A for power up and gaining of balance. Cues for pushing at arm rests; performed x 3 during session, less assist needed with subsequent trials    Ambulation/Gait Ambulation/Gait assistance: Mod assist Gait Distance (Feet): 72 Feet Assistive device: Rolling walker (2 wheels) Gait Pattern/deviations: Step-to pattern, Step-through pattern, Ataxic, Staggering left, Narrow base of support, Wide base of support Gait velocity: variable     General Gait Details: cues for L foot to L due to crossing midline at times with L then with too wide stance; LOB to L at times needing mod A to correct, cues to slow pace with improved control of stepping   Stairs             Wheelchair Mobility     Tilt Bed    Modified Rankin (Stroke Patients Only) Modified Rankin (Stroke Patients Only) Pre-Morbid Rankin Score: Moderate disability Modified Rankin: Moderately severe disability     Balance Overall balance assessment: Needs assistance Sitting-balance support: No upper extremity supported, Feet supported Sitting balance-Leahy Scale: Fair     Standing balance support: Bilateral upper extremity supported Standing balance-Leahy Scale: Poor Standing balance comment: UE support for balance and A (min A for static, mod A dynamic) due to L lateral lean during standing at sink for oral hygiene                            Cognition Arousal: Alert Behavior During Therapy: WFL for tasks assessed/performed Overall Cognitive Status: Within Functional Limits  for tasks assessed                                 General Comments: very engaged and motivated. Aware of deficits and responding well to cues throughout        Exercises Other Exercises Other Exercises: mini squats x10 Other Exercises: single LE marching x12 ea side, single leg stance x60 seconds on ea side with RW support    General  Comments General comments (skin integrity, edema, etc.): VSS on RA      Pertinent Vitals/Pain Pain Assessment Pain Assessment: No/denies pain Faces Pain Scale: No hurt Pain Intervention(s): Monitored during session    Home Living                          Prior Function            PT Goals (current goals can now be found in the care plan section) Acute Rehab PT Goals Patient Stated Goal: to get stronger and go home PT Goal Formulation: With patient Time For Goal Achievement: 11/30/23 Progress towards PT goals: Progressing toward goals    Frequency    Min 1X/week      PT Plan      Co-evaluation              AM-PAC PT "6 Clicks" Mobility   Outcome Measure  Help needed turning from your back to your side while in a flat bed without using bedrails?: A Little Help needed moving from lying on your back to sitting on the side of a flat bed without using bedrails?: A Lot Help needed moving to and from a bed to a chair (including a wheelchair)?: A Lot Help needed standing up from a chair using your arms (e.g., wheelchair or bedside chair)?: A Lot Help needed to walk in hospital room?: A Lot Help needed climbing 3-5 steps with a railing? : Total 6 Click Score: 12    End of Session Equipment Utilized During Treatment: Gait belt Activity Tolerance: Patient tolerated treatment well Patient left: with call bell/phone within reach;in bed;with bed alarm set;Other (comment) (seated up EOB) Nurse Communication: Mobility status PT Visit Diagnosis: Other abnormalities of gait and mobility (R26.89)     Time: 1610-9604 PT Time Calculation (min) (ACUTE ONLY): 15 min  Charges:    $Gait Training: 8-22 mins PT General Charges $$ ACUTE PT VISIT: 1 Visit                     Naoko Diperna R. PTA Acute Rehabilitation Services Office: [phone]   Catalina Antigua 11/17/2023, 11:39 AM

## 2023-11-17 NOTE — H&P (Signed)
Physical Medicine and Rehabilitation Admission H&P     CC: Functional deficits secondary to acute right-sided pontine infarct    HPI: Bobby Sampson is a 62 year old male who awoke on the morning of 11/15/2023 with left-sided numbness and weakness which is different from his baseline.  He was transported to [facility] emergency department via [facility] who initiated code stroke. 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  Code stroke CT head with no acute process, Aspects 10.  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  MRI revealed acute infarct in the right dorsal pons.  Permissive hypertension allowed.  Loaded with aspirin and Plavix now on aspirin 81 mg daily and Plavix 75 mg daily for 3 weeks followed by Plavix alone.  2D echo with ejection fraction of 55 to 60%, grade 1 diastolic dysfunction.  Patient has a history of hypertension, CHF, diabetes mellitus (hemoglobin A1c = 6.5%), end-stage renal disease on hemodialysis Mondays, Wednesdays and Fridays.  Therapy consults obtained.  Tolerating renal diet with fluid restriction 1200 mL.  Thin liquids.  Patient requires mod assist for ambulation with rolling walker, mod to min assist for sit to stand and mod assist for transfers.  He lives with his wife in a 1 level home with ramped entrance.  He attended the [facility] regularly prior to admission, but has had some falls recently.  His wife is a CNA and she drives, does IADLs.The patient requires inpatient medicine and rehabilitation evaluations and services for ongoing dysfunction secondary to right dorsal pontine infarct. Review of Systems  Constitutional:  Negative for chills and fever.  HENT:  Negative for hearing loss.        Reports intermittent "choking" can swallow pills  Eyes:        Chronic poor vision   Respiratory:  Negative for cough and shortness of breath.   Cardiovascular:  Negative for chest pain and palpitations.  Gastrointestinal:  Positive for nausea. Negative for vomiting.       Intermittent early Am nausea  Genitourinary:  Negative for dysuria and urgency.       Still makes some urine daily  Musculoskeletal:  Negative for back pain and neck pain.  Neurological:  Positive for focal weakness. Negative for dizziness and headaches.  Psychiatric/Behavioral:  Negative for depression. The patient is not nervous/anxious.         Past Medical History:  Diagnosis Date   Diabetes mellitus without complication (HCC)     Hypertension     Neuropathy     Renal disorder     Stroke [facility])      2016             Past Surgical History:  Procedure Laterality Date   INTRAMEDULLARY (IM) NAIL INTERTROCHANTERIC Right 11/02/2021    Procedure: INTRAMEDULLARY (IM) NAIL INTERTROCHANTRIC;  Surgeon: Juanell Fairly, MD;  Location: [facility] ORS;  Service: Orthopedics;  Laterality: Right;   TONSILLECTOMY                 Family History  Problem Relation Age of Onset   Hypertension Mother     Diabetes Mother     Kidney disease Mother          Social History:  reports that he has never smoked. He has never used smokeless tobacco. He reports that he does  not currently use alcohol. He reports that he does not currently use drugs. Allergies:  Allergies       Allergies  Allergen Reactions   Liraglutide Nausea And Vomiting      Other reaction(s): Nausea and vomiting, Fatigue   Trulicity [Dulaglutide] Nausea And Vomiting            Medications Prior to Admission  Medication Sig Dispense Refill   amLODipine (NORVASC) 5 MG tablet Take 5 mg by mouth at bedtime.       aspirin EC 81 MG tablet Take 1 tablet (81 mg total) by mouth daily.       atorvastatin (LIPITOR) 80 MG tablet Take 80 mg by mouth at bedtime.       B Complex-C-Zn-Folic Acid (DIALYVITE/ZINC) TABS Take 1 tablet by mouth at bedtime.        buPROPion (WELLBUTRIN XL) 150 MG 24 hr tablet Take 150 mg by mouth daily.       calcium acetate (PHOSLO) 667 MG capsule Take 667 mg by mouth in the morning and at bedtime. Pt gets a 3rd dose at dialysis       dorzolamide-timolol (COSOPT) 2-0.5 % ophthalmic solution Place 1 drop into both eyes 2 (two) times daily.       FLUoxetine (PROZAC) 20 MG capsule Take 20 mg by mouth at bedtime.       insulin glargine (LANTUS SOLOSTAR) 100 UNIT/ML Solostar Pen Inject 16 Units into the skin at bedtime. (Patient taking differently: Inject 20 Units into the skin 2 (two) times daily.) 15 mL 11   latanoprost (XALATAN) 0.005 % ophthalmic solution Place 1 drop into the left eye at bedtime.       melatonin 3 MG TABS tablet Take 6 mg by mouth at bedtime.       metoprolol succinate (TOPROL-XL) 25 MG 24 hr tablet Take 1 tablet (25 mg total) by mouth daily.       midodrine (PROAMATINE) 2.5 MG tablet Take 2.5 mg by mouth as needed.       multivitamin (RENA-VIT) TABS tablet Take 1 tablet by mouth at bedtime.   0   NOVOLOG FLEXPEN 100 UNIT/ML FlexPen Inject 7 Units into the skin 3 (three) times daily with meals. (Patient taking differently: Inject into the skin 3 (three) times daily with meals. Sliding scale) 15 mL     pantoprazole (PROTONIX) 40 MG tablet Take 1 tablet (40 mg total) by mouth daily. (Patient taking differently: Take 40 mg by mouth at bedtime.) 30 tablet 0              Home: Home Living Family/patient expects to be discharged to:: Private residence Living Arrangements: Spouse/significant other, Children (wife and daughter) Available Help at Discharge: Family, Available 24 hours/day, Personal care attendant (wife is a Lawyer) Type of Home: House Home Access: Ramped entrance Home Layout: One level Bathroom Shower/Tub: Engineer, manufacturing systems: Handicapped height Home Equipment: Medical laboratory scientific officer - quad, Medical laboratory scientific officer - single point, Rollator (4 wheels), Tub bench, BSC/3in1, Agricultural consultant (2 wheels), Wheelchair -  manual, Grab bars - tub/shower   Functional History: Prior Function Prior Level of Function : Independent/Modified Independent Mobility Comments: Using RW for mobility. going to the [facility] for strengthening and walking on treadmill; two falls in past 6 months, had R LE hip/pelvic fracture 2 years ago and feels never really "set" right; has had toe amputations on L foot ADLs Comments: Performing BADLs with exception of socks and shoes (his wife helps with). Wife does IADLs and  driving   Functional Status:  Mobility: Bed Mobility General bed mobility comments: In recliner upon arrival Transfers Overall transfer level: Needs assistance Equipment used: Rolling walker (2 wheels) Transfers: Sit to/from Stand Sit to Stand: Mod assist, +2 safety/equipment General transfer comment: Mod A for power up and gaining of balance. Cues for pushing at arm rests; performed x 3 during session, less assist needed with subsequent trials Ambulation/Gait Ambulation/Gait assistance: Mod assist, +2 safety/equipment Gait Distance (Feet): 40 Feet (x 2) Assistive device: Rolling walker (2 wheels) Gait Pattern/deviations: Step-to pattern, Step-through pattern, Ataxic, Staggering left, Narrow base of support, Wide base of support General Gait Details: cues for L foot to L due to crossing midline at times with L then with too wide stance; LOB to L at times needing mod A to correct; adjusted walker height and applied shoes for balance prior to ambulation   ADL: ADL Overall ADL's : Needs assistance/impaired Eating/Feeding: Set up, Sitting Grooming: Oral care, Minimal assistance, Standing Grooming Details (indicate cue type and reason): Min A for standing balance with slight lateral lean to L. Noting decreased FM coordination and in hand manipulation to manipulate tooth brush and toothpaste Upper Body Bathing: Minimal assistance, Sitting Lower Body Bathing: Moderate assistance, Sit to/from stand Upper Body Dressing :  Minimal assistance, Sitting Lower Body Dressing: Maximal assistance, Sit to/from stand Lower Body Dressing Details (indicate cue type and reason): Max A for donning shoes and initial assist with dynamic standing balance Toilet Transfer: Moderate assistance, Maximal assistance, +2 for safety/equipment, Ambulation, Rolling walker (2 wheels) (simulated at recliner) Functional mobility during ADLs: Moderate assistance, +2 for physical assistance, +2 for safety/equipment, Rolling walker (2 wheels) General ADL Comments: Pt demonstrating decreased coordination, balance, and strength. Overall, very motivated   Cognition: Cognition Overall Cognitive Status: Within Functional Limits for tasks assessed Orientation Level: Oriented X4 Cognition Arousal: Alert Behavior During Therapy: WFL for tasks assessed/performed Overall Cognitive Status: Within Functional Limits for tasks assessed General Comments: very engaged and motivated. Aware of deficits and responding well to cues throughout   Physical Exam: Blood pressure (!) 147/70, pulse 77, temperature 97.8 F (36.6 C), temperature source Oral, resp. rate 17, height 5\' 9"  (1.753 m), weight 106.3 kg, SpO2 97%. Physical Exam Constitutional:      General: He is not in acute distress. HENT:     Head: Normocephalic and atraumatic.     Right Ear: External ear normal.     Left Ear: External ear normal.     Mouth/Throat:     Pharynx: No oropharyngeal exudate or posterior oropharyngeal erythema.  Eyes:     Extraocular Movements: Extraocular movements intact.     Pupils: Pupils are equal, round, and reactive to light.  Cardiovascular:     Rate and Rhythm: Normal rate and regular rhythm.     Heart sounds: No murmur heard.    No gallop.  Pulmonary:     Effort: Pulmonary effort is normal. No respiratory distress.     Breath sounds: Normal breath sounds. No wheezing.  Abdominal:     General: Bowel sounds are normal. There is no distension.      Palpations: Abdomen is soft.     Tenderness: There is no abdominal tenderness.  Musculoskeletal:        General: No swelling or deformity.     Cervical back: Normal range of motion.     Comments: Missing right digits one and two.  Skin:    General: Skin is warm and dry.  Comments: AVF RUE  Neurological:     Mental Status: He is alert and oriented to person, place, and time.     Comments: Alert and oriented x 3. Normal insight and awareness. Intact Memory. Normal language and speech. Cranial nerve exam unremarkable mild left facial sensory loss. Impaired vision at baseline.  MMT: RUE and RLE 5/5 prox to distal. LUE 4+/5 prox to distal with slight pronator drift. Impaired FTN/FMC.  LLE 4+/5.  Sensation 1/2 left arm and leg to light touch and proprioception. DTR's 1+. No abnl resting tone. Pt with good sitting balance.    Psychiatric:        Mood and Affect: Mood normal.        Behavior: Behavior normal.        Lab Results Last 48 Hours        Results for orders placed or performed during the hospital encounter of 11/15/23 (from the past 48 hours)  Glucose, capillary     Status: Abnormal    Collection Time: 11/15/23  4:35 PM  Result Value Ref Range    Glucose-Capillary 221 (H) 70 - 99 mg/dL      Comment: Glucose reference range applies only to samples taken after fasting for at least 8 hours.    Comment 1 Notify RN      Comment 2 Document in Chart    Hemoglobin A1c     Status: Abnormal    Collection Time: 11/15/23  7:57 PM  Result Value Ref Range    Hgb A1c MFr Bld 6.5 (H) 4.8 - 5.6 %      Comment: (NOTE) Pre diabetes:          5.7%-6.4%   Diabetes:              >6.4%   Glycemic control for   <7.0% adults with diabetes      Mean Plasma Glucose 139.85 mg/dL      Comment: Performed at [facility], [address], [place], [place] [address]  Glucose, capillary     Status: Abnormal    Collection Time: 11/15/23  9:09 PM  Result Value Ref Range    Glucose-Capillary 210  (H) 70 - 99 mg/dL      Comment: Glucose reference range applies only to samples taken after fasting for at least 8 hours.    Comment 1 Notify RN      Comment 2 Document in Chart    Glucose, capillary     Status: Abnormal    Collection Time: 11/16/23  6:04 AM  Result Value Ref Range    Glucose-Capillary 185 (H) 70 - 99 mg/dL      Comment: Glucose reference range applies only to samples taken after fasting for at least 8 hours.    Comment 1 Notify RN      Comment 2 Document in Chart    Hepatitis B surface antigen     Status: None    Collection Time: 11/16/23  6:12 AM  Result Value Ref Range    Hepatitis B Surface Ag NON REACTIVE NON REACTIVE      Comment: Performed at [facility], [address], [place], [place] [address]  Hepatitis B surface antibody,quantitative     Status: None    Collection Time: 11/16/23  6:12 AM  Result Value Ref Range    Hep B S AB Quant (Post) 975.0 Immunity>10 mIU/mL      Comment: (NOTE)  Status of Immunity  Anti-HBs Level  ------------------                     -------------- Inconsistent with Immunity                  0.0 - 10.0 Consistent with Immunity                         >10.0 Performed At: [facility] [address] [place], [place] [id number] Jolene Schimke MD NW:[id number]    Basic metabolic panel     Status: Abnormal    Collection Time: 11/16/23  6:13 AM  Result Value Ref Range    Sodium 132 (L) 135 - 145 mmol/L    Potassium 3.8 3.5 - 5.1 mmol/L    Chloride 91 (L) 98 - 111 mmol/L    CO2 27 22 - 32 mmol/L    Glucose, Bld 185 (H) 70 - 99 mg/dL      Comment: Glucose reference range applies only to samples taken after fasting for at least 8 hours.    BUN 54 (H) 8 - 23 mg/dL    Creatinine, Ser 57.84 (H) 0.61 - 1.24 mg/dL    Calcium 8.9 8.9 - 69.6 mg/dL    GFR, Estimated 5 (L) >60 mL/min      Comment: (NOTE) Calculated using the CKD-EPI Creatinine Equation (2021)      Anion gap 14 5 - 15      Comment:  Performed at [facility], [address] [place], [place] [address]  CBC     Status: Abnormal    Collection Time: 11/16/23  6:13 AM  Result Value Ref Range    WBC 6.8 4.0 - 10.5 K/uL    RBC 4.45 4.22 - 5.81 MIL/uL    Hemoglobin 12.0 (L) 13.0 - 17.0 g/dL    HCT 41.3 (L) 24.4 - 52.0 %    MCV 82.7 80.0 - 100.0 fL      Comment: REPEATED TO VERIFY DELTA CHECK NOTED      MCH 27.0 26.0 - 34.0 pg    MCHC 32.6 30.0 - 36.0 g/dL    RDW 01.0 (H) 27.2 - 15.5 %    Platelets 271 150 - 400 K/uL    nRBC 0.0 0.0 - 0.2 %      Comment: Performed at [facility], [address] [place], [place] [address]  Lipid panel     Status: Abnormal    Collection Time: 11/16/23  6:13 AM  Result Value Ref Range    Cholesterol 114 0 - 200 mg/dL    Triglycerides 403 <474 mg/dL    HDL 38 (L) >25 mg/dL    Total CHOL/HDL Ratio 3.0 RATIO    VLDL 23 0 - 40 mg/dL    LDL Cholesterol 53 0 - 99 mg/dL      Comment:        Total Cholesterol/HDL:CHD Risk Coronary Heart Disease Risk Table                     Men   Women  1/2 Average Risk   3.4   3.3  Average Risk       5.0   4.4  2 X Average Risk   9.6   7.1  3 X Average Risk  23.4   11.0        Use the calculated Patient Ratio above and the CHD Risk Table  to determine the patient's CHD Risk.        ATP III CLASSIFICATION (LDL):  <100     mg/dL   Optimal  295-188  mg/dL   Near or Above                    Optimal  130-159  mg/dL   Borderline  416-606  mg/dL   High  >301     mg/dL   Very High Performed at [facility], [address] [place], [place] [address]    Glucose, capillary     Status: Abnormal    Collection Time: 11/16/23 11:31 AM  Result Value Ref Range    Glucose-Capillary 233 (H) 70 - 99 mg/dL      Comment: Glucose reference range applies only to samples taken after fasting for at least 8 hours.    Comment 1 Notify RN      Comment 2 Document in Chart    Glucose, capillary     Status: Abnormal    Collection Time: 11/16/23  6:15  PM  Result Value Ref Range    Glucose-Capillary 144 (H) 70 - 99 mg/dL      Comment: Glucose reference range applies only to samples taken after fasting for at least 8 hours.    Comment 1 Notify RN      Comment 2 Document in Chart    Glucose, capillary     Status: Abnormal    Collection Time: 11/16/23  8:44 PM  Result Value Ref Range    Glucose-Capillary 217 (H) 70 - 99 mg/dL      Comment: Glucose reference range applies only to samples taken after fasting for at least 8 hours.    Comment 1 Notify RN      Comment 2 Document in Chart    Glucose, capillary     Status: Abnormal    Collection Time: 11/17/23  6:06 AM  Result Value Ref Range    Glucose-Capillary 187 (H) 70 - 99 mg/dL      Comment: Glucose reference range applies only to samples taken after fasting for at least 8 hours.    Comment 1 Notify RN      Comment 2 Document in Chart         Imaging Results (Last 48 hours)  ECHOCARDIOGRAM COMPLETE Result Date: 11/16/2023    ECHOCARDIOGRAM REPORT   Patient Name:   Jais Rubi Date of Exam: 11/16/2023 Medical Rec #:  [id number]  Height:       69.0 in Accession #:    [id number] Weight:       217.4 lb Date of Birth:  05/05/1961 BSA:          2.140 m Patient Age:    62 years   BP:           169/77 mmHg Patient Gender: M          HR:           65 bpm. Exam Location:  Inpatient Procedure: 2D Echo, Color Doppler, Cardiac Doppler and Intracardiac            Opacification Agent Indications:    Stroke i63.9  History:        Patient has prior history of Echocardiogram examinations, most                 recent 12/07/2022. CHF; Risk Factors:Hypertension, Diabetes and  Sleep Apnea.  Sonographer:    Irving Burton Senior RDCS Referring Phys: Kalman Drape WOLFE IMPRESSIONS  1. Left ventricular ejection fraction, by estimation, is 55 to 60%. The left ventricle has normal function. The left ventricle has no regional wall motion abnormalities. Left ventricular diastolic parameters are consistent with Grade I  diastolic dysfunction (impaired relaxation).  2. Right ventricular systolic function is normal. The right ventricular size is normal. Tricuspid regurgitation signal is inadequate for assessing PA pressure.  3. The mitral valve is normal in structure. No evidence of mitral valve regurgitation. No evidence of mitral stenosis.  4. The aortic valve is tricuspid. Aortic valve regurgitation is not visualized. No aortic stenosis is present.  5. The inferior vena cava is normal in size with greater than 50% respiratory variability, suggesting right atrial pressure of 3 mmHg. FINDINGS  Left Ventricle: Left ventricular ejection fraction, by estimation, is 55 to 60%. The left ventricle has normal function. The left ventricle has no regional wall motion abnormalities. Definity contrast agent was given IV to delineate the left ventricular  endocardial borders. The left ventricular internal cavity size was normal in size. There is no left ventricular hypertrophy. Left ventricular diastolic parameters are consistent with Grade I diastolic dysfunction (impaired relaxation). Right Ventricle: The right ventricular size is normal. No increase in right ventricular wall thickness. Right ventricular systolic function is normal. Tricuspid regurgitation signal is inadequate for assessing PA pressure. Left Atrium: Left atrial size was normal in size. Right Atrium: Right atrial size was normal in size. Pericardium: There is no evidence of pericardial effusion. Mitral Valve: The mitral valve is normal in structure. Mild mitral annular calcification. No evidence of mitral valve regurgitation. No evidence of mitral valve stenosis. Tricuspid Valve: The tricuspid valve is normal in structure. Tricuspid valve regurgitation is not demonstrated. Aortic Valve: The aortic valve is tricuspid. Aortic valve regurgitation is not visualized. No aortic stenosis is present. Pulmonic Valve: The pulmonic valve was normal in structure. Pulmonic valve  regurgitation is not visualized. Aorta: The aortic root is normal in size and structure. Venous: The inferior vena cava is normal in size with greater than 50% respiratory variability, suggesting right atrial pressure of 3 mmHg. IAS/Shunts: No atrial level shunt detected by color flow Doppler.  LEFT VENTRICLE PLAX 2D LVIDd:         4.20 cm   Diastology LVIDs:         3.20 cm   LV e' medial:    6.53 cm/s LV PW:         1.10 cm   LV E/e' medial:  10.2 LV IVS:        1.40 cm   LV e' lateral:   6.74 cm/s LVOT diam:     2.10 cm   LV E/e' lateral: 9.9 LV SV:         66 LV SV Index:   31 LVOT Area:     3.46 cm  RIGHT VENTRICLE RV S prime:     9.14 cm/s TAPSE (M-mode): 1.9 cm LEFT ATRIUM             Index        RIGHT ATRIUM           Index LA diam:        4.20 cm 1.96 cm/m   RA Area:     17.60 cm LA Vol (A2C):   59.5 ml 27.80 ml/m  RA Volume:   49.00 ml  22.89 ml/m LA Vol (A4C):  62.1 ml 29.01 ml/m LA Biplane Vol: 63.5 ml 29.67 ml/m  AORTIC VALVE LVOT Vmax:   79.80 cm/s LVOT Vmean:  61.900 cm/s LVOT VTI:    0.191 m  AORTA Ao Root diam: 3.30 cm Ao Asc diam:  2.80 cm MITRAL VALVE MV Area (PHT): 4.36 cm    SHUNTS MV Decel Time: 174 msec    Systemic VTI:  0.19 m MV E velocity: 66.60 cm/s  Systemic Diam: 2.10 cm MV A velocity: 63.40 cm/s MV E/A ratio:  1.05 Dalton McleanMD Electronically signed by Wilfred Lacy Signature Date/Time: 11/16/2023/11:22:36 AM    Final     MR BRAIN WO CONTRAST Result Date: 11/15/2023 CLINICAL DATA:  Stroke, follow up. EXAM: MRI HEAD WITHOUT CONTRAST TECHNIQUE: Multiplanar, multiecho pulse sequences of the brain and surrounding structures were obtained without intravenous contrast. COMPARISON:  Head CT and CTA head/neck 11/15/2023. MRI brain 12/07/2022. FINDINGS: Brain: Acute infarct in the right dorsal pons. No acute hemorrhage or significant mass effect. Stable background of mild cerebral and moderate central pontine chronic small-vessel disease. No hydrocephalus or extra-axial  collection. Chronic microhemorrhages in the pons. No mass or midline shift. Vascular: Normal flow voids. Skull and upper cervical spine: Normal marrow signal. Sinuses/Orbits: No acute findings. Other: None. IMPRESSION: Acute infarct in the right dorsal pons. No acute hemorrhage or significant mass effect. Electronically Signed   By: Orvan Falconer M.D.   On: 11/15/2023 16:33           Blood pressure (!) 147/70, pulse 77, temperature 97.8 F (36.6 C), temperature source Oral, resp. rate 17, height 5\' 9"  (1.753 m), weight 106.3 kg, SpO2 97%.   Medical Problem List and Plan: 1. Functional deficits secondary to right dorsal pontine infarct with mild left hemiparesis and impaired left sided light touch and proprioception             -patient may  shower             -ELOS/Goals: 6-9 days, goals mod I with PT and OT   2.  Antithrombotics: -DVT/anticoagulation:  Pharmaceutical: Heparin             -antiplatelet therapy: Aspirin and Plavix for three weeks followed by Plavix alone   3. Pain Management: Tylenol as needed   4. Mood/Behavior/Sleep: LCSW to evaluate and provide emotional support             -continue Wellbutrin XL and Prozac             -antipsychotic agents: n/a   5. Neuropsych/cognition: This patient is capable of making decisions on his own behalf.   6. Skin/Wound Care: Routine skin care checks   7. Fluids/Electrolytes/Nutrition: Strict Is and Os.  Laboratory follow-up per nephrology             -renal diet; 1200 mL fluid restriction   8: Hypertension: monitor TID and prn             -continue Toprol-XL 25 mg daily   9: Hyperlipidemia: continue statin   10: Glaucoma: continue Cosopt and Xalatan   11: DM: CBGs QID, A1c = 6.5% (home regimen: insulin glargine 20 units twice daily, NovoLog insulin 7 units 3 times daily with meals)              -continue Novolog 5 units with meals             -continue Semglee 15 units BID   12: ESRD on HD on M/W/F via RUE AVF              -  on Phoslo BID and Rocaltrol M/W/F             -HD at the end of the day to allow participation in therapies 13: OSA: no CPAP   14: CHF: echo>>55-60%, grade 1 DD -volume status per nephrology/HD   15: Obesity: BMI 34.61     Milinda Antis, PA-C 11/17/2023  I have personally performed a face to face diagnostic evaluation of this patient and formulated the key components of the plan.  Additionally, I have personally reviewed laboratory data, imaging studies, as well as relevant notes and concur with the physician assistant's documentation above.  The patient's status has not changed from the original H&P.  Any changes in documentation from the acute care chart have been noted above.  Ranelle Oyster, MD, [place] Dom

## 2023-11-17 NOTE — H&P (Signed)
Physical Medicine and Rehabilitation Admission H&P   CC: Functional deficits secondary to acute right-sided pontine infarct   HPI: Bobby Sampson is a 62 year old male who awoke on the morning of 11/15/2023 with left-sided numbness and weakness which is different from his baseline.  He was transported to [facility] emergency department via [facility] who initiated code stroke. 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  Code stroke CT head with no acute process, Aspects 10.  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  MRI revealed acute infarct in the right dorsal pons.  Permissive hypertension allowed.  Loaded with aspirin and Plavix now on aspirin 81 mg daily and Plavix 75 mg daily for 3 weeks followed by Plavix alone.  2D echo with ejection fraction of 55 to 60%, grade 1 diastolic dysfunction.  Patient has a history of hypertension, CHF, diabetes mellitus (hemoglobin A1c = 6.5%), end-stage renal disease on hemodialysis Mondays, Wednesdays and Fridays.  Therapy consults obtained.  Tolerating renal diet with fluid restriction 1200 mL.  Thin liquids.  Patient requires mod assist for ambulation with rolling walker, mod to min assist for sit to stand and mod assist for transfers.  He lives with his wife in a 1 level home with ramped entrance.  He attended the [facility] regularly prior to admission, but has had some falls recently.  His wife is a CNA and she drives, does IADLs.The patient requires inpatient medicine and rehabilitation evaluations and services for ongoing dysfunction secondary to right dorsal pontine infarct. Review of Systems  Constitutional:  Negative for chills and fever.  HENT:  Negative for hearing loss.        Reports intermittent "choking" can swallow pills  Eyes:        Chronic poor vision   Respiratory:  Negative for cough and shortness of breath.   Cardiovascular:  Negative for chest pain and palpitations.  Gastrointestinal:  Positive for nausea. Negative for vomiting.       Intermittent early Am nausea  Genitourinary:  Negative for dysuria and urgency.       Still makes some urine daily  Musculoskeletal:  Negative for back pain and neck pain.  Neurological:  Positive for focal weakness. Negative for dizziness and headaches.  Psychiatric/Behavioral:  Negative for depression. The patient is not nervous/anxious.    Past Medical History:  Diagnosis Date   Diabetes mellitus without complication (HCC)    Hypertension    Neuropathy    Renal disorder    Stroke [facility])    2016   Past Surgical History:  Procedure Laterality Date   INTRAMEDULLARY (IM) NAIL INTERTROCHANTERIC Right 11/02/2021   Procedure: INTRAMEDULLARY (IM) NAIL INTERTROCHANTRIC;  Surgeon: Juanell Fairly, MD;  Location: [facility] ORS;  Service: Orthopedics;  Laterality: Right;   TONSILLECTOMY     Family History  Problem Relation Age of Onset   Hypertension Mother    Diabetes Mother    Kidney disease Mother    Social History:  reports that he has never smoked. He has never used smokeless tobacco. He reports that he does not currently use alcohol. He reports that he does not currently use drugs. Allergies:  Allergies  Allergen Reactions   Liraglutide Nausea And Vomiting    Other reaction(s): Nausea and vomiting, Fatigue   Trulicity [Dulaglutide] Nausea And Vomiting   Medications Prior  to Admission  Medication Sig Dispense Refill   amLODipine (NORVASC) 5 MG tablet Take 5 mg by mouth at bedtime.     aspirin EC 81 MG tablet Take 1 tablet (81 mg total) by mouth daily.     atorvastatin (LIPITOR) 80 MG tablet Take 80 mg by mouth at bedtime.     B Complex-C-Zn-Folic Acid (DIALYVITE/ZINC) TABS Take 1 tablet by mouth at bedtime.     buPROPion (WELLBUTRIN XL) 150 MG 24 hr tablet Take 150 mg by mouth daily.      calcium acetate (PHOSLO) 667 MG capsule Take 667 mg by mouth in the morning and at bedtime. Pt gets a 3rd dose at dialysis     dorzolamide-timolol (COSOPT) 2-0.5 % ophthalmic solution Place 1 drop into both eyes 2 (two) times daily.     FLUoxetine (PROZAC) 20 MG capsule Take 20 mg by mouth at bedtime.     insulin glargine (LANTUS SOLOSTAR) 100 UNIT/ML Solostar Pen Inject 16 Units into the skin at bedtime. (Patient taking differently: Inject 20 Units into the skin 2 (two) times daily.) 15 mL 11   latanoprost (XALATAN) 0.005 % ophthalmic solution Place 1 drop into the left eye at bedtime.     melatonin 3 MG TABS tablet Take 6 mg by mouth at bedtime.     metoprolol succinate (TOPROL-XL) 25 MG 24 hr tablet Take 1 tablet (25 mg total) by mouth daily.     midodrine (PROAMATINE) 2.5 MG tablet Take 2.5 mg by mouth as needed.     multivitamin (RENA-VIT) TABS tablet Take 1 tablet by mouth at bedtime.  0   NOVOLOG FLEXPEN 100 UNIT/ML FlexPen Inject 7 Units into the skin 3 (three) times daily with meals. (Patient taking differently: Inject into the skin 3 (three) times daily with meals. Sliding scale) 15 mL    pantoprazole (PROTONIX) 40 MG tablet Take 1 tablet (40 mg total) by mouth daily. (Patient taking differently: Take 40 mg by mouth at bedtime.) 30 tablet 0      Home: Home Living Family/patient expects to be discharged to:: Private residence Living Arrangements: Spouse/significant other, Children (wife and daughter) Available Help at Discharge: Family, Available 24 hours/day, Personal care attendant (wife is a Lawyer) Type of Home: House Home Access: Ramped entrance Home Layout: One level Bathroom Shower/Tub: Engineer, manufacturing systems: Handicapped height Home Equipment: Medical laboratory scientific officer - quad, Medical laboratory scientific officer - single point, Rollator (4 wheels), Tub bench, BSC/3in1, Agricultural consultant (2 wheels), Wheelchair - manual, Grab bars - tub/shower   Functional History: Prior Function Prior Level of Function :  Independent/Modified Independent Mobility Comments: Using RW for mobility. 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 Wife does IADLs and driving  Functional Status:  Mobility: Bed Mobility General bed mobility comments: In recliner upon arrival Transfers Overall transfer level: Needs assistance Equipment used: Rolling walker (2 wheels) Transfers: Sit to/from Stand Sit to Stand: Mod assist, +2 safety/equipment General transfer comment: Mod A for power up and gaining of balance. Cues for pushing at arm rests; performed x 3 during session, less assist needed with subsequent trials Ambulation/Gait Ambulation/Gait assistance: Mod assist, +2 safety/equipment Gait Distance (Feet): 40 Feet (x 2) Assistive device: Rolling walker (2 wheels) Gait Pattern/deviations: Step-to pattern, Step-through pattern, Ataxic, Staggering left,  Narrow base of support, Wide base of support General Gait Details: cues for L foot to L due to crossing midline at times with L then with too wide stance; LOB to L at times needing mod A to correct; adjusted walker height and applied shoes for balance prior to ambulation    ADL: ADL Overall ADL's : Needs assistance/impaired Eating/Feeding: Set up, Sitting Grooming: Oral care, Minimal assistance, Standing Grooming Details (indicate cue type and reason): Min A for standing balance with slight lateral lean to L. Noting decreased FM coordination and in hand manipulation to manipulate tooth brush and toothpaste Upper Body Bathing: Minimal assistance, Sitting Lower Body Bathing: Moderate assistance, Sit to/from stand Upper Body Dressing : Minimal assistance, Sitting Lower Body Dressing: Maximal assistance, Sit to/from stand Lower  Body Dressing Details (indicate cue type and reason): Max A for donning shoes and initial assist with dynamic standing balance Toilet Transfer: Moderate assistance, Maximal assistance, +2 for safety/equipment, Ambulation, Rolling walker (2 wheels) (simulated at recliner) Functional mobility during ADLs: Moderate assistance, +2 for physical assistance, +2 for safety/equipment, Rolling walker (2 wheels) General ADL Comments: Pt demonstrating decreased coordination, balance, and strength. Overall, very motivated  Cognition: Cognition Overall Cognitive Status: Within Functional Limits for tasks assessed Orientation Level: Oriented X4 Cognition Arousal: Alert Behavior During Therapy: WFL for tasks assessed/performed Overall Cognitive Status: Within Functional Limits for tasks assessed General Comments: very engaged and motivated. Aware of deficits and responding well to cues throughout  Physical Exam: Blood pressure (!) 147/70, pulse 77, temperature 97.8 F (36.6 C), temperature source Oral, resp. rate 17, height 5\' 9"  (1.753 m), weight 106.3 kg, SpO2 97%. Physical Exam Constitutional:      General: He is not in acute distress. HENT:     Head: Normocephalic and atraumatic.     Right Ear: External ear normal.     Left Ear: External ear normal.     Mouth/Throat:     Pharynx: No oropharyngeal exudate or posterior oropharyngeal erythema.  Eyes:     Extraocular Movements: Extraocular movements intact.     Pupils: Pupils are equal, round, and reactive to light.  Cardiovascular:     Rate and Rhythm: Normal rate and regular rhythm.     Heart sounds: No murmur heard.    No gallop.  Pulmonary:     Effort: Pulmonary effort is normal. No respiratory distress.     Breath sounds: Normal breath sounds. No wheezing.  Abdominal:     General: Bowel sounds are normal. There is no distension.     Palpations: Abdomen is soft.     Tenderness: There is no abdominal tenderness.  Musculoskeletal:         General: No swelling or deformity.     Cervical back: Normal range of motion.     Comments: Missing right digits one and two.  Skin:    General: Skin is warm and dry.     Comments: AVF RUE  Neurological:     Mental Status: He is alert and oriented to person, place, and time.     Comments: Alert and oriented x 3. Normal insight and awareness. Intact Memory. Normal language and speech. Cranial nerve exam unremarkable mild left facial sensory loss. Impaired vision at baseline.  MMT: RUE and RLE 5/5 prox to distal. LUE 4+/5 prox to distal with slight pronator drift. Impaired FTN/FMC.  LLE 4+/5.  Sensation 1/2 left arm and leg to light touch and proprioception. DTR's 1+. No abnl resting tone. Pt with good sitting  balance.    Psychiatric:        Mood and Affect: Mood normal.        Behavior: Behavior normal.     Results for orders placed or performed during the hospital encounter of 11/15/23 (from the past 48 hours)  Glucose, capillary     Status: Abnormal   Collection Time: 11/15/23  4:35 PM  Result Value Ref Range   Glucose-Capillary 221 (H) 70 - 99 mg/dL    Comment: Glucose reference range applies only to samples taken after fasting for at least 8 hours.   Comment 1 Notify RN    Comment 2 Document in Chart   Hemoglobin A1c     Status: Abnormal   Collection Time: 11/15/23  7:57 PM  Result Value Ref Range   Hgb A1c MFr Bld 6.5 (H) 4.8 - 5.6 %    Comment: (NOTE) Pre diabetes:          5.7%-6.4%  Diabetes:              >6.4%  Glycemic control for   <7.0% adults with diabetes    Mean Plasma Glucose 139.85 mg/dL    Comment: Performed at [facility], [address], [place], [place] [address]  Glucose, capillary     Status: Abnormal   Collection Time: 11/15/23  9:09 PM  Result Value Ref Range   Glucose-Capillary 210 (H) 70 - 99 mg/dL    Comment: Glucose reference range applies only to samples taken after fasting for at least 8 hours.   Comment 1 Notify RN    Comment 2 Document  in Chart   Glucose, capillary     Status: Abnormal   Collection Time: 11/16/23  6:04 AM  Result Value Ref Range   Glucose-Capillary 185 (H) 70 - 99 mg/dL    Comment: Glucose reference range applies only to samples taken after fasting for at least 8 hours.   Comment 1 Notify RN    Comment 2 Document in Chart   Hepatitis B surface antigen     Status: None   Collection Time: 11/16/23  6:12 AM  Result Value Ref Range   Hepatitis B Surface Ag NON REACTIVE NON REACTIVE    Comment: Performed at [facility], [address], [place], [place] [address]  Hepatitis B surface antibody,quantitative     Status: None   Collection Time: 11/16/23  6:12 AM  Result Value Ref Range   Hep B S AB Quant (Post) 975.0 Immunity>10 mIU/mL    Comment: (NOTE)  Status of Immunity                     Anti-HBs Level  ------------------                     -------------- Inconsistent with Immunity                  0.0 - 10.0 Consistent with Immunity                         >10.0 Performed At: [facility] [address] [place], [place] [id number] Jolene Schimke MD WG:[id number]   Basic metabolic panel     Status: Abnormal   Collection Time: 11/16/23  6:13 AM  Result Value Ref Range   Sodium 132 (L) 135 - 145 mmol/L   Potassium 3.8 3.5 - 5.1 mmol/L   Chloride 91 (L) 98 -  111 mmol/L   CO2 27 22 - 32 mmol/L   Glucose, Bld 185 (H) 70 - 99 mg/dL    Comment: Glucose reference range applies only to samples taken after fasting for at least 8 hours.   BUN 54 (H) 8 - 23 mg/dL   Creatinine, Ser 60.45 (H) 0.61 - 1.24 mg/dL   Calcium 8.9 8.9 - 40.9 mg/dL   GFR, Estimated 5 (L) >60 mL/min    Comment: (NOTE) Calculated using the CKD-EPI Creatinine Equation (2021)    Anion gap 14 5 - 15    Comment: Performed at [facility], [address] [place], [place] [address]  CBC     Status: Abnormal   Collection Time: 11/16/23  6:13 AM  Result Value Ref Range   WBC 6.8 4.0 - 10.5 K/uL   RBC 4.45 4.22 -  5.81 MIL/uL   Hemoglobin 12.0 (L) 13.0 - 17.0 g/dL   HCT 47.8 (L) 29.5 - 62.1 %   MCV 82.7 80.0 - 100.0 fL    Comment: REPEATED TO VERIFY DELTA CHECK NOTED    MCH 27.0 26.0 - 34.0 pg   MCHC 32.6 30.0 - 36.0 g/dL   RDW 30.8 (H) 65.7 - 84.6 %   Platelets 271 150 - 400 K/uL   nRBC 0.0 0.0 - 0.2 %    Comment: Performed at [facility], [address] [place], [place] [address]  Lipid panel     Status: Abnormal   Collection Time: 11/16/23  6:13 AM  Result Value Ref Range   Cholesterol 114 0 - 200 mg/dL   Triglycerides 284 <132 mg/dL   HDL 38 (L) >44 mg/dL   Total CHOL/HDL Ratio 3.0 RATIO   VLDL 23 0 - 40 mg/dL   LDL Cholesterol 53 0 - 99 mg/dL    Comment:        Total Cholesterol/HDL:CHD Risk Coronary Heart Disease Risk Table                     Men   Women  1/2 Average Risk   3.4   3.3  Average Risk       5.0   4.4  2 X Average Risk   9.6   7.1  3 X Average Risk  23.4   11.0        Use the calculated Patient Ratio above and the CHD Risk Table to determine the patient's CHD Risk.        ATP III CLASSIFICATION (LDL):  <100     mg/dL   Optimal  010-272  mg/dL   Near or Above                    Optimal  130-159  mg/dL   Borderline  536-644  mg/dL   High  >034     mg/dL   Very High Performed at [facility], [address], [place], [place] [address]   Glucose, capillary     Status: Abnormal   Collection Time: 11/16/23 11:31 AM  Result Value Ref Range   Glucose-Capillary 233 (H) 70 - 99 mg/dL    Comment: Glucose reference range applies only to samples taken after fasting for at least 8 hours.   Comment 1 Notify RN    Comment 2 Document in Chart   Glucose, capillary     Status: Abnormal   Collection Time: 11/16/23  6:15 PM  Result Value Ref Range  Glucose-Capillary 144 (H) 70 - 99 mg/dL    Comment: Glucose reference range applies only to samples taken after fasting for at least 8 hours.   Comment 1 Notify RN    Comment 2 Document in Chart   Glucose,  capillary     Status: Abnormal   Collection Time: 11/16/23  8:44 PM  Result Value Ref Range   Glucose-Capillary 217 (H) 70 - 99 mg/dL    Comment: Glucose reference range applies only to samples taken after fasting for at least 8 hours.   Comment 1 Notify RN    Comment 2 Document in Chart   Glucose, capillary     Status: Abnormal   Collection Time: 11/17/23  6:06 AM  Result Value Ref Range   Glucose-Capillary 187 (H) 70 - 99 mg/dL    Comment: Glucose reference range applies only to samples taken after fasting for at least 8 hours.   Comment 1 Notify RN    Comment 2 Document in Chart    ECHOCARDIOGRAM COMPLETE Result Date: 11/16/2023    ECHOCARDIOGRAM REPORT   Patient Name:   Moishe Larcher Date of Exam: 11/16/2023 Medical Rec #:  [id number]  Height:       69.0 in Accession #:    [id number] Weight:       217.4 lb Date of Birth:  27-Jan-1961 BSA:          2.140 m Patient Age:    62 years   BP:           169/77 mmHg Patient Gender: M          HR:           65 bpm. Exam Location:  Inpatient Procedure: 2D Echo, Color Doppler, Cardiac Doppler and Intracardiac            Opacification Agent Indications:    Stroke i63.9  History:        Patient has prior history of Echocardiogram examinations, most                 recent 12/07/2022. CHF; Risk Factors:Hypertension, Diabetes and                 Sleep Apnea.  Sonographer:    Irving Burton Senior RDCS Referring Phys: Kalman Drape WOLFE IMPRESSIONS  1. Left ventricular ejection fraction, by estimation, is 55 to 60%. The left ventricle has normal function. The left ventricle has no regional wall motion abnormalities. Left ventricular diastolic parameters are consistent with Grade I diastolic dysfunction (impaired relaxation).  2. Right ventricular systolic function is normal. The right ventricular size is normal. Tricuspid regurgitation signal is inadequate for assessing PA pressure.  3. The mitral valve is normal in structure. No evidence of mitral valve regurgitation. No evidence  of mitral stenosis.  4. The aortic valve is tricuspid. Aortic valve regurgitation is not visualized. No aortic stenosis is present.  5. The inferior vena cava is normal in size with greater than 50% respiratory variability, suggesting right atrial pressure of 3 mmHg. FINDINGS  Left Ventricle: Left ventricular ejection fraction, by estimation, is 55 to 60%. The left ventricle has normal function. The left ventricle has no regional wall motion abnormalities. Definity contrast agent was given IV to delineate the left ventricular  endocardial borders. The left ventricular internal cavity size was normal in size. There is no left ventricular hypertrophy. Left ventricular diastolic parameters are consistent with Grade I diastolic dysfunction (impaired relaxation). Right Ventricle: The right ventricular size is normal. No  increase in right ventricular wall thickness. Right ventricular systolic function is normal. Tricuspid regurgitation signal is inadequate for assessing PA pressure. Left Atrium: Left atrial size was normal in size. Right Atrium: Right atrial size was normal in size. Pericardium: There is no evidence of pericardial effusion. Mitral Valve: The mitral valve is normal in structure. Mild mitral annular calcification. No evidence of mitral valve regurgitation. No evidence of mitral valve stenosis. Tricuspid Valve: The tricuspid valve is normal in structure. Tricuspid valve regurgitation is not demonstrated. Aortic Valve: The aortic valve is tricuspid. Aortic valve regurgitation is not visualized. No aortic stenosis is present. Pulmonic Valve: The pulmonic valve was normal in structure. Pulmonic valve regurgitation is not visualized. Aorta: The aortic root is normal in size and structure. Venous: The inferior vena cava is normal in size with greater than 50% respiratory variability, suggesting right atrial pressure of 3 mmHg. IAS/Shunts: No atrial level shunt detected by color flow Doppler.  LEFT VENTRICLE PLAX  2D LVIDd:         4.20 cm   Diastology LVIDs:         3.20 cm   LV e' medial:    6.53 cm/s LV PW:         1.10 cm   LV E/e' medial:  10.2 LV IVS:        1.40 cm   LV e' lateral:   6.74 cm/s LVOT diam:     2.10 cm   LV E/e' lateral: 9.9 LV SV:         66 LV SV Index:   31 LVOT Area:     3.46 cm  RIGHT VENTRICLE RV S prime:     9.14 cm/s TAPSE (M-mode): 1.9 cm LEFT ATRIUM             Index        RIGHT ATRIUM           Index LA diam:        4.20 cm 1.96 cm/m   RA Area:     17.60 cm LA Vol (A2C):   59.5 ml 27.80 ml/m  RA Volume:   49.00 ml  22.89 ml/m LA Vol (A4C):   62.1 ml 29.01 ml/m LA Biplane Vol: 63.5 ml 29.67 ml/m  AORTIC VALVE LVOT Vmax:   79.80 cm/s LVOT Vmean:  61.900 cm/s LVOT VTI:    0.191 m  AORTA Ao Root diam: 3.30 cm Ao Asc diam:  2.80 cm MITRAL VALVE MV Area (PHT): 4.36 cm    SHUNTS MV Decel Time: 174 msec    Systemic VTI:  0.19 m MV E velocity: 66.60 cm/s  Systemic Diam: 2.10 cm MV A velocity: 63.40 cm/s MV E/A ratio:  1.05 Dalton McleanMD Electronically signed by Wilfred Lacy Signature Date/Time: 11/16/2023/11:22:36 AM    Final    MR BRAIN WO CONTRAST Result Date: 11/15/2023 CLINICAL DATA:  Stroke, follow up. EXAM: MRI HEAD WITHOUT CONTRAST TECHNIQUE: Multiplanar, multiecho pulse sequences of the brain and surrounding structures were obtained without intravenous contrast. COMPARISON:  Head CT and CTA head/neck 11/15/2023. MRI brain 12/07/2022. FINDINGS: Brain: Acute infarct in the right dorsal pons. No acute hemorrhage or significant mass effect. Stable background of mild cerebral and moderate central pontine chronic small-vessel disease. No hydrocephalus or extra-axial collection. Chronic microhemorrhages in the pons. No mass or midline shift. Vascular: Normal flow voids. Skull and upper cervical spine: Normal marrow signal. Sinuses/Orbits: No acute findings. Other: None. IMPRESSION: Acute infarct in the right dorsal pons.  No acute hemorrhage or significant mass effect. Electronically  Signed   By: Orvan Falconer M.D.   On: 11/15/2023 16:33      Blood pressure (!) 147/70, pulse 77, temperature 97.8 F (36.6 C), temperature source Oral, resp. rate 17, height 5\' 9"  (1.753 m), weight 106.3 kg, SpO2 97%.  Medical Problem List and Plan: 1. Functional deficits secondary to right dorsal pontine infarct with mild left hemiparesis and impaired left sided light touch and proprioception  -patient may  shower  -ELOS/Goals: 6-9 days, goals mod I with PT and OT  2.  Antithrombotics: -DVT/anticoagulation:  Pharmaceutical: Heparin  -antiplatelet therapy: Aspirin and Plavix for three weeks followed by Plavix alone  3. Pain Management: Tylenol as needed  4. Mood/Behavior/Sleep: LCSW to evaluate and provide emotional support  -continue Wellbutrin XL and Prozac  -antipsychotic agents: n/a  5. Neuropsych/cognition: This patient is capable of making decisions on his own behalf.  6. Skin/Wound Care: Routine skin care checks   7. Fluids/Electrolytes/Nutrition: Strict Is and Os.  Laboratory follow-up per nephrology  -renal diet; 1200 mL fluid restriction  8: Hypertension: monitor TID and prn  -continue Toprol-XL 25 mg daily  9: Hyperlipidemia: continue statin  10: Glaucoma: continue Cosopt and Xalatan  11: DM: CBGs QID, A1c = 6.5% (home regimen: insulin glargine 20 units twice daily, NovoLog insulin 7 units 3 times daily with meals)   -continue Novolog 5 units with meals  -continue Semglee 15 units BID  12: ESRD on HD on M/W/F via RUE AVF  - on Phoslo BID and Rocaltrol M/W/F  -HD at the end of the day to allow participation in therapies 13: OSA: no CPAP  14: CHF: echo>>55-60%, grade 1 DD -volume status per nephrology/HD  15: Obesity: BMI 34.61    Milinda Antis, PA-C 11/17/2023

## 2023-11-17 NOTE — Progress Notes (Signed)
Ranelle Oyster, MD  Physician Physical Medicine and Rehabilitation   PMR Pre-admission    Signed   Date of Service: 11/17/2023 11:25 AM  Related encounter: ED to Hosp-Admission (Discharged) from 11/15/2023 in [facility]   Signed     Expand All Collapse All  PMR Admission Coordinator Pre-Admission Assessment   Patient: Bobby Sampson is an 62 y.o., male MRN: [id number] DOB: 10-06-1961 Height: 5\' 9"  (175.3 cm) Weight: 106.3 kg (bed)                                                                                                                                                  Insurance Information HMO: yes    PPO:      PCP:      IPA:      80/20:      OTHER:  PRIMARY: [employer] Medicare      Policy#: [id number]      Subscriber: pt CM Name: Starleen Arms      Phone#: [phone]      Fax#: [phone] Pre-Cert#: [id number] auth for CIR from [facility] with [facility] for admit 12/17 with updates due 12/23 to fax listed above      Employer:  Benefits:  Phone #: [phone]     Name:  Eff. Date: 11/01/23     Deduct: $240 (met)      Out of Pocket Max: [phone] (met)      Life Max:n/a  CIR: $1730/admit      SNF: 20 full days Outpatient: 80%     Co-Pay: 20% Home Health: 100%      Co-Pay:  DME: 80%     Co-Pay: 20% Providers:  SECONDARY: Medicaid [place] [facility]      Policy#: [id number]      Phone#: [phone]   Financial Counselor:       Phone#:    The "Data Collection Information Summary" for patients in Inpatient Rehabilitation Facilities with attached "Privacy Act Statement-Health Care Records" was provided and verbally reviewed with: Patient and Family   Emergency Contact Information Contact Information       Name Relation Home Work Mobile    [place] Spouse [phone]        Yan,Nicolette Daughter     [phone]         Other Contacts   None on File      Current Medical History  Patient Admitting Diagnosis: R dorsal pontine CVA    History of Present Illness: Pt is a 62 y/o male withPMH of ESRD on HD MWF, HTN, HLD, DM, CHF, OSA non compliant with CPAP, and 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  NIHSS 4, A1C 6.5.  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  EF 55-60% with grade 1 diastolic dysfunction, no shunt.  Neurology recommend asa 81 daily and clopidogrel 75 mg daily x 3 weeks, then plavix alone.  Encourage compliance with Cpap.  Therapy evaluations completed and pt was recommended for CIR.    Complete NIHSS TOTAL: 4 Glasgow Coma Scale Score: 14   Patient's medical record from [facility] has been reviewed by the rehabilitation admission coordinator and physician.   Past Medical History      Past Medical History:  Diagnosis Date   Diabetes mellitus without complication (HCC)     Hypertension     Neuropathy     Renal disorder     Stroke [facility])      2016          Has the patient had major surgery during 100 days prior to admission? No   Family History  family history includes Diabetes in his mother; Hypertension in his mother; Kidney disease in his mother.     Current Medications   Current Medications    Current Facility-Administered Medications:    acetaminophen (TYLENOL) tablet 650 mg, 650 mg, Oral, Q6H PRN **OR** acetaminophen (TYLENOL) suppository 650 mg, 650 mg, Rectal, Q6H PRN, Koomson, Julius, MD   aspirin chewable tablet 81 mg, 81 mg, Oral, Daily, Sethi, Pramod S, MD, 81 mg at 11/17/23 0915   atorvastatin (LIPITOR) tablet 80 mg, 80 mg, Oral, QHS, Rana Snare, DO, 80 mg at 11/16/23 2109   buPROPion (WELLBUTRIN XL) 24 hr tablet 150 mg, 150 mg, Oral, Daily, Rana Snare, DO, 150 mg at 11/17/23 0915   calcitRIOL (ROCALTROL) capsule 0.5 mcg, 0.5 mcg, Oral, Q M,W,F-1800, Julien Nordmann, PA-C, 0.5 mcg  at 11/16/23 1820   calcium acetate (PHOSLO) capsule 667 mg, 667 mg, Oral, BID WC, Rana Snare, DO, 667 mg at 11/17/23 0915   Chlorhexidine Gluconate Cloth 2 % PADS 6 each, 6 each, Topical, Q0600, Delano Metz, MD, 6 each at 11/17/23 [phone]   clopidogrel (PLAVIX) tablet 75 mg, 75 mg, Oral, Daily, Gevena Mart A, NP, 75 mg at 11/17/23 0915   dorzolamide-timolol (COSOPT) 2-0.5 % ophthalmic solution 1 drop, 1 drop, Both Eyes, BID, Rana Snare, DO, 1 drop at 11/17/23 0916   FLUoxetine (PROZAC) capsule 20 mg, 20 mg, Oral, QHS, Rana Snare, DO, 20 mg at 11/16/23 2109   heparin injection 5,000 Units, 5,000 Units, Subcutaneous, Q8H, Koomson, Julius, MD, 5,000 Units at 11/17/23 0640   insulin aspart (novoLOG) injection 5 Units, 5 Units, Subcutaneous, TID WC, Nooruddin, Saad, MD   insulin glargine-yfgn (SEMGLEE) injection 15 Units, 15 Units, Subcutaneous, BID, Nooruddin, Saad, MD, 15 Units at 11/17/23 0925   latanoprost (XALATAN) 0.005 % ophthalmic solution 1 drop, 1 drop, Left Eye, QHS, Rana Snare, DO, 1 drop at 11/16/23 2110   metoprolol succinate (TOPROL-XL) 24 hr tablet 25 mg, 25 mg, Oral, Daily, Nooruddin, Saad, MD, 25 mg at 11/17/23 0915   pantoprazole (PROTONIX) EC tablet 40 mg, 40 mg, Oral, QHS, Rana Snare, DO, 40 mg at 11/16/23 2109   senna-docusate (Senokot-S) tablet 1 tablet, 1 tablet, Oral, QHS PRN, Laretta Bolster, MD     Patients Current Diet:  Diet Order                  Diet renal with fluid restriction Fluid restriction: 1200 mL Fluid; Room service appropriate? Yes; Fluid consistency: Thin  Diet effective now  Precautions / Restrictions Precautions Precautions: Fall, Other (comment) Precaution Comments: Decreased LE coordination Restrictions Weight Bearing Restrictions Per Provider Order: No    Has the patient had 2 or more falls or a fall with injury in the past year?Yes   Prior Activity Level Community (5-7x/wk): independent,  working out at [employer], walking, working towards getting on kidney transplant list, no DME at baseline, but does have some assist with ADLs from spouse or PCA after HD sessions due to fatigue.  Doesn't drive   Prior Functional Level Prior Function Prior Level of Function : Independent/Modified Independent Mobility Comments: Using RW for mobility. going to the [facility] for strengthening and walking on treadmill; two falls in past 6 months, had R LE hip/pelvic fracture 2 years ago and feels never really "set" right; has had toe amputations on L foot ADLs Comments: Performing BADLs with exception of socks and shoes (his wife helps with). Wife does IADLs and driving   Self Care: Did the patient need help bathing, dressing, using the toilet or eating?  Independent   Indoor Mobility: Did the patient need assistance with walking from room to room (with or without device)? Independent   Stairs: Did the patient need assistance with internal or external stairs (with or without device)? Independent   Functional Cognition: Did the patient need help planning regular tasks such as shopping or remembering to take medications? Independent   Patient Information Are you of Hispanic, Latino/a,or Spanish origin?: A. No, not of Hispanic, Latino/a, or Spanish origin What is your race?: B. Black or African American Do you need or want an interpreter to communicate with a doctor or health care staff?: 0. No   Patient's Response To:  Health Literacy and Transportation Is the patient able to respond to health literacy and transportation needs?: Yes Health Literacy - How often do you need to have someone help you when you read instructions, pamphlets, or other written material from your doctor or pharmacy?: Never In the past 12 months, has lack of transportation kept you from medical appointments or from getting medications?: No In the past 12 months, has lack of transportation kept you from meetings, work, or from getting  things needed for daily living?: No   Journalist, newspaper / Equipment Home Equipment: Cane - quad, Medical laboratory scientific officer - single point, Rollator (4 wheels), Tub bench, BSC/3in1, Agricultural consultant (2 wheels), Wheelchair - manual, Grab bars - tub/shower   Prior Device Use: Indicate devices/aids used by the patient prior to current illness, exacerbation or injury? None of the above   Current Functional Level Cognition   Overall Cognitive Status: Within Functional Limits for tasks assessed Orientation Level: Oriented X4 General Comments: very engaged and motivated. Aware of deficits and responding well to cues throughout    Extremity Assessment (includes Sensation/Coordination)   Upper Extremity Assessment: Defer to OT evaluation RUE Deficits / Details: 4/5 overall. Decreased FM and in hand manipulation. Chronic shoulder injury from football. RUE Coordination: decreased fine motor, decreased gross motor LUE Deficits / Details: 4/5 overall. Decreased FM and in hand manipulation LUE Coordination: decreased fine motor, decreased gross motor  Lower Extremity Assessment: RLE deficits/detail, LLE deficits/detail RLE Deficits / Details: AROM WFL, strength grossly 4/5 (reports this is his "weak" leg from prior strokes) RLE Sensation: WNL LLE Deficits / Details: AROM WFL, strength WFL, coordination deficits with difficulty with grading movement and targeting/placement, decreased sensation to light touch LLE Sensation: decreased proprioception, decreased light touch LLE Coordination: decreased fine motor, decreased gross motor  ADLs   Overall ADL's : Needs assistance/impaired Eating/Feeding: Set up, Sitting Grooming: Oral care, Minimal assistance, Standing Grooming Details (indicate cue type and reason): Min A for standing balance with slight lateral lean to L. Noting decreased FM coordination and in hand manipulation to manipulate tooth brush and toothpaste Upper Body Bathing: Minimal assistance,  Sitting Lower Body Bathing: Moderate assistance, Sit to/from stand Upper Body Dressing : Minimal assistance, Sitting Lower Body Dressing: Maximal assistance, Sit to/from stand Lower Body Dressing Details (indicate cue type and reason): Max A for donning shoes and initial assist with dynamic standing balance Toilet Transfer: Moderate assistance, Maximal assistance, +2 for safety/equipment, Ambulation, Rolling walker (2 wheels) (simulated at recliner) Functional mobility during ADLs: Moderate assistance, +2 for physical assistance, +2 for safety/equipment, Rolling walker (2 wheels) General ADL Comments: Pt demonstrating decreased coordination, balance, and strength. Overall, very motivated     Mobility   General bed mobility comments: In recliner upon arrival     Transfers   Overall transfer level: Needs assistance Equipment used: Rolling walker (2 wheels) Transfers: Sit to/from Stand Sit to Stand: Mod assist, +2 safety/equipment General transfer comment: Mod A for power up and gaining of balance. Cues for pushing at arm rests; performed x 3 during session, less assist needed with subsequent trials     Ambulation / Gait / Stairs / Wheelchair Mobility   Ambulation/Gait Ambulation/Gait assistance: Mod assist, +2 safety/equipment Gait Distance (Feet): 40 Feet (x 2) Assistive device: Rolling walker (2 wheels) Gait Pattern/deviations: Step-to pattern, Step-through pattern, Ataxic, Staggering left, Narrow base of support, Wide base of support General Gait Details: cues for L foot to L due to crossing midline at times with L then with too wide stance; LOB to L at times needing mod A to correct; adjusted walker height and applied shoes for balance prior to ambulation     Posture / Balance Balance Overall balance assessment: Needs assistance Sitting-balance support: No upper extremity supported, Feet supported Sitting balance-Leahy Scale: Fair Standing balance support: Bilateral upper extremity  supported Standing balance-Leahy Scale: Poor Standing balance comment: UE support for balance and A (min A for static, mod A dynamic) due to L lateral lean during standing at sink for oral hygiene     Special needs/care consideration CPAP, Dialysis: Hemodialysis Monday, Wednesday, and Friday, and Diabetic management yes        Previous Home Environment (from acute therapy documentation) Living Arrangements: Spouse/significant other, Children (wife and daughter) Available Help at Discharge: Family, Available 24 hours/day, Personal care attendant (wife is a Lawyer) Type of Home: House Home Layout: One level Home Access: Ramped entrance Bathroom Shower/Tub: Engineer, manufacturing systems: Handicapped height Home Care Services: Yes Type of Home Care Services: Homehealth aide   Discharge Living Setting Plans for Discharge Living Setting: Patient's home, Lives with (comment) (spouse) Type of Home at Discharge: House Discharge Home Layout: One level Discharge Home Access: Ramped entrance Discharge Bathroom Shower/Tub: Tub/shower unit Discharge Bathroom Toilet: Standard Discharge Bathroom Accessibility: Yes How Accessible: Accessible via walker Does the patient have any problems obtaining your medications?: No   Social/Family/Support Systems Patient Roles: Spouse Anticipated Caregiver: spouse, Jahsai Pennachio Anticipated Caregiver's Contact Information: [phone] Ability/Limitations of Caregiver: none stated Caregiver Availability: 24/7 Discharge Plan Discussed with Primary Caregiver: Yes Is Caregiver In Agreement with Plan?: Yes Does Caregiver/Family have Issues with Lodging/Transportation while Pt is in Rehab?: No     Goals Patient/Family Goal for Rehab: PT/OT mod I, SLP n/a Expected length of stay: 6-9 days (pt goal to  be home by Christmas) Additional Information: HD MWF. Pt/Family Agrees to Admission and willing to participate: Yes Program Orientation Provided & Reviewed with  Pt/Caregiver Including Roles  & Responsibilities: Yes Additional Information Needs: Discharge plan: discharge home with spouse who is able to provide 24/7, also has a PCA after HD for assist with ADLs.     Decrease burden of Care through IP rehab admission: no     Possible need for SNF placement upon discharge: No.  Plan for d/c home with mod I goals.  Pt lives with spouse who is available 24/7 and also has a PCA for after dialysis sessions.      Patient Condition: This patient's condition remains as documented in the consult dated 12/16, in which the Rehabilitation Physician determined and documented that the patient's condition is appropriate for intensive rehabilitative care in an inpatient rehabilitation facility. Will admit to inpatient rehab today.   Preadmission Screen Completed By:  Stephania Fragmin, PT, DPT 11/17/2023 11:25 AM ______________________________________________________________________   Discussed status with Dr. Riley Kill on 11/17/23 at 11:34 AM  and received approval for admission today.   Admission Coordinator:  Stephania Fragmin, PT, DPT time 11:34 AM Dorna Bloom 11/17/23             Revision History

## 2023-11-17 NOTE — Discharge Instructions (Signed)
Patient instructions  Bobby Sampson you came to the emergency department due to numbness and tingling on your left side. 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 You were started on Plavix and aspirin to help resolve the blockage and your symptoms have been improving with this treatment.  For your treatment after a stroke: - Continue taking your aspirin 81 mg daily for 3 weeks  - Continue taking Plavix 75 mg daily, this will help decrease your risk of future strokes - Transfer to [facility] health Inpatient Rehab  For your hypertension:  Your home blood pressure medications were stopped while you were being treated for your stroke. - Restart your home amlodipine 5 mg daily - Restart you home metoprolol 25 mg daily    For you type 2 diabetes mellitus: Your mealtime (novolog) and long acting (lantus) insulin were held and gradually restarted while you were hospitalized. At time of discharge you were receiving 5 units of novolog with you meals three times a day and 15 units of lantus twice a day.  - Continue Novolog 5 units three times a day with meals  - Continue Lantus 15 units twice a day.  - After discharge for inpatient rehab follow up with your primary care about adjust your insulin.   For your renal disease  You were given dialysis inpatient while at the hospital. Your most recent dialysis session was completed on 11/16/2023. You will continue to receive dialysis inpatient while in inpatient rehab. After discharge from rehab please continue you normal dialysis schedule.   After leaving the inpatient rehab please make a follow up appointment, within 2 weeks of leaving the hospital, with your primary care provider to review your medication changes listed above. Continue taking the rest of your home medications as previously prescribed.

## 2023-11-17 NOTE — Discharge Summary (Signed)
Name: Bobby Sampson MRN: [id number] DOB: December 03, 1960 62 y.o. PCP: [facility], [facility]  Date of Admission: 11/15/2023  9:24 AM Date of Discharge:  11/17/2023 Attending Physician: Dr.  Ninetta Sampson  DISCHARGE DIAGNOSIS:  Primary Problem: Stroke-like symptoms   Hospital Problems: Principal Problem:   Stroke-like symptoms Active Problems:   Type 2 diabetes mellitus treated with insulin (HCC)    DISCHARGE MEDICATIONS:   Allergies as of 11/17/2023       Reactions   Liraglutide Nausea And Vomiting   Other reaction(s): Nausea and vomiting, Fatigue   Trulicity [dulaglutide] Nausea And Vomiting        Medication List     STOP taking these medications    midodrine 2.5 MG tablet Commonly known as: PROAMATINE       TAKE these medications    amLODipine 5 MG tablet Commonly known as: NORVASC Take 5 mg by mouth at bedtime.   aspirin EC 81 MG tablet Take 1 tablet (81 mg total) by mouth daily.   atorvastatin 80 MG tablet Commonly known as: LIPITOR Take 80 mg by mouth at bedtime.   buPROPion 150 MG 24 hr tablet Commonly known as: WELLBUTRIN XL Take 150 mg by mouth daily.   calcium acetate 667 MG capsule Commonly known as: PHOSLO Take 667 mg by mouth in the morning and at bedtime. Pt gets a 3rd dose at dialysis   clopidogrel 75 MG tablet Commonly known as: PLAVIX Take 1 tablet (75 mg total) by mouth daily. Start taking on: November 18, 2023   Dialyvite/Zinc Tabs Take 1 tablet by mouth at bedtime.   dorzolamide-timolol 2-0.5 % ophthalmic solution Commonly known as: COSOPT Place 1 drop into both eyes 2 (two) times daily.   FLUoxetine 20 MG capsule Commonly known as: PROZAC Take 20 mg by mouth at bedtime.   Lantus SoloStar 100 UNIT/ML Solostar Pen Generic drug: insulin glargine Inject 16 Units into the skin at bedtime. What changed:  how much to take when to take this   latanoprost 0.005 % ophthalmic solution Commonly known as: XALATAN Place 1  drop into the left eye at bedtime.   melatonin 3 MG Tabs tablet Take 6 mg by mouth at bedtime.   metoprolol succinate 25 MG 24 hr tablet Commonly known as: TOPROL-XL Take 1 tablet (25 mg total) by mouth daily.   multivitamin Tabs tablet Take 1 tablet by mouth at bedtime.   NovoLOG FlexPen 100 UNIT/ML FlexPen Generic drug: insulin aspart Inject 7 Units into the skin 3 (three) times daily with meals. What changed:  how much to take additional instructions   pantoprazole 40 MG tablet Commonly known as: Protonix Take 1 tablet (40 mg total) by mouth daily. What changed: when to take this        DISPOSITION AND FOLLOW-UP:  [name] was discharged from [facility] in improving and stable condition. 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 Pt discharged to inpatient rehab and 3 weeks DAPT then clopidogrel thereafter per below. At discharge, no motor deficits, mild L sides numbness/paresthesia that is most prominent at L hand.  Follow-up Recommendations: Consults: N/A Labs: N/A Studies: N/A Medications: Aspirin 81 mg for 3 weeks, clopidegrel 75 mg daily as monotherapy   Follow-up Appointments: Hospital f/u with PCP after discharge from inpatient rehab.   HOSPITAL COURSE:  Patient Summary: Hospital Summary: Bobby Sampson is a 62 y.o. 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 In the ED CT showed no acute pathology and his symptoms remained stable. CMP showed slight increase in creatine of 8 from baseline.  He was admitted for stroke work up, where MRI shortly after showed infract of the R dorsal pons.  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 Improvement of sensation on left side with mild residual paraesthesias of the left hand and leg. CTA head and neck Wo contrast showed severe bilateral intracranial ICA stenosis R>L and R>L intraductal vertebral artery stenosis. Etiology likely thrombotic. Echocardiogram unchanged from most recent in January, estimated ejection fraction 55-60% findings consistent with grade 1 diastolic dysfunction and mild dilation of the left and right atria with no atrial shunt. EKG monitoring during hospitalization showed sinus rhythm. LDL 53. HgbA1c 6.5 and patient reports good glycemic control at home. With these results suspicion for embolic etiology decreased. PT and OT recommended CIR placement, patient agreed.  - Clopidogrel 75 mg - will continue Plavix as monotherapy   - Aspirin 81 mg for 21 days - day 3  - Discharged to [facility] In[facility] Re[facility] - Appreciate Neurology recs.    # ESRD with HD M/W/F. # T2DM with long-term use of insulin  A1c 6.5.  Last dialysis was on Monday 12/16.  Patient baseline SCr of 6--->8 in the emergency department, and elevated to 10 day before discharge, cannot explain why other than his history of ESRD.  Electrolytes remained stable during hospitalization. Nephrology was consulted and patient received inpatient dialysis while hospitalized. Diabetes well controled. Lantus was held at start of hospitalization and gradually increased during his stay. Was placed on SSI at mealtime. Home regimen includes, Lantus 20 units BID and NovoLog 7 units with meals.  - Home regimen restarted at time of discharge.   #HTN:  BP 147/60 and allowed permissive hypertension for 48 hours in setting of suspected stroke. Home medications include amlodipine 2.5 mg and metoprolol succinate 25 mg daily. - Home amlodipine and metoprolol restarted at time of discharge.    #HFpEF  No chest pain, worsening SOB, or leg edema. Echo results today consistent with  most recent echo in January, estimated EF 55-60% - Home metoprolol restarted.      Stable chronic conditions  # Depression:  continued home fluoxetine 20 mg and wellbutrin 150 mg daily.   # Bilateral glaucoma:  continued home latanoprost 0.005% eye drops at bedtime.        DISCHARGE INSTRUCTIONS:   Discharge Instructions     Call MD for:  difficulty breathing, headache or visual disturbances   Complete by: As directed    Call MD for:  persistant dizziness or light-headedness   Complete by: As directed    Diet - low sodium heart healthy   Complete by: As directed    Increase activity slowly   Complete by: As directed      Patient instructions  Mr. Bobby Sampson, you came to the emergency department due to numbness and tingling on your left side. 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 You were started on Plavix and aspirin  to help resolve the blockage and your symptoms have been improving with this treatment.  For your treatment after a stroke: - Continue taking your aspirin 81 mg daily for 3 weeks  - Continue taking Plavix 75 mg daily, this will help decrease your risk of future strokes - Transfer to [facility] health Inpatient Rehab  For your hypertension:  Your home blood pressure medications were stopped while you were being treated for your stroke. - Restart your home amlodipine 5 mg daily - Restart you home metoprolol 25 mg daily    For you type 2 diabetes mellitus: Your mealtime (novolog) and long acting (lantus) insulin were held and gradually restarted while you were hospitalized. At time of discharge you home regimen was restarted.  - Continue Novolog 7 units three times a day with meals  - Continue Lantus 20 units twice a day.   For your renal disease  You were given dialysis inpatient while at the hospital. Your most recent dialysis session was completed on  11/16/2023. You will continue to receive dialysis inpatient while in inpatient rehab. After discharge from rehab please continue you normal dialysis schedule.   After leaving the inpatient rehab please make a follow up appointment, within 2 weeks of leaving the hospital, with your primary care provider to review your medication changes listed above. Continue taking the rest of your home medications as previously prescribed.   SUBJECTIVE:  Patient was seen resting comfortably in his chair. He stated that his sensation on his left side is improving with some residual tingling in his left hand and numbness of the left leg. He notes some hesitation while walking for fear of falling and injuring himself. PT and OT spoke with him yesterday and he wishes to go to inpatient rehab.  Discharge Vitals:   BP (!) 147/70 (BP Location: Left Arm)   Pulse 77   Temp 97.8 F (36.6 C) (Oral)   Resp 17   Ht 5\' 9"  (1.753 m)   Wt 106.3 kg Comment: bed  SpO2 97%   BMI 34.61 kg/m   OBJECTIVE:  Physical Exam Vitals and nursing note reviewed.  Constitutional:      General: He is not in acute distress. HENT:     Head: Normocephalic and atraumatic.  Eyes:     Extraocular Movements: Extraocular movements intact.     Pupils: Pupils are equal, round, and reactive to light.  Pulmonary:     Effort: Pulmonary effort is normal.  Musculoskeletal:        General: Normal range of motion.     Cervical back: Normal range of motion.     Right lower leg: No edema.     Left lower leg: No edema.  Skin:    General: Skin is warm and dry.  Neurological:     Mental Status: He is alert and oriented to person, place, and time.     GCS: GCS eye subscore is 4. GCS verbal subscore is 5. GCS motor subscore is 6.     Cranial Nerves: No cranial nerve deficit.     Motor: No weakness.     Comments: Sensation equal on left and right with mild paraesthesias of the left hand and leg.        Pertinent Labs, Studies, and Procedures:      Latest Ref Rng & Units 11/16/2023    6:13 AM 11/15/2023    9:58 AM 11/15/2023    9:53 AM  CBC  WBC 4.0 - 10.5 K/uL 6.8  7.3   Hemoglobin 13.0 - 17.0 g/dL 56.4  33.2  95.1   Hematocrit 39.0 - 52.0 % 36.8  35.0  37.4   Platelets 150 - 400 K/uL 271   224        Latest Ref Rng & Units 11/16/2023    6:13 AM 11/15/2023    9:58 AM 11/15/2023    9:53 AM  CMP  Glucose 70 - 99 mg/dL 884  166  063   BUN 8 - 23 mg/dL 54  37  40   Creatinine 0.61 - 1.24 mg/dL 01.60  1.09  3.23   Sodium 135 - 145 mmol/L 132  135  135   Potassium 3.5 - 5.1 mmol/L 3.8  4.1  4.2   Chloride 98 - 111 mmol/L 91  94  94   CO2 22 - 32 mmol/L 27   27   Calcium 8.9 - 10.3 mg/dL 8.9   8.6   Total Protein 6.5 - 8.1 g/dL   6.0   Total Bilirubin <1.2 mg/dL   0.6   Alkaline Phos 38 - 126 U/L   101   AST 15 - 41 U/L   16   ALT 0 - 44 U/L   15     ECHOCARDIOGRAM COMPLETE Result Date: 11/16/2023    ECHOCARDIOGRAM REPORT   Patient Name:   Branford Alvira Date of Exam: 11/16/2023 Medical Rec #:  [id number]  Height:       69.0 in Accession #:    [id number] Weight:       217.4 lb Date of Birth:  [birth year] BSA:          2.140 m Patient Age:    62 years   BP:           169/77 mmHg Patient Gender: M          HR:           65 bpm. Exam Location:  Inpatient Procedure: 2D Echo, Color Doppler, Cardiac Doppler and Intracardiac            Opacification Agent Indications:    Stroke i63.9  History:        Patient has prior history of Echocardiogram examinations, most                 recent 12/07/2022. CHF; Risk Factors:Hypertension, Diabetes and                 Sleep Apnea.  Sonographer:    Irving Burton Senior RDCS Referring Phys: Kalman Drape WOLFE IMPRESSIONS  1. Left ventricular ejection fraction, by estimation, is 55 to 60%. The left ventricle has normal function. The left ventricle has no regional wall motion abnormalities. Left ventricular diastolic parameters are consistent with Grade I diastolic dysfunction (impaired relaxation).  2. Right  ventricular systolic function is normal. The right ventricular size is normal. Tricuspid regurgitation signal is inadequate for assessing PA pressure.  3. The mitral valve is normal in structure. No evidence of mitral valve regurgitation. No evidence of mitral stenosis.  4. The aortic valve is tricuspid. Aortic valve regurgitation is not visualized. No aortic stenosis is present.  5. The inferior vena cava is normal in size with greater than 50% respiratory variability, suggesting right atrial pressure of 3 mmHg. FINDINGS  Left Ventricle: Left ventricular ejection fraction, by estimation, is 55 to 60%. The left ventricle has normal function. The left ventricle has no regional wall motion abnormalities. Definity contrast agent was given IV to delineate the left  ventricular  endocardial borders. The left ventricular internal cavity size was normal in size. There is no left ventricular hypertrophy. Left ventricular diastolic parameters are consistent with Grade I diastolic dysfunction (impaired relaxation). Right Ventricle: The right ventricular size is normal. No increase in right ventricular wall thickness. Right ventricular systolic function is normal. Tricuspid regurgitation signal is inadequate for assessing PA pressure. Left Atrium: Left atrial size was normal in size. Right Atrium: Right atrial size was normal in size. Pericardium: There is no evidence of pericardial effusion. Mitral Valve: The mitral valve is normal in structure. Mild mitral annular calcification. No evidence of mitral valve regurgitation. No evidence of mitral valve stenosis. Tricuspid Valve: The tricuspid valve is normal in structure. Tricuspid valve regurgitation is not demonstrated. Aortic Valve: The aortic valve is tricuspid. Aortic valve regurgitation is not visualized. No aortic stenosis is present. Pulmonic Valve: The pulmonic valve was normal in structure. Pulmonic valve regurgitation is not visualized. Aorta: The aortic root is normal  in size and structure. Venous: The inferior vena cava is normal in size with greater than 50% respiratory variability, suggesting right atrial pressure of 3 mmHg. IAS/Shunts: No atrial level shunt detected by color flow Doppler.  LEFT VENTRICLE PLAX 2D LVIDd:         4.20 cm   Diastology LVIDs:         3.20 cm   LV e' medial:    6.53 cm/s LV PW:         1.10 cm   LV E/e' medial:  10.2 LV IVS:        1.40 cm   LV e' lateral:   6.74 cm/s LVOT diam:     2.10 cm   LV E/e' lateral: 9.9 LV SV:         66 LV SV Index:   31 LVOT Area:     3.46 cm  RIGHT VENTRICLE RV S prime:     9.14 cm/s TAPSE (M-mode): 1.9 cm LEFT ATRIUM             Index        RIGHT ATRIUM           Index LA diam:        4.20 cm 1.96 cm/m   RA Area:     17.60 cm LA Vol (A2C):   59.5 ml 27.80 ml/m  RA Volume:   49.00 ml  22.89 ml/m LA Vol (A4C):   62.1 ml 29.01 ml/m LA Biplane Vol: 63.5 ml 29.67 ml/m  AORTIC VALVE LVOT Vmax:   79.80 cm/s LVOT Vmean:  61.900 cm/s LVOT VTI:    0.191 m  AORTA Ao Root diam: 3.30 cm Ao Asc diam:  2.80 cm MITRAL VALVE MV Area (PHT): 4.36 cm    SHUNTS MV Decel Time: 174 msec    Systemic VTI:  0.19 m MV E velocity: 66.60 cm/s  Systemic Diam: 2.10 cm MV A velocity: 63.40 cm/s MV E/A ratio:  1.05 Dalton McleanMD Electronically signed by Wilfred Lacy Signature Date/Time: 11/16/2023/11:22:36 AM    Final    MR BRAIN WO CONTRAST Result Date: 11/15/2023 CLINICAL DATA:  Stroke, follow up. EXAM: MRI HEAD WITHOUT CONTRAST TECHNIQUE: Multiplanar, multiecho pulse sequences of the brain and surrounding structures were obtained without intravenous contrast. COMPARISON:  Head CT and CTA head/neck 11/15/2023. MRI brain 12/07/2022. FINDINGS: Brain: Acute infarct in the right dorsal pons. No acute hemorrhage or significant mass effect. Stable background of mild cerebral and moderate central pontine chronic small-vessel  disease. No hydrocephalus or extra-axial collection. Chronic microhemorrhages in the pons. No mass or midline  shift. Vascular: Normal flow voids. Skull and upper cervical spine: Normal marrow signal. Sinuses/Orbits: No acute findings. Other: None. IMPRESSION: Acute infarct in the right dorsal pons. No acute hemorrhage or significant mass effect. Electronically Signed   By: Orvan Falconer M.D.   On: 11/15/2023 16:33   CT ANGIO HEAD NECK W WO CM (CODE STROKE) Result Date: 11/15/2023 CLINICAL DATA:  Neuro deficit, acute, stroke suspected EXAM: CT ANGIOGRAPHY HEAD AND NECK WITH AND WITHOUT CONTRAST TECHNIQUE: Multidetector CT imaging of the head and neck was performed using the standard protocol during bolus administration of intravenous contrast. Multiplanar CT image reconstructions and MIPs were obtained to evaluate the vascular anatomy. Carotid stenosis measurements (when applicable) are obtained utilizing NASCET criteria, using the distal internal carotid diameter as the denominator. RADIATION DOSE REDUCTION: This exam was performed according to the departmental dose-optimization program which includes automated exposure control, adjustment of the mA and/or kV according to patient size and/or use of iterative reconstruction technique. CONTRAST:  75mL OMNIPAQUE IOHEXOL 350 MG/ML SOLN COMPARISON:  Same day CT head. FINDINGS: CTA NECK FINDINGS Aortic arch: Great vessel origins are patent without significant stenosis. Right carotid system: No evidence of dissection, stenosis (50% or greater), or occlusion. Left carotid system: No evidence of dissection, stenosis (50% or greater), or occlusion. Vertebral arteries: Left dominant. No evidence of dissection, stenosis (50% or greater), or occlusion. Skeleton: No acute abnormality on limited assessment. Other neck: No acute abnormality on limited assessment Upper chest: Visualized lung apices are clear. Review of the MIP images confirms the above findings CTA HEAD FINDINGS Anterior circulation: Severe bilateral intracranial ICA stenosis due to atherosclerosis. This includes both  cavernous and paraclinoid ICA stenosis. 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 Otherwise, bilateral MCAs and ACAs are patent without proximal hemodynamically significant stenosis. 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 Basilar artery and bilateral posterior cerebral arteries are patent without proximal hemodynamically significant stenosis. Venous sinuses: As permitted by contrast timing, patent. Review of the MIP images confirms the above findings IMPRESSION: 1. No emergent large vessel occlusion. 2. Severe right greater than left intracranial ICA stenosis. 3. 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 Electronically Signed   By: Feliberto Harts M.D.   On: 11/15/2023 10:04   CT HEAD CODE STROKE WO CONTRAST Result Date: 11/15/2023 CLINICAL DATA:  Code stroke.  Neuro deficit, acute, stroke suspected EXAM: CT HEAD WITHOUT CONTRAST TECHNIQUE: Contiguous axial images were obtained from the base of the skull through the vertex without intravenous contrast. RADIATION DOSE REDUCTION: This exam was performed according to the departmental dose-optimization program which includes automated exposure control, adjustment of the mA and/or kV according to patient size and/or use of iterative reconstruction technique. COMPARISON:  CT head 12/06/2022. FINDINGS: Brain: No evidence of acute large vascular territory infarction, hemorrhage, hydrocephalus, extra-axial collection or mass lesion/mass effect. Vascular: No hyperdense vessel. Skull: No acute fracture. Sinuses/Orbits: Clear sinuses.  No acute orbital findings. Other: No mastoid effusions. ASPECTS [facility] Stroke Program Early CT Score) Total score (0-10 with 10 being normal): 10. IMPRESSION: 1. No evidence of acute intracranial abnormality. 2. ASPECTS is 10. Code stroke imaging results were communicated on 11/15/2023 at 9:37 am to provider Dr. Amada Jupiter via secure text paging.  Electronically Signed   By: Feliberto Harts M.D.   On: 11/15/2023 09:37     Signed: Ezekiel Slocumb, MS3 Pager: [phone] 11:50 AM, 11/17/2023  Attestation for Student Documentation:  I personally was present and re-performed the history, physical exam and medical decision-making activities of this service and have verified that the service and findings are accurately documented in the student's note.  Katheran James, DO 11/17/2023, 12:19 PM

## 2023-11-17 NOTE — PMR Pre-admission (Signed)
PMR Admission Coordinator Pre-Admission Assessment  Patient: Bobby Sampson is an 62 y.o., male MRN: [id number] DOB: 01-27-1961 Height: 5\' 9"  (175.3 cm) Weight: 106.3 kg (bed)              Insurance Information HMO: yes    PPO:      PCP:      IPA:      80/20:      OTHER:  PRIMARY: U[employer] Medicare      Policy#: [id number]      Subscriber: pt CM Name: Bobby Sampson      Phone#: [phone]      Fax#: [phone] Pre-Cert#: [id number] auth for CIR from [facility] with [facility] for admit 12/17 with updates due 12/23 to fax listed above      Employer:  Benefits:  Phone #: [phone]     Name:  Eff. Date: 11/01/23     Deduct: $240 (met)      Out of Pocket Max: [phone] (met)      Life Max:n/a  CIR: $1730/admit      SNF: 20 full days Outpatient: 80%     Co-Pay: 20% Home Health: 100%      Co-Pay:  DME: 80%     Co-Pay: 20% Providers:  SECONDARY: Medicaid [place] Access      Policy#: [id number]      Phone#: [phone]  Financial Counselor:       Phone#:   The "Data Collection Information Summary" for patients in Inpatient Rehabilitation Facilities with attached "Privacy Act Statement-Health Care Records" was provided and verbally reviewed with: Patient and Family  Emergency Contact Information Contact Information     Name Relation Home Work Mobile   [place] Spouse [phone]     Sampson,Bobby Daughter   [phone]      Other Contacts   None on File    Current Medical History  Patient Admitting Diagnosis: R dorsal pontine CVA   History of Present Illness: Pt is a 62 y/o male withPMH of ESRD on HD MWF, HTN, HLD, DM, CHF, OSA non compliant with CPAP, and 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  NIHSS 4, A1C 6.5.  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  EF 55-60% with grade 1  diastolic dysfunction, no shunt.  Neurology recommend asa 81 daily and clopidogrel 75 mg daily x 3 weeks, then plavix alone.  Encourage compliance with Cpap.  Therapy evaluations completed and pt was recommended for CIR.   Complete NIHSS TOTAL: 4 Glasgow Coma Scale Score: 14  Patient's medical record from [facility] has been reviewed by the rehabilitation admission coordinator and physician.  Past Medical History  Past Medical History:  Diagnosis Date   Diabetes mellitus without complication (HCC)    Hypertension    Neuropathy    Renal disorder    Stroke [facility])    2016    Has the patient had major surgery during 100 days prior to admission? No  Family History  family history includes Diabetes in his mother; Hypertension in his mother; Kidney disease in his mother.   Current Medications   Current Facility-Administered Medications:    acetaminophen (TYLENOL) tablet 650 mg, 650 mg, Oral, Q6H PRN **OR** acetaminophen (TYLENOL) suppository 650 mg, 650 mg, Rectal, Q6H PRN, Koomson, Julius, MD   aspirin chewable tablet 81 mg, 81 mg, Oral,  Daily, Micki Riley, MD, 81 mg at 11/17/23 0915   atorvastatin (LIPITOR) tablet 80 mg, 80 mg, Oral, QHS, Rana Snare, DO, 80 mg at 11/16/23 2109   buPROPion (WELLBUTRIN XL) 24 hr tablet 150 mg, 150 mg, Oral, Daily, Rana Snare, DO, 150 mg at 11/17/23 0915   calcitRIOL (ROCALTROL) capsule 0.5 mcg, 0.5 mcg, Oral, Q M,W,F-1800, Julien Nordmann, PA-C, 0.5 mcg at 11/16/23 1820   calcium acetate (PHOSLO) capsule 667 mg, 667 mg, Oral, BID WC, Rana Snare, DO, 667 mg at 11/17/23 0915   Chlorhexidine Gluconate Cloth 2 % PADS 6 each, 6 each, Topical, Q0600, Delano Metz, MD, 6 each at 11/17/23 [phone]   clopidogrel (PLAVIX) tablet 75 mg, 75 mg, Oral, Daily, Gevena Mart A, NP, 75 mg at 11/17/23 0915   dorzolamide-timolol (COSOPT) 2-0.5 % ophthalmic solution 1 drop, 1 drop, Both Eyes, BID, Rana Snare, DO, 1 drop at 11/17/23 0916   FLUoxetine  (PROZAC) capsule 20 mg, 20 mg, Oral, QHS, Rana Snare, DO, 20 mg at 11/16/23 2109   heparin injection 5,000 Units, 5,000 Units, Subcutaneous, Q8H, Koomson, Lisbeth Ply, MD, 5,000 Units at 11/17/23 0640   insulin aspart (novoLOG) injection 5 Units, 5 Units, Subcutaneous, TID WC, Nooruddin, Saad, MD   insulin glargine-yfgn (SEMGLEE) injection 15 Units, 15 Units, Subcutaneous, BID, Nooruddin, Saad, MD, 15 Units at 11/17/23 0925   latanoprost (XALATAN) 0.005 % ophthalmic solution 1 drop, 1 drop, Left Eye, QHS, Rana Snare, DO, 1 drop at 11/16/23 2110   metoprolol succinate (TOPROL-XL) 24 hr tablet 25 mg, 25 mg, Oral, Daily, Nooruddin, Saad, MD, 25 mg at 11/17/23 0915   pantoprazole (PROTONIX) EC tablet 40 mg, 40 mg, Oral, QHS, Rana Snare, DO, 40 mg at 11/16/23 2109   senna-docusate (Senokot-S) tablet 1 tablet, 1 tablet, Oral, QHS PRN, Laretta Bolster, MD  Patients Current Diet:  Diet Order             Diet renal with fluid restriction Fluid restriction: 1200 mL Fluid; Room service appropriate? Yes; Fluid consistency: Thin  Diet effective now                   Precautions / Restrictions Precautions Precautions: Fall, Other (comment) Precaution Comments: Decreased LE coordination Restrictions Weight Bearing Restrictions Per Provider Order: No   Has the patient had 2 or more falls or a fall with injury in the past year?Yes  Prior Activity Level Community (5-7x/wk): independent, working out at [employer], walking, working towards getting on kidney transplant list, no DME at baseline, but does have some assist with ADLs from spouse or PCA after HD sessions due to fatigue.  Doesn't drive  Prior Functional Level Prior Function Prior Level of Function : Independent/Modified Independent Mobility Comments: Using RW for mobility. 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 Wife does IADLs and driving  Self Care: Did the patient need help bathing, dressing, using the toilet or eating?  Independent  Indoor Mobility: Did the patient need assistance with walking from room to room (with or without device)? Independent  Stairs: Did the patient need assistance with internal or external stairs (with or without device)? Independent  Functional Cognition: Did the patient need help planning regular tasks such as shopping or remembering to take  medications? Independent  Patient Information Are you of Hispanic, Latino/a,or Spanish origin?: A. No, not of Hispanic, Latino/a, or Spanish origin What is your race?: B. Black or African American Do you need or want an interpreter to communicate with a doctor or health care staff?: 0. No  Patient's Response To:  Health Literacy and Transportation Is the patient able to respond to health literacy and transportation needs?: Yes Health Literacy - How often do you need to have someone help you when you read instructions, pamphlets, or other written material from your doctor or pharmacy?: Never In the past 12 months, has lack of transportation kept you from medical appointments or from getting medications?: No In the past 12 months, has lack of transportation kept you from meetings, work, or from getting things needed for daily living?: No  Journalist, newspaper / Equipment Home Equipment: Cane - quad, Medical laboratory scientific officer - single point, Rollator (4 wheels), Tub bench, BSC/3in1, Agricultural consultant (2 wheels), Wheelchair - manual, Grab bars - tub/shower  Prior Device Use: Indicate devices/aids used by the patient prior to current illness, exacerbation or injury? None of the above  Current Functional Level Cognition  Overall Cognitive Status: Within Functional Limits for tasks assessed Orientation Level: Oriented X4 General  Comments: very engaged and motivated. Aware of deficits and responding well to cues throughout    Extremity Assessment (includes Sensation/Coordination)  Upper Extremity Assessment: Defer to OT evaluation RUE Deficits / Details: 4/5 overall. Decreased FM and in hand manipulation. Chronic shoulder injury from football. RUE Coordination: decreased fine motor, decreased gross motor LUE Deficits / Details: 4/5 overall. Decreased FM and in hand manipulation LUE Coordination: decreased fine motor, decreased gross motor  Lower Extremity Assessment: RLE deficits/detail, LLE deficits/detail RLE Deficits / Details: AROM WFL, strength grossly 4/5 (reports this is his "weak" leg from prior strokes) RLE Sensation: WNL LLE Deficits / Details: AROM WFL, strength WFL, coordination deficits with difficulty with grading movement and targeting/placement, decreased sensation to light touch LLE Sensation: decreased proprioception, decreased light touch LLE Coordination: decreased fine motor, decreased gross motor    ADLs  Overall ADL's : Needs assistance/impaired Eating/Feeding: Set up, Sitting Grooming: Oral care, Minimal assistance, Standing Grooming Details (indicate cue type and reason): Min A for standing balance with slight lateral lean to L. Noting decreased FM coordination and in hand manipulation to manipulate tooth brush and toothpaste Upper Body Bathing: Minimal assistance, Sitting Lower Body Bathing: Moderate assistance, Sit to/from stand Upper Body Dressing : Minimal assistance, Sitting Lower Body Dressing: Maximal assistance, Sit to/from stand Lower Body Dressing Details (indicate cue type and reason): Max A for donning shoes and initial assist with dynamic standing balance Toilet Transfer: Moderate assistance, Maximal assistance, +2 for safety/equipment, Ambulation, Rolling walker (2 wheels) (simulated at recliner) Functional mobility during ADLs: Moderate assistance, +2 for physical assistance,  +2 for safety/equipment, Rolling walker (2 wheels) General ADL Comments: Pt demonstrating decreased coordination, balance, and strength. Overall, very motivated    Mobility  General bed mobility comments: In recliner upon arrival    Transfers  Overall transfer level: Needs assistance Equipment used: Rolling walker (2 wheels) Transfers: Sit to/from Stand Sit to Stand: Mod assist, +2 safety/equipment General transfer comment: Mod A for power up and gaining of balance. Cues for pushing at arm rests; performed x 3 during session, less assist needed with subsequent trials    Ambulation / Gait / Stairs / Wheelchair Mobility  Ambulation/Gait Ambulation/Gait assistance: Mod assist, +2 safety/equipment Gait Distance (Feet): 40 Feet (x 2) Assistive  device: Rolling walker (2 wheels) Gait Pattern/deviations: Step-to pattern, Step-through pattern, Ataxic, Staggering left, Narrow base of support, Wide base of support General Gait Details: cues for L foot to L due to crossing midline at times with L then with too wide stance; LOB to L at times needing mod A to correct; adjusted walker height and applied shoes for balance prior to ambulation    Posture / Balance Balance Overall balance assessment: Needs assistance Sitting-balance support: No upper extremity supported, Feet supported Sitting balance-Leahy Scale: Fair Standing balance support: Bilateral upper extremity supported Standing balance-Leahy Scale: Poor Standing balance comment: UE support for balance and A (min A for static, mod A dynamic) due to L lateral lean during standing at sink for oral hygiene    Special needs/care consideration CPAP, Dialysis: Hemodialysis Monday, Wednesday, and Friday, and Diabetic management yes     Previous Home Environment (from acute therapy documentation) Living Arrangements: Spouse/significant other, Children (wife and daughter) Available Help at Discharge: Family, Available 24 hours/day, Personal care  attendant (wife is a Lawyer) Type of Home: House Home Layout: One level Home Access: Ramped entrance Bathroom Shower/Tub: Engineer, manufacturing systems: Handicapped height Home Care Services: Yes Type of Home Care Services: Homehealth aide  Discharge Living Setting Plans for Discharge Living Setting: Patient's home, Lives with (comment) (spouse) Type of Home at Discharge: House Discharge Home Layout: One level Discharge Home Access: Ramped entrance Discharge Bathroom Shower/Tub: Tub/shower unit Discharge Bathroom Toilet: Standard Discharge Bathroom Accessibility: Yes How Accessible: Accessible via walker Does the patient have any problems obtaining your medications?: No  Social/Family/Support Systems Patient Roles: Spouse Anticipated Caregiver: spouse, Bobby Sampson Anticipated Caregiver's Contact Information: [phone] Ability/Limitations of Caregiver: none stated Caregiver Availability: 24/7 Discharge Plan Discussed with Primary Caregiver: Yes Is Caregiver In Agreement with Plan?: Yes Does Caregiver/Family have Issues with Lodging/Transportation while Pt is in Rehab?: No   Goals Patient/Family Goal for Rehab: PT/OT mod I, SLP n/a Expected length of stay: 6-9 days (pt goal to be home by Christmas) Additional Information: HD MWF. Pt/Family Agrees to Admission and willing to participate: Yes Program Orientation Provided & Reviewed with Pt/Caregiver Including Roles  & Responsibilities: Yes Additional Information Needs: Discharge plan: discharge home with spouse who is able to provide 24/7, also has a PCA after HD for assist with ADLs.   Decrease burden of Care through IP rehab admission: no   Possible need for SNF placement upon discharge: No.  Plan for d/c home with mod I goals.  Pt lives with spouse who is available 24/7 and also has a PCA for after dialysis sessions.    Patient Condition: This patient's condition remains as documented in the consult dated 12/16, in  which the Rehabilitation Physician determined and documented that the patient's condition is appropriate for intensive rehabilitative care in an inpatient rehabilitation facility. Will admit to inpatient rehab today.  Preadmission Screen Completed By:  Stephania Fragmin, PT, DPT 11/17/2023 11:25 AM ______________________________________________________________________   Discussed status with Dr. Riley Kill on 11/17/23 at 11:34 AM  and received approval for admission today.  Admission Coordinator:  Stephania Fragmin, PT, DPT time 11:34 AM Dorna Bloom 11/17/23

## 2023-11-17 NOTE — Progress Notes (Signed)
Arrived to CIR. Admission/Assessment complete. Discussed unit protocols, alarms/safety measures. Oriented to unit and assigned room.    Tilden Dome, LPN

## 2023-11-17 NOTE — Progress Notes (Addendum)
Hypoglycemic Event  CBG: 51  Treatment: 8 oz juice/soda  Symptoms: None  Follow-up CBG: Time:2130 CBG Result:92  Possible Reasons for Event: Unknown  Comments/MD notified:Charge nurse made aware    Thereasa Parkin

## 2023-11-17 NOTE — Plan of Care (Signed)
  Problem: Education: Goal: Knowledge of General Education information will improve Description: Including pain rating scale, medication(s)/side effects and non-pharmacologic comfort measures Outcome: Progressing   Problem: Health Behavior/Discharge Planning: Goal: Ability to manage health-related needs will improve Outcome: Progressing   Problem: Clinical Measurements: Goal: Ability to maintain clinical measurements within normal limits will improve Outcome: Progressing Goal: Will remain free from infection Outcome: Progressing Goal: Diagnostic test results will improve Outcome: Progressing Goal: Respiratory complications will improve Outcome: Progressing Goal: Cardiovascular complication will be avoided Outcome: Progressing   Problem: Activity: Goal: Risk for activity intolerance will decrease Outcome: Progressing   Problem: Nutrition: Goal: Adequate nutrition will be maintained Outcome: Progressing   Problem: Coping: Goal: Level of anxiety will decrease Outcome: Progressing   Problem: Elimination: Goal: Will not experience complications related to bowel motility Outcome: Progressing Goal: Will not experience complications related to urinary retention Outcome: Progressing   Problem: Pain Management: Goal: General experience of comfort will improve Outcome: Progressing   Problem: Safety: Goal: Ability to remain free from injury will improve Outcome: Progressing   Problem: Skin Integrity: Goal: Risk for impaired skin integrity will decrease Outcome: Progressing   Problem: Education: Goal: Knowledge of disease or condition will improve Outcome: Progressing Goal: Knowledge of secondary prevention will improve (MUST DOCUMENT ALL) Outcome: Progressing Goal: Knowledge of patient specific risk factors will improve Loraine Leriche N/A or DELETE if not current risk factor) Outcome: Progressing   Problem: Ischemic Stroke/TIA Tissue Perfusion: Goal: Complications of ischemic  stroke/TIA will be minimized Outcome: Progressing   Problem: Coping: Goal: Will verbalize positive feelings about self Outcome: Progressing Goal: Will identify appropriate support needs Outcome: Progressing   Problem: Health Behavior/Discharge Planning: Goal: Ability to manage health-related needs will improve Outcome: Progressing Goal: Goals will be collaboratively established with patient/family Outcome: Progressing   Problem: Self-Care: Goal: Ability to participate in self-care as condition permits will improve Outcome: Progressing Goal: Verbalization of feelings and concerns over difficulty with self-care will improve Outcome: Progressing Goal: Ability to communicate needs accurately will improve Outcome: Progressing   Problem: Nutrition: Goal: Risk of aspiration will decrease Outcome: Progressing Goal: Dietary intake will improve Outcome: Progressing   Problem: Education: Goal: Ability to describe self-care measures that may prevent or decrease complications (Diabetes Survival Skills Education) will improve Outcome: Progressing Goal: Individualized Educational Video(s) Outcome: Progressing   Problem: Coping: Goal: Ability to adjust to condition or change in health will improve Outcome: Progressing   Problem: Fluid Volume: Goal: Ability to maintain a balanced intake and output will improve Outcome: Progressing   Problem: Health Behavior/Discharge Planning: Goal: Ability to identify and utilize available resources and services will improve Outcome: Progressing Goal: Ability to manage health-related needs will improve Outcome: Progressing   Problem: Metabolic: Goal: Ability to maintain appropriate glucose levels will improve Outcome: Progressing   Problem: Nutritional: Goal: Maintenance of adequate nutrition will improve Outcome: Progressing Goal: Progress toward achieving an optimal weight will improve Outcome: Progressing   Problem: Skin  Integrity: Goal: Risk for impaired skin integrity will decrease Outcome: Progressing   Problem: Tissue Perfusion: Goal: Adequacy of tissue perfusion will improve Outcome: Progressing

## 2023-11-17 NOTE — Plan of Care (Signed)
  Problem: Consults Goal: RH STROKE PATIENT EDUCATION Description: See Patient Education module for education specifics  Outcome: Progressing   Problem: RH BOWEL ELIMINATION Goal: RH STG MANAGE BOWEL WITH ASSISTANCE Description: STG Manage Bowel with toileting Assistance. Outcome: Progressing   Problem: RH SAFETY Goal: RH STG ADHERE TO SAFETY PRECAUTIONS W/ASSISTANCE/DEVICE Description: STG Adhere to Safety Precautions With cues Assistance/Device. Outcome: Progressing   Problem: RH KNOWLEDGE DEFICIT Goal: RH STG INCREASE KNOWLEDGE OF DIABETES Description: Patient and spouse will be able to manage DM with educational resources for medications and dietary modifications independently Outcome: Progressing Goal: RH STG INCREASE KNOWLEDGE OF HYPERTENSION Description: Patient and spouse will be able to manage HTN with educational resources for medications and dietary modifications independently Outcome: Progressing Goal: RH STG INCREASE KNOWLEGDE OF HYPERLIPIDEMIA Description: Patient and spouse will be able to manage HLD with educational resources for medications and dietary modifications independently Outcome: Progressing Goal: RH STG INCREASE KNOWLEDGE OF STROKE PROPHYLAXIS Description: Patient and spouse will be able to manage secondary risks with educational resources for medications and dietary modifications independently Outcome: Progressing

## 2023-11-17 NOTE — Progress Notes (Signed)
Ranelle Oyster, MD  Physician Physical Medicine and Rehabilitation   Consult Note    Signed   Date of Service: 11/16/2023 12:20 PM  Related encounter: ED to Hosp-Admission (Discharged) from 11/15/2023 in [facility]   Signed     Expand All Collapse All           Physical Medicine and Rehabilitation Consult Reason for Consult:left sided weakness.  Referring Physician: Ninetta Lights     HPI: Bobby Sampson is a 62 y.o. male with a history of ESRD on HD, previous CVA, HTN, DM, CHF, OSA/obesity wo presented on 12/15 to the ED with left sided weakness and sensory loss. CT was negative. 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 MRI revealed acute infarct in the right dorsal pons. Permissive HTN allowed. HD ongoing. Hgb A1c 7.8. Pt was up with therapy today and was mod assist for sit-std transfers and mod assist 40' gait with rolling walker. Pt lives with his wife in a one level home with ramped entrance. Pt was mod I PTA and using rolling walker for mobility. He attended the [facility] regularly but has had some falls recently. Wife is a CNA and drives, does iADL's.      Review of Systems  Constitutional:  Negative for fever.  HENT:  Negative for hearing loss.   Eyes:  Positive for blurred vision.  Respiratory: Negative.    Cardiovascular: Negative.   Gastrointestinal: Negative.   Genitourinary: Negative.   Musculoskeletal: Negative.   Skin:  Negative for rash.  Neurological:  Positive for sensory change and focal weakness.  Psychiatric/Behavioral:  Negative for depression.         Past Medical History:  Diagnosis Date   Diabetes mellitus without complication (HCC)     Hypertension     Neuropathy     Renal disorder     Stroke [facility])      2016             Past Surgical History:  Procedure Laterality Date   INTRAMEDULLARY (IM) NAIL INTERTROCHANTERIC Right 11/02/2021    Procedure:  INTRAMEDULLARY (IM) NAIL INTERTROCHANTRIC;  Surgeon: Juanell Fairly, MD;  Location: [facility] ORS;  Service: Orthopedics;  Laterality: Right;   TONSILLECTOMY                 Family History  Problem Relation Age of Onset   Hypertension Mother     Diabetes Mother     Kidney disease Mother          Social History:  reports that he has never smoked. He has never used smokeless tobacco. He reports that he does not currently use alcohol. He reports that he does not currently use drugs. Allergies:  Allergies       Allergies  Allergen Reactions   Liraglutide Nausea And Vomiting      Other reaction(s): Nausea and vomiting, Fatigue   Trulicity [Dulaglutide] Nausea And Vomiting            Medications Prior to Admission  Medication Sig Dispense Refill   amLODipine (NORVASC) 5 MG tablet Take 5 mg by mouth at bedtime.       aspirin EC 81 MG tablet Take 1 tablet (81 mg total) by mouth daily.       atorvastatin (LIPITOR) 80 MG tablet Take 80 mg by mouth at bedtime.       B Complex-C-Zn-Folic Acid (DIALYVITE/ZINC) TABS Take 1 tablet by mouth at bedtime.  buPROPion (WELLBUTRIN XL) 150 MG 24 hr tablet Take 150 mg by mouth daily.       calcium acetate (PHOSLO) 667 MG capsule Take 667 mg by mouth in the morning and at bedtime. Pt gets a 3rd dose at dialysis       dorzolamide-timolol (COSOPT) 2-0.5 % ophthalmic solution Place 1 drop into both eyes 2 (two) times daily.       FLUoxetine (PROZAC) 20 MG capsule Take 20 mg by mouth at bedtime.       insulin glargine (LANTUS SOLOSTAR) 100 UNIT/ML Solostar Pen Inject 16 Units into the skin at bedtime. (Patient taking differently: Inject 20 Units into the skin 2 (two) times daily.) 15 mL 11   latanoprost (XALATAN) 0.005 % ophthalmic solution Place 1 drop into the left eye at bedtime.       melatonin 3 MG TABS tablet Take 6 mg by mouth at bedtime.       metoprolol succinate (TOPROL-XL) 25 MG 24 hr tablet Take 1 tablet (25 mg total) by mouth daily.        midodrine (PROAMATINE) 2.5 MG tablet Take 2.5 mg by mouth as needed.       multivitamin (RENA-VIT) TABS tablet Take 1 tablet by mouth at bedtime.   0   NOVOLOG FLEXPEN 100 UNIT/ML FlexPen Inject 7 Units into the skin 3 (three) times daily with meals. (Patient taking differently: Inject into the skin 3 (three) times daily with meals. Sliding scale) 15 mL     pantoprazole (PROTONIX) 40 MG tablet Take 1 tablet (40 mg total) by mouth daily. (Patient taking differently: Take 40 mg by mouth at bedtime.) 30 tablet 0          Home: Home Living Family/patient expects to be discharged to:: Private residence Living Arrangements: Children, Spouse/significant other Available Help at Discharge: Family, Available 24 hours/day, Personal care attendant (wife is a Lawyer) Type of Home: House Home Access: Ramped entrance Home Layout: One level Bathroom Shower/Tub: Engineer, manufacturing systems: Handicapped height Home Equipment: Medical laboratory scientific officer - quad, Medical laboratory scientific officer - single point, Rollator (4 wheels), Tub bench, BSC/3in1, Agricultural consultant (2 wheels), Wheelchair - manual, Grab bars - tub/shower  Functional History: Prior Function Prior Level of Function : Independent/Modified Independent Mobility Comments: Using RW for mobility. going to the [facility] for strengthening and walking on treadmill; two falls in past 6 months, had R LE hip/pelvic fracture 2 years ago and feels never really "set" right; has had toe amputations on L foot ADLs Comments: Performing BADLs with exception of socks and shoes (his wife helps with). Wife does IADLs and driving Functional Status:  Mobility: Bed Mobility General bed mobility comments: In recliner upon arrival Transfers Overall transfer level: Needs assistance Equipment used: Rolling walker (2 wheels) Transfers: Sit to/from Stand Sit to Stand: Mod assist, +2 safety/equipment General transfer comment: Mod A for power up and gaining of balance. Cues for pushing at arm rests; performed x 3 during  session, less assist needed with subsequent trials Ambulation/Gait Ambulation/Gait assistance: Mod assist, +2 safety/equipment Gait Distance (Feet): 40 Feet (x 2) Assistive device: Rolling walker (2 wheels) Gait Pattern/deviations: Step-to pattern, Step-through pattern, Ataxic, Staggering left, Narrow base of support, Wide base of support General Gait Details: cues for L foot to L due to crossing midline at times with L then with too wide stance; LOB to L at times needing mod A to correct; adjusted walker height and applied shoes for balance prior to ambulation   ADL: ADL Overall ADL's :  Needs assistance/impaired Eating/Feeding: Set up, Sitting Grooming: Oral care, Minimal assistance, Standing Grooming Details (indicate cue type and reason): Min A for standing balance with slight lateral lean to L. Noting decreased FM coordination and in hand manipulation to manipulate tooth brush and toothpaste Upper Body Bathing: Minimal assistance, Sitting Lower Body Bathing: Moderate assistance, Sit to/from stand Upper Body Dressing : Minimal assistance, Sitting Lower Body Dressing: Maximal assistance, Sit to/from stand Lower Body Dressing Details (indicate cue type and reason): Max A for donning shoes and initial assist with dynamic standing balance Toilet Transfer: Moderate assistance, Maximal assistance, +2 for safety/equipment, Ambulation, Rolling walker (2 wheels) (simulated at recliner) Functional mobility during ADLs: Moderate assistance, +2 for physical assistance, +2 for safety/equipment, Rolling walker (2 wheels) General ADL Comments: Pt demonstrating decreased coordination, balance, and strength. Overall, very motivated   Cognition: Cognition Overall Cognitive Status: Within Functional Limits for tasks assessed Orientation Level: Oriented X4 Cognition Arousal: Alert Behavior During Therapy: WFL for tasks assessed/performed Overall Cognitive Status: Within Functional Limits for tasks  assessed General Comments: very engaged and motivated. Aware of deficits and responding well to cues throughout   Blood pressure (!) 161/78, pulse 74, temperature 97.9 F (36.6 C), temperature source Oral, resp. rate 18, weight 98.6 kg, SpO2 96%. Physical Exam Constitutional:      Appearance: He is obese.  HENT:     Head: Normocephalic.     Right Ear: External ear normal.     Left Ear: External ear normal.     Nose: Nose normal.     Mouth/Throat:     Pharynx: No oropharyngeal exudate or posterior oropharyngeal erythema.  Eyes:     Extraocular Movements: Extraocular movements intact.     Conjunctiva/sclera: Conjunctivae normal.     Pupils: Pupils are equal, round, and reactive to light.  Cardiovascular:     Rate and Rhythm: Normal rate.  Pulmonary:     Effort: Pulmonary effort is normal.  Abdominal:     Palpations: Abdomen is soft.  Musculoskeletal:        General: No swelling or tenderness.     Cervical back: Normal range of motion.  Skin:    General: Skin is warm and dry.  Neurological:     Mental Status: He is alert.     Comments: Alert and oriented x 3. Normal insight and awareness. Intact Memory. Normal language and speech. Cranial nerve exam unremarkable except for mild sensory loss on left face. MMT: RUE 5/5. LUE 4+/5. Minimal left PD. RLE 5/5. LLE 4/5. Decreased FMC with past pointing and poor proprioception. LT and pain are 1/2 on left arm and leg. No abnl resting tone. DTR's 1+. .    Psychiatric:        Mood and Affect: Mood normal.        Behavior: Behavior normal.        Lab Results Last 24 Hours       Results for orders placed or performed during the hospital encounter of 11/15/23 (from the past 24 hours)  Glucose, capillary     Status: Abnormal    Collection Time: 11/15/23  4:35 PM  Result Value Ref Range    Glucose-Capillary 221 (H) 70 - 99 mg/dL    Comment 1 Notify RN      Comment 2 Document in Chart    Hemoglobin A1c     Status: Abnormal    Collection  Time: 11/15/23  7:57 PM  Result Value Ref Range    Hgb A1c MFr  Bld 6.5 (H) 4.8 - 5.6 %    Mean Plasma Glucose 139.85 mg/dL  Glucose, capillary     Status: Abnormal    Collection Time: 11/15/23  9:09 PM  Result Value Ref Range    Glucose-Capillary 210 (H) 70 - 99 mg/dL    Comment 1 Notify RN      Comment 2 Document in Chart    Glucose, capillary     Status: Abnormal    Collection Time: 11/16/23  6:04 AM  Result Value Ref Range    Glucose-Capillary 185 (H) 70 - 99 mg/dL    Comment 1 Notify RN      Comment 2 Document in Chart    Hepatitis B surface antigen     Status: None    Collection Time: 11/16/23  6:12 AM  Result Value Ref Range    Hepatitis B Surface Ag NON REACTIVE NON REACTIVE  Basic metabolic panel     Status: Abnormal    Collection Time: 11/16/23  6:13 AM  Result Value Ref Range    Sodium 132 (L) 135 - 145 mmol/L    Potassium 3.8 3.5 - 5.1 mmol/L    Chloride 91 (L) 98 - 111 mmol/L    CO2 27 22 - 32 mmol/L    Glucose, Bld 185 (H) 70 - 99 mg/dL    BUN 54 (H) 8 - 23 mg/dL    Creatinine, Ser 96.04 (H) 0.61 - 1.24 mg/dL    Calcium 8.9 8.9 - 54.0 mg/dL    GFR, Estimated 5 (L) >60 mL/min    Anion gap 14 5 - 15  CBC     Status: Abnormal    Collection Time: 11/16/23  6:13 AM  Result Value Ref Range    WBC 6.8 4.0 - 10.5 K/uL    RBC 4.45 4.22 - 5.81 MIL/uL    Hemoglobin 12.0 (L) 13.0 - 17.0 g/dL    HCT 98.1 (L) 19.1 - 52.0 %    MCV 82.7 80.0 - 100.0 fL    MCH 27.0 26.0 - 34.0 pg    MCHC 32.6 30.0 - 36.0 g/dL    RDW 47.8 (H) 29.5 - 15.5 %    Platelets 271 150 - 400 K/uL    nRBC 0.0 0.0 - 0.2 %  Lipid panel     Status: Abnormal    Collection Time: 11/16/23  6:13 AM  Result Value Ref Range    Cholesterol 114 0 - 200 mg/dL    Triglycerides 621 <308 mg/dL    HDL 38 (L) >65 mg/dL    Total CHOL/HDL Ratio 3.0 RATIO    VLDL 23 0 - 40 mg/dL    LDL Cholesterol 53 0 - 99 mg/dL  Glucose, capillary     Status: Abnormal    Collection Time: 11/16/23 11:31 AM  Result Value Ref  Range    Glucose-Capillary 233 (H) 70 - 99 mg/dL    Comment 1 Notify RN      Comment 2 Document in Chart         Imaging Results (Last 48 hours)  ECHOCARDIOGRAM COMPLETE Result Date: 11/16/2023    ECHOCARDIOGRAM REPORT   Patient Name:   Bobby Sampson Date of Exam: 11/16/2023 Medical Rec #:  [id number]  Height:       69.0 in Accession #:    [id number] Weight:       217.4 lb Date of Birth:  [birth year] BSA:          2.140 m Patient Age:  62 years   BP:           169/77 mmHg Patient Gender: M          HR:           65 bpm. Exam Location:  Inpatient Procedure: 2D Echo, Color Doppler, Cardiac Doppler and Intracardiac            Opacification Agent Indications:    Stroke i63.9  History:        Patient has prior history of Echocardiogram examinations, most                 recent 12/07/2022. CHF; Risk Factors:Hypertension, Diabetes and                 Sleep Apnea.  Sonographer:    Irving Burton Senior RDCS Referring Phys: Kalman Drape WOLFE IMPRESSIONS  1. Left ventricular ejection fraction, by estimation, is 55 to 60%. The left ventricle has normal function. The left ventricle has no regional wall motion abnormalities. Left ventricular diastolic parameters are consistent with Grade I diastolic dysfunction (impaired relaxation).  2. Right ventricular systolic function is normal. The right ventricular size is normal. Tricuspid regurgitation signal is inadequate for assessing PA pressure.  3. The mitral valve is normal in structure. No evidence of mitral valve regurgitation. No evidence of mitral stenosis.  4. The aortic valve is tricuspid. Aortic valve regurgitation is not visualized. No aortic stenosis is present.  5. The inferior vena cava is normal in size with greater than 50% respiratory variability, suggesting right atrial pressure of 3 mmHg. FINDINGS  Left Ventricle: Left ventricular ejection fraction, by estimation, is 55 to 60%. The left ventricle has normal function. The left ventricle has no regional wall motion  abnormalities. Definity contrast agent was given IV to delineate the left ventricular  endocardial borders. The left ventricular internal cavity size was normal in size. There is no left ventricular hypertrophy. Left ventricular diastolic parameters are consistent with Grade I diastolic dysfunction (impaired relaxation). Right Ventricle: The right ventricular size is normal. No increase in right ventricular wall thickness. Right ventricular systolic function is normal. Tricuspid regurgitation signal is inadequate for assessing PA pressure. Left Atrium: Left atrial size was normal in size. Right Atrium: Right atrial size was normal in size. Pericardium: There is no evidence of pericardial effusion. Mitral Valve: The mitral valve is normal in structure. Mild mitral annular calcification. No evidence of mitral valve regurgitation. No evidence of mitral valve stenosis. Tricuspid Valve: The tricuspid valve is normal in structure. Tricuspid valve regurgitation is not demonstrated. Aortic Valve: The aortic valve is tricuspid. Aortic valve regurgitation is not visualized. No aortic stenosis is present. Pulmonic Valve: The pulmonic valve was normal in structure. Pulmonic valve regurgitation is not visualized. Aorta: The aortic root is normal in size and structure. Venous: The inferior vena cava is normal in size with greater than 50% respiratory variability, suggesting right atrial pressure of 3 mmHg. IAS/Shunts: No atrial level shunt detected by color flow Doppler.  LEFT VENTRICLE PLAX 2D LVIDd:         4.20 cm   Diastology LVIDs:         3.20 cm   LV e' medial:    6.53 cm/s LV PW:         1.10 cm   LV E/e' medial:  10.2 LV IVS:        1.40 cm   LV e' lateral:   6.74 cm/s LVOT diam:     2.10  cm   LV E/e' lateral: 9.9 LV SV:         66 LV SV Index:   31 LVOT Area:     3.46 cm  RIGHT VENTRICLE RV S prime:     9.14 cm/s TAPSE (M-mode): 1.9 cm LEFT ATRIUM             Index        RIGHT ATRIUM           Index LA diam:         4.20 cm 1.96 cm/m   RA Area:     17.60 cm LA Vol (A2C):   59.5 ml 27.80 ml/m  RA Volume:   49.00 ml  22.89 ml/m LA Vol (A4C):   62.1 ml 29.01 ml/m LA Biplane Vol: 63.5 ml 29.67 ml/m  AORTIC VALVE LVOT Vmax:   79.80 cm/s LVOT Vmean:  61.900 cm/s LVOT VTI:    0.191 m  AORTA Ao Root diam: 3.30 cm Ao Asc diam:  2.80 cm MITRAL VALVE MV Area (PHT): 4.36 cm    SHUNTS MV Decel Time: 174 msec    Systemic VTI:  0.19 m MV E velocity: 66.60 cm/s  Systemic Diam: 2.10 cm MV A velocity: 63.40 cm/s MV E/A ratio:  1.05 Dalton McleanMD Electronically signed by Wilfred Lacy Signature Date/Time: 11/16/2023/11:22:36 AM    Final     MR BRAIN WO CONTRAST Result Date: 11/15/2023 CLINICAL DATA:  Stroke, follow up. EXAM: MRI HEAD WITHOUT CONTRAST TECHNIQUE: Multiplanar, multiecho pulse sequences of the brain and surrounding structures were obtained without intravenous contrast. COMPARISON:  Head CT and CTA head/neck 11/15/2023. MRI brain 12/07/2022. FINDINGS: Brain: Acute infarct in the right dorsal pons. No acute hemorrhage or significant mass effect. Stable background of mild cerebral and moderate central pontine chronic small-vessel disease. No hydrocephalus or extra-axial collection. Chronic microhemorrhages in the pons. No mass or midline shift. Vascular: Normal flow voids. Skull and upper cervical spine: Normal marrow signal. Sinuses/Orbits: No acute findings. Other: None. IMPRESSION: Acute infarct in the right dorsal pons. No acute hemorrhage or significant mass effect. Electronically Signed   By: Orvan Falconer M.D.   On: 11/15/2023 16:33    CT ANGIO HEAD NECK W WO CM (CODE STROKE) Result Date: 11/15/2023 CLINICAL DATA:  Neuro deficit, acute, stroke suspected EXAM: CT ANGIOGRAPHY HEAD AND NECK WITH AND WITHOUT CONTRAST TECHNIQUE: Multidetector CT imaging of the head and neck was performed using the standard protocol during bolus administration of intravenous contrast. Multiplanar CT image reconstructions and MIPs  were obtained to evaluate the vascular anatomy. Carotid stenosis measurements (when applicable) are obtained utilizing NASCET criteria, using the distal internal carotid diameter as the denominator. RADIATION DOSE REDUCTION: This exam was performed according to the departmental dose-optimization program which includes automated exposure control, adjustment of the mA and/or kV according to patient size and/or use of iterative reconstruction technique. CONTRAST:  75mL OMNIPAQUE IOHEXOL 350 MG/ML SOLN COMPARISON:  Same day CT head. FINDINGS: CTA NECK FINDINGS Aortic arch: Great vessel origins are patent without significant stenosis. Right carotid system: No evidence of dissection, stenosis (50% or greater), or occlusion. Left carotid system: No evidence of dissection, stenosis (50% or greater), or occlusion. Vertebral arteries: Left dominant. No evidence of dissection, stenosis (50% or greater), or occlusion. Skeleton: No acute abnormality on limited assessment. Other neck: No acute abnormality on limited assessment Upper chest: Visualized lung apices are clear. Review of the MIP images confirms the above findings CTA HEAD FINDINGS Anterior circulation: Severe  bilateral intracranial ICA stenosis due to atherosclerosis. This includes both cavernous and paraclinoid ICA stenosis. 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 Otherwise, bilateral MCAs and ACAs are patent without proximal hemodynamically significant stenosis. 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 Basilar artery and bilateral posterior cerebral arteries are patent without proximal hemodynamically significant stenosis. Venous sinuses: As permitted by contrast timing, patent. Review of the MIP images confirms the above findings IMPRESSION: 1. No emergent large vessel occlusion. 2. Severe right greater than left intracranial ICA stenosis. 3. Severe right greater than left intradural vertebral artery  stenosis. Electronically Signed   By: Feliberto Harts M.D.   On: 11/15/2023 10:04    CT HEAD CODE STROKE WO CONTRAST Result Date: 11/15/2023 CLINICAL DATA:  Code stroke.  Neuro deficit, acute, stroke suspected EXAM: CT HEAD WITHOUT CONTRAST TECHNIQUE: Contiguous axial images were obtained from the base of the skull through the vertex without intravenous contrast. RADIATION DOSE REDUCTION: This exam was performed according to the departmental dose-optimization program which includes automated exposure control, adjustment of the mA and/or kV according to patient size and/or use of iterative reconstruction technique. COMPARISON:  CT head 12/06/2022. FINDINGS: Brain: No evidence of acute large vascular territory infarction, hemorrhage, hydrocephalus, extra-axial collection or mass lesion/mass effect. Vascular: No hyperdense vessel. Skull: No acute fracture. Sinuses/Orbits: Clear sinuses.  No acute orbital findings. Other: No mastoid effusions. ASPECTS [facility] Stroke Program Early CT Score) Total score (0-10 with 10 being normal): 10. IMPRESSION: 1. No evidence of acute intracranial abnormality. 2. ASPECTS is 10. Code stroke imaging results were communicated on 11/15/2023 at 9:37 am to provider Dr. Amada Jupiter via secure text paging. Electronically Signed   By: Feliberto Harts M.D.   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 Yes Does the patient require coordinated care of a physician, rehab nurse, therapy disciplines of PT, OT to address physical and functional deficits in the context of the above medical diagnosis(es)? Yes Addressing deficits in the following areas: balance, endurance, locomotion, strength, transferring, bowel/bladder control, bathing, dressing, feeding, grooming, toileting, and psychosocial support Can the patient actively participate in an intensive therapy program of at least 3 hrs of therapy per day at least 5 days per week? Yes The potential for patient to make measurable gains while on inpatient rehab is excellent Anticipated functional outcomes upon discharge from inpatient rehab are modified independent  with PT, modified independent with OT, n/a with SLP. Estimated rehab length of stay to reach the above functional goals is: 7-10 days Anticipated discharge destination: Home Overall Rehab/Functional Prognosis: excellent   POST ACUTE RECOMMENDATIONS: This patient's condition is appropriate for continued rehabilitative care in the following setting: CIR Patient has agreed to participate in recommended program. Yes Note that insurance prior authorization may be required for reimbursement for recommended care.   Comment: Rehab Admissions Coordinator to follow up.       I have personally performed a face to face diagnostic  evaluation of this patient. Additionally, I have examined the patient's medical record including any pertinent labs and radiographic images. If the physician assistant has documented in this note, I have reviewed and edited or otherwise concur with the physician assistant's documentation.   Thanks,   Ranelle Oyster, MD 11/16/2023          Routing History

## 2023-11-18 DIAGNOSIS — I5032 Chronic diastolic (congestive) heart failure: Secondary | ICD-10-CM

## 2023-11-18 DIAGNOSIS — I10 Essential (primary) hypertension: Secondary | ICD-10-CM | POA: Diagnosis not present

## 2023-11-18 DIAGNOSIS — I6322 Cerebral infarction due to unspecified occlusion or stenosis of basilar arteries: Secondary | ICD-10-CM

## 2023-11-18 DIAGNOSIS — E11649 Type 2 diabetes mellitus with hypoglycemia without coma: Secondary | ICD-10-CM

## 2023-11-18 DIAGNOSIS — N186 End stage renal disease: Secondary | ICD-10-CM | POA: Diagnosis not present

## 2023-11-18 LAB — RENAL FUNCTION PANEL
Albumin: 3.3 g/dL — ABNORMAL LOW (ref 3.5–5.0)
Anion gap: 14 (ref 5–15)
BUN: 50 mg/dL — ABNORMAL HIGH (ref 8–23)
CO2: 25 mmol/L (ref 22–32)
Calcium: 8.9 mg/dL (ref 8.9–10.3)
Chloride: 95 mmol/L — ABNORMAL LOW (ref 98–111)
Creatinine, Ser: 10.21 mg/dL — ABNORMAL HIGH (ref 0.61–1.24)
GFR, Estimated: 5 mL/min — ABNORMAL LOW (ref >60)
Glucose, Bld: 166 mg/dL — ABNORMAL HIGH (ref 70–99)
Phosphorus: 6.6 mg/dL — ABNORMAL HIGH (ref 2.5–4.6)
Potassium: 3.9 mmol/L (ref 3.5–5.1)
Sodium: 134 mmol/L — ABNORMAL LOW (ref 135–145)

## 2023-11-18 LAB — CBC
HCT: 37.8 % — ABNORMAL LOW (ref 39.0–52.0)
Hemoglobin: 12.2 g/dL — ABNORMAL LOW (ref 13.0–17.0)
MCH: 26.8 pg (ref 26.0–34.0)
MCHC: 32.3 g/dL (ref 30.0–36.0)
MCV: 83.1 fL (ref 80.0–100.0)
Platelets: 276 10*3/uL (ref 150–400)
RBC: 4.55 MIL/uL (ref 4.22–5.81)
RDW: 16.2 % — ABNORMAL HIGH (ref 11.5–15.5)
WBC: 7.2 10*3/uL (ref 4.0–10.5)
nRBC: 0 % (ref 0.0–0.2)

## 2023-11-18 LAB — GLUCOSE, CAPILLARY
Glucose-Capillary: 148 mg/dL — ABNORMAL HIGH (ref 70–99)
Glucose-Capillary: 114 mg/dL — ABNORMAL HIGH (ref 70–99)
Glucose-Capillary: 152 mg/dL — ABNORMAL HIGH (ref 70–99)
Glucose-Capillary: 206 mg/dL — ABNORMAL HIGH (ref 70–99)

## 2023-11-18 MED ORDER — CALCIUM ACETATE (PHOS BINDER) 667 MG PO CAPS
1334.0000 mg | ORAL_CAPSULE | Freq: Two times a day (BID) | ORAL | Status: DC
  Administered 2023-11-18 – 2023-11-26 (×17): 1334 mg via ORAL
  Filled 2023-11-18 (×17): qty 2

## 2023-11-18 MED ORDER — INSULIN ASPART 100 UNIT/ML IJ SOLN
0.0000 [IU] | Freq: Three times a day (TID) | INTRAMUSCULAR | Status: DC
  Administered 2023-11-18 – 2023-11-19 (×2): 1 [IU] via SUBCUTANEOUS
  Administered 2023-11-19: 3 [IU] via SUBCUTANEOUS
  Administered 2023-11-19 – 2023-11-20 (×3): 1 [IU] via SUBCUTANEOUS
  Administered 2023-11-21: 3 [IU] via SUBCUTANEOUS
  Administered 2023-11-21: 2 [IU] via SUBCUTANEOUS
  Administered 2023-11-21: 1 [IU] via SUBCUTANEOUS
  Administered 2023-11-22: 2 [IU] via SUBCUTANEOUS
  Administered 2023-11-22: 1 [IU] via SUBCUTANEOUS
  Administered 2023-11-23 (×3): 2 [IU] via SUBCUTANEOUS
  Administered 2023-11-24 – 2023-11-25 (×3): 1 [IU] via SUBCUTANEOUS
  Administered 2023-11-25: 3 [IU] via SUBCUTANEOUS
  Administered 2023-11-26: 2 [IU] via SUBCUTANEOUS

## 2023-11-18 MED ORDER — INSULIN GLARGINE-YFGN 100 UNIT/ML ~~LOC~~ SOLN
13.0000 [IU] | Freq: Two times a day (BID) | SUBCUTANEOUS | Status: DC
  Administered 2023-11-18 – 2023-11-23 (×9): 13 [IU] via SUBCUTANEOUS
  Filled 2023-11-18 (×11): qty 0.13

## 2023-11-18 MED ORDER — INSULIN ASPART 100 UNIT/ML IJ SOLN: 3.0000 [IU] | Freq: Three times a day (TID) | INTRAMUSCULAR | Status: DC

## 2023-11-18 MED ORDER — CHLORHEXIDINE GLUCONATE CLOTH 2 % EX PADS
6.0000 | MEDICATED_PAD | Freq: Every day | CUTANEOUS | Status: DC
  Administered 2023-11-24: 6 via TOPICAL

## 2023-11-18 NOTE — Progress Notes (Signed)
Patient has not return to the unit from dialysis at this time, report given to oncoming night shift on CIR.    Tilden Dome, LPN

## 2023-11-18 NOTE — Progress Notes (Signed)
Inpatient Rehabilitation  Patient information reviewed and entered into eRehab system by Demarrio Menges Gentry, OTR/L, Rehab Quality Coordinator.   Information including medical coding, functional ability and quality indicators will be reviewed and updated through discharge.   

## 2023-11-18 NOTE — Plan of Care (Signed)
  Problem: RH Balance Goal: LTG Patient will maintain dynamic sitting balance (PT) Description: LTG:  Patient will maintain dynamic sitting balance with assistance during mobility activities (PT) Flowsheets (Taken 11/18/2023 1546) LTG: Pt will maintain dynamic sitting balance during mobility activities with:: Independent with assistive device  Goal: LTG Patient will maintain dynamic standing balance (PT) Description: LTG:  Patient will maintain dynamic standing balance with assistance during mobility activities (PT) Flowsheets (Taken 11/18/2023 1546) LTG: Pt will maintain dynamic standing balance during mobility activities with:: Supervision/Verbal cueing   Problem: Sit to Stand Goal: LTG:  Patient will perform sit to stand with assistance level (PT) Description: LTG:  Patient will perform sit to stand with assistance level (PT) Flowsheets (Taken 11/18/2023 1546) LTG: PT will perform sit to stand in preparation for functional mobility with assistance level: Supervision/Verbal cueing   Problem: RH Bed Mobility Goal: LTG Patient will perform bed mobility with assist (PT) Description: LTG: Patient will perform bed mobility with assistance, with/without cues (PT). Flowsheets (Taken 11/18/2023 1546) LTG: Pt will perform bed mobility with assistance level of: Independent with assistive device    Problem: RH Bed to Chair Transfers Goal: LTG Patient will perform bed/chair transfers w/assist (PT) Description: LTG: Patient will perform bed to chair transfers with assistance (PT). Flowsheets (Taken 11/18/2023 1546) LTG: Pt will perform Bed to Chair Transfers with assistance level: Supervision/Verbal cueing   Problem: RH Car Transfers Goal: LTG Patient will perform car transfers with assist (PT) Description: LTG: Patient will perform car transfers with assistance (PT). Flowsheets (Taken 11/18/2023 1546) LTG: Pt will perform car transfers with assist:: Supervision/Verbal cueing   Problem: RH  Furniture Transfers Goal: LTG Patient will perform furniture transfers w/assist (OT/PT) Description: LTG: Patient will perform furniture transfers  with assistance (OT/PT). Flowsheets (Taken 11/18/2023 1546) LTG: Pt will perform furniture transfers with assist:: Supervision/Verbal cueing   Problem: RH Ambulation Goal: LTG Patient will ambulate in controlled environment (PT) Description: LTG: Patient will ambulate in a controlled environment, # of feet with assistance (PT). Flowsheets (Taken 11/18/2023 1546) LTG: Pt will ambulate in controlled environ  assist needed:: Supervision/Verbal cueing LTG: Ambulation distance in controlled environment: 150 feet with LRAD Goal: LTG Patient will ambulate in home environment (PT) Description: LTG: Patient will ambulate in home environment, # of feet with assistance (PT). Flowsheets (Taken 11/18/2023 1546) LTG: Pt will ambulate in home environ  assist needed:: Supervision/Verbal cueing LTG: Ambulation distance in home environment: 75 feet with LRAD   Problem: RH Wheelchair Mobility Goal: LTG Patient will propel w/c in controlled environment (PT) Description: LTG: Patient will propel wheelchair in controlled environment, # of feet with assist (PT) Flowsheets (Taken 11/18/2023 1546) LTG: Pt will propel w/c in controlled environ  assist needed:: Independent with assistive device LTG: Propel w/c distance in controlled environment: 150 feet

## 2023-11-18 NOTE — Patient Care Conference (Signed)
Inpatient RehabilitationTeam Conference and Plan of Care Update Date: 11/18/2023   Time: 12:22 PM    Patient Name: Bobby Sampson      Medical Record Number: [id number]  Date of Birth: September 19, 1961 Sex: Male         Room/Bed: 4W08C/4W08C-01 Payor Info: Payor: [employer]vertising copy[employer] MEDICARE / Plan: Suan Halter DUAL COMPLETE / Product Type: *No Product type* /    Admit Date/Time:  11/17/2023  1:45 PM  Primary Diagnosis:  CVA (cerebral vascular accident) [facility])  Hospital Problems: Principal Problem:   CVA (cerebral vascular accident) [facility])    Expected Discharge Date: Expected Discharge Date: 12/01/23  Team Members Present: Physician leading conference: Dr. Fanny Dance Social Worker Present: Dossie Der, LCSW Nurse Present: Chana Bode, RN PT Present: Ambrose Finland, PT OT Present: Lou Cal, OT PPS Coordinator present : Fae Pippin, SLP     Current Status/Progress Goal Weekly Team Focus  Bowel/Bladder   continent of bowel and oliguric of bladder   will remain continent   will assess qshift and PRN    Swallow/Nutrition/ Hydration               ADL's   Setup/Supervision for UB care; CGA-Min A for LB care & toileting. Barriers: Decreased sensation/proprioception of L-hemibody, slight impulsivity.   Supervision (DME: [facility] & [facility])   LLE NMR, functional transfers, and activity tolerance    Mobility   bed mobility-supervision, sit to stand, stand pivot transfer with RW and min A with retropulsion for stand pivot transfer, gait x88 feet with RW and min A, inconsistent step length of LLE 2/2 decreased proprioception and sensation   supervision/mod I  D/C 10/30    Communication                Safety/Cognition/ Behavioral Observations               Pain   denies pain   will continue to deny pain   will assess qshift and PRN    Skin   skin is intact   skin will remain intact  will assess qshift and PRN      Discharge Planning:  HOme with  wife and daughter-wife works and daughter works third shift. Pt has aide 3 hrs M-F and two hours on the weekend. Goes to HD M,W,F   Team Discussion: Patient  post CVA with hx of DM, HTN, BP stable, HF, ESRD on HD; decreased proprioception and sensation with poor awareness , retinopathy poor vision (retinopathy).   Patient on target to meet rehab goals: yes, currently needs min assist for sit - stand and stand pivot transfers. Needs set up for upper body care and min assist for lower body and toileting. Goals for discharge set for supervision overall.  *See Care Plan and progress notes for long and short-term goals.   Revisions to Treatment Plan:  N/a   Teaching Needs: Safety, medications, transfers, toileting, etc.   Current Barriers to Discharge: Hemodialysis  Possible Resolutions to Barriers: Family education DME: BSC, [facility] HH follow up services     Medical Summary Current Status: CVA, ESRD, HTN, CHF, DM2 with hypoglycemia  Barriers to Discharge: Medical stability;Morbid Obesity;Renal Insufficiency/Failure  Barriers to Discharge Comments: CVA, ESRD, HTN, CHF, DM2 with hypoglycemia Possible Resolutions to [employer]: continue HD, monitor BP and fluid status, Adjust insulin dose   Continued Need for Acute Rehabilitation Level of Care: The patient requires daily medical management by a physician with specialized training in physical medicine and rehabilitation for the  following reasons: Direction of a multidisciplinary physical rehabilitation program to maximize functional independence : Yes Medical management of patient stability for increased activity during participation in an intensive rehabilitation regime.: Yes Analysis of laboratory values and/or radiology reports with any subsequent need for medication adjustment and/or medical intervention. : Yes   I attest that I was present, lead the team conference, and concur with the assessment and plan of the  team.   Chana Bode B 11/18/2023, 2:23 PM

## 2023-11-18 NOTE — Progress Notes (Signed)
PROGRESS NOTE   Subjective/Complaints: Patient is a little frustrated with CVA in regards him try to get her renal transplant.  Reports he feels well overall today.  No additional concerns.  ROS: Patient denies fever, rash, sore throat, blurred vision, dizziness, nausea, vomiting, diarrhea, cough, shortness of breath or chest pain, joint or back/neck pain, headache, or mood change.    Objective:   No results found. Recent Labs    11/16/23 0613 11/18/23 0515  WBC 6.8 7.2  HGB 12.0* 12.2*  HCT 36.8* 37.8*  PLT 271 276   Recent Labs    11/16/23 0613 11/18/23 0515  NA 132* 134*  K 3.8 3.9  CL 91* 95*  CO2 27 25  GLUCOSE 185* 166*  BUN 54* 50*  CREATININE 10.01* 10.21*  CALCIUM 8.9 8.9    Intake/Output Summary (Last 24 hours) at 11/18/2023 1338 Last data filed at 11/17/2023 1800 Gross per 24 hour  Intake 100 ml  Output --  Net 100 ml        Physical Exam: Vital Signs Blood pressure 139/75, pulse 71, temperature 97.8 F (36.6 C), resp. rate 18, height 5\' 9"  (1.753 m), weight 104.1 kg, SpO2 98%.   General: Alert and oriented x 3, No apparent distress HEENT: Head is normocephalic, atraumatic, PERRLA, EOMI, sclera anicteric, oral mucosa pink and moist Heart: Reg rate and rhythm. No murmor Chest: CTA bilaterally without wheezes, rales, or rhonchi; no distress Abdomen: Soft, non-tender, non-distended, bowel sounds positive. Extremities: AVF right upper extremity Psych: Pt's affect is appropriate. Pt is cooperative Skin: Clean and intact without signs of breakdown Neuro:  Alert and oriented x 3. Normal insight and awareness. Intact Memory. Normal language and speech. Cranial nerve exam unremarkable mild left facial sensory loss. Impaired vision at baseline.  MMT: RUE and RLE 5/5 prox to distal. LUE 4+/5 prox to distal with slight pronator drift. Impaired FTN/FMC.  LLE 4+/5.  Sensation 1/2 left arm and leg to  light touch and proprioception. DTR's 1+. No abnl resting tone. Pt with good sitting balance.   - unchagned 12/18 Musculoskeletal:  Missing digits 1 and 2 on the right     Assessment/Plan: 1. Functional deficits which require 3+ hours per day of interdisciplinary therapy in a comprehensive inpatient rehab setting. Physiatrist is providing close team supervision and 24 hour management of active medical problems listed below. Physiatrist and rehab team continue to assess barriers to discharge/monitor patient progress toward functional and medical goals  Care Tool:  Bathing    Body parts bathed by patient: Right arm, Left arm, Chest, Abdomen, Front perineal area, Buttocks, Right upper leg, Left upper leg, Right lower leg, Left lower leg, Face         Bathing assist Assist Level: Contact Guard/Touching assist     Upper Body Dressing/Undressing Upper body dressing   What is the patient wearing?: Pull over shirt    Upper body assist Assist Level: Set up assist    Lower Body Dressing/Undressing Lower body dressing      What is the patient wearing?: Pants, Underwear/pull up     Lower body assist Assist for lower body dressing: Contact Guard/Touching assist  Toileting Toileting Toileting Activity did not occur Press photographer and hygiene only): N/A (no void or bm)  Toileting assist       Transfers Chair/bed transfer  Transfers assist           Locomotion Ambulation   Ambulation assist              Walk 10 feet activity   Assist           Walk 50 feet activity   Assist           Walk 150 feet activity   Assist           Walk 10 feet on uneven surface  activity   Assist           Wheelchair     Assist               Wheelchair 50 feet with 2 turns activity    Assist            Wheelchair 150 feet activity     Assist          Blood pressure 139/75, pulse 71, temperature 97.8 F (36.6  C), resp. rate 18, height 5\' 9"  (1.753 m), weight 104.1 kg, SpO2 98%.  Medical Problem List and Plan: 1. 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 Formulized individual treatment plan based on medical history, underlying problem and comorbidities.     2.  Antithrombotics: -DVT/anticoagulation:  Pharmaceutical: Heparin             -antiplatelet therapy: Aspirin and Plavix for three weeks followed by Plavix alone   3. Pain Management: Tylenol as needed   4. Mood/Behavior/Sleep: LCSW to evaluate and provide emotional support             -continue Wellbutrin XL and Prozac             -antipsychotic agents: n/a   5. Neuropsych/cognition: This patient is capable of making decisions on his own behalf.   6. Skin/Wound Care: Routine skin care checks   7. Fluids/Electrolytes/Nutrition: Strict Is and Os.  Laboratory follow-up per nephrology             -renal diet; 1200 mL fluid restriction   8: Hypertension: monitor TID and prn             -continue Toprol-XL 25 mg daily  -12/18 overall controlled continue to monitor     11/18/2023    5:26 AM 11/17/2023    8:07 PM 11/17/2023    1:51 PM  Vitals with BMI  Height   5\' 9"   Weight 229 lbs 8 oz  233 lbs 15 oz  BMI 33.88  34.53  Systolic 139 144 161  Diastolic 75 73 71  Pulse 71 72 76      9: Hyperlipidemia: continue statin   10: Glaucoma: continue Cosopt and Xalatan   11: DM: CBGs QID, A1c = 6.5% (home regimen: insulin glargine 20 units twice daily, NovoLog insulin 7 units 3 times daily with meals)              -  continue Novolog 5 units with meals             -continue Semglee 15  units BID  -12/18 dc NovoLog  with meals due to hypoglycemia last night, restart correctional, Decrease semglee to 13u BID   CBG (last 3)  Recent Labs    11/17/23 2130 11/18/23 0600 11/18/23 1211  GLUCAP 92 148* 114*     12: ESRD on HD on M/W/F via RUE AVF             - on Phoslo BID and Rocaltrol M/W/F             -HD at the end of the day to allow participation in therapies  -Reviewed nephrology note, HD today 13: OSA: no CPAP   14: CHF: echo>>55-60%, grade 1 DD -volume status per nephrology/HD -No signs to indicate fluid overload   15: Obesity: BMI 34.61      LOS: 1 days A FACE TO FACE EVALUATION WAS PERFORMED  Fanny Dance 11/18/2023, 1:38 PM

## 2023-11-18 NOTE — Progress Notes (Signed)
   11/18/23 1843  Vitals  Temp 97.7 F (36.5 C)  Pulse Rate 82  Resp 15  BP (!) 162/90  SpO2 100 %  Weight 104.3 kg  Post Treatment  Dialyzer Clearance Clear  Hemodialysis Intake (mL) 0 mL  Liters Processed 82  Fluid Removed (mL) 2400 mL  Tolerated HD Treatment Yes  AVG/AVF Arterial Site Held (minutes) 10 minutes  AVG/AVF Venous Site Held (minutes) 10 minutes   Received patient in bed to unit.  Alert and oriented.  Informed consent signed and in chart.   TX duration:3.5hrs  Patient tolerated well.  Transported back to the room  Alert, without acute distress.  Hand-off given to patient's nurse.   Access used: RAVF Access issues: none  Total UF removed: 2.4L Medication(s) given: none    [facility]

## 2023-11-18 NOTE — Discharge Summary (Signed)
Physician Discharge Summary  Patient ID: Bobby Sampson MRN: [id number] DOB/AGE: 02-Nov-1961 21 y.o.  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 with impaired left-sided touch and proprioception Depression Hypertension Hyperlipidemia Glaucoma Diabetes mellitus End-stage renal disease on hemodialysis Obstructive sleep apnea Obesity Congestive heart failure  Discharged Condition: stable  Significant Diagnostic Studies:    Labs:  Basic Metabolic Panel:    Latest Ref Rng & Units 11/24/2023    1:44 PM 11/22/2023    1:35 PM 11/18/2023    5:15 AM  BMP  Glucose 70 - 99 mg/dL 102  725  366   BUN 8 - 23 mg/dL 59  55  50   Creatinine 0.61 - 1.24 mg/dL 44.03  47.42  59.56   Sodium 135 - 145 mmol/L 130  132  134   Potassium 3.5 - 5.1 mmol/L 5.3  4.9  3.9   Chloride 98 - 111 mmol/L 89  91  95   CO2 22 - 32 mmol/L 24  25  25    Calcium 8.9 - 10.3 mg/dL 9.8  9.4  8.9      CBC:    Latest Ref Rng & Units 11/24/2023    1:44 PM 11/22/2023    1:35 PM 11/18/2023    5:15 AM  CBC  WBC 4.0 - 10.5 K/uL 7.7  7.7  7.2   Hemoglobin 13.0 - 17.0 g/dL 38.7  56.4  33.2   Hematocrit 39.0 - 52.0 % 38.8  40.4  37.8   Platelets 150 - 400 K/uL 254  240  276      CBG: Recent Labs  Lab 11/25/23 1122 11/25/23 1621 11/25/23 2037 11/26/23 0606 11/26/23 1146  GLUCAP 167* 259* 282* 213* 233*    Brief HPI:   Bobby Sampson is a 62 y.o. male  with history of CVA X 2, T2DM with neuropathy and nephropathy, ESRD who awoke on the morning of 11/15/2023 with left-sided numbness and weakness  as well as reports of headache. He was found to have  acute infarct in the right dorsal pons. Dr. Pearlean Brownie recommended DAPT X 3 weeks followed by Plavix alone. HD has been ongoing and he is tolerating diet without signs of dysphagia.  Therapy has been working with patient who has been requiring min to mod assist overall. He was independent with walker PTA and CIR recommended due to functional decline.    Hospital Course: Bobby Sampson was admitted to rehab 11/17/2023 for inpatient therapies to consist of PT, ST and OT at least three hours five days a week. Past admission physiatrist, therapy team and rehab RN have worked together to provide customized collaborative inpatient rehab. HD has been ongoing at the end of the day to help with tolerance of therapy. Weight have been stable without signs of overload. His blood pressure have been monitored on TID basis and he did develop orthostatic symptoms on 12/20. Abdominal binder and TEDs were used for support. Fluid status as well as electrolyte abnormality have been managed with HD. Phoslo was adjusted due to high phosphorous level.   His diabetes has been monitored with ac/hs CBG checks and SSI was use prn for tighter BS control. BS have varied greatly due to poor intake and has required frequent  adjustment in novolog as well as insulin glargine. As intake improved, his basal insulin was titrated up and he was  advised to resume home regimen at discharge. MBS performed on day of discharge with recommendations to continue regular texture with thin liquids via provale cup due to aspiration risk and to minimize distractions at meals.  BLE knee sleeves were ordered due to for support due to tendency for knees to buckle. At time of discharge, no follow therapy recommendations available to LCSW  who will follow up to set therapy as needed.    Rehab course: During patient's stay in rehab weekly team conferences were held to monitor patient's progress, set goals and discuss barriers to discharge. At admission, patient required min assist with ADL task and with mobility. Speech therapy was consulted on 12/22 due to concerns of dysphagia and noted also to have mild cognitive impairment with decrease in  attention and awareness of deficits. He has had improvement in activity tolerance, balance, postural control as well as ability to compensate for deficits.  He is able to complete ADL tasks with CGA to min assist. He requires CGA for transfers and to ambulate 130' with use of RW.  He requires supervision for safe swallow strategies. Family education has been completed.    Discharge disposition: 01-Home or Self Care  Diet: Heart health/Carb modified  Special Instructions: No driving or strenuous activity till cleared by MD Continue ASA for 10 more days then stop.  Resume CGM and use home SSI for elevated BS.    Discharge Instructions     Ambulatory referral to Neurology   Complete by: As directed    An appointment is requested in approximately: 4 weeks   Ambulatory referral to Physical Medicine Rehab   Complete by: As directed    Moderate complexity follow-up 1 to 2 weeks right pontine infarction      Allergies as of 11/26/2023       Reactions   Liraglutide Nausea And Vomiting   Other reaction(s): Nausea and vomiting, Fatigue   Trulicity [dulaglutide] Nausea And Vomiting        Medication List     STOP taking these medications    amLODipine 5 MG tablet Commonly known as: NORVASC       TAKE these medications    Aspirin Low Dose 81 MG chewable tablet Generic drug: aspirin Chew 1 tablet (81 mg total) by mouth daily for 10 days.   atorvastatin 80 MG tablet Commonly known as: LIPITOR Take 80 mg by mouth at bedtime.   buPROPion 150 MG 24 hr tablet Commonly known as: WELLBUTRIN XL Take 150 mg by mouth daily.   calcitRIOL 0.5 MCG capsule Commonly known as: ROCALTROL Take 1 capsule (0.5 mcg total) by mouth every Monday, Wednesday, and Friday at 6 PM.   calcium acetate 667 MG capsule Commonly known as: PHOSLO Take 1 capsule (667 mg total) by mouth 2 (two) times daily. Take with meals (is a binder) Pt gets a 3rd dose at dialysis? What changed:  when to take  this additional instructions   clopidogrel 75 MG tablet Commonly known as: PLAVIX Take 1 tablet (75 mg total) by mouth daily.   Dialyvite/Zinc Tabs Take 1 tablet by mouth at bedtime.   dorzolamide-timolol 2-0.5 % ophthalmic solution Commonly known as: COSOPT Place 1 drop into both eyes 2 (two) times daily.   FLUoxetine 20 MG capsule Commonly known as: PROZAC Take 20 mg by mouth at bedtime.   Lantus SoloStar 100 UNIT/ML Solostar Pen Generic drug: insulin glargine Inject 20 Units into the skin 2 (two) times daily.   latanoprost 0.005 % ophthalmic  solution Commonly known as: XALATAN Place 1 drop into the left eye at bedtime.   melatonin 3 MG Tabs tablet Take 6 mg by mouth at bedtime.   metoprolol succinate 25 MG 24 hr tablet Commonly known as: TOPROL-XL Take 1 tablet (25 mg total) by mouth daily.   multivitamin Tabs tablet Take 1 tablet by mouth at bedtime.   NovoLOG FlexPen 100 UNIT/ML FlexPen Generic drug: insulin aspart Inject 7 Units into the skin 3 (three) times daily with meals. What changed:  how much to take additional instructions   pantoprazole 40 MG tablet Commonly known as: Protonix Take 1 tablet (40 mg total) by mouth daily. What changed: when to take this        Follow-up Information     Center[facility]. Call.   Why: for post hospital follow up Contact information: [address] [facility] [address] [place] [address] [phone]         Fanny Dance, MD Follow up.   Specialty: Physical Medicine and Rehabilitation Why: office will call you with follow up appointment Contact information: [address] [place] [address] [phone]         [facility] Follow up.   Why: office will call you with follow up appointment Contact information: [address]     [address] [place] [address] [phone]                Signed: Jacquelynn Cree 11/26/2023, 12:01 PM

## 2023-11-18 NOTE — Evaluation (Signed)
Occupational Therapy Assessment and Plan  Patient Details  Name: Teryn Warzecha MRN: [id number] Date of Birth: November 26, 1961  OT Diagnosis: blindness and low vision, hemiplegia affecting non-dominant side, muscle weakness (generalized), and decreased perception/sensation, and decreased activity tolerance Rehab Potential: Rehab Potential (ACUTE ONLY): Good ELOS: 10-12 days   Today's Date: 11/18/2023 OT Individual Time: 4098-1191 OT Individual Time Calculation (min): 73 min     Hospital Problem: Principal Problem:   CVA (cerebral vascular accident) [facility])   Past Medical History:  Past Medical History:  Diagnosis Date   Diabetes mellitus without complication (HCC)    Hypertension    Neuropathy    Renal disorder    Stroke (HCC)    2016   Past Surgical History:  Past Surgical History:  Procedure Laterality Date   INTRAMEDULLARY (IM) NAIL INTERTROCHANTERIC Right 11/02/2021   Procedure: INTRAMEDULLARY (IM) NAIL INTERTROCHANTRIC;  Surgeon: Juanell Fairly, MD;  Location: [facility] ORS;  Service: Orthopedics;  Laterality: Right;   TONSILLECTOMY      Assessment & Plan Clinical Impression: Patient is a 62 year old male who awoke on the morning of 11/15/2023 with left-sided numbness and weakness which is different from his baseline. He was transported to [facility] emergency department via [facility] who initiated code stroke. 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 Code stroke CT head with no acute process, Aspects 10. 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 MRI revealed acute infarct in the right dorsal pons. Permissive hypertension allowed. Loaded with aspirin and Plavix now on aspirin 81 mg daily and Plavix 75 mg daily for 3 weeks followed by Plavix alone. 2D echo with ejection fraction of 55 to  60%, grade 1 diastolic dysfunction. Patient has a history of hypertension, CHF, diabetes mellitus (hemoglobin A1c = 6.5%), end-stage renal disease on hemodialysis Mondays, Wednesdays and Fridays. Therapy consults obtained. Tolerating renal diet with fluid restriction 1200 mL. Thin liquids. Patient requires mod assist for ambulation with rolling walker, mod to min assist for sit to stand and mod assist for transfers. He lives with his wife in a 1 level home with ramped entrance. He attended the [facility] regularly prior to admission, but has had some falls recently. His wife is a CNA and she drives, does IADLs. Patient transferred to CIR on 11/17/2023 .    Patient currently requires min with basic self-care skills secondary to muscle weakness, decreased cardiorespiratoy endurance, impaired timing and sequencing, unbalanced muscle activation, ataxia, decreased coordination, and decreased motor planning, decreased visual acuity, decreased LLE proprioreception, decreased safety awareness, and decreased standing balance, decreased postural control, hemiplegia, and decreased balance strategies.  Prior to hospitalization, patient could complete BADLs with Min A and Mod I for functional mobility.   Patient will benefit from skilled intervention to increase independence with basic self-care skills prior to discharge home with care partner.  Anticipate patient will require 24 hour supervision and follow up home health.  OT - End of Session Activity Tolerance: Tolerates 30+ min activity with multiple rests Endurance Deficit: Yes OT Assessment Rehab Potential (ACUTE ONLY): Good OT Barriers to Discharge: Hemodialysis OT Patient demonstrates impairments in the following area(s): Balance;Endurance;Motor;Perception;Safety;Skin Integrity;Vision OT Basic ADL's Functional Problem(s): Bathing;Dressing;Toileting OT Advanced ADL's Functional Problem(s): Simple Meal Preparation OT Transfers Functional Problem(s):  Toilet;Tub/Shower OT Additional Impairment(s): Fuctional Use of Upper Extremity OT Plan OT Intensity: Minimum of  1-2 x/day, 45 to 90 minutes OT Frequency: 5 out of 7 days OT Duration/Estimated Length of Stay: 10-12 days OT Treatment/Interventions: Metallurgist training;Community reintegration;Discharge planning;Disease mangement/prevention;DME/adaptive equipment instruction;Functional electrical stimulation;Functional mobility training;Neuromuscular re-education;Pain management;Patient/family education;Psychosocial support;Self Care/advanced ADL retraining;Skin care/wound managment;Splinting/orthotics;Therapeutic Activities;UE/LE Coordination activities;UE/LE Strength taining/ROM;Wheelchair propulsion/positioning;Therapeutic Exercise;Visual/perceptual remediation/compensation OT Basic Self-Care Anticipated Outcome(s): Supervision OT Toileting Anticipated Outcome(s): Supervision OT Bathroom Transfers Anticipated Outcome(s): Supervision OT Recommendation Patient destination: Home Follow Up Recommendations: Home health OT Equipment Recommended: To be determined   OT Evaluation Precautions/Restrictions  Precautions Precautions: Fall Precaution Comments: L hemi; residual R-sided hemi, visual deficits at baseline;  L hemi; HD MWF; fluid restriction Restrictions Weight Bearing Restrictions Per Provider Order: No General Chart Reviewed: Yes Family/Caregiver Present: No Home Living/Prior Functioning Home Living Available Help at Discharge: Family, Available 24 hours/day, Personal care attendant (3 hrs M-F & 2 hrs S & S) Type of Home: House Home Access: Ramped entrance Home Layout: One level Bathroom Shower/Tub: Tub/shower unit (Grab bars, shower chair, long-handled shower head) Bathroom Toilet: Handicapped height (No grab bars) Bathroom Accessibility: Yes  Lives With: Spouse IADL History Homemaking Responsibilities: No Occupation: Retired Prior Engineer, drilling of  Independence: Requires assistive device for independence, Needs assistance with ADLs, Needs assistance with homemaking Dressing: Minimal Driving: No Vocation: Retired Administrator, sports Baseline Vision/History: 5 Retinopathy;4 Cataracts (Readers) Ability to See in Adequate Light: 2 Moderately impaired Patient Visual Report: No change from baseline Vision Assessment?: Yes Eye Alignment: Within Functional Limits Ocular Range of Motion: Within Functional Limits Tracking/Visual Pursuits: Able to track stimulus in all quads without difficulty Saccades: Within functional limits Convergence: Within functional limits Perception  Perception: Within Functional Limits Praxis Praxis Impairment Details: Motor planning Cognition Cognition Overall Cognitive Status: Within Functional Limits for tasks assessed Arousal/Alertness: Awake/alert Orientation Level: Person;Place;Situation Memory: Appears intact Awareness: Appears intact Problem Solving: Appears intact Safety/Judgment: Impaired Comments: Slight impulsivity Brief Interview for Mental Status (BIMS) Repetition of Three Words (First Attempt): 3 Temporal Orientation: Year: Correct Temporal Orientation: Month: Accurate within 5 days Temporal Orientation: Day: Correct Recall: "Sock": Yes, no cue required Recall: "Blue": Yes, no cue required Recall: "Bed": Yes, no cue required BIMS Summary Score: 15 Sensation Sensation Light Touch: Impaired by gross assessment Peripheral sensation comments: Numbness/tingling in LUE (since onset of CVA-pt reports it is improving); pt able to detect light touch in L LE but reports Numbness/tingling in LLE--unable to detect light touch in B LE 2/2 neuropathy, pt denies any N&T R LE.  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 Light Touch Impaired Details: Impaired LUE;Impaired LLE Hot/Cold: Not tested Proprioception: Not tested Stereognosis: Not tested Coordination Gross Motor  Movements are Fluid and Coordinated: No Fine Motor Movements are Fluid and Coordinated: No Coordination and Movement Description: Deficits due to hemiparesis-acute L hemi; decreased propriocpeiton L LE Motor  Motor Motor: Hemiplegia;Ataxia Motor - Discharge Observations: acutely L hemi, previous R hemi, pt reports intermittent R LE buckling causing 2 falls at home  Trunk/Postural Assessment  Cervical Assessment Cervical Assessment: Within Functional Limits Thoracic Assessment Thoracic Assessment: Within Functional Limits Lumbar Assessment Lumbar Assessment: Within Functional Limits Postural Control Postural Control: Deficits on evaluation Righting Reactions: Delayed Protective Responses: Delayed  Balance Balance Balance Assessed: Yes Static Sitting Balance Static Sitting - Balance Support: Feet supported Static Sitting - Level of Assistance: 7: Independent Dynamic Sitting Balance Dynamic Sitting - Balance Support: During functional activity Dynamic Sitting - Level of Assistance: 5: Stand by assistance (Sup-CGA) Dynamic Sitting - Balance Activities: Lateral lean/weight  shifting;Forward lean/weight shifting;Reaching for objects Static Standing Balance Static Standing - Balance Support: Bilateral upper extremity supported;During functional activity Static Standing - Level of Assistance: 5: Stand by assistance (CGA) Dynamic Standing Balance Dynamic Standing - Balance Support: During functional activity Dynamic Standing - Level of Assistance: 4: Min assist;5: Stand by assistance Dynamic Standing - Balance Activities: Lateral lean/weight shifting;Forward lean/weight shifting;Reaching for objects Extremity/Trunk Assessment RUE Assessment RUE Assessment: Exceptions to [facility] Active Range of Motion (AROM) Comments: WFL General Strength Comments: Grossly WFL; decreased FMC LUE Assessment LUE Assessment: Exceptions to [facility] Active Range of Motion (AROM) Comments: WFL General Strength  Comments: Mod dysmetria LUE Body System: Neuro Brunstrum levels for arm and hand: Arm;Hand Brunstrum level for arm: Stage V Relative Independence from Synergy Brunstrum level for hand: Stage VI Isolated joint movements  Care Tool Care Tool Self Care Eating   Eating Assist Level: Set up assist    Oral Care    Oral Care Assist Level: Supervision/Verbal cueing    Bathing   Body parts bathed by patient: Right arm;Left arm;Chest;Abdomen;Front perineal area;Buttocks;Right upper leg;Left upper leg;Right lower leg;Left lower leg;Face     Assist Level: Contact Guard/Touching assist    Upper Body Dressing(including orthotics)   What is the patient wearing?: Pull over shirt   Assist Level: Set up assist    Lower Body Dressing (excluding footwear)   What is the patient wearing?: Pants;Underwear/pull up Assist for lower body dressing: Contact Guard/Touching assist    Putting on/Taking off footwear   What is the patient wearing?: Non-skid slipper socks;Shoes Assist for footwear: Minimal Assistance - Patient > 75%       Care Tool Toileting Toileting activity Toileting Activity did not occur (Clothing management and hygiene only): N/A (no void or bm)       Care Tool Bed Mobility Roll left and right activity        Sit to lying activity        Lying to sitting on side of bed activity         Care Tool Transfers Sit to stand transfer        Chair/bed transfer         Toilet transfer   Assist Level: Minimal Assistance - Patient > 75%     Care Tool Cognition  Expression of Ideas and Wants Expression of Ideas and Wants: 4. Without difficulty (complex and basic) - expresses complex messages without difficulty and with speech that is clear and easy to understand  Understanding Verbal and Non-Verbal Content Understanding Verbal and Non-Verbal Content: 4. Understands (complex and basic) - clear comprehension without cues or repetitions   Memory/Recall Ability Memory/Recall  Ability : Current season;That he or she is in a hospital/hospital unit   Refer to Care Plan for Long Term Goals  SHORT TERM GOAL WEEK 1 OT Short Term Goal 1 (Week 1): STGs=LTGs due to patient's estimated length of stay.  Recommendations for other services: None    Skilled Therapeutic Intervention Pt greeted sitting at EOB for skilled OT session with focus on comprehensive OT evaluation.   Pain: Pt with no reports of pain, OT offering intermediate rest breaks and positioning suggestions throughout session to address pain/fatigue and maximize participation/safety in session.   Session began with introduction to OT role, OT POC, and general orientation to rehab unit/schedule. Pt completes full-body shower with levels of assistance noted below. See details above for L-hemibody deficits and current impact on functioning.   Pt remained sitting in recliner with 4Ps  assessed and immediate needs met. Pt continues to be appropriate for skilled OT intervention to promote further functional independence in ADLs/IADLs.   ADL ADL Eating: Set up Where Assessed-Eating: Bed level Grooming: Setup Where Assessed-Grooming: Edge of bed Upper Body Bathing: Supervision/safety;Setup Where Assessed-Upper Body Bathing: Shower Lower Body Bathing: Contact guard Where Assessed-Lower Body Bathing: Shower Upper Body Dressing: Supervision/safety;Setup Where Assessed-Upper Body Dressing: Edge of bed Lower Body Dressing: Contact guard Where Assessed-Lower Body Dressing: Edge of bed Toileting: Contact guard;Minimal assistance Where Assessed-Toileting: Toilet;Bedside Commode Toilet Transfer: Minimal assistance Toilet Transfer Method: Proofreader: Grab bars;Bedside commode Film/video editor: Minimal assistance Film/video editor Method: Designer, industrial/product: Scientist, research (medical) Sit to Stand: Minimal Assistance - Patient > 75% Stand to Sit:  Minimal Assistance - Patient > 75%   Discharge Criteria: Patient will be discharged from OT if patient refuses treatment 3 consecutive times without medical reason, if treatment goals not met, if there is a change in medical status, if patient makes no progress towards goals or if patient is discharged from hospital.  The above assessment, treatment plan, treatment alternatives and goals were discussed and mutually agreed upon: by patient  Lou Cal, OTR/L, MSOT  11/18/2023, 10:20 AM

## 2023-11-18 NOTE — Plan of Care (Signed)
  Problem: RH SAFETY Goal: RH STG ADHERE TO SAFETY PRECAUTIONS W/ASSISTANCE/DEVICE Description: STG Adhere to Safety Precautions With cues Assistance/Device. Outcome: Progressing   Problem: RH KNOWLEDGE DEFICIT Goal: RH STG INCREASE KNOWLEDGE OF HYPERTENSION Description: Patient and spouse will be able to manage HTN with educational resources for medications and dietary modifications independently Outcome: Progressing   Problem: Nutritional: Goal: Maintenance of adequate nutrition will improve Outcome: Progressing   Problem: Skin Integrity: Goal: Risk for impaired skin integrity will decrease Outcome: Progressing

## 2023-11-18 NOTE — Progress Notes (Signed)
[facility] Progress Note   Subjective:   Seen in room - working with PT, going well. No CP/dyspnea. For HD later today.  Objective Vitals:   11/17/23 1351 11/17/23 2007 11/18/23 0526  BP: 132/71 (!) 144/73 139/75  Pulse: 76 72 71  Resp: 18 18 18   Temp: 98.1 F (36.7 C) 97.7 F (36.5 C) 97.8 F (36.6 C)  TempSrc: Oral    SpO2: 96% 97% 98%  Weight: 106.1 kg  104.1 kg  Height: 5\' 9"  (1.753 m)     Physical Exam General: Well appearing man, NAD. Room air Heart: RRR; no murmur Lungs: CTA anteriorly Abdomen: soft Extremities: no LE edema Dialysis Access: RUE AVF +t/b  Additional Objective Labs: Basic Metabolic Panel: Recent Labs  Lab 11/15/23 0953 11/15/23 0958 11/16/23 0613 11/18/23 0515  NA 135 135 132* 134*  K 4.2 4.1 3.8 3.9  CL 94* 94* 91* 95*  CO2 27  --  27 25  GLUCOSE 155* 148* 185* 166*  BUN 40* 37* 54* 50*  CREATININE 8.38* 8.60* 10.01* 10.21*  CALCIUM 8.6*  --  8.9 8.9  PHOS  --   --   --  6.6*   Liver Function Tests: Recent Labs  Lab 11/15/23 0953 11/18/23 0515  AST 16  --   ALT 15  --   ALKPHOS 101  --   BILITOT 0.6  --   PROT 6.0*  --   ALBUMIN 3.0* 3.3*   CBC: Recent Labs  Lab 11/15/23 0953 11/15/23 0958 11/16/23 0613 11/18/23 0515  WBC 7.3  --  6.8 7.2  NEUTROABS 4.7  --   --   --   HGB 11.1* 11.9* 12.0* 12.2*  HCT 37.4* 35.0* 36.8* 37.8*  MCV 91.2  --  82.7 83.1  PLT 224  --  271 276   Studies/Results: ECHOCARDIOGRAM COMPLETE Result Date: 11/16/2023    ECHOCARDIOGRAM REPORT   Patient Name:   Bobby Sampson Date of Exam: 11/16/2023 Medical Rec #:  [id number]  Height:       69.0 in Accession #:    [id number] Weight:       217.4 lb Date of Birth:  14-Jan-1961 BSA:          2.140 m Patient Age:    62 years   BP:           169/77 mmHg Patient Gender: M          HR:           65 bpm. Exam Location:  Inpatient Procedure: 2D Echo, Color Doppler, Cardiac Doppler and Intracardiac            Opacification Agent Indications:     Stroke i63.9  History:        Patient has prior history of Echocardiogram examinations, most                 recent 12/07/2022. CHF; Risk Factors:Hypertension, Diabetes and                 Sleep Apnea.  Sonographer:    Irving Burton Senior RDCS Referring Phys: Kalman Drape WOLFE IMPRESSIONS  1. Left ventricular ejection fraction, by estimation, is 55 to 60%. The left ventricle has normal function. The left ventricle has no regional wall motion abnormalities. Left ventricular diastolic parameters are consistent with Grade I diastolic dysfunction (impaired relaxation).  2. Right ventricular systolic function is normal. The right ventricular size is normal. Tricuspid regurgitation signal is inadequate for assessing PA pressure.  3. The mitral valve is normal in structure. No evidence of mitral valve regurgitation. No evidence of mitral stenosis.  4. The aortic valve is tricuspid. Aortic valve regurgitation is not visualized. No aortic stenosis is present.  5. The inferior vena cava is normal in size with greater than 50% respiratory variability, suggesting right atrial pressure of 3 mmHg. FINDINGS  Left Ventricle: Left ventricular ejection fraction, by estimation, is 55 to 60%. The left ventricle has normal function. The left ventricle has no regional wall motion abnormalities. Definity contrast agent was given IV to delineate the left ventricular  endocardial borders. The left ventricular internal cavity size was normal in size. There is no left ventricular hypertrophy. Left ventricular diastolic parameters are consistent with Grade I diastolic dysfunction (impaired relaxation). Right Ventricle: The right ventricular size is normal. No increase in right ventricular wall thickness. Right ventricular systolic function is normal. Tricuspid regurgitation signal is inadequate for assessing PA pressure. Left Atrium: Left atrial size was normal in size. Right Atrium: Right atrial size was normal in size. Pericardium: There is no evidence  of pericardial effusion. Mitral Valve: The mitral valve is normal in structure. Mild mitral annular calcification. No evidence of mitral valve regurgitation. No evidence of mitral valve stenosis. Tricuspid Valve: The tricuspid valve is normal in structure. Tricuspid valve regurgitation is not demonstrated. Aortic Valve: The aortic valve is tricuspid. Aortic valve regurgitation is not visualized. No aortic stenosis is present. Pulmonic Valve: The pulmonic valve was normal in structure. Pulmonic valve regurgitation is not visualized. Aorta: The aortic root is normal in size and structure. Venous: The inferior vena cava is normal in size with greater than 50% respiratory variability, suggesting right atrial pressure of 3 mmHg. IAS/Shunts: No atrial level shunt detected by color flow Doppler.  LEFT VENTRICLE PLAX 2D LVIDd:         4.20 cm   Diastology LVIDs:         3.20 cm   LV e' medial:    6.53 cm/s LV PW:         1.10 cm   LV E/e' medial:  10.2 LV IVS:        1.40 cm   LV e' lateral:   6.74 cm/s LVOT diam:     2.10 cm   LV E/e' lateral: 9.9 LV SV:         66 LV SV Index:   31 LVOT Area:     3.46 cm  RIGHT VENTRICLE RV S prime:     9.14 cm/s TAPSE (M-mode): 1.9 cm LEFT ATRIUM             Index        RIGHT ATRIUM           Index LA diam:        4.20 cm 1.96 cm/m   RA Area:     17.60 cm LA Vol (A2C):   59.5 ml 27.80 ml/m  RA Volume:   49.00 ml  22.89 ml/m LA Vol (A4C):   62.1 ml 29.01 ml/m LA Biplane Vol: 63.5 ml 29.67 ml/m  AORTIC VALVE LVOT Vmax:   79.80 cm/s LVOT Vmean:  61.900 cm/s LVOT VTI:    0.191 m  AORTA Ao Root diam: 3.30 cm Ao Asc diam:  2.80 cm MITRAL VALVE MV Area (PHT): 4.36 cm    SHUNTS MV Decel Time: 174 msec    Systemic VTI:  0.19 m MV E velocity: 66.60 cm/s  Systemic Diam: 2.10 cm MV  A velocity: 63.40 cm/s MV E/A ratio:  1.05 Dalton McleanMD Electronically signed by Wilfred Lacy Signature Date/Time: 11/16/2023/11:22:36 AM    Final    Medications:  anticoagulant sodium citrate       aspirin  81 mg Oral Daily   atorvastatin  80 mg Oral QHS   buPROPion  150 mg Oral Daily   calcitRIOL  0.5 mcg Oral Q M,W,F-1800   calcium acetate  667 mg Oral BID WC   clopidogrel  75 mg Oral Daily   dorzolamide-timolol  1 drop Both Eyes BID   FLUoxetine  20 mg Oral QHS   heparin  5,000 Units Subcutaneous Q8H   insulin aspart  5 Units Subcutaneous TID WC   insulin glargine-yfgn  15 Units Subcutaneous BID   latanoprost  1 drop Left Eye QHS   metoprolol succinate  25 mg Oral Daily   pantoprazole  40 mg Oral QHS    Dialysis Orders: MWF - [facility] ([address]) - [facility] team 4hr, 450/800, EDW 94.8kg, 2K/2.5Ca, AVF, no heparin - Calcitriol 0.14mcg q HD - Mircera q 4 week - Venofer 100mg  x 10 (in middle of this) - 2.5L UFG max per cardiologist   Assessment/Plan: 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 Was out of window for thrombolytics per notes. 2. ESRD: Usual MWF schedule - for HD later today. 3. HTN/volume:  BP better, no edema on exam. UF as tolerated - 2.5L UFG max per notes. 4. Anemia: Hgb 12.2, no ESA for now. 5. Secondary hyperparathyroidism:  Ca ok, Phos high - increase Phoslo dose. 6. T2DM 7. Hx prior CVA: On statin + plavix 8. HFpEF  Ozzie Hoyle, Cordelia Poche 11/18/2023, 10:17 AM  [employer]

## 2023-11-18 NOTE — Discharge Instructions (Addendum)
Inpatient Rehab Discharge Instructions  Janel Pontiff Discharge date and time:  11/26/23  Activities/Precautions/ Functional Status: Activity: no lifting, driving, or strenuous exercise until cleared by MD Diet: renal diet/Carb Modified. Need to limit fluids to 5 cups per day.  Wound Care: none needed   Functional status:  ___ No restrictions     ___ Walk up steps independently _X__ 24/7 supervision/assistance   ___ Walk up steps with assistance ___ Intermittent supervision/assistance  ___ Bathe/dress independently ___ Walk with walker     _X__ Bathe/dress with assistance ___ Walk Independently    ___ Shower independently _X__ Walk with assistance    ___ Shower with assistance _X_ No alcohol     ___ Return to work/school ________   Special Instructions:  Wear knee brace for support when walking 2.  Continue to use your dexcom and sliding scale insulin as at home. Follow up with primary care for input on any insulin adjustment.     STROKE/TIA DISCHARGE INSTRUCTIONS SMOKING Cigarette smoking nearly doubles your risk of having a stroke & is the single most alterable risk factor  If you smoke or have smoked in the last 12 months, you are advised to quit smoking for your health. Most of the excess cardiovascular risk related to smoking disappears within a year of stopping. Ask you doctor about anti-smoking medications [place] Quit Line: 1-800-QUIT NOW Free Smoking Cessation Classes [phone]  CHOLESTEROL Know your levels; limit fat & cholesterol in your diet  Lipid Panel     Component Value Date/Time   CHOL 114 11/16/2023 0613   TRIG 114 11/16/2023 0613   HDL 38 (L) 11/16/2023 0613   CHOLHDL 3.0 11/16/2023 0613   VLDL 23 11/16/2023 0613   LDLCALC 53 11/16/2023 0613     Many patients benefit from treatment even if their cholesterol is at goal. Goal: Total Cholesterol (CHOL) less than 160 Goal:  Triglycerides (TRIG) less than 150 Goal:  HDL greater than 40 Goal:  LDL (LDLCALC)  less than 100   BLOOD PRESSURE American Stroke Association blood pressure target is less that 120/80 mm/Hg  Your discharge blood pressure is:  BP: 104/69 Monitor your blood pressure Limit your salt and alcohol intake Many individuals will require more than one medication for high blood pressure  DIABETES (A1c is a blood sugar average for last 3 months) Goal HGBA1c is under 7% (HBGA1c is blood sugar average for last 3 months)  Diabetes: Diagnosis of diabetes:  Your A1c:6.5 %    Lab Results  Component Value Date   HGBA1C 6.5 (H) 11/15/2023    Your HGBA1c can be lowered with medications, healthy diet, and exercise. Check your blood sugar as directed by your physician Call your physician if you experience unexplained or low blood sugars.  PHYSICAL ACTIVITY/REHABILITATION Goal is 30 minutes at least 4 days per week  Activity: Increase activity slowly, Therapies: Physical Therapy: Outpatient and Occupational Therapy: Outpatient Return to work: When cleared by MD Activity decreases your risk of heart attack and stroke and makes your heart stronger.  It helps control your weight and blood pressure; helps you relax and can improve your mood. Participate in a regular exercise program. Talk with your doctor about the best form of exercise for you (dancing, walking, swimming, cycling).  DIET/WEIGHT Goal is to maintain a healthy weight  Your discharge diet is:  Diet Order             Diet renal with fluid restriction Fluid restriction: 1200 mL Fluid; Room  service appropriate? Yes; Fluid consistency: Thin  Diet effective now                 Thin liquids Your height is:  Height: 5\' 9"  (175.3 cm) Your current weight is: Weight: 84.9 kg Your Body Mass Index (BMI) is:  BMI (Calculated): 27.63 Following the type of diet specifically designed for you will help prevent another stroke. Your goal weight range is:   Your goal Body Mass Index (BMI) is 19-24. Healthy food habits can help reduce 3 risk  factors for stroke:  High cholesterol, hypertension, and excess weight.  RESOURCES Stroke/Support Group:  Call [phone]   STROKE EDUCATION PROVIDED/REVIEWED AND GIVEN TO PATIENT Stroke warning signs and symptoms How to activate emergency medical system (call 911). Medications prescribed at discharge. Need for follow-up after discharge. Personal risk factors for stroke. Pneumonia vaccine given: No Flu vaccine given: No My questions have been answered, the writing is legible, and I understand these instructions.  I will adhere to these goals & educational materials that have been provided to me after my discharge from the hospital.     My questions have been answered and I understand these instructions. I will adhere to these goals and the provided educational materials after my discharge from the hospital.  Patient/Caregiver Signature _______________________________ Date __________  Clinician Signature _______________________________________ Date __________  Please bring this form and your medication list with you to all your follow-up doctor's appointments.

## 2023-11-19 DIAGNOSIS — N186 End stage renal disease: Secondary | ICD-10-CM | POA: Diagnosis not present

## 2023-11-19 DIAGNOSIS — I10 Essential (primary) hypertension: Secondary | ICD-10-CM | POA: Diagnosis not present

## 2023-11-19 DIAGNOSIS — G4733 Obstructive sleep apnea (adult) (pediatric): Secondary | ICD-10-CM

## 2023-11-19 DIAGNOSIS — I6322 Cerebral infarction due to unspecified occlusion or stenosis of basilar arteries: Secondary | ICD-10-CM | POA: Diagnosis not present

## 2023-11-19 DIAGNOSIS — I5032 Chronic diastolic (congestive) heart failure: Secondary | ICD-10-CM | POA: Diagnosis not present

## 2023-11-19 LAB — GLUCOSE, CAPILLARY
Glucose-Capillary: 185 mg/dL — ABNORMAL HIGH (ref 70–99)
Glucose-Capillary: 269 mg/dL — ABNORMAL HIGH (ref 70–99)
Glucose-Capillary: 168 mg/dL — ABNORMAL HIGH (ref 70–99)
Glucose-Capillary: 173 mg/dL — ABNORMAL HIGH (ref 70–99)

## 2023-11-19 NOTE — Evaluation (Signed)
Physical Therapy Assessment and Plan  Patient Details  Name: Bobby Sampson MRN: [id number] Date of Birth: 1961/06/08  PT Diagnosis: Abnormal posture, Abnormality of gait, Difficulty walking, Impaired sensation, and Muscle weakness Rehab Potential: Good ELOS: 12-14 days   Today's Date: 11/18/2023 PT Individual Time: 1101 -1158    Pt total Time 57   Hospital Problem: Principal Problem:   CVA (cerebral vascular accident) [facility])   Past Medical History:  Past Medical History:  Diagnosis Date   Diabetes mellitus without complication (HCC)    Hypertension    Neuropathy    Renal disorder    Stroke (HCC)    2016   Past Surgical History:  Past Surgical History:  Procedure Laterality Date   INTRAMEDULLARY (IM) NAIL INTERTROCHANTERIC Right 11/02/2021   Procedure: INTRAMEDULLARY (IM) NAIL INTERTROCHANTRIC;  Surgeon: Juanell Fairly, MD;  Location: [facility] ORS;  Service: Orthopedics;  Laterality: Right;   TONSILLECTOMY      Assessment & Plan Clinical Impression: Patient is a 62 y.o. year old male with Bobby Sampson is a 62 year old male who awoke on the morning of 11/15/2023 with left-sided numbness and weakness which is different from his baseline. He was transported to [facility] emergency department via [facility] who initiated code stroke. 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 Code stroke CT head with no acute process, Aspects 10. 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 MRI revealed acute infarct in the right dorsal pons. Permissive hypertension allowed. Loaded with aspirin and Plavix now on aspirin 81 mg daily and Plavix 75 mg daily for 3 weeks followed by Plavix alone. 2D echo with ejection fraction of 55 to 60%, grade 1 diastolic dysfunction. Patient has a history of hypertension, CHF,  diabetes mellitus (hemoglobin A1c = 6.5%), end-stage renal disease on hemodialysis Mondays, Wednesdays and Fridays. Therapy consults obtained. Tolerating renal diet with fluid restriction 1200 mL. Thin liquids. Patient requires mod assist for ambulation with rolling walker, mod to min assist for sit to stand and mod assist for transfers. He lives with his wife in a 1 level home with ramped entrance. He attended the [facility] regularly prior to admission, but has had some falls recently. His wife is a CNA and she drives, does IADLs.The patient requires inpatient medicine and rehabilitation evaluations and services for ongoing dysfunction secondary to right dorsal pontine infarct.    Patient currently requires min with mobility secondary to muscle weakness, decreased cardiorespiratoy endurance, impaired timing and sequencing, unbalanced muscle activation, ataxia, decreased coordination, and decreased motor planning, decreased midline orientation, decreased attention to left, and decreased motor planning, and decreased sitting balance, decreased standing balance, decreased postural control, hemiplegia, and decreased balance strategies.  Prior to hospitalization, patient was modified independent  with mobility and lived with Spouse in a House home.  Home access is  Ramped entrance.  Patient will benefit from skilled PT intervention to maximize safe functional mobility, minimize fall risk, and decrease caregiver burden for planned discharge home with 24 hour supervision.  Anticipate patient will benefit from follow up HH at discharge.  PT - End of Session Activity Tolerance: Tolerates 30+ min activity with multiple rests Endurance Deficit: Yes PT Assessment Rehab Potential (ACUTE/IP ONLY): Good PT Barriers to Discharge: Hemodialysis;Decreased caregiver support PT Patient demonstrates impairments in the following area(s): Balance;Behavior;Edema;Endurance;Motor;Perception;Safety;Skin Integrity;Sensory PT Transfers  Functional Problem(s): Bed  Mobility;Bed to Chair;Car;Furniture PT Locomotion Functional Problem(s): Ambulation;Wheelchair Mobility;Stairs PT Plan PT Intensity: Minimum of 1-2 x/day ,45 to 90 minutes PT Frequency: 5 out of 7 days PT Duration Estimated Length of Stay: 12-14 days PT Treatment/Interventions: Ambulation/gait training;Discharge planning;Functional mobility training;Psychosocial support;Therapeutic Activities;Visual/perceptual remediation/compensation;Balance/vestibular training;Disease management/prevention;Neuromuscular re-education;Skin care/wound management;Therapeutic Exercise;Wheelchair propulsion/positioning;Cognitive remediation/compensation;DME/adaptive equipment instruction;Pain management;UE/LE Strength taining/ROM;Splinting/orthotics;Community reintegration;Functional electrical stimulation;Patient/family education;Stair training;UE/LE Coordination activities PT Transfers Anticipated Outcome(s): supervision PT Locomotion Anticipated Outcome(s): supervision PT Recommendation Recommendations for Other Services: Neuropsych consult Follow Up Recommendations: Home health PT Patient destination: Home Equipment Recommended: To be determined Equipment Details: pt has RW, WC, cane   PT Evaluation Precautions/Restrictions Precautions Precautions: Fall Precaution Comments: L hemi; residual R-sided hemi, visual deficits at baseline; HD MWF; fluid restriction Restrictions Weight Bearing Restrictions Per Provider Order: No Pain Interference Pain Interference Pain Effect on Sleep: 1. Rarely or not at all Pain Interference with Therapy Activities: 1. Rarely or not at all Pain Interference with Day-to-Day Activities: 1. Rarely or not at all Home Living/Prior Functioning Home Living Available Help at Discharge: Family;Available 24 hours/day;Personal care attendant Type of Home: House Home Access: Ramped entrance Home Layout: One level Bathroom Shower/Tub: Teacher, music: Handicapped height Bathroom Accessibility: Yes  Lives With: Spouse Prior Function Level of Independence: Requires assistive device for independence;Needs assistance with ADLs;Needs assistance with homemaking Dressing: Minimal  Able to Take Stairs?: No Driving: No Vocation: Retired Optometrist - History Ability to See in Adequate Light: 2 Moderately impaired Vision - Assessment Eye Alignment: Within Functional Limits Ocular Range of Motion: Within Functional Limits Tracking/Visual Pursuits: Able to track stimulus in all quads without difficulty Saccades: Within functional limits Convergence: Within functional limits Perception Perception: Within Functional Limits Praxis Praxis: Impaired Praxis Impairment Details: Motor planning  Cognition Overall Cognitive Status: Within Functional Limits for tasks assessed Arousal/Alertness: Awake/alert Orientation Level: Oriented X4 Year: 2024 Month: December Day of Week: Correct Memory: Appears intact Awareness: Appears intact Problem Solving: Appears intact Safety/Judgment: Impaired Comments: Slight impulsivity Sensation Sensation Light Touch: Impaired by gross assessment Peripheral sensation comments: Numbness/tingling in LUE (since onset of CVA-pt reports it is improving); pt able to detect light touch in L LE but reports Numbness/tingling in LLE--unable to detect light touch in B LE 2/2 neuropathy, pt denies any N&T R LE.  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 Light Touch Impaired Details: Impaired LUE;Impaired LLE Hot/Cold: Not tested Proprioception: Not tested Stereognosis: Not tested Coordination Gross Motor Movements are Fluid and Coordinated: No Fine Motor Movements are Fluid and Coordinated: No Coordination and Movement Description: Deficits due to hemiparesis-acute L hemi; decreased proprioception L LE Motor  Motor Motor:  Hemiplegia;Ataxia Motor - Discharge Observations: acutely L hemi, previous R hemi, pt reports intermittent R LE buckling causing 2 falls at home   Trunk/Postural Assessment  Cervical Assessment Cervical Assessment: Within Functional Limits Thoracic Assessment Thoracic Assessment: Within Functional Limits Lumbar Assessment Lumbar Assessment: Within Functional Limits Postural Control Postural Control: Deficits on evaluation Righting Reactions: Delayed  Balance Balance Balance Assessed: Yes Static Sitting Balance Static Sitting - Balance Support: Feet supported Dynamic Sitting Balance Dynamic Sitting - Balance Support: During functional activity Dynamic Sitting - Level of Assistance: 5: Stand by assistance Dynamic Sitting - Balance Activities: Lateral lean/weight shifting;Forward lean/weight shifting;Reaching for objects Static Standing Balance Static Standing - Balance Support: Bilateral upper extremity supported;During functional activity Static Standing - Level of Assistance: 5: Stand by assistance (CGA) Dynamic Standing Balance Dynamic Standing - Balance  Support: During functional activity Dynamic Standing - Level of Assistance: 4: Min assist Dynamic Standing - Balance Activities: Lateral lean/weight shifting;Forward lean/weight shifting;Reaching for objects Extremity Assessment  RLE Assessment RLE Assessment: Exceptions to [place] General Strength Comments: grossly 3+/5 LLE Assessment LLE Assessment: Exceptions to [facility] General Strength Comments: grossly 3+/5  Care Tool Care Tool Bed Mobility Roll left and right activity   Roll left and right assist level: Supervision/Verbal cueing    Sit to lying activity   Sit to lying assist level: Supervision/Verbal cueing    Lying to sitting on side of bed activity   Lying to sitting on side of bed assist level: the ability to move from lying on the back to sitting on the side of the bed with no back support.: Supervision/Verbal  cueing     Care Tool Transfers Sit to stand transfer   Sit to stand assist level: Minimal Assistance - Patient > 75%    Chair/bed transfer   Chair/bed transfer assist level: Minimal Assistance - Patient > 75%    Car transfer   Car transfer assist level: Minimal Assistance - Patient > 75%      Care Tool Locomotion Ambulation   Assist level: Minimal Assistance - Patient > 75% Assistive device: Walker-rolling Max distance: 88  Walk 10 feet activity   Assist level: Minimal Assistance - Patient > 75% Assistive device: Walker-rolling   Walk 50 feet with 2 turns activity   Assist level: Minimal Assistance - Patient > 75% Assistive device: Walker-rolling  Walk 150 feet activity Walk 150 feet activity did not occur: Safety/medical concerns (fatigue)      Walk 10 feet on uneven surfaces activity Walk 10 feet on uneven surfaces activity did not occur: Safety/medical concerns (fatigue)      Stairs Stair activity did not occur: Refused        Walk up/down 1 step activity Walk up/down 1 step or curb (drop down) activity did not occur: Refused      Walk up/down 4 steps activity Walk up/down 4 steps activity did not occur: Refused      Walk up/down 12 steps activity Walk up/down 12 steps activity did not occur: Refused      Pick up small objects from floor   Pick up small object from the floor assist level: Total Assistance - Patient < 25%    Wheelchair Is the patient using a wheelchair?: Yes Type of Wheelchair: Manual   Wheelchair assist level: Supervision/Verbal cueing Max wheelchair distance: 150  Wheel 50 feet with 2 turns activity   Assist Level: Supervision/Verbal cueing  Wheel 150 feet activity   Assist Level: Supervision/Verbal cueing    Refer to Care Plan for Long Term Goals  SHORT TERM GOAL WEEK 1 PT Short Term Goal 1 (Week 1): pt will ambulate 150 feet with LRAD and CGA PT Short Term Goal 2 (Week 1): pt will perform sit to stand with LRAD and CGA PT Short  Term Goal 3 (Week 1): pt will perform bed to chair transfer with LRAD and CGA  Recommendations for other services: Neuropsych  Skilled Therapeutic Intervention Mobility Bed Mobility Bed Mobility: Rolling Right;Rolling Left;Supine to Sit;Sit to Supine Rolling Right: Supervision/verbal cueing Rolling Left: Supervision/Verbal cueing Supine to Sit: Supervision/Verbal cueing Sit to Supine: Supervision/Verbal cueing Transfers Transfers: Sit to Stand;Stand to Sit;Stand Pivot Transfers Sit to Stand: Minimal Assistance - Patient > 75% Stand to Sit: Minimal Assistance - Patient > 75% Stand Pivot Transfers: Minimal Assistance - Patient > 75% Stand Pivot  Transfer Details: Verbal cues for technique;Verbal cues for precautions/safety;Verbal cues for gait pattern;Verbal cues for safe use of DME/AE Transfer (Assistive device): Rolling walker Locomotion  Gait Ambulation: Yes Gait Assistance: Minimal Assistance - Patient > 75% Gait Distance (Feet): 88 Feet Assistive device: Rolling walker Gait Assistance Details: Verbal cues for gait pattern;Verbal cues for safe use of DME/AE;Verbal cues for precautions/safety Gait Gait: Yes Gait Pattern: Impaired Gait Pattern: Ataxic;Left steppage;Step-through pattern;Decreased stride length;Decreased weight shift to left;Decreased stance time - left;Poor foot clearance - right Gait velocity: variable Stairs / Additional Locomotion Stairs: No Corporate treasurer: Yes Wheelchair Assistance: Doctor, general practice: Both upper extremities Wheelchair Parts Management: Needs assistance   Discharge Criteria: Patient will be discharged from PT if patient refuses treatment 3 consecutive times without medical reason, if treatment goals not met, if there is a change in medical status, if patient makes no progress towards goals or if patient is discharged from hospital.  Today's Interventions  Evaluation completed (see  details above and below) with education on PT POC and goals and individual treatment initiated with focus on transfer training.   Pt reports baseline R LE weakness 2/2 previous strokes with intermittent buckling in last 6 months resulting in falls with no injury.   Pt previously walking with RW predominately, and SPC intermittently.   Bed mobility supervision, Sit to stand and stand pivot transfer with no AD and mod A 2/2 retropulsion, min A with RW, verbal cues provided for technique and safety. Gait x88 feet with RW and min A. Pt refusing to attempt without RW at this time 2/2 fear. Will attempt again later session-pt demos decreased weight shift to L LE, decreased R LE foot clearance, steppage gait and ataxia L LE.   Pt supine in bed at end of session with all needs within reach and bed alarm on.   Treatment Session 2   Pt supine asleep in bed upon arrival. Pt awoken and agreeable to therapy. . Pt denies any pain.   Pt began performing therex, performed 2 reps of glute bridge prior to dialysis transport arriving to take pt to dialysis.   Pt supine in bed with dialysis transport at end of session.   Pt missed 75 minutes 2/2 dialysis.     The above assessment, treatment plan, treatment alternatives and goals were discussed and mutually agreed upon: by patient  Geoffry Paradise, PT, DPT  11/19/2023, 7:44 AM

## 2023-11-19 NOTE — Plan of Care (Signed)
  Problem: RH Balance Goal: LTG Patient will maintain dynamic standing with ADLs (OT) Description: LTG:  Patient will maintain dynamic standing balance with assist during activities of daily living (OT)  Flowsheets (Taken 11/19/2023 1524) LTG: Pt will maintain dynamic standing balance during ADLs with: Independent with assistive device   Problem: Sit to Stand Goal: LTG:  Patient will perform sit to stand in prep for activites of daily living with assistance level (OT) Description: LTG:  Patient will perform sit to stand in prep for activites of daily living with assistance level (OT) Flowsheets (Taken 11/19/2023 1524) LTG: PT will perform sit to stand in prep for activites of daily living with assistance level: Independent with assistive device   Problem: RH Bathing Goal: LTG Patient will bathe all body parts with assist levels (OT) Description: LTG: Patient will bathe all body parts with assist levels (OT) Flowsheets (Taken 11/19/2023 1524) LTG: Pt will perform bathing with assistance level/cueing: Independent with assistive device    Problem: RH Dressing Goal: LTG Patient will perform lower body dressing w/assist (OT) Description: LTG: Patient will perform lower body dressing with assist, with/without cues in positioning using equipment (OT) Flowsheets (Taken 11/19/2023 1524) LTG: Pt will perform lower body dressing with assistance level of: Minimal Assistance - Patient > 75%   Problem: RH Toileting Goal: LTG Patient will perform toileting task (3/3 steps) with assistance level (OT) Description: LTG: Patient will perform toileting task (3/3 steps) with assistance level (OT)  Flowsheets (Taken 11/19/2023 1524) LTG: Pt will perform toileting task (3/3 steps) with assistance level: Independent with assistive device   Problem: RH Functional Use of Upper Extremity Goal: LTG Patient will use RT/LT upper extremity as a (OT) Description: LTG: Patient will use right/left upper extremity as a  stabilizer/gross assist/diminished/nondominant/dominant level with assist, with/without cues during functional activity (OT) Flowsheets (Taken 11/19/2023 1524) LTG: Use of upper extremity in functional activities: LUE as diminished level LTG: Pt will use upper extremity in functional activity with assistance level of: Independent   Problem: RH Toilet Transfers Goal: LTG Patient will perform toilet transfers w/assist (OT) Description: LTG: Patient will perform toilet transfers with assist, with/without cues using equipment (OT) Flowsheets (Taken 11/19/2023 1524) LTG: Pt will perform toilet transfers with assistance level of: Independent with assistive device   Problem: RH Tub/Shower Transfers Goal: LTG Patient will perform tub/shower transfers w/assist (OT) Description: LTG: Patient will perform tub/shower transfers with assist, with/without cues using equipment (OT) Flowsheets (Taken 11/19/2023 1524) LTG: Pt will perform tub/shower stall transfers with assistance level of: Independent with assistive device

## 2023-11-19 NOTE — Progress Notes (Signed)
Inpatient Rehabilitation Care Coordinator Assessment and Plan Patient Details  Name: Bobby Sampson MRN: [id number] Date of Birth: 12-03-1960  Today's Date: 11/19/2023  Hospital Problems: Principal Problem:   CVA (cerebral vascular accident) [facility])  Past Medical History:  Past Medical History:  Diagnosis Date   Diabetes mellitus without complication (HCC)    Hypertension    Neuropathy    Renal disorder    Stroke [facility])    2016   Past Surgical History:  Past Surgical History:  Procedure Laterality Date   INTRAMEDULLARY (IM) NAIL INTERTROCHANTERIC Right 11/02/2021   Procedure: INTRAMEDULLARY (IM) NAIL INTERTROCHANTRIC;  Surgeon: Juanell Fairly, MD;  Location: [facility] ORS;  Service: Orthopedics;  Laterality: Right;   TONSILLECTOMY     Social History:  reports that he has never smoked. He has never used smokeless tobacco. He reports that he does not currently use alcohol. He reports that he does not currently use drugs.  Family / Support Systems Marital Status: Married Patient Roles: Spouse, Parent Spouse/Significant Other: Angelique Blonder [phone] Children: Nicolette-daughter [phone] Other Supports: Friends Anticipated Caregiver: Angelique Blonder Ability/Limitations of Caregiver: Wife works as a Lawyer and daughter works third shift Medical laboratory scientific officer: Other (Comment) (Family will need to come up with a plan for pt) Family Dynamics: Close knit family who pull together when one is in need. Pt hopes to be as independent as possible befre leaving here.  Social History Preferred language: English Religion: Unknown Cultural Background: No issues Education: HS Health Literacy - How often do you need to have someone help you when you read instructions, pamphlets, or other written material from your doctor or pharmacy?: Never Writes: Yes Employment Status: Disabled Marine scientist Issues: No issues Guardian/Conservator: none-according to MD pt is capable of making his own decisions  while here   Abuse/Neglect Abuse/Neglect Assessment Can Be Completed: Yes Physical Abuse: Denies Verbal Abuse: Denies Sexual Abuse: Denies Exploitation of patient/patient's resources: Denies Self-Neglect: Denies  Patient response to: Social Isolation - How often do you feel lonely or isolated from those around you?: Never  Emotional Status Pt's affect, behavior and adjustment status: Pt is upset this happened when he was suppose to have a stress test in preparation for kidney transplant. He was independent with a rollator and hopes to get back to this level. He is alone at times while family works or sleeps Recent Psychosocial Issues: other health issues Psychiatric History: Hx-depression takes medications for this and finds it helpful. Would benefit from seeing neuro-psych while here. Have placed on list to be seen Substance Abuse History: No issues  Patient / Family Perceptions, Expectations & Goals Pt/Family understanding of illness & functional limitations: Pt can explain his stroke and deficits, he talks with the MD involved and feeels understands the process going forward. He is dissapointed it happened while trying to get on the transplant list and hopes it does not delay for long being back on it Premorbid pt/family roles/activities: husband, father, retiree, dialysis pt, friend, etc Anticipated changes in roles/activities/participation: resume Pt/family expectations/goals: Pt states: " I hope to be back to my independent level like I was at home before this."  [employer]: Other (Comment) ([facility] M, W,F uses transport for this) Premorbid Home Care/DME Agencies: Other (Comment) (has rollaotr, rw, tusb seat, scooter, wc, ttb) Transportation available at discharge: family and [facility] transport to HD Is the patient able to respond to transportation needs?: Yes In the past 12 months, has lack of transportation kept you from medical appointments  or from getting medications?:  No In the past 12 months, has lack of transportation kept you from meetings, work, or from getting things needed for daily living?: No Resource referrals recommended: Neuropsychology  Discharge Planning Living Arrangements: Spouse/significant other, Children Support Systems: Spouse/significant other, Children, Other relatives Type of Residence: Private residence Insurance Resources: Media planner (specify), Medicaid (specify county) (UHC_Medicare) Financial Resources: SSD, Family Support Financial Screen Referred: No Living Expenses: Own Money Management: Patient, Spouse Does the patient have any problems obtaining your medications?: No Home Management: wife Patient/Family Preliminary Plans: return home with wife who does work as a Lawyer and daughter who works third shift. At times he will be alone, He does have an aide for three hours three days a week and two hours on the weekends. At times will be alone. Care Coordinator Barriers to Discharge: Decreased caregiver support, Insurance for SNF coverage, Hemodialysis Care Coordinator Anticipated Follow Up Needs: HH/OP  Clinical Impression Pleasant gentleman who is motivated to do well and recover from this stroke. His family is supportive but do work and he does have a PCS aide 3 hours x three days and 2 hours on weekend. Work on discharge needs, may be alone at times  Lucy Chris 11/19/2023, 11:06 AM

## 2023-11-19 NOTE — Progress Notes (Signed)
Physical Therapy Session Note  Patient Details  Name: Bobby Sampson MRN: [id number] Date of Birth: [birth year]   Today's Date: 11/19/2023 PT Individual Time: 4098-1191 PT Individual Time Calculation (min): 58 min   Short Term Goals: Week 1:  PT Short Term Goal 1 (Week 1): pt will ambulate 150 feet with LRAD and CGA PT Short Term Goal 2 (Week 1): pt will perform sit to stand with LRAD and CGA PT Short Term Goal 3 (Week 1): pt will perform bed to chair transfer with LRAD and CGA  Skilled Therapeutic Interventions/Progress Updates:   Pt seated in recliner upon arrival. Pt agreeable to therapy. Pt denies any pain.   Pt reports he is very tired today from dialysis yesterday.   Discussed pt requirements for dialysis at home: Pt reports he ambulates ~20 feet with RW on paved pathway to dialysis transportation.   Pt performed  Stand pivot transfer mod A with RW, verbal cues provided to slow down, retropulsion with posterior stepping to WC.   Pt Donned leg rests with HOH A, and max verbal cues for technique and location 2/2 visual deficits. Transported pt dependent in [facility] for energy conservation  Pt performed sit to stand throughout session with min-mod A with RW, verbal cues provided for L LE positioning as pt demos tendency to place under wheelchair and rock wheelchair backwards without cues.   Donned 4# ankle weight around L LE, Pt ambulated 147 feet with RW and min-mod A with fatigue. Verbal cues provided for continuous movement of RW, and heel toe pattern; pt continues to demo L LE steppage and ataxia but mildly improved with ankle weight.   Pt performed sit to stand x5 with min-mod A with no AD, verbal cues provided for equal weight shift as pt demos heavy weight shift to L LE. Vebral cues provided for LE positioning prior to standing and technqiue for stand to sit.   Pt performed lateral stepping x 20 feet bilaterally, and backwards stepping 3x10 feet in // bars, with heavy dependence on  UE and min A, verbal cues provided for technique and for upright posture.   Pt ambulated 3 trails on colored dots as visual targets for step length and step width, verbal cues provided for continuous movement of RW, and heel toe pattern.   Pt seated in recliner and end of session with all needs within reach and bed alarm on.    Therapy Documentation Precautions:  Precautions Precautions: Fall Precaution Comments: L hemi; residual R-sided hemi, visual deficits at baseline; HD MWF; fluid restriction Restrictions Weight Bearing Restrictions Per Provider Order: No  Therapy/Group: Individual Therapy  [employer] Ambrose Finland, [place], DPT  11/19/2023, 7:58 AM

## 2023-11-19 NOTE — Progress Notes (Signed)
Patient ID: Bobby Sampson, male   DOB: 18-Apr-1961, 62 y.o.   MRN: [id number] Met with the patient to review current situation, rehab process, team conference and plan of care. Patient reports left ear feels like a "piece of meat" and leg " I can move it but it feels like lead". "Was doing well on tract for transplant Stress test on Monday, felt good the night before and when I woke up; I knew something was wrong".  Reviewed secondary risk management including DM (A1C 6.5) but sugars elevated on CIR; reviewed diet modification, increased protein and decreased carb intake on renal diet with 1200 cc FR.  Also reviewed OSA; CPAP trial for sleep study. ESRD, HF, HTN, HLD on lipitor. Reported taking Semglee and short acting insulin PTA and felt tighter control of blood sugar PTA; hypoglycemia noted more often; eats the most at breakfast (90% carbs) and little intake during the day with regular dinner meal. Reviewed constipation management; laxative offered. Continue to follow along to address educational needs to facilitate preparation for discharge. Pamelia Hoit

## 2023-11-19 NOTE — Progress Notes (Signed)
Inpatient Rehabilitation Center Individual Statement of Services  Patient Name:  Bobby Sampson  Date:  11/19/2023  Welcome to the Inpatient Rehabilitation Center.  Our goal is to provide you with an individualized program based on your diagnosis and situation, designed to meet your specific needs.  With this comprehensive rehabilitation program, you will be expected to participate in at least 3 hours of rehabilitation therapies Monday-Friday, with modified therapy programming on the weekends.  Your rehabilitation program will include the following services:  Physical Therapy (PT), Occupational Therapy (OT), 24 hour per day rehabilitation nursing, Therapeutic Recreaction (TR), Neuropsychology, Care Coordinator, Rehabilitation Medicine, Nutrition Services, and Pharmacy Services  Weekly team conferences will be held on Wednesday to discuss your progress.  Your Inpatient Rehabilitation Care Coordinator will talk with you frequently to get your input and to update you on team discussions.  Team conferences with you and your family in attendance may also be held.  Expected length of stay: 10-12 days  Overall anticipated outcome: supervision level  Depending on your progress and recovery, your program may change. Your Inpatient Rehabilitation Care Coordinator will coordinate services and will keep you informed of any changes. Your Inpatient Rehabilitation Care Coordinator's name and contact numbers are listed  below.  The following services may also be recommended but are not provided by the Inpatient Rehabilitation Center:   Home Health Rehabiltiation Services Outpatient Rehabilitation Services    Arrangements will be made to provide these services after discharge if needed.  Arrangements include referral to agencies that provide these services.  Your insurance has been verified to be:  UHC-Medicare & Medicaid Your primary doctor is:  Dr Roland Earl  Pertinent information will be shared with your  doctor and your insurance company.  Inpatient Rehabilitation Care Coordinator:  Dossie Der, [place] [phone] or [phone]  Information discussed with and copy given to patient by: Lucy Chris, 11/19/2023, 10:12 AM

## 2023-11-19 NOTE — Progress Notes (Signed)
Occupational Therapy Session Note  Patient Details  Name: Bobby Sampson MRN: [id number] Date of Birth: 09-10-1961  Today's Date: 11/19/2023 OT Individual Time: 1345-1455 OT Individual Time Calculation (min): 70 min    Short Term Goals: Week 1:  OT Short Term Goal 1 (Week 1): STGs=LTGs due to patient's estimated length of stay.  Skilled Therapeutic Interventions/Progress Updates:    Patient received seated on toilet requesting increased time to have B as he has not gone since Saturday.  Patient allowed time, and was successful and able to complete hygiene/ clothing management.  Ambulated to shower seat with RW and contact guard assist.  Patient moves carefully, and is mindful of LLE impaired sensation.  Patient able to shower with intermittent min assistance.  Patient reports having a tub bench at home.  Able to dress self with min assist for left shoe and increased time.   Patient expressing frustration at recent stroke - "I was doing everything right.  My BP was good, my A1C was 6.5..."   Worked on functional mobility - sit to stand,controlled descent for stand to sit, and walking.  Patient needing cueing and physical assistance to manage walker to allow normal stepping cadence (versus step to pattern.)  Patient with poorly controlled placement of L foot on ground.  He shared that he walked better with ankle weight on this morning - felt more confident (proprioceptive input.)   Patient ended session in recliner with safety belt in place and engagaed and call bell/ personal items in reach.    Therapy Documentation Precautions:  Precautions Precautions: Fall Precaution Comments: L hemi; residual R-sided hemi, visual deficits at baseline; HD MWF; fluid restriction Restrictions Weight Bearing Restrictions Per Provider Order: No   Pain: Denies pain - reports numbness/ pins and needles left hand  Therapy/Group: Individual Therapy  Collier Salina 11/19/2023, 3:14 PM

## 2023-11-19 NOTE — Progress Notes (Signed)
[facility] Progress Note   Subjective:   Seen in room - s/p PT session this morning. Slept well after HD last night, feels good today. No CP/dyspnea.  Objective Vitals:   11/18/23 1843 11/18/23 2040 11/19/23 0500 11/19/23 0607  BP: (!) 162/90 97/62  122/78  Pulse: 82 91  78  Resp: 15 16  18   Temp: 97.7 F (36.5 C) 98.7 F (37.1 C)  97.6 F (36.4 C)  TempSrc:  Oral    SpO2: 100% 100%  100%  Weight: 104.3 kg  104.3 kg   Height:       Physical Exam General: Well appearing man, NAD. Room air Heart: RRR; no murmur Lungs: CTA anteriorly Abdomen: soft Extremities: no LE edema Dialysis Access: RUE AVF +t/b  Additional Objective Labs: Basic Metabolic Panel: Recent Labs  Lab 11/15/23 0953 11/15/23 0958 11/16/23 0613 11/18/23 0515  NA 135 135 132* 134*  K 4.2 4.1 3.8 3.9  CL 94* 94* 91* 95*  CO2 27  --  27 25  GLUCOSE 155* 148* 185* 166*  BUN 40* 37* 54* 50*  CREATININE 8.38* 8.60* 10.01* 10.21*  CALCIUM 8.6*  --  8.9 8.9  PHOS  --   --   --  6.6*   Liver Function Tests: Recent Labs  Lab 11/15/23 0953 11/18/23 0515  AST 16  --   ALT 15  --   ALKPHOS 101  --   BILITOT 0.6  --   PROT 6.0*  --   ALBUMIN 3.0* 3.3*   CBC: Recent Labs  Lab 11/15/23 0953 11/15/23 0958 11/16/23 0613 11/18/23 0515  WBC 7.3  --  6.8 7.2  NEUTROABS 4.7  --   --   --   HGB 11.1* 11.9* 12.0* 12.2*  HCT 37.4* 35.0* 36.8* 37.8*  MCV 91.2  --  82.7 83.1  PLT 224  --  271 276   Medications:   aspirin  81 mg Oral Daily   atorvastatin  80 mg Oral QHS   buPROPion  150 mg Oral Daily   calcitRIOL  0.5 mcg Oral Q M,W,F-1800   calcium acetate  1,334 mg Oral BID WC   Chlorhexidine Gluconate Cloth  6 each Topical Q0600   clopidogrel  75 mg Oral Daily   dorzolamide-timolol  1 drop Both Eyes BID   FLUoxetine  20 mg Oral QHS   heparin  5,000 Units Subcutaneous Q8H   insulin aspart  0-6 Units Subcutaneous TID WC   insulin glargine-yfgn  13 Units Subcutaneous BID    latanoprost  1 drop Left Eye QHS   metoprolol succinate  25 mg Oral Daily   pantoprazole  40 mg Oral QHS    Dialysis Orders: MWF - [facility] [place] ([address]) - [facility] team 4hr, 450/800, EDW 94.8kg, 2K/2.5Ca, AVF, no heparin - Calcitriol 0.32mcg q HD - Mircera q 4 week - Venofer 100mg  x 10 (in middle of this) - 2.5L UFG max per cardiologist   Assessment/Plan: 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 Was out of window for thrombolytics per notes. 2. ESRD: Usual MWF schedule - next HD tomorrow. 3. HTN/volume:  BP better, no edema on exam. UF as tolerated - 2.5L UFG max per notes. 4. Anemia: Hgb 12.2, no ESA for now. 5. Secondary hyperparathyroidism:  Ca ok, Phos high - have increased Phoslo dose. 6. T2DM 7. Hx prior CVA: On statin + plavix 8. HFpEF   Ozzie Hoyle, PA-C 11/19/2023, 12:45 PM  [facility]

## 2023-11-19 NOTE — Progress Notes (Signed)
PROGRESS NOTE   Subjective/Complaints: No acute events overnight noted.  Had dialysis yesterday.  Reports he he has had the best night of sleep last night that he has had an a long time.  He does feel groggy this morning.  Reports he feels like he was tolerating CPAP well last night.  ROS: Patient denies fever, rash, sore throat, blurred vision, dizziness, nausea, vomiting, diarrhea, cough, shortness of breath or chest pain, joint or back/neck pain, headache, or mood change.  + insomnia    Objective:   No results found. Recent Labs    11/18/23 0515  WBC 7.2  HGB 12.2*  HCT 37.8*  PLT 276   Recent Labs    11/18/23 0515  NA 134*  K 3.9  CL 95*  CO2 25  GLUCOSE 166*  BUN 50*  CREATININE 10.21*  CALCIUM 8.9    Intake/Output Summary (Last 24 hours) at 11/19/2023 0843 Last data filed at 11/19/2023 0830 Gross per 24 hour  Intake 120 ml  Output 2400 ml  Net -2280 ml        Physical Exam: Vital Signs Blood pressure 122/78, pulse 78, temperature 97.6 F (36.4 C), resp. rate 18, height 5\' 9"  (1.753 m), weight 104.3 kg, SpO2 100%.   General: Alert and oriented x 3, No apparent distress HEENT: Head is normocephalic, atraumatic, mucous membranes a little dry Heart: Reg rate and rhythm. No murmor Chest: CTA bilaterally without wheezes, rales, or rhonchi; no distress Abdomen: Soft, non-tender, non-distended, bowel sounds positive. Extremities: AVF right upper extremity Psych: Pt's affect is appropriate. Pt is cooperative Skin: Warm and dry Neuro: Alert and awake. Normal insight and awareness. Intact Memory. Normal language and speech. Cranial nerve exam unremarkable mild left facial sensory loss. Impaired vision at baseline.    MMT: RUE and RLE 5/5 prox to distal. LUE 4+/5 prox to distal  Impaired FTN/FMC.   LLE 4+/5.   Sensation 1/2 left arm and leg to light touch and proprioception.  No abnl resting tone.   Pt with good sitting balance.   - unchagned 12/19 Musculoskeletal:  Missing digits 1 and 2 on the right     Assessment/Plan: 1. Functional deficits which require 3+ hours per day of interdisciplinary therapy in a comprehensive inpatient rehab setting. Physiatrist is providing close team supervision and 24 hour management of active medical problems listed below. Physiatrist and rehab team continue to assess barriers to discharge/monitor patient progress toward functional and medical goals  Care Tool:  Bathing    Body parts bathed by patient: Right arm, Left arm, Chest, Abdomen, Front perineal area, Buttocks, Right upper leg, Left upper leg, Right lower leg, Left lower leg, Face         Bathing assist Assist Level: Contact Guard/Touching assist     Upper Body Dressing/Undressing Upper body dressing   What is the patient wearing?: Pull over shirt    Upper body assist Assist Level: Set up assist    Lower Body Dressing/Undressing Lower body dressing      What is the patient wearing?: Pants, Underwear/pull up     Lower body assist Assist for lower body dressing: Contact Guard/Touching assist  Toileting Toileting Toileting Activity did not occur Press photographer and hygiene only): N/A (no void or bm)  Toileting assist       Transfers Chair/bed transfer  Transfers assist     Chair/bed transfer assist level: Moderate Assistance - Patient 50 - 74% (no AD mod A with RW min A)     Locomotion Ambulation   Ambulation assist      Assist level: Minimal Assistance - Patient > 75% Assistive device: Walker-rolling Max distance: 88   Walk 10 feet activity   Assist     Assist level: Minimal Assistance - Patient > 75% Assistive device: Walker-rolling   Walk 50 feet activity   Assist    Assist level: Minimal Assistance - Patient > 75% Assistive device: Walker-rolling    Walk 150 feet activity   Assist Walk 150 feet activity did not occur:  Safety/medical concerns (fatigue)         Walk 10 feet on uneven surface  activity   Assist Walk 10 feet on uneven surfaces activity did not occur: Safety/medical concerns (fatigue)         Wheelchair     Assist Is the patient using a wheelchair?: Yes Type of Wheelchair: Manual    Wheelchair assist level: Supervision/Verbal cueing Max wheelchair distance: 150    Wheelchair 50 feet with 2 turns activity    Assist        Assist Level: Supervision/Verbal cueing   Wheelchair 150 feet activity     Assist      Assist Level: Supervision/Verbal cueing   Blood pressure 122/78, pulse 78, temperature 97.6 F (36.4 C), resp. rate 18, height 5\' 9"  (1.753 m), weight 104.3 kg, SpO2 100%.  Medical Problem List and Plan: 1. 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  Antithrombotics: -DVT/anticoagulation:  Pharmaceutical: Heparin             -antiplatelet therapy: Aspirin and Plavix for three weeks followed by Plavix alone   3. Pain Management: Tylenol as needed   4. Mood/Behavior/Sleep: LCSW to evaluate and provide emotional support             -continue Wellbutrin XL and Prozac             -antipsychotic agents: n/a   5. Neuropsych/cognition: This patient is capable of making decisions on his own behalf.   6. Skin/Wound Care: Routine skin care checks   7. Fluids/Electrolytes/Nutrition: Strict Is and Os.  Laboratory follow-up per nephrology             -renal diet; 1200 mL fluid restriction   8: Hypertension: monitor TID and prn             -continue Toprol-XL 25 mg daily  -12/19 controlled continue to monitor    11/19/2023    6:07 AM 11/19/2023    5:00 AM 11/18/2023    8:40 PM  Vitals with BMI  Weight   229 lbs 15 oz   BMI  33.94   Systolic 122  97  Diastolic 78  62  Pulse 78  91      9: Hyperlipidemia: continue statin   10:  Glaucoma: continue Cosopt and Xalatan   11: DM: CBGs QID, A1c = 6.5% (home regimen: insulin glargine 20 units twice daily, NovoLog insulin 7 units 3 times daily with meals)              -continue Novolog 5 units with meals             -continue Semglee 15 units BID  -12/18 dc NovoLog  with meals due to hypoglycemia last night, restart correctional, Decrease semglee to 13u BID  -12/19 CBGs stable, continue current regimen for now   CBG (last 3)  Recent Labs    11/18/23 2004 11/18/23 2142 11/19/23 0618  GLUCAP 152* 206* 185*     12: ESRD on HD on M/W/F via RUE AVF             - on Phoslo BID and Rocaltrol M/W/F             -HD at the end of the day to allow participation in therapies  -Reviewed nephrology note, had HD yesterday  13: OSA: CPAP  -12/19 reports good results with CPAP last night  14: CHF: echo>>55-60%, grade 1 DD -volume status per nephrology/HD -No signs to indicate fluid overload Filed Weights   11/18/23 1445 11/18/23 1843 11/19/23 0500  Weight: 107.6 kg 104.3 kg 104.3 kg      15: Obesity: BMI 34.61      LOS: 2 days A FACE TO FACE EVALUATION WAS PERFORMED  Fanny Dance 11/19/2023, 8:43 AM

## 2023-11-19 NOTE — Progress Notes (Signed)
Physical Therapy Session Note  Patient Details  Name: Bobby Sampson MRN: [id number] Date of Birth: 1961-01-07  Today's Date: 11/19/2023 PT Individual Time: 0745-0855 PT Individual Time Calculation (min): 70 min   Short Term Goals: Week 1:  PT Short Term Goal 1 (Week 1): pt will ambulate 150 feet with LRAD and CGA PT Short Term Goal 2 (Week 1): pt will perform sit to stand with LRAD and CGA PT Short Term Goal 3 (Week 1): pt will perform bed to chair transfer with LRAD and CGA  Skilled Therapeutic Interventions/Progress Updates:   Received pt sitting EOB. Pt reported just waking up and feeling "groggy and disoriented", reporting last night was the first night he slept with a CPAP. Pt required increased time to waken but pleased with deep sleep he got last night. Pt agreeable to PT treatment and denied any pain during session. Session with emphasis on functional mobility/transfers, generalized strengthening and endurance, and gait training.   Pt requesting to eat breakfast - encouraged getting OOB to eat and pt requested to get into recliner. Pt transferred bed<>recliner via stand<>pivot with RW and min A. Ate breakfast and began to wake up and pt reported feeling better. Respiratory arrived to discuss pt's first night using CPAP. Pt normally sleeps 4 hours and slept for 10 hours last night. Pt perseverating on "not being used to getting that much sleep" but aware that this is good thing and will take some time for his body to get acclimated.   Pt transferred into WC via stand<>pivot with RW and min A and requested to brush teeth. Sat in WC at sink and brushed teeth and washed face with set up assist. Of note, pt hyperverbal and moving very slow this morning most likely due to feeling "out of it". Pt transported to/from room in [facility] dependently for time management purposes. Stood with RW and min A x 2 trials (noted posterior lean initially in standing) and ambulated 22ft x 1 and 41ft x 1 with RW and min  A with WC follow with 1 instance of L lateral LOB requiring mod A to correct. Pt limited on trial 1 by increased SOB and LLE fatigue on trial 2. Returned to room and transferred into recliner with RW and min A. Concluded session with pt sitting in recliner, needs within reach, and seatbelt alarm on.   Therapy Documentation Precautions:  Precautions Precautions: Fall Precaution Comments: L hemi; residual R-sided hemi, visual deficits at baseline; HD MWF; fluid restriction Restrictions Weight Bearing Restrictions Per Provider Order: No  Therapy/Group: Individual Therapy Marlana Salvage Zaunegger Blima Rich PT, DPT 11/19/2023, 6:44 AM

## 2023-11-20 DIAGNOSIS — N186 End stage renal disease: Secondary | ICD-10-CM | POA: Diagnosis not present

## 2023-11-20 DIAGNOSIS — I10 Essential (primary) hypertension: Secondary | ICD-10-CM | POA: Diagnosis not present

## 2023-11-20 DIAGNOSIS — I5032 Chronic diastolic (congestive) heart failure: Secondary | ICD-10-CM | POA: Diagnosis not present

## 2023-11-20 DIAGNOSIS — I6322 Cerebral infarction due to unspecified occlusion or stenosis of basilar arteries: Secondary | ICD-10-CM | POA: Diagnosis not present

## 2023-11-20 DIAGNOSIS — I951 Orthostatic hypotension: Secondary | ICD-10-CM

## 2023-11-20 LAB — GLUCOSE, CAPILLARY
Glucose-Capillary: 142 mg/dL — ABNORMAL HIGH (ref 70–99)
Glucose-Capillary: 168 mg/dL — ABNORMAL HIGH (ref 70–99)
Glucose-Capillary: 169 mg/dL — ABNORMAL HIGH (ref 70–99)

## 2023-11-20 NOTE — Progress Notes (Signed)
[facility] Progress Note   Subjective:   Seen in room - working with OT. Not feeling great today, tired and orthostatic this AM with dizziness. Denies diarrhea, in fact c/o constipation. Metoprolol held this AM.   Objective Vitals:   11/19/23 0607 11/19/23 1616 11/19/23 1942 11/20/23 0605  BP: 122/78 100/68 120/67 118/79  Pulse: 78 80 78 81  Resp: 18 19 18 18   Temp: 97.6 F (36.4 C) 97.7 F (36.5 C) 97.8 F (36.6 C) 97.8 F (36.6 C)  TempSrc:   Oral Oral  SpO2: 100% 92% 100% 99%  Weight:      Height:       Physical Exam General: Well appearing man, NAD. Room air Heart: RRR; no murmur Lungs: CTA anteriorly Abdomen: soft Extremities: no LE edema Dialysis Access: RUE AVF +t/b  Additional Objective Labs: Basic Metabolic Panel: Recent Labs  Lab 11/15/23 0953 11/15/23 0958 11/16/23 0613 11/18/23 0515  NA 135 135 132* 134*  K 4.2 4.1 3.8 3.9  CL 94* 94* 91* 95*  CO2 27  --  27 25  GLUCOSE 155* 148* 185* 166*  BUN 40* 37* 54* 50*  CREATININE 8.38* 8.60* 10.01* 10.21*  CALCIUM 8.6*  --  8.9 8.9  PHOS  --   --   --  6.6*   Liver Function Tests: Recent Labs  Lab 11/15/23 0953 11/18/23 0515  AST 16  --   ALT 15  --   ALKPHOS 101  --   BILITOT 0.6  --   PROT 6.0*  --   ALBUMIN 3.0* 3.3*   CBC: Recent Labs  Lab 11/15/23 0953 11/15/23 0958 11/16/23 0613 11/18/23 0515  WBC 7.3  --  6.8 7.2  NEUTROABS 4.7  --   --   --   HGB 11.1* 11.9* 12.0* 12.2*  HCT 37.4* 35.0* 36.8* 37.8*  MCV 91.2  --  82.7 83.1  PLT 224  --  271 276   Medications:   aspirin  81 mg Oral Daily   atorvastatin  80 mg Oral QHS   buPROPion  150 mg Oral Daily   calcitRIOL  0.5 mcg Oral Q M,W,F-1800   calcium acetate  1,334 mg Oral BID WC   Chlorhexidine Gluconate Cloth  6 each Topical Q0600   clopidogrel  75 mg Oral Daily   dorzolamide-timolol  1 drop Both Eyes BID   FLUoxetine  20 mg Oral QHS   heparin  5,000 Units Subcutaneous Q8H   insulin aspart  0-6 Units  Subcutaneous TID WC   insulin glargine-yfgn  13 Units Subcutaneous BID   latanoprost  1 drop Left Eye QHS   metoprolol succinate  25 mg Oral Daily   pantoprazole  40 mg Oral QHS    Dialysis Orders: MWF - [facility] ([address]) - [facility] team 4hr, 450/800, EDW 94.8kg, 2K/2.5Ca, AVF, no heparin - Calcitriol 0.10mcg q HD - Mircera q 4 week - Venofer 100mg  x 10 (in middle of this) - 2.5L UFG max per cardiologist   Assessment/Plan: 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 Was out of window for thrombolytics per notes. 2. ESRD: Usual MWF schedule - for HD later today. 3. HTN/volume:  BP better, no edema on exam. 2.5L UFG max per notes. Orthostatic this AM, dizzy with standing. Will adjust HD order - no UF planned today. 4. Anemia: Hgb 12.2, no ESA for now. 5. Secondary hyperparathyroidism:  Ca ok, Phos high - have increased Phoslo  dose. 6. T2DM 7. Hx prior CVA: On statin + plavix 8. HFpEF   Ozzie Hoyle, Cordelia Poche 11/20/2023, 10:52 AM  [employer]

## 2023-11-20 NOTE — IPOC Note (Signed)
Overall Plan of Care [facility]) Patient Details Name: Bobby Sampson MRN: [id number] DOB: 1961-06-28  Admitting Diagnosis: CVA (cerebral vascular accident) [facility])  Hospital Problems: Principal Problem:   CVA (cerebral vascular accident) [facility])     Functional Problem List: Nursing Bowel, Safety, Endurance, Medication Management  PT Balance, Behavior, Edema, Endurance, Motor, Perception, Safety, Skin Integrity, Sensory  OT Balance, Endurance, Motor, Perception, Safety, Skin Integrity, Vision  SLP    TR         Basic ADL's: OT Bathing, Dressing, Toileting     Advanced  ADL's: OT Simple Meal Preparation     Transfers: PT Bed Mobility, Bed to Chair, Car, Occupational psychologist, Research scientist (life sciences): PT Ambulation, Psychologist, prison and probation services, Stairs     Additional Impairments: OT Fuctional Use of Upper Extremity  SLP        TR      Anticipated Outcomes Item Anticipated Outcome  Self Feeding    Swallowing      Basic self-care  Marketing executive Transfers Supervision  Bowel/Bladder  manage bowel w mod I assist  Transfers  supervision  Locomotion  supervision  Communication     Cognition     Pain  n/a  Safety/Judgment  manage w cues   Therapy Plan: PT Intensity: Minimum of 1-2 x/day ,45 to 90 minutes PT Frequency: 5 out of 7 days PT Duration Estimated Length of Stay: 12-14 days OT Intensity: Minimum of 1-2 x/day, 45 to 90 minutes OT Frequency: 5 out of 7 days OT Duration/Estimated Length of Stay: 10-12 days     Team Interventions: Nursing Interventions Bowel Management, Medication Management, Discharge Planning, Disease Management/Prevention, Patient/Family Education  PT interventions Ambulation/gait training, Discharge planning, Functional mobility training, Psychosocial support, Therapeutic Activities, Visual/perceptual remediation/compensation, Warden/ranger, Disease management/prevention, Neuromuscular re-education,  Skin care/wound management, Therapeutic Exercise, Wheelchair propulsion/positioning, Cognitive remediation/compensation, DME/adaptive equipment instruction, Pain management, UE/LE Strength taining/ROM, Splinting/orthotics, Community reintegration, Development worker, international aid stimulation, Equities trader education, Museum/gallery curator, UE/LE Coordination activities  OT Interventions Warden/ranger, Firefighter, Discharge planning, Disease mangement/prevention, Fish farm manager, Functional electrical stimulation, Functional mobility training, Neuromuscular re-education, Pain management, Patient/family education, Psychosocial support, Self Care/advanced ADL retraining, Skin care/wound managment, Splinting/orthotics, Therapeutic Activities, UE/LE Coordination activities, UE/LE Strength taining/ROM, Wheelchair propulsion/positioning, Therapeutic Exercise, Visual/perceptual remediation/compensation  SLP Interventions    TR Interventions    SW/CM Interventions Discharge Planning, Psychosocial Support, Patient/Family Education   Barriers to Discharge MD  Medical stability and Hemodialysis  Nursing Decreased caregiver support, Home environment access/layout, Hemodialysis 1 level ramped entry BALDs per self x shoes; independent, working out at [employer], walking, working towards getting on kidney transplant list, no DME at baseline, but does have some assist with ADLs from spouse or PCA after HD sessions due to fatigue.  Doesn't drive  PT Hemodialysis, Decreased caregiver support    OT Hemodialysis    SLP      SW Decreased caregiver support, Insurance for SNF coverage, Hemodialysis     Team Discharge Planning: Destination: PT-Home ,OT- Home , SLP-  Projected Follow-up: PT-Home health PT, OT-  Home health OT, SLP-  Projected Equipment Needs: PT-To be determined, OT- To be determined, SLP-  Equipment Details: PT-pt has RW, WC, cane, OT-  Patient/family involved in discharge  planning: PT- Patient,  OT-Patient, SLP-   MD ELOS: 12-14 Medical Rehab Prognosis:  Excellent Assessment: The patient has been admitted for CIR therapies with the diagnosis of right dorsal pontine infarct. The team will be addressing functional  mobility, strength, stamina, balance, safety, adaptive techniques and equipment, self-care, bowel and bladder mgt, patient and caregiver education. Goals have been set at sup. Anticipated discharge destination is Home.        See Team Conference Notes for weekly updates to the plan of care

## 2023-11-20 NOTE — Plan of Care (Signed)
  Problem: RH BOWEL ELIMINATION Goal: RH STG MANAGE BOWEL WITH ASSISTANCE Description: STG Manage Bowel with toileting Assistance. Outcome: Progressing   Problem: RH SAFETY Goal: RH STG ADHERE TO SAFETY PRECAUTIONS W/ASSISTANCE/DEVICE Description: STG Adhere to Safety Precautions With cues Assistance/Device. Outcome: Progressing   Problem: RH KNOWLEDGE DEFICIT Goal: RH STG INCREASE KNOWLEDGE OF DIABETES Description: Patient and spouse will be able to manage DM with educational resources for medications and dietary modifications independently Outcome: Progressing Goal: RH STG INCREASE KNOWLEDGE OF HYPERTENSION Description: Patient and spouse will be able to manage HTN with educational resources for medications and dietary modifications independently Outcome: Progressing Goal: RH STG INCREASE KNOWLEGDE OF HYPERLIPIDEMIA Description: Patient and spouse will be able to manage HLD with educational resources for medications and dietary modifications independently Outcome: Progressing   Problem: Nutritional: Goal: Maintenance of adequate nutrition will improve Outcome: Progressing   Problem: Skin Integrity: Goal: Risk for impaired skin integrity will decrease Outcome: Progressing

## 2023-11-20 NOTE — Progress Notes (Signed)
Physical Therapy Session Note  Patient Details  Name: Bobby Sampson MRN: [id number] Date of Birth: [birth year]  Today's Date: 11/20/2023 PT Individual Time: 0806-0908 PT Individual Time Calculation (min): 62 min   Short Term Goals: Week 1:  PT Short Term Goal 1 (Week 1): pt will ambulate 150 feet with LRAD and CGA PT Short Term Goal 2 (Week 1): pt will perform sit to stand with LRAD and CGA PT Short Term Goal 3 (Week 1): pt will perform bed to chair transfer with LRAD and CGA  Skilled Therapeutic Interventions/Progress Updates:  Patient seated upright in w/c on entrance to room. Patient alert and agreeable to PT session.   Patient with no pain complaint at start of session. Relates he has already dressed and just sat at sink to brush teeth and wash face.   Pt also relates recent and past medical history including previous strokes affecting R side as well as missing first two toes on L foot.   Therapeutic Activity: Transfers: Able to rise to stand with CGA/ minA. Upon reaching upright stance and prior to ambulation, pt relates increased dizziness/ nausea and unable to maintain stance for fear of syncope. BP taken after couple of minutes in seated position. Pt agreeable to stand again for additional BP in standing to check for orthostasis. In stance, immediate BP demonstrates drop in systolic BP of 44 points with increase again of dizziness and nausea. (Orthostatic vitals listed in grid below.) Returned to sitting and final BP taken of 129/ 73 (88), pulse 77 after sitting for . Pt willing to stand again in order to adjust positioning for continued "training" of body to adjust to change in position. Rise to stand with CGA after rest. Pt again feels rise in same symptoms but reading is inaccurate at 139/ 119 (127), pulse 193. RN and MD notifed re: orthostasis. MD does relates that pt is not currently provided with BP medication on HD days. Pt relates that at home he took his BP medication  every day regardless of HD.   BP not taken in supine as pt relates he has no symptoms from supine to sitting. Only sitting to standing.   Orthostatic VS for the past 24 hrs:  BP- Sitting Pulse- Sitting BP- Standing at 0 minutes Pulse- Standing at 0 minutes  11/20/23 0900 127/79 (95) 78 (!) 83/60  (68) 79   Could not stand long enough for standing pressure after few minutes d/t increase in dizziness and potential syncope. Stand pivot using no AD and hand holds to recliner armrests with CGA.   Patient seated upright in recliner at end of session with brakes locked, belt alarm set, and all needs within reach. Left in care of NT and MD.    Therapy Documentation Precautions:  Precautions Precautions: Fall Precaution Comments: L hemi; residual R-sided hemi, visual deficits at baseline; HD MWF; fluid restriction Restrictions Weight Bearing Restrictions Per Provider Order: No General:   Vital Signs: Therapy Vitals Patient Position (if appropriate): Orthostatic Vitals Pain: Pain Assessment Pain Scale: 0-10 Pain Score: 0-No pain related this session but does have lightheadedness and nausea with standing. Addressed with repositioning. RN and MD notified.    Therapy/Group: Individual Therapy  Loel Dubonnet PT, DPT, CSRS 11/20/2023, 10:24 AM

## 2023-11-20 NOTE — Progress Notes (Signed)
PROGRESS NOTE   Subjective/Complaints: Patient noted to have orthostatic hypotension.  Dizzy when standing with significant drop in blood pressure to 79/55.  Patient reports his sensory deficits are gradually improving.  ROS: Patient denies fever, rash, sore throat, blurred vision, dizziness, nausea, vomiting, diarrhea, cough, shortness of breath or chest pain, joint or back/neck pain, headache, or mood change.  + insomnia Numbness-improving gradually   Objective:   No results found. Recent Labs    11/18/23 0515  WBC 7.2  HGB 12.2*  HCT 37.8*  PLT 276   Recent Labs    11/18/23 0515  NA 134*  K 3.9  CL 95*  CO2 25  GLUCOSE 166*  BUN 50*  CREATININE 10.21*  CALCIUM 8.9    Intake/Output Summary (Last 24 hours) at 11/20/2023 1411 Last data filed at 11/20/2023 1303 Gross per 24 hour  Intake 360 ml  Output --  Net 360 ml        Physical Exam: Vital Signs Blood pressure 118/79, pulse 81, temperature 97.8 F (36.6 C), temperature source Oral, resp. rate 18, height 5\' 9"  (1.753 m), weight 104.3 kg, SpO2 99%.   General: Alert and oriented x 3, No apparent distress HEENT: Head is normocephalic, atraumatic, mucous membranes dry Heart: Reg rate and rhythm. No murmor Chest: CTA bilaterally without wheezes, rales, or rhonchi; no distress Abdomen: Soft, non-tender, non-distended, bowel sounds positive. Extremities: AVF right upper extremity, no lower extremity edema Psych: Pt's affect is appropriate. Pt is cooperative Skin: Warm and dry Neuro: Alert and awake. Normal insight and awareness. Intact Memory. Normal language and speech. Cranial nerve exam unremarkable mild left facial sensory loss. Impaired vision at baseline.    MMT: RUE and RLE 5/5 prox to distal. LUE 4+/5 prox to distal  Impaired FTN/FMC.   LLE 4+/5.   Sensation 1/2 left arm and leg to light touch and proprioception.  No abnl resting tone.  Pt  with good sitting balance.   - unchagned 12/20 Musculoskeletal:  Missing digits 1 and 2 on the right     Assessment/Plan: 1. Functional deficits which require 3+ hours per day of interdisciplinary therapy in a comprehensive inpatient rehab setting. Physiatrist is providing close team supervision and 24 hour management of active medical problems listed below. Physiatrist and rehab team continue to assess barriers to discharge/monitor patient progress toward functional and medical goals  Care Tool:  Bathing    Body parts bathed by patient: Right arm, Left arm, Chest, Abdomen, Front perineal area, Buttocks, Right upper leg, Left upper leg, Right lower leg, Left lower leg, Face         Bathing assist Assist Level: Contact Guard/Touching assist     Upper Body Dressing/Undressing Upper body dressing   What is the patient wearing?: Pull over shirt    Upper body assist Assist Level: Set up assist    Lower Body Dressing/Undressing Lower body dressing      What is the patient wearing?: Pants, Underwear/pull up     Lower body assist Assist for lower body dressing: Contact Guard/Touching assist     Toileting Toileting Toileting Activity did not occur (Clothing management and hygiene only): N/A (no void or bm)  Toileting assist Assist for toileting: Minimal Assistance - Patient > 75%     Transfers Chair/bed transfer  Transfers assist     Chair/bed transfer assist level: Moderate Assistance - Patient 50 - 74%     Locomotion Ambulation   Ambulation assist      Assist level: Moderate Assistance - Patient 50 - 74% Assistive device: Walker-rolling Max distance: 147   Walk 10 feet activity   Assist     Assist level: Minimal Assistance - Patient > 75% Assistive device: Walker-rolling   Walk 50 feet activity   Assist    Assist level: Minimal Assistance - Patient > 75% Assistive device: Walker-rolling    Walk 150 feet activity   Assist Walk 150 feet  activity did not occur: Safety/medical concerns (fatigue)         Walk 10 feet on uneven surface  activity   Assist Walk 10 feet on uneven surfaces activity did not occur: Safety/medical concerns (fatigue)         Wheelchair     Assist Is the patient using a wheelchair?: Yes Type of Wheelchair: Manual    Wheelchair assist level: Supervision/Verbal cueing Max wheelchair distance: 150    Wheelchair 50 feet with 2 turns activity    Assist        Assist Level: Supervision/Verbal cueing   Wheelchair 150 feet activity     Assist      Assist Level: Supervision/Verbal cueing   Blood pressure 118/79, pulse 81, temperature 97.8 F (36.6 C), temperature source Oral, resp. rate 18, height 5\' 9"  (1.753 m), weight 104.3 kg, SpO2 99%.  Medical Problem List and Plan: 1. 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  Antithrombotics: -DVT/anticoagulation:  Pharmaceutical: Heparin             -antiplatelet therapy: Aspirin and Plavix for three weeks followed by Plavix alone   3. Pain Management: Tylenol as needed   4. Mood/Behavior/Sleep: LCSW to evaluate and provide emotional support             -continue Wellbutrin XL and Prozac             -antipsychotic agents: n/a   5. Neuropsych/cognition: This patient is capable of making decisions on his own behalf.   6. Skin/Wound Care: Routine skin care checks   7. Fluids/Electrolytes/Nutrition: Strict Is and Os.  Laboratory follow-up per nephrology             -renal diet; 1200 mL fluid restriction   8: Hypertension: monitor TID and prn             -continue Toprol-XL 25 mg daily  -12/20 baseline blood pressures stable, continue current regimen for now    11/20/2023     6:05 AM 11/19/2023    7:42 PM 11/19/2023    4:16 PM  Vitals with BMI  Systolic 118 120 829  Diastolic 79 67 68  Pulse 81 78 80      9: Hyperlipidemia: continue statin   10: Glaucoma: continue Cosopt and Xalatan   11: DM: CBGs QID, A1c = 6.5% (home regimen: insulin glargine 20 units twice daily, NovoLog  insulin 7 units 3 times daily with meals)              -continue Novolog 5 units with meals             -continue Semglee 15 units BID  -12/18 dc NovoLog  with meals due to hypoglycemia last night, restart correctional, Decrease semglee to 13u BID  -12/20 CBGs fair control, continue current regimen for now   CBG (last 3)  Recent Labs    11/19/23 2051 11/20/23 0600 11/20/23 1224  GLUCAP 173* 142* 168*     12: ESRD on HD on M/W/F via RUE AVF             - on Phoslo BID and Rocaltrol M/W/F             -HD at the end of the day to allow participation in therapies  -Reviewed nephrology note, HD today  13: OSA: CPAP  -12/19 reports good results with CPAP last night -12/20 patient reports he did not qualify for CPAP during the study he was doing  14: CHF: echo>>55-60%, grade 1 DD -volume status per nephrology/HD -No signs to indicate fluid overload -Weights appear little down Filed Weights   11/18/23 1445 11/18/23 1843 11/19/23 0500  Weight: 107.6 kg 104.3 kg 104.3 kg      15: Obesity: BMI 34.61  16.  Orthostatic hypotension   -Compression stockings and abdominal binder  -Discussed with nephrology today, HD ordered to be adjusted today, no UF/not pulling any fluids off today    LOS: 3 days A FACE TO FACE EVALUATION WAS PERFORMED  Fanny Dance 11/20/2023, 2:11 PM

## 2023-11-20 NOTE — Progress Notes (Signed)
Occupational Therapy Session Note  Patient Details  Name: Bobby Sampson MRN: [id number] Date of Birth: [birth year]  Today's Date: 11/20/2023 OT Individual Time: 7829-5621 OT Individual Time Calculation (min): 75 min    Short Term Goals: Week 1:  OT Short Term Goal 1 (Week 1): STGs=LTGs due to patient's estimated length of stay.  Skilled Therapeutic Interventions/Progress Updates:  Pt greeted seated in recliner, pt agreeable to OT intervention but reports OH this AM. Retrieved verbal order from PA for teds to trial during session  Vitals ( with teds donned) :  Seated- 130/74( 90) HR 75 Standing- 79/55 ( 63) HR 79  D/t OH deffered standing tasks and worked on seated FMC in BUEs.   Therapeutic activity: worked on LUE dexterity with pt completing seated task with pt instructed to use weighted clothespin to grasp geometric shapes. 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 Pt completed task with + time and assist needed to stabilize shapes while reaching.   Pt completed additional seated task with pt shown pattern with geometric shapes and pt instructed to duplicate pattern with BUEs, pt completed task mostly with RUE but LUE acting as gross assist. Pt completed task with + time and overall supervision.   Pt additionally completed [facility] task with pt completing various folding patterns with wash cloths to allow for improved bilateral integration tasks. Pt completed task with supervision and increased time and effort.   Assessments: of note pt with bilateral neuropathy 9 Hole Peg Test is used to measure finger dexterity in pts with various neurological diagnoses. - Instructions The pt was instructed to pick up the pegs one at a time, using their dominant hand first and put them into the holes in any order until the holes were all filled. The pt then removed the pegs one at a time and returned them  to the container. Both hands were tested separately.  - Results The pt completed the test in RUE 1 min 41 seconds, LUE- 2 mins and 23 secs. Scores are based on the time taken to complete the activity. The timer started the moment the pt touched the first peg until the moment the last peg hit the container.  - Norms for healthy males ages 50-70+ 58-55 R 18.9 L 19.84 56-60 R 20.90 L 21.64 61-65 R 20.87 L 21.60 66-70 R 21.23 L 22.29 71+ R 25.79 L 25.95   Ended session with pt seated in recliner with all needs within reach.               Therapy Documentation Precautions:  Precautions Precautions: Fall Precaution Comments: L hemi; residual R-sided hemi, visual deficits at baseline; HD MWF; fluid restriction Restrictions Weight Bearing Restrictions Per Provider Order: No  Pain: No pain reported during session    Therapy/Group: Individual Therapy  Barron Schmid 11/20/2023, 12:20 PM

## 2023-11-20 NOTE — Progress Notes (Signed)
Pt transported to dialysis via bed with attendant. Hilton Sinclair BSN RN CMSRN 11/20/2023, 8:57 PM

## 2023-11-21 DIAGNOSIS — Z992 Dependence on renal dialysis: Secondary | ICD-10-CM | POA: Diagnosis not present

## 2023-11-21 DIAGNOSIS — E0843 Diabetes mellitus due to underlying condition with diabetic autonomic (poly)neuropathy: Secondary | ICD-10-CM | POA: Diagnosis not present

## 2023-11-21 DIAGNOSIS — N186 End stage renal disease: Secondary | ICD-10-CM | POA: Diagnosis not present

## 2023-11-21 DIAGNOSIS — I63012 Cerebral infarction due to thrombosis of left vertebral artery: Secondary | ICD-10-CM | POA: Diagnosis not present

## 2023-11-21 LAB — GLUCOSE, CAPILLARY
Glucose-Capillary: 118 mg/dL — ABNORMAL HIGH (ref 70–99)
Glucose-Capillary: 170 mg/dL — ABNORMAL HIGH (ref 70–99)
Glucose-Capillary: 257 mg/dL — ABNORMAL HIGH (ref 70–99)
Glucose-Capillary: 243 mg/dL — ABNORMAL HIGH (ref 70–99)
Glucose-Capillary: 198 mg/dL — ABNORMAL HIGH (ref 70–99)

## 2023-11-21 LAB — HEPATITIS B SURFACE ANTIGEN: Hepatitis B Surface Ag: NONREACTIVE

## 2023-11-21 MED ORDER — CHLORHEXIDINE GLUCONATE CLOTH 2 % EX PADS
6.0000 | MEDICATED_PAD | Freq: Every day | CUTANEOUS | Status: DC
  Administered 2023-11-24: 6 via TOPICAL

## 2023-11-21 NOTE — Progress Notes (Signed)
Pt received back from Dialysis.  No events reported during session.  Glucose 118 upon return.  Pt hesitant to take evening dose of Levimir.  Discussed with [employer] PA on call and will hold evening dose.  Pt in agreement.    11/21/23 0238  Vitals  Temp 97.6 F (36.4 C)  Temp Source Oral  BP 125/82  MAP (mmHg) 93  BP Location Left Arm  BP Method Automatic  Patient Position (if appropriate) Sitting  Pulse Rate 86  Pulse Rate Source Monitor  Resp 19  Level of Consciousness  Level of Consciousness Alert  Oxygen Therapy  SpO2 100 %  O2 Device Room Air  Pain Assessment  Pain Scale 0-10  Pain Score 0  POSS Scale (Pasero Opioid Sedation Scale)  POSS *See Group Information* 1-Acceptable,Awake and alert   Hilton Sinclair BSN RN [facility] 11/21/2023, 2:59 AM

## 2023-11-21 NOTE — Plan of Care (Signed)
  Problem: RH Balance Goal: LTG Patient will maintain dynamic standing with ADLs (OT) Description: LTG:  Patient will maintain dynamic standing balance with assist during activities of daily living (OT)  Flowsheets (Taken 11/21/2023 1518) LTG: Pt will maintain dynamic standing balance during ADLs with: Supervision/Verbal cueing   Problem: Sit to Stand Goal: LTG:  Patient will perform sit to stand in prep for activites of daily living with assistance level (OT) Description: LTG:  Patient will perform sit to stand in prep for activites of daily living with assistance level (OT) Flowsheets (Taken 11/21/2023 1518) LTG: PT will perform sit to stand in prep for activites of daily living with assistance level: Supervision/Verbal cueing   Problem: RH Bathing Goal: LTG Patient will bathe all body parts with assist levels (OT) Description: LTG: Patient will bathe all body parts with assist levels (OT) Flowsheets (Taken 11/21/2023 1518) LTG: Pt will perform bathing with assistance level/cueing: Supervision/Verbal cueing   Problem: RH Dressing Goal: LTG Patient will perform lower body dressing w/assist (OT) Description: LTG: Patient will perform lower body dressing with assist, with/without cues in positioning using equipment (OT) Flowsheets (Taken 11/21/2023 1518) LTG: Pt will perform lower body dressing with assistance level of: Supervision/Verbal cueing   Problem: RH Toileting Goal: LTG Patient will perform toileting task (3/3 steps) with assistance level (OT) Description: LTG: Patient will perform toileting task (3/3 steps) with assistance level (OT)  Flowsheets (Taken 11/21/2023 1518) LTG: Pt will perform toileting task (3/3 steps) with assistance level: Supervision/Verbal cueing   Problem: RH Functional Use of Upper Extremity Goal: LTG Patient will use RT/LT upper extremity as a (OT) Description: LTG: Patient will use right/left upper extremity as a stabilizer/gross  assist/diminished/nondominant/dominant level with assist, with/without cues during functional activity (OT) Flowsheets (Taken 11/19/2023 1524 by Collier Salina, OT) LTG: Use of upper extremity in functional activities: LUE as diminished level LTG: Pt will use upper extremity in functional activity with assistance level of: Independent   Problem: RH Toilet Transfers Goal: LTG Patient will perform toilet transfers w/assist (OT) Description: LTG: Patient will perform toilet transfers with assist, with/without cues using equipment (OT) Flowsheets (Taken 11/21/2023 1518) LTG: Pt will perform toilet transfers with assistance level of: Supervision/Verbal cueing   Problem: RH Tub/Shower Transfers Goal: LTG Patient will perform tub/shower transfers w/assist (OT) Description: LTG: Patient will perform tub/shower transfers with assist, with/without cues using equipment (OT) Flowsheets (Taken 11/21/2023 1518) Problem: RH Balance Goal: LTG Patient will maintain dynamic standing with ADLs (OT) Description: LTG:  Patient will maintain dynamic standing balance with assist during activities of daily living (OT)  Flowsheets (Taken 11/21/2023 1518) LTG: Pt will maintain dynamic standing balance during ADLs with: Supervision/Verbal cueing   Problem: RH Bathing Goal: LTG Patient will bathe all body parts with assist levels (OT) Description: LTG: Patient will bathe all body parts with assist levels (OT) Flowsheets (Taken 11/21/2023 1518) LTG: Pt will perform bathing with assistance level/cueing: Supervision/Verbal cueing   Problem: RH Simple Meal Prep Goal: LTG Patient will perform simple meal prep w/assist (OT) Description: LTG: Patient will perform simple meal prep with assistance, with/without cues (OT). Flowsheets (Taken 11/21/2023 1518) LTG: Pt will perform simple meal prep with assistance level of: Supervision/Verbal cueing   LTG: Pt will perform tub/shower stall transfers with assistance level  of: Supervision/Verbal cueing

## 2023-11-21 NOTE — Progress Notes (Signed)
   11/21/23 0130  Vitals  Temp 97.6 F (36.4 C)  Temp Source Oral  BP 134/70  MAP (mmHg) 90  BP Location Left Arm  BP Method Automatic  Pulse Rate 76  Pulse Rate Source Monitor  ECG Heart Rate 77  Resp 18  Oxygen Therapy  SpO2 96 %  O2 Device Room Air  During Treatment Monitoring  Blood Flow Rate (mL/min) 0 mL/min  Arterial Pressure (mmHg) -9.29 mmHg  Venous Pressure (mmHg) 75.95 mmHg  TMP (mmHg) 0.8 mmHg  Ultrafiltration Rate (mL/min) 389 mL/min  Dialysate Flow Rate (mL/min) 300 ml/min  Dialysate Potassium Concentration 3  Dialysate Calcium Concentration 2.5  Duration of HD Treatment -hour(s) 3.5 hour(s)  Cumulative Fluid Removed (mL) per Treatment  0.06  HD Safety Checks Performed Yes  Intra-Hemodialysis Comments Tx completed  Post Treatment  Dialyzer Clearance Lightly streaked  Liters Processed 84  Fluid Removed (mL) 0 mL  Tolerated HD Treatment Yes  Post-Hemodialysis Comments pt treatment uneventful  AVG/AVF Arterial Site Held (minutes) 10 minutes  AVG/AVF Venous Site Held (minutes) 10 minutes  Fistula / Graft Right Upper arm Arteriovenous fistula  No placement date or time found.   Placed prior to admission: Yes  Orientation: Right  Access Location: Upper arm  Access Type: Arteriovenous fistula  Site Condition No complications  Fistula / Graft Assessment Present;Thrill;Bruit  Status Deaccessed  Needle Size 15  Drainage Description None

## 2023-11-21 NOTE — Progress Notes (Signed)
[facility] Progress Note   Subjective:   Patient seen and examined at bedside.  Dizziness improved.  Reports being tired this AM due to late night HD.  Not happy with going so late.  Discussed HD timing and variability in hospital setting. Denies CP, SOB, abdominal pain and n/v/d. States feeling in L arm improving.  Hopeful that sensation will continue to improve on the L side.  Reports R shoulder stiffness.  Advised to discuss with PT for exercises to help improve ROM.  Frustrated because he feels that every time things start improving and looking better, something drastic happens like this CVA. Consoled and encouraged to continue to work with PT in to improve strength and functionality.   Objective Vitals:   11/20/23 2330 11/21/23 0001 11/21/23 0130 11/21/23 0238  BP: 117/73 133/79 134/70 125/82  Pulse: 73 74 76 86  Resp: 17 16 18 19   Temp:   97.6 F (36.4 C) 97.6 F (36.4 C)  TempSrc:   Oral Oral  SpO2: 98% 100% 96% 100%  Weight:      Height:       Physical Exam General:WDWN male in NAD Heart:RRR, no mrg Lungs:CTAB, nml WOB on RA Abdomen:soft, NTND Extremities:no LE edema Dialysis Access: RU AVF +b/t   Filed Weights   11/18/23 1445 11/18/23 1843 11/19/23 0500  Weight: 107.6 kg 104.3 kg 104.3 kg    Intake/Output Summary (Last 24 hours) at 11/21/2023 1157 Last data filed at 11/21/2023 9629 Gross per 24 hour  Intake 720 ml  Output 0 ml  Net 720 ml    Additional Objective Labs: Basic Metabolic Panel: Recent Labs  Lab 11/15/23 0953 11/15/23 0958 11/16/23 0613 11/18/23 0515  NA 135 135 132* 134*  K 4.2 4.1 3.8 3.9  CL 94* 94* 91* 95*  CO2 27  --  27 25  GLUCOSE 155* 148* 185* 166*  BUN 40* 37* 54* 50*  CREATININE 8.38* 8.60* 10.01* 10.21*  CALCIUM 8.6*  --  8.9 8.9  PHOS  --   --   --  6.6*   Liver Function Tests: Recent Labs  Lab 11/15/23 0953 11/18/23 0515  AST 16  --   ALT 15  --   ALKPHOS 101  --   BILITOT 0.6  --   PROT 6.0*  --    ALBUMIN 3.0* 3.3*   CBC: Recent Labs  Lab 11/15/23 0953 11/15/23 0958 11/16/23 0613 11/18/23 0515  WBC 7.3  --  6.8 7.2  NEUTROABS 4.7  --   --   --   HGB 11.1* 11.9* 12.0* 12.2*  HCT 37.4* 35.0* 36.8* 37.8*  MCV 91.2  --  82.7 83.1  PLT 224  --  271 276    Medications:   aspirin  81 mg Oral Daily   atorvastatin  80 mg Oral QHS   buPROPion  150 mg Oral Daily   calcitRIOL  0.5 mcg Oral Q M,W,F-1800   calcium acetate  1,334 mg Oral BID WC   Chlorhexidine Gluconate Cloth  6 each Topical Q0600   clopidogrel  75 mg Oral Daily   dorzolamide-timolol  1 drop Both Eyes BID   FLUoxetine  20 mg Oral QHS   heparin  5,000 Units Subcutaneous Q8H   insulin aspart  0-6 Units Subcutaneous TID WC   insulin glargine-yfgn  13 Units Subcutaneous BID   latanoprost  1 drop Left Eye QHS   metoprolol succinate  25 mg Oral Daily   pantoprazole  40 mg Oral  QHS    Dialysis Orders: MWF - [facility] ([address]) - [facility] team 4hr, 450/800, EDW 94.8kg, 2K/2.5Ca, AVF, no heparin - Calcitriol 0.89mcg q HD - Mircera q 4 week - Venofer 100mg  x 10 (in middle of this) - 2.5L UFG max per cardiologist   Assessment/Plan: 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 Was out of window for thrombolytics per notes. 2. ESRD: Usual MWF schedule. Holiday schedule this week, Sunday, Tuesday and Friday.  Next HD 12/22. 3. HTN/volume:  BP better, no edema on exam. 2.5L UFG max per notes. No UF with HD yesterday.  4. Anemia: Hgb 12.2, no ESA for now. 5. Secondary hyperparathyroidism:  Ca ok, Phos high - have increased Phoslo dose.  Repeat labs with HD tomorrow.  6. T2DM 7. Hx prior CVA: On statin + plavix 8. HFpEF  Virgina Norfolk, PA-C [facility] 11/21/2023,11:57 AM  LOS: 4 days

## 2023-11-21 NOTE — Progress Notes (Signed)
Physical Therapy Session Note  Patient Details  Name: Bobby Sampson MRN: [id number] Date of Birth: 03/14/1961  Today's Date: 11/21/2023 PT Individual Time:  1101-1200  PT Individual Time Calculation (min): 59 min  Short Term Goals: Week 1:  PT Short Term Goal 1 (Week 1): pt will ambulate 150 feet with LRAD and CGA PT Short Term Goal 2 (Week 1): pt will perform sit to stand with LRAD and CGA PT Short Term Goal 3 (Week 1): pt will perform bed to chair transfer with LRAD and CGA  Skilled Therapeutic Interventions/Progress Updates:  Patient seated upright in recliner on entrance to room. Patient alert and agreeable to PT session. He relates desire to attempt to ambulate to see how this will affect his BP.   Patient with no pain complaint at start of session.  Therapeutic Activity: Transfers: Pt performed sit<>stand and stand pivot transfers throughout session with CGA/ MinA and improving to CGA/ supervision. Provided vc for technique.  Gait Training:  3# aw added to pt's L ankle for improved sensation throughout ambulation. Pt ambulated 108 ft using RW with CGA/ MinA with w/c follow for fatigue/ potential lightheadedness. Demonstrated forward lean with increased pressure into BUE and overshooting knee flexion/ adduction during swing phase of LLE d/t reduced sensation. This causes L heel to brush against R lower leg. Is able to demonstrate heel-to-toe progression with focus. Unable to correct with vc only during ambulation. Pt ends each amb bout with c/o of increased soreness to L shoulder as well as minimal increase in lightheadedness. Ambulates to complete distance into main therapy gym, additional 75 feet with similar quality of gait as above.   Following NMR, pt is able to ambulate full distance back to room with fatigue noted in final 15 ft. Completes distance with CGA/ MinA and w/c follow for fatigue. Continued vc for decreasing hip/ knee flexion and hitting of L heel into R lower leg.    Neuromuscular Re-ed: NMR facilitated during session with focus on standing balance and motor control/ proprioception of LLE. Pt guided in toe taps to 8" step d/t maintained strength with reduced sensation/ proprioception. Overshoots in knee/ hip flexion to raise foot from floor and in extending knee to place foot on step.  Pt is able to tolerate looking down at LLE without increase in lightheadedness. With visual of LE movement and vc for gentle placement on step, pt is able to improve ability to control placement of foot on step without too much extraneous motion. Requires strong block of heel in raising foot from floor or step in attempt to reduce motion. With block and vc pt demos minimal improvement throughout. Pt very motivated to improve.   NMR performed for improvements in motor control and coordination, balance, sequencing, judgement, and self confidence/ efficacy in performing all aspects of mobility at highest level of independence.   Patient supine in bed at end of session with brakes locked, bed alarm set, and all needs within reach. NT in room.    Therapy Documentation Precautions:  Precautions Precautions: Fall Precaution Comments: L hemi; residual R-sided hemi, visual deficits at baseline; HD MWF; fluid restriction Restrictions Weight Bearing Restrictions Per Provider Order: No   Pain: Pain Assessment Pain Scale: 0-10 Pain Score: 0-No pain related during session, but pt does c/o increased soreness in L shoulder with use of RW. Addressed with repositioning.   Therapy/Group: Individual Therapy  Loel Dubonnet PT, DPT, CSRS 11/20/2023, 5:36 PM

## 2023-11-21 NOTE — Progress Notes (Signed)
PROGRESS NOTE   Subjective/Complaints: Came back late from by dialysis today.  He thinks he may have dialysis again tomorrow, feels like left upper extremity left lower extremity coordination improved.  No double vision.  No swallowing issues.  Gait improved with left ankle weight  ROS: Patient denies fever, rash, sore throat, blurred vision, dizziness, nausea, vomiting, diarrhea, cough, shortness of breath or chest pain, joint or back/neck pain, headache, or mood change.  + insomnia Numbness-improving gradually   Objective:   No results found. No results for input(s): "WBC", "HGB", "HCT", "PLT" in the last 72 hours.  No results for input(s): "NA", "K", "CL", "CO2", "GLUCOSE", "BUN", "CREATININE", "CALCIUM" in the last 72 hours.   Intake/Output Summary (Last 24 hours) at 11/21/2023 1402 Last data filed at 11/21/2023 0981 Gross per 24 hour  Intake 600 ml  Output 0 ml  Net 600 ml        Physical Exam: Vital Signs Blood pressure 125/82, pulse 86, temperature 97.6 F (36.4 C), temperature source Oral, resp. rate 19, height 5\' 9"  (1.753 m), weight 104.3 kg, SpO2 100%.    General: No acute distress Mood and affect are appropriate Heart: Regular rate and rhythm no rubs murmurs or extra sounds Lungs: Clear to auscultation, breathing unlabored, no rales or wheezes Abdomen: Positive bowel sounds, soft nontender to palpation, nondistended Extremities: No clubbing, cyanosis, or edema Skin: No evidence of breakdown, no evidence of rash  Sensory exam normal sensation to light touch and proprioception in bilateral upper and lower extremities Cerebellar exam normal finger to nose to finger Musculoskeletal: Full range of motion in all 4 extremities. No joint swelling   MMT: RUE and RLE 5/5 prox to distal. LUE 4+/5 prox to distal  Impaired FTN/FMC.   LLE 4+/5.   Sensation touch sensation intact left upper extremity. No abnl  resting tone.  Pt with good sitting balance.   - unchagned 12/20 Musculoskeletal:  Missing digits 1 and 2 on the right     Assessment/Plan: 1. Functional deficits which require 3+ hours per day of interdisciplinary therapy in a comprehensive inpatient rehab setting. Physiatrist is providing close team supervision and 24 hour management of active medical problems listed below. Physiatrist and rehab team continue to assess barriers to discharge/monitor patient progress toward functional and medical goals  Care Tool:  Bathing    Body parts bathed by patient: Right arm, Left arm, Chest, Abdomen, Front perineal area, Buttocks, Right upper leg, Left upper leg, Right lower leg, Left lower leg, Face         Bathing assist Assist Level: Contact Guard/Touching assist     Upper Body Dressing/Undressing Upper body dressing   What is the patient wearing?: Pull over shirt    Upper body assist Assist Level: Set up assist    Lower Body Dressing/Undressing Lower body dressing      What is the patient wearing?: Pants, Underwear/pull up     Lower body assist Assist for lower body dressing: Contact Guard/Touching assist     Toileting Toileting Toileting Activity did not occur (Clothing management and hygiene only): N/A (no void or bm)  Toileting assist Assist for toileting: Minimal Assistance - Patient >  75%     Transfers Chair/bed transfer  Transfers assist     Chair/bed transfer assist level: Moderate Assistance - Patient 50 - 74%     Locomotion Ambulation   Ambulation assist      Assist level: Moderate Assistance - Patient 50 - 74% Assistive device: Walker-rolling Max distance: 147   Walk 10 feet activity   Assist     Assist level: Minimal Assistance - Patient > 75% Assistive device: Walker-rolling   Walk 50 feet activity   Assist    Assist level: Minimal Assistance - Patient > 75% Assistive device: Walker-rolling    Walk 150 feet  activity   Assist Walk 150 feet activity did not occur: Safety/medical concerns (fatigue)         Walk 10 feet on uneven surface  activity   Assist Walk 10 feet on uneven surfaces activity did not occur: Safety/medical concerns (fatigue)         Wheelchair     Assist Is the patient using a wheelchair?: Yes Type of Wheelchair: Manual    Wheelchair assist level: Supervision/Verbal cueing Max wheelchair distance: 150    Wheelchair 50 feet with 2 turns activity    Assist        Assist Level: Supervision/Verbal cueing   Wheelchair 150 feet activity     Assist      Assist Level: Supervision/Verbal cueing   Blood pressure 125/82, pulse 86, temperature 97.6 F (36.4 C), temperature source Oral, resp. rate 19, height 5\' 9"  (1.753 m), weight 104.3 kg, SpO2 100%.  Medical Problem List and Plan: 1. 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  Antithrombotics: -DVT/anticoagulation:  Pharmaceutical: Heparin             -antiplatelet therapy: Aspirin and Plavix for three weeks followed by Plavix alone   3. Pain Management: Tylenol as needed   4. Mood/Behavior/Sleep: LCSW to evaluate and provide emotional support             -continue Wellbutrin XL and Prozac             -antipsychotic agents: n/a   5. Neuropsych/cognition: This patient is capable of making decisions on his own behalf.   6. Skin/Wound Care: Routine skin care checks   7. Fluids/Electrolytes/Nutrition: Strict Is and Os.  Laboratory follow-up per nephrology             -renal diet; 1200 mL fluid restriction   8: Hypertension: monitor TID and prn             -continue Toprol-XL 25 mg daily  -12/20 baseline blood pressures stable, continue current  regimen for now    11/21/2023    2:38 AM 11/21/2023    1:30 AM 11/21/2023   12:01 AM  Vitals with BMI  Systolic 125 134 440  Diastolic 82 70 79  Pulse 86 76 74      9: Hyperlipidemia: continue statin   10: Glaucoma: continue Cosopt and Xalatan   11: DM: CBGs QID, A1c = 6.5% (home regimen: insulin glargine 20 units twice daily, NovoLog insulin 7 units 3 times daily with meals)              -  continue Novolog 5 units with meals             -continue Semglee 15 units BID  -12/18 dc NovoLog  with meals due to hypoglycemia last night, restart correctional, Decrease semglee to 13u BID  -12/20 CBGs fair control, continue current regimen for now   CBG (last 3)  Recent Labs    11/21/23 0239 11/21/23 0648 11/21/23 1110  GLUCAP 118* 170* 257*   Controlled except for elevation at 11 AM will continue to monitor  12: ESRD on HD on M/W/F via RUE AVF             - on Phoslo BID and Rocaltrol M/W/F             -HD at the end of the day to allow participation in therapies  -Reviewed nephrology note, HD today  13: OSA: CPAP  -12/19 reports good results with CPAP last night -12/20 patient reports he did not qualify for CPAP during the study he was doing  14: CHF: echo>>55-60%, grade 1 DD -volume status per nephrology/HD -No signs to indicate fluid overload -Weights appear little down Filed Weights   11/18/23 1445 11/18/23 1843 11/19/23 0500  Weight: 107.6 kg 104.3 kg 104.3 kg      15: Obesity: BMI 34.61  16.  Orthostatic hypotension   -Compression stockings and abdominal binder  -Discussed with nephrology today, HD ordered to be adjusted today, no UF/not pulling any fluids off today    LOS: 4 days A FACE TO FACE EVALUATION WAS PERFORMED  Erick Colace 11/21/2023, 2:02 PM

## 2023-11-22 DIAGNOSIS — E0843 Diabetes mellitus due to underlying condition with diabetic autonomic (poly)neuropathy: Secondary | ICD-10-CM | POA: Diagnosis not present

## 2023-11-22 DIAGNOSIS — N186 End stage renal disease: Secondary | ICD-10-CM | POA: Diagnosis not present

## 2023-11-22 DIAGNOSIS — I63012 Cerebral infarction due to thrombosis of left vertebral artery: Secondary | ICD-10-CM | POA: Diagnosis not present

## 2023-11-22 DIAGNOSIS — Z992 Dependence on renal dialysis: Secondary | ICD-10-CM | POA: Diagnosis not present

## 2023-11-22 LAB — CBC
HCT: 40.4 % (ref 39.0–52.0)
Hemoglobin: 12.9 g/dL — ABNORMAL LOW (ref 13.0–17.0)
MCH: 26.9 pg (ref 26.0–34.0)
MCHC: 31.9 g/dL (ref 30.0–36.0)
MCV: 84.2 fL (ref 80.0–100.0)
Platelets: 240 10*3/uL (ref 150–400)
RBC: 4.8 MIL/uL (ref 4.22–5.81)
RDW: 15.7 % — ABNORMAL HIGH (ref 11.5–15.5)
WBC: 7.7 10*3/uL (ref 4.0–10.5)
nRBC: 0 % (ref 0.0–0.2)

## 2023-11-22 LAB — GLUCOSE, CAPILLARY
Glucose-Capillary: 200 mg/dL — ABNORMAL HIGH (ref 70–99)
Glucose-Capillary: 250 mg/dL — ABNORMAL HIGH (ref 70–99)
Glucose-Capillary: 224 mg/dL — ABNORMAL HIGH (ref 70–99)

## 2023-11-22 LAB — RENAL FUNCTION PANEL
Albumin: 3.6 g/dL (ref 3.5–5.0)
Anion gap: 16 — ABNORMAL HIGH (ref 5–15)
BUN: 55 mg/dL — ABNORMAL HIGH (ref 8–23)
CO2: 25 mmol/L (ref 22–32)
Calcium: 9.4 mg/dL (ref 8.9–10.3)
Chloride: 91 mmol/L — ABNORMAL LOW (ref 98–111)
Creatinine, Ser: 10.39 mg/dL — ABNORMAL HIGH (ref 0.61–1.24)
GFR, Estimated: 5 mL/min — ABNORMAL LOW (ref >60)
Glucose, Bld: 275 mg/dL — ABNORMAL HIGH (ref 70–99)
Phosphorus: 5 mg/dL — ABNORMAL HIGH (ref 2.5–4.6)
Potassium: 4.9 mmol/L (ref 3.5–5.1)
Sodium: 132 mmol/L — ABNORMAL LOW (ref 135–145)

## 2023-11-22 MED ORDER — ALBUMIN HUMAN 25 % IV SOLN: 25.0000 g | Freq: Once | INTRAVENOUS | Status: DC

## 2023-11-22 MED ORDER — ALTEPLASE 2 MG IJ SOLR: 2.0000 mg | Freq: Once | INTRAMUSCULAR | Status: DC | PRN

## 2023-11-22 MED ORDER — LIDOCAINE HCL (PF) 1 % IJ SOLN: 5.0000 mL | INTRAMUSCULAR | Status: DC | PRN

## 2023-11-22 MED ORDER — PENTAFLUOROPROP-TETRAFLUOROETH EX AERO: 1.0000 | INHALATION_SPRAY | CUTANEOUS | Status: DC | PRN

## 2023-11-22 MED ORDER — LIDOCAINE-PRILOCAINE 2.5-2.5 % EX CREA
1.0000 | TOPICAL_CREAM | CUTANEOUS | Status: DC | PRN
Start: 2023-11-22 — End: 2023-11-22

## 2023-11-22 MED ORDER — ANTICOAGULANT SODIUM CITRATE 4% (200MG/5ML) IV SOLN: 5.0000 mL | Status: DC | PRN

## 2023-11-22 MED ORDER — HEPARIN SODIUM (PORCINE) 1000 UNIT/ML DIALYSIS: 1000.0000 [IU] | INTRAMUSCULAR | Status: DC | PRN

## 2023-11-22 NOTE — Progress Notes (Signed)
[facility] Progress Note   Subjective:   Patient seen and examined at bedside in HD.  Tolerating well so far.  A little down today due to finding out he would not be able to go home before Christmas.  Consoled.  Reports improvement in constipation.  Denies CP, SOB, abdominal pain and n/v/d.   Objective Vitals:   11/21/23 2052 11/22/23 0443 11/22/23 1402 11/22/23 1403  BP: 126/68 135/72  138/70  Pulse: 73 73  72  Resp: 16 16  14   Temp: 97.7 F (36.5 C) 98 F (36.7 C)  (!) 97.3 F (36.3 C)  TempSrc: Oral Oral  Oral  SpO2: 99% 98%  99%  Weight:   92.8 kg   Height:       Physical Exam General:WDWN male in NAD Heart:RRR, no mrg Lungs:CTAB, nml WOB on RA Abdomen:soft, NTND Extremities:no LE edema Dialysis Access: RU AVF +b/t   Filed Weights   11/18/23 1843 11/19/23 0500 11/22/23 1402  Weight: 104.3 kg 104.3 kg 92.8 kg    Intake/Output Summary (Last 24 hours) at 11/22/2023 1412 Last data filed at 11/22/2023 1343 Gross per 24 hour  Intake 720 ml  Output --  Net 720 ml    Additional Objective Labs: Basic Metabolic Panel: Recent Labs  Lab 11/16/23 0613 11/18/23 0515 11/22/23 1335  NA 132* 134* 132*  K 3.8 3.9 4.9  CL 91* 95* 91*  CO2 27 25 25   GLUCOSE 185* 166* 275*  BUN 54* 50* 55*  CREATININE 10.01* 10.21* 10.39*  CALCIUM 8.9 8.9 9.4  PHOS  --  6.6* 5.0*   Liver Function Tests: Recent Labs  Lab 11/18/23 0515 11/22/23 1335  ALBUMIN 3.3* 3.6   CBC: Recent Labs  Lab 11/16/23 0613 11/18/23 0515 11/22/23 1335  WBC 6.8 7.2 7.7  HGB 12.0* 12.2* 12.9*  HCT 36.8* 37.8* 40.4  MCV 82.7 83.1 84.2  PLT 271 276 240   CBG: Recent Labs  Lab 11/21/23 1110 11/21/23 1647 11/21/23 2126 11/22/23 0631 11/22/23 1121  GLUCAP 257* 243* 198* 200* 250*    Medications:  albumin human     anticoagulant sodium citrate      aspirin  81 mg Oral Daily   atorvastatin  80 mg Oral QHS   buPROPion  150 mg Oral Daily   calcitRIOL  0.5 mcg Oral Q  M,W,F-1800   calcium acetate  1,334 mg Oral BID WC   Chlorhexidine Gluconate Cloth  6 each Topical Q0600   Chlorhexidine Gluconate Cloth  6 each Topical Q0600   clopidogrel  75 mg Oral Daily   dorzolamide-timolol  1 drop Both Eyes BID   FLUoxetine  20 mg Oral QHS   heparin  5,000 Units Subcutaneous Q8H   insulin aspart  0-6 Units Subcutaneous TID WC   insulin glargine-yfgn  13 Units Subcutaneous BID   latanoprost  1 drop Left Eye QHS   metoprolol succinate  25 mg Oral Daily   pantoprazole  40 mg Oral QHS    Dialysis Orders: MWF - [facility] ([address]) - [facility] team 4hr, 450/800, EDW 94.8kg, 2K/2.5Ca, AVF, no heparin - Calcitriol 0.75mcg q HD - Mircera q 4 week - Venofer 100mg  x 10 (in middle of this) - 2.5L UFG max per cardiologist   Assessment/Plan: 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 Was out of window for thrombolytics per notes. 2. ESRD: Usual MWF schedule. Holiday schedule this week, Sunday, Tuesday and Friday.  Next HD  today. 3. HTN/volume:  BP better, no edema on exam. 2.5L UFG max per notes. No UF with HD yesterday.  4. Anemia: Hgb 12.9, no ESA for now. 5. Secondary hyperparathyroidism:  Ca ok, Phos in goal.  Increased Phoslo dose on 12/18. Continue VDRA. 6. T2DM 7. Hx prior CVA: On statin + plavix 8. HFpEF  Virgina Norfolk, PA-C [facility] 11/22/2023,2:12 PM  LOS: 5 days

## 2023-11-22 NOTE — Plan of Care (Signed)
  Problem: RH Memory Goal: LTG Patient will use memory compensatory aids to (SLP) Description: LTG:  Patient will use memory compensatory aids to recall biographical/new, daily complex information with cues (SLP) Flowsheets (Taken 11/22/2023 1529) LTG: Patient will use memory compensatory aids to (SLP): Modified Independent   Problem: RH Attention Goal: LTG Patient will demonstrate this level of attention during functional activites (SLP) Description: LTG:  Patient will will demonstrate this level of attention during functional activites (SLP) Flowsheets (Taken 11/22/2023 1529) Patient will demonstrate during cognitive/linguistic activities the attention type of: Alternating Patient will demonstrate this level of attention during cognitive/linguistic activities in: Home LTG: Patient will demonstrate this level of attention during cognitive/linguistic activities with assistance of (SLP): Modified Independent Number of minutes patient will demonstrate attention during cognitive/linguistic activities: 7-8 minutes   Problem: RH Awareness Goal: LTG: Patient will demonstrate awareness during functional activites type of (SLP) Description: LTG: Patient will demonstrate awareness during functional activites type of (SLP) Flowsheets (Taken 11/22/2023 1529) Patient will demonstrate during cognitive/linguistic activities awareness type of: Anticipatory LTG: Patient will demonstrate awareness during cognitive/linguistic activities with assistance of (SLP): Modified Independent

## 2023-11-22 NOTE — Evaluation (Signed)
Speech Language Pathology Assessment and Plan  Patient Details  Name: Bobby Sampson MRN: [id number] Date of Birth: May 04, 1961  SLP Diagnosis: Cognitive Impairments  Rehab Potential: Good ELOS: 7-10 days    Today's Date: 11/22/2023 SLP Individual Time: 3086-5784 SLP Individual Time Calculation (min): 50 min   Hospital Problem: Principal Problem:   CVA (cerebral vascular accident) [facility])  Past Medical History:  Past Medical History:  Diagnosis Date   Diabetes mellitus without complication (HCC)    Hypertension    Neuropathy    Renal disorder    Stroke (HCC)    2016   Past Surgical History:  Past Surgical History:  Procedure Laterality Date   INTRAMEDULLARY (IM) NAIL INTERTROCHANTERIC Right 11/02/2021   Procedure: INTRAMEDULLARY (IM) NAIL INTERTROCHANTRIC;  Surgeon: Juanell Fairly, MD;  Location: [facility] ORS;  Service: Orthopedics;  Laterality: Right;   TONSILLECTOMY      Assessment / Plan / Recommendation Clinical Impression  Bobby Sampson is a 62 year old male who awoke on the morning of 11/15/2023 with left-sided numbness and weakness which is different from his baseline. He was transported to [facility] emergency department via [facility] who initiated code stroke. 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 Code stroke CT head with no acute process, Aspects 10. 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 MRI revealed acute infarct in the right dorsal pons. Permissive hypertension allowed. Loaded with aspirin and Plavix now on aspirin 81 mg daily and Plavix 75 mg daily for 3 weeks followed by Plavix alone. 2D echo with ejection fraction of 55 to 60%, grade 1 diastolic dysfunction. Patient has a history of hypertension, CHF, diabetes mellitus (hemoglobin A1c = 6.5%), end-stage renal disease  on hemodialysis Mondays, Wednesdays and Fridays. Therapy consults obtained. Tolerating renal diet with fluid restriction 1200 mL. Thin liquids. Patient requires mod assist for ambulation with rolling walker, mod to min assist for sit to stand and mod assist for transfers. He lives with his wife in a 1 level home with ramped entrance. [facility], but has had some falls recently. His wife is a CNA and she drives, does IADLs.The patient requires inpatient medicine and rehabilitation evaluations and services for ongoing dysfunction secondary to right dorsal pontine infarct.   Pt presents with mild cognitive linguistic impairment characterized by decreased safety awareness, decreased attention, decreased recall of information after a delay. He was often distracted in conversation and eval tasks and needed redirection. He was oriented to date and day of the week. He needed additional time and cueing to recall verbal information containing details after a delay and cueing to increase awareness of walker safety. Comprehension and verbal expression were WFL. He is tolerating a regular diet and thin liquids. He demonstrated slower rate of speech but pt reports there is no change in speech.   Pt will benefit from skilled slp intervention to increase cognitive skills and safe return to prior living situation.    Skilled Therapeutic Interventions          SLP evaluation completed.St reviewed goals and plan of care.   SLP Assessment  Patient will need skilled Speech Lanaguage Pathology Services during CIR admission    Recommendations  Patient destination: Home Follow up Recommendations: Home Health SLP Equipment Recommended: None recommended by SLP    SLP Frequency 3  to 5 out of 7 days   SLP Duration  SLP Intensity  SLP Treatment/Interventions 7-10 days  Minumum of 1-2 x/day, 30 to 90 minutes  Cognitive remediation/compensation;Patient/family education;Therapeutic  Activities    Pain Pain Assessment Pain Scale: Faces Faces Pain Scale: No hurt  Prior Functioning Cognitive/Linguistic Baseline: Within functional limits Type of Home: House  Lives With: Spouse Available Help at Discharge: Family;Available 24 hours/day;Personal care attendant Vocation: Retired  SLP Evaluation Cognition Overall Cognitive Status: Impaired/Different from baseline Arousal/Alertness: Awake/alert Orientation Level: Oriented X4 Memory: Impaired Awareness: Appears intact Problem Solving: Appears intact Safety/Judgment: Impaired  Comprehension Auditory Comprehension Overall Auditory Comprehension: Appears within functional limits for tasks assessed Expression Expression Primary Mode of Expression: Verbal Verbal Expression Overall Verbal Expression: Appears within functional limits for tasks assessed Oral Motor    Care Tool Care Tool Cognition Ability to hear (with hearing aid or hearing appliances if normally used Ability to hear (with hearing aid or hearing appliances if normally used): 0. Adequate - no difficulty in normal conservation, social interaction, listening to TV   Expression of Ideas and Wants Expression of Ideas and Wants: 4. Without difficulty (complex and basic) - expresses complex messages without difficulty and with speech that is clear and easy to understand   Understanding Verbal and Non-Verbal Content Understanding Verbal and Non-Verbal Content: 4. Understands (complex and basic) - clear comprehension without cues or repetitions  Memory/Recall Ability Memory/Recall Ability : Current season;That he or she is in a hospital/hospital unit          Short Term Goals: Week 1: SLP Short Term Goal 1 (Week 1): Pt will increase recall for information related to safety and daily tasks after a delay with visual aids with Mod I. SLP Short Term Goal 2 (Week 1): Pt will increase safety awareness for everyday tasks with Mod I verbal cues. SLP Short Term  Goal 3 (Week 1): Pt will sustain attention for 7-8 minutes for tasks relating to ADL's with Mod I.  Refer to Care Plan for Long Term Goals  Recommendations for other services: None   Discharge Criteria: Patient will be discharged from SLP if patient refuses treatment 3 consecutive times without medical reason, if treatment goals not met, if there is a change in medical status, if patient makes no progress towards goals or if patient is discharged from hospital.  The above assessment, treatment plan, treatment alternatives and goals were discussed and mutually agreed upon: by patient  Carlean Jews Vaunda Gutterman 11/22/2023, 8:33 AM

## 2023-11-22 NOTE — Progress Notes (Signed)
Received patient in bed to unit.  Alert and oriented.  Informed consent signed and in chart.   TX duration: 4  Patient tolerated well.  Transported back to the room  Alert, without acute distress.  Hand-off given to patient's nurse.   Access used: RAVF Access issues: None  Total UF removed: 2 L Medication(s) given: Calcitrol   11/22/23 1823  Vitals  Temp 97.8 F (36.6 C)  BP (!) 141/77  BP Location Left Arm  BP Method Automatic  Patient Position (if appropriate) Lying  Pulse Rate 77  Pulse Rate Source Monitor  Resp (!) 23  Oxygen Therapy  SpO2 97 %  O2 Device Room Air  Patient Activity (if Appropriate) In bed  Pulse Oximetry Type Continuous  Oximetry Probe Site Changed No  During Treatment Monitoring  Blood Flow Rate (mL/min) 399 mL/min  Arterial Pressure (mmHg) -240.39 mmHg  Venous Pressure (mmHg) 240.19 mmHg  TMP (mmHg) 7.07 mmHg  Ultrafiltration Rate (mL/min) 703 mL/min  Dialysate Flow Rate (mL/min) 299 ml/min  Dialysate Potassium Concentration 2  Dialysate Calcium Concentration 2.5  Duration of HD Treatment -hour(s) 4 hour(s)  Cumulative Fluid Removed (mL) per Treatment  2000.15  HD Safety Checks Performed Yes  Intra-Hemodialysis Comments Tx completed;Tolerated well  Dialysis Fluid Bolus Normal Saline  Bolus Amount (mL) 300 mL   Mar Daring, LPN  [facility]

## 2023-11-22 NOTE — Progress Notes (Signed)
Occupational Therapy Session Note  Patient Details  Name: Bobby Sampson MRN: [id number] Date of Birth: [birth year]  Today's Date: 11/22/2023 OT Missed Time: 45 Minutes Missed Time Reason: Other (comment) (HD schedule)   Short Term Goals: Week 1:  OT Short Term Goal 1 (Week 1): STGs=LTGs due to patient's estimated length of stay.  Skilled Therapeutic Interventions/Progress Updates:  Pt missing ~45 minutes of skilled intervention due to HD schedule.   Therapy Documentation Precautions:  Precautions Precautions: Fall Precaution Comments: L hemi; residual R-sided hemi, visual deficits at baseline; HD MWF; fluid restriction Restrictions Weight Bearing Restrictions Per Provider Order: No   Therapy/Group: Individual Therapy  Lou Cal, OTR/L, MSOT  11/22/2023, 1:07 PM

## 2023-11-22 NOTE — Progress Notes (Signed)
Physical Therapy Session Note  Patient Details  Name: Bobby Sampson MRN: [id number] Date of Birth: [birth year]  Today's Date: 11/22/2023 PT Individual Time: 4401-0272 PT Individual Time Calculation (min): 62 min  and Today's Date: 11/22/2023 PT Missed Time: 45 Minutes Missed Time Reason: Unavailable (Comment) (dialysis)  Short Term Goals: Week 1:  PT Short Term Goal 1 (Week 1): pt will ambulate 150 feet with LRAD and CGA PT Short Term Goal 2 (Week 1): pt will perform sit to stand with LRAD and CGA PT Short Term Goal 3 (Week 1): pt will perform bed to chair transfer with LRAD and CGA  Skilled Therapeutic Interventions/Progress Updates:      Treatment Session 1  Pt seated in recliner upon arrival. Pt agreeable to therapy. Pt denies any pain.   Pt requesting to D/C on 12/26 to be home for his wife's Birthday on 12/28. Education provided to pt and to pt wife (over phone), that pt will require 24/7 assist upon discharge if he discharges on 12/26. Pt and wife verbalized understanding and agreeable. Pt wife reports she is a CNA and pt is doing better now than he has with previous hospital stay. Pt wife reports she gave pt a shower on 12/21 and feels comfortable with him discharging home on 12/26.  Pt wife scheduled to attend family training on 12/24 1-3 pm. Notified IDT. Education provided to pt and wife that date will need to be discussed with the team. Both agreeable.   Pt self propelled WC with BUE and B LE with room to west elevators with B UE/LE for B LE/UE strengthening, proprioception, activity tolerance, verbal cues provided for technique and improved L LE proprioception.   Pt ambulated 3x~160  feet with RW and min A and heavy UE support on RW with red 4# ankle weight around L LE, and red theraband around L LE, knee and hip to facilitate improved LE positioning and gait pattern, and weights wrapped in coband around dorsum of foot to facilitate improve heel toe pattern. Pt overall demos  improved gait pattern with these external cues, but with fatigue demonstrated continued knee flexion/adduction during swing phase of L LE.   Pt ambulated 2x~50 feet with no AD and L HHA, with mod A, and all external cues described above.   Pt seated in recliner at end of session with all needs within reach and bed alarm on.  Treatment Session 2   Pt off unit for dialysis. Pt missed 45 minutes 2/2 dialysis. Will attempt to make up as able.   Therapy Documentation Precautions:  Precautions Precautions: Fall Precaution Comments: L hemi; residual R-sided hemi, visual deficits at baseline; HD MWF; fluid restriction Restrictions Weight Bearing Restrictions Per Provider Order: No  Therapy/Group: Individual Therapy  [facility] Ambrose Finland, [place], DPT  11/22/2023, 7:47 AM

## 2023-11-22 NOTE — Plan of Care (Signed)
  Problem: Consults Goal: RH STROKE PATIENT EDUCATION Description: See Patient Education module for education specifics  Outcome: Progressing   Problem: RH BOWEL ELIMINATION Goal: RH STG MANAGE BOWEL WITH ASSISTANCE Description: STG Manage Bowel with toileting Assistance. Outcome: Progressing   Problem: RH SAFETY Goal: RH STG ADHERE TO SAFETY PRECAUTIONS W/ASSISTANCE/DEVICE Description: STG Adhere to Safety Precautions With cues Assistance/Device. Outcome: Progressing   Problem: RH KNOWLEDGE DEFICIT Goal: RH STG INCREASE KNOWLEDGE OF DIABETES Description: Patient and spouse will be able to manage DM with educational resources for medications and dietary modifications independently Outcome: Progressing Goal: RH STG INCREASE KNOWLEDGE OF HYPERTENSION Description: Patient and spouse will be able to manage HTN with educational resources for medications and dietary modifications independently Outcome: Progressing Goal: RH STG INCREASE KNOWLEGDE OF HYPERLIPIDEMIA Description: Patient and spouse will be able to manage HLD with educational resources for medications and dietary modifications independently Outcome: Progressing Goal: RH STG INCREASE KNOWLEDGE OF STROKE PROPHYLAXIS Description: Patient and spouse will be able to manage secondary risks with educational resources for medications and dietary modifications independently Outcome: Progressing   Problem: Education: Goal: Ability to describe self-care measures that may prevent or decrease complications (Diabetes Survival Skills Education) will improve Outcome: Progressing Goal: Individualized Educational Video(s) Outcome: Progressing   Problem: Coping: Goal: Ability to adjust to condition or change in health will improve Outcome: Progressing   Problem: Fluid Volume: Goal: Ability to maintain a balanced intake and output will improve Outcome: Progressing   Problem: Health Behavior/Discharge Planning: Goal: Ability to identify  and utilize available resources and services will improve Outcome: Progressing Goal: Ability to manage health-related needs will improve Outcome: Progressing   Problem: Metabolic: Goal: Ability to maintain appropriate glucose levels will improve Outcome: Progressing   Problem: Nutritional: Goal: Maintenance of adequate nutrition will improve Outcome: Progressing Goal: Progress toward achieving an optimal weight will improve Outcome: Progressing   Problem: Skin Integrity: Goal: Risk for impaired skin integrity will decrease Outcome: Progressing   Problem: Tissue Perfusion: Goal: Adequacy of tissue perfusion will improve Outcome: Progressing

## 2023-11-22 NOTE — Progress Notes (Signed)
PROGRESS NOTE   Subjective/Complaints: Discussed CBG, pt thinks they are up due to improved appetite DIscussed CVA risk factor reduction  ROS: Patient denies fever, rash, sore throat, blurred vision, dizziness, nausea, vomiting, diarrhea, cough, shortness of breath or chest pain, joint or back/neck pain, headache, or mood change.  + insomnia Numbness-improving gradually   Objective:   No results found. No results for input(s): "WBC", "HGB", "HCT", "PLT" in the last 72 hours.  No results for input(s): "NA", "K", "CL", "CO2", "GLUCOSE", "BUN", "CREATININE", "CALCIUM" in the last 72 hours.   Intake/Output Summary (Last 24 hours) at 11/22/2023 1256 Last data filed at 11/22/2023 0708 Gross per 24 hour  Intake 240 ml  Output --  Net 240 ml        Physical Exam: Vital Signs Blood pressure 135/72, pulse 73, temperature 98 F (36.7 C), temperature source Oral, resp. rate 16, height 5\' 9"  (1.753 m), weight 104.3 kg, SpO2 98%.    General: No acute distress Mood and affect are appropriate Heart: Regular rate and rhythm no rubs murmurs or extra sounds Lungs: Clear to auscultation, breathing unlabored, no rales or wheezes Abdomen: Positive bowel sounds, soft nontender to palpation, nondistended Extremities: No clubbing, cyanosis, or edema Skin: No evidence of breakdown, no evidence of rash  Sensory exam normal sensation to light touch and proprioception in bilateral upper and lower extremities Cerebellar exam mild dysmetria LLE>LUE  Musculoskeletal: Full range of motion in all 4 extremities. No joint swelling   MMT: RUE and RLE 5/5 prox to distal. LUE 4+/5 prox to distal  Impaired FTN/FMC LUE.   LLE 4+/5.   Sensation touch sensation intact left upper extremity. No abnl resting tone.   Musculoskeletal:  Missing digits 1 and 2 on the right     Assessment/Plan: 1. Functional deficits which require 3+ hours per day  of interdisciplinary therapy in a comprehensive inpatient rehab setting. Physiatrist is providing close team supervision and 24 hour management of active medical problems listed below. Physiatrist and rehab team continue to assess barriers to discharge/monitor patient progress toward functional and medical goals  Care Tool:  Bathing    Body parts bathed by patient: Right arm, Left arm, Chest, Abdomen, Front perineal area, Buttocks, Right upper leg, Left upper leg, Right lower leg, Left lower leg, Face         Bathing assist Assist Level: Contact Guard/Touching assist     Upper Body Dressing/Undressing Upper body dressing   What is the patient wearing?: Pull over shirt    Upper body assist Assist Level: Set up assist    Lower Body Dressing/Undressing Lower body dressing      What is the patient wearing?: Pants, Underwear/pull up     Lower body assist Assist for lower body dressing: Contact Guard/Touching assist     Toileting Toileting Toileting Activity did not occur (Clothing management and hygiene only): N/A (no void or bm)  Toileting assist Assist for toileting: Minimal Assistance - Patient > 75%     Transfers Chair/bed transfer  Transfers assist     Chair/bed transfer assist level: Moderate Assistance - Patient 50 - 74%     Locomotion Ambulation   Ambulation assist  Assist level: Moderate Assistance - Patient 50 - 74% Assistive device: Walker-rolling Max distance: 147   Walk 10 feet activity   Assist     Assist level: Minimal Assistance - Patient > 75% Assistive device: Walker-rolling   Walk 50 feet activity   Assist    Assist level: Minimal Assistance - Patient > 75% Assistive device: Walker-rolling    Walk 150 feet activity   Assist Walk 150 feet activity did not occur: Safety/medical concerns (fatigue)         Walk 10 feet on uneven surface  activity   Assist Walk 10 feet on uneven surfaces activity did not occur:  Safety/medical concerns (fatigue)         Wheelchair     Assist Is the patient using a wheelchair?: Yes Type of Wheelchair: Manual    Wheelchair assist level: Supervision/Verbal cueing Max wheelchair distance: 150    Wheelchair 50 feet with 2 turns activity    Assist        Assist Level: Supervision/Verbal cueing   Wheelchair 150 feet activity     Assist      Assist Level: Supervision/Verbal cueing   Blood pressure 135/72, pulse 73, temperature 98 F (36.7 C), temperature source Oral, resp. rate 16, height 5\' 9"  (1.753 m), weight 104.3 kg, SpO2 98%.  Medical Problem List and Plan: 1. 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  Antithrombotics: -DVT/anticoagulation:  Pharmaceutical: Heparin             -antiplatelet therapy: Aspirin and Plavix for three weeks followed by Plavix alone   3. Pain Management: Tylenol as needed   4. Mood/Behavior/Sleep: LCSW to evaluate and provide emotional support             -continue Wellbutrin XL and Prozac             -antipsychotic agents: n/a   5. Neuropsych/cognition: This patient is capable of making decisions on his own behalf.   6. Skin/Wound Care: Routine skin care checks   7. Fluids/Electrolytes/Nutrition: Strict Is and Os.  Laboratory follow-up per nephrology             -renal diet; 1200 mL fluid restriction   8: Hypertension: monitor TID and prn             -continue Toprol-XL 25 mg daily  -12/20 baseline blood pressures stable, continue current regimen for now    11/22/2023    4:43 AM 11/21/2023    8:52 PM 11/21/2023    2:38 PM  Vitals with BMI  Systolic 135 126 403  Diastolic 72 68 72  Pulse 73 73 79   BP and HR controlled 12/22   9: Hyperlipidemia:  continue statin   10: Glaucoma: continue Cosopt and Xalatan   11: DM: CBGs QID, A1c = 6.5% (home regimen: insulin glargine 20 units twice daily, NovoLog insulin 7 units 3 times daily with meals)              -continue Novolog 5 units with meals             -continue Semglee 15 units BID  -12/18 dc NovoLog  with meals due to hypoglycemia last night, restart correctional, Decrease semglee to 13u BID  -12/20 CBGs fair control, continue current regimen for now   CBG (last 3)  Recent Labs    11/21/23 2126 11/22/23 0631 11/22/23 1121  GLUCAP 198* 200* 250*   Elevated increase semglee to 14U BID 12/22, appetite has increased yesterday   12: ESRD on HD on M/W/F via RUE AVF             - on Phoslo BID and Rocaltrol M/W/F             -HD at the end of the day to allow participation in therapies  -Reviewed nephrology note, HD today  13: OSA: CPAP  -12/19 reports good results with CPAP last night -12/20 patient reports he did not qualify for CPAP during the study he was doing  14: CHF: echo>>55-60%, grade 1 DD -volume status per nephrology/HD -No signs to indicate fluid overload -Weights appear little down Filed Weights   11/18/23 1445 11/18/23 1843 11/19/23 0500  Weight: 107.6 kg 104.3 kg 104.3 kg      15: Obesity: BMI 34.61  16.  Orthostatic hypotension   -Compression stockings and abdominal binder  -Discussed with nephrology today, HD ordered to be adjusted today, no UF/not pulling any fluids off today    LOS: 5 days A FACE TO FACE EVALUATION WAS PERFORMED  Erick Colace 11/22/2023, 12:56 PM

## 2023-11-23 DIAGNOSIS — I63012 Cerebral infarction due to thrombosis of left vertebral artery: Secondary | ICD-10-CM | POA: Diagnosis not present

## 2023-11-23 LAB — GLUCOSE, CAPILLARY
Glucose-Capillary: 207 mg/dL — ABNORMAL HIGH (ref 70–99)
Glucose-Capillary: 204 mg/dL — ABNORMAL HIGH (ref 70–99)
Glucose-Capillary: 217 mg/dL — ABNORMAL HIGH (ref 70–99)
Glucose-Capillary: 197 mg/dL — ABNORMAL HIGH (ref 70–99)

## 2023-11-23 MED ORDER — INSULIN GLARGINE-YFGN 100 UNIT/ML ~~LOC~~ SOLN
14.0000 [IU] | Freq: Two times a day (BID) | SUBCUTANEOUS | Status: DC
  Administered 2023-11-23 – 2023-11-26 (×6): 14 [IU] via SUBCUTANEOUS
  Filled 2023-11-23 (×7): qty 0.14

## 2023-11-23 MED ORDER — MELATONIN 3 MG PO TABS
3.0000 mg | ORAL_TABLET | Freq: Every day | ORAL | Status: DC
  Administered 2023-11-23 – 2023-11-25 (×3): 3 mg via ORAL
  Filled 2023-11-23 (×3): qty 1

## 2023-11-23 NOTE — Progress Notes (Signed)
Occupational Therapy Session Note  Patient Details  Name: Bobby Sampson MRN: [id number] Date of Birth: 03/01/1961  Today's Date: 11/23/2023 OT Individual Time: 0801-0902 OT Individual Time Calculation (min): 61 min   Today's Date: 11/23/2023 OT Individual Time: 1191-4782 OT Individual Time Calculation (min): 58 min   Short Term Goals: Week 1:  OT Short Term Goal 1 (Week 1): STGs=LTGs due to patient's estimated length of stay.  Skilled Therapeutic Interventions/Progress Updates:   Session 1: Pt greeted sitting EOB receiving morning medications, skilled OT session with focus on BADL retraining, functional transfers, and general safety awareness.   Pain: Pt with no reports of pain, although small wound on 3rd L toe noticed, pt unable to feel skin breakdown, LPN made aware. OT offering intermediate rest breaks and positioning suggestions throughout session to address pain/fatigue and maximize participation/safety in session.   Functional Transfers: Multiple sit<>stands, ambulating transfer into/out of bathroom/walk-in shower with CGA + RW/grab bar. Sit<>stand in shower for pericare with CGA + grab bar with cuing providing for LLE attention prior to standing as limb initially wrapped around TTB leg.   Self Care Tasks: Pt completes full-body bathing at shower-level with CGA for standing peri-care, Min A provided for thorough washing of L-foot due to injury previously mentioned. Setup/supervision for UB dressing, CGA for LB dressing, Min A for donning shoes in the absence of shoe horn.   Pt remained sitting in recliner, direct handoff to PT, 4Ps assessed and immediate needs met. Pt continues to be appropriate for skilled OT intervention to promote further functional independence in ADLs/IADLs.   Session 2: Pt greeted resting in bed for skilled OT session with focus on functional transfers and BUE FMC/HEP.   Pain: Pt with no reports of pain, OT offering intermediate rest breaks and positioning  suggestions throughout session to address potential pain/fatigue and maximize participation/safety in session.   Functional Transfers: Tub/shower transfer completed with CGA + RW in preparation for upcoming discharge. Pt states he has suction-cup grab bars installed at home, pt educated on need for CGA for standing care at home due to not having permanent grab bars, pt receptive.   Therapeutic Exercises/Activities: Pt completes x3 reps of 9-hole peg test as modality not standardized assessment. Pt and OT review series of activities patient can complete at home for continued FMC/dexterity work for [employer] (ie. Flipping cards, writing, etc). Red sponge/tan thera-putty issued for gross/isolated grip strengthening.   Education: Pt and OT review the benefits of urinal use for night-time bladder void, instead of ambulating to bathroom at night (patient with previous fall due to this), somewhat hesistant to education/recommendation, will follow up during caregiver education. Pt does have night-lights in hallway.   Pt remained sitting in recliner with 4Ps assessed and immediate needs met. Pt continues to be appropriate for skilled OT intervention to promote further functional independence in ADLs/IADLs.   Therapy Documentation Precautions:  Precautions Precautions: Fall Precaution Comments: L hemi; residual R-sided hemi, visual deficits at baseline; HD MWF; fluid restriction Restrictions Weight Bearing Restrictions Per Provider Order: No   Therapy/Group: Individual Therapy  Lou Cal, OTR/L, MSOT  11/23/2023, 5:51 AM

## 2023-11-23 NOTE — Progress Notes (Signed)
[facility] Progress Note   Dialysis Orders: MWF - [facility] ([address]) - [facility] team 4hr, 450/800, EDW 94.8kg (under his dry weight and this will need to be adjusted), 2K/2.5Ca, AVF, no heparin - Calcitriol 0.96mcg q HD - Mircera q 4 week - Venofer 100mg  x 10 (in middle of this) - 2.5L UFG max per cardiologist   Assessment/Plan: 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 Was out of window for thrombolytics per notes. 2. ESRD: Usual MWF schedule. Holiday schedule this week, Sunday, Tuesday and Friday.  Tolerated  HD Sunday with 2 L net UF; seen by cardiology with recommendation of no more than 2.5 L max UF each treatment.  Dialysis on Tuesday. 3. HTN/volume:  BP better, no edema on exam. 2.5L UFG max per notes. No UF with HD yesterday.  By weight will need to be adjusted at time of discharge.  They are planning on discharging him go approximately 12/26 4. Anemia: Hgb 12.9, no ESA for now. 5. Secondary hyperparathyroidism:  Ca ok, Phos in goal.  Increased Phoslo dose on 12/18. Continue VDRA. 6. T2DM 7. Hx prior CVA: On statin + plavix 8. HFpEF  Subjective:   Patient seen and examined at bedside.  Tolerated dialysis yesterday.   Denies CP, SOB, abdominal pain and n/v/d.  Knee tends to buckle when it is flexed but when he is standing tends to be more stable.  Objective Vitals:   11/22/23 1828 11/22/23 2042 11/23/23 0500 11/23/23 0538  BP: (!) 145/80 115/70  123/70  Pulse: 77 79  81  Resp: (!) 9 18  18   Temp:  97.9 F (36.6 C)  98 F (36.7 C)  TempSrc:  Oral    SpO2: 100% 100%  96%  Weight:   92.4 kg   Height:       Physical Exam General:WDWN male in NAD Heart:RRR, no mrg Lungs:CTAB, nml WOB on RA Abdomen:soft, NTND Extremities:no LE edema Dialysis Access: RU BBT +b/t   Filed Weights   11/22/23 1402 11/22/23 1823 11/23/23 0500  Weight: 92.8 kg 90.2 kg 92.4 kg    Intake/Output Summary (Last 24 hours) at  11/23/2023 0923 Last data filed at 11/22/2023 1837 Gross per 24 hour  Intake 480 ml  Output 2000 ml  Net -1520 ml    Additional Objective Labs: Basic Metabolic Panel: Recent Labs  Lab 11/18/23 0515 11/22/23 1335  NA 134* 132*  K 3.9 4.9  CL 95* 91*  CO2 25 25  GLUCOSE 166* 275*  BUN 50* 55*  CREATININE 10.21* 10.39*  CALCIUM 8.9 9.4  PHOS 6.6* 5.0*   Liver Function Tests: Recent Labs  Lab 11/18/23 0515 11/22/23 1335  ALBUMIN 3.3* 3.6   CBC: Recent Labs  Lab 11/18/23 0515 11/22/23 1335  WBC 7.2 7.7  HGB 12.2* 12.9*  HCT 37.8* 40.4  MCV 83.1 84.2  PLT 276 240   CBG: Recent Labs  Lab 11/21/23 2126 11/22/23 0631 11/22/23 1121 11/22/23 2102 11/23/23 0537  GLUCAP 198* 200* 250* 224* 207*    Medications:    aspirin  81 mg Oral Daily   atorvastatin  80 mg Oral QHS   buPROPion  150 mg Oral Daily   calcitRIOL  0.5 mcg Oral Q M,W,F-1800   calcium acetate  1,334 mg Oral BID WC   Chlorhexidine Gluconate Cloth  6 each Topical Q0600   Chlorhexidine Gluconate Cloth  6 each Topical Q0600   clopidogrel  75 mg Oral Daily  dorzolamide-timolol  1 drop Both Eyes BID   FLUoxetine  20 mg Oral QHS   heparin  5,000 Units Subcutaneous Q8H   insulin aspart  0-6 Units Subcutaneous TID WC   insulin glargine-yfgn  14 Units Subcutaneous BID   latanoprost  1 drop Left Eye QHS   metoprolol succinate  25 mg Oral Daily   pantoprazole  40 mg Oral QHS

## 2023-11-23 NOTE — Plan of Care (Signed)
  Problem: RH Balance Goal: LTG Patient will maintain dynamic standing with ADLs (OT) Description: LTG:  Patient will maintain dynamic standing balance with assist during activities of daily living (OT)  Flowsheets (Taken 11/23/2023 1238) LTG: Pt will maintain dynamic standing balance during ADLs with: Contact Guard/Touching assist Note: Downgraded due to change in DC date.    Problem: Sit to Stand Goal: LTG:  Patient will perform sit to stand in prep for activites of daily living with assistance level (OT) Description: LTG:  Patient will perform sit to stand in prep for activites of daily living with assistance level (OT) Flowsheets (Taken 11/23/2023 1238) LTG: PT will perform sit to stand in prep for activites of daily living with assistance level: Contact Guard/Touching assist Note: Downgraded due to change in DC date.    Problem: RH Bathing Goal: LTG Patient will bathe all body parts with assist levels (OT) Description: LTG: Patient will bathe all body parts with assist levels (OT) Flowsheets (Taken 11/23/2023 1238) LTG: Pt will perform bathing with assistance level/cueing: Contact Guard/Touching assist Note: Downgraded due to change in DC date.    Problem: RH Dressing Goal: LTG Patient will perform lower body dressing w/assist (OT) Description: LTG: Patient will perform lower body dressing with assist, with/without cues in positioning using equipment (OT) Flowsheets (Taken 11/23/2023 1238) LTG: Pt will perform lower body dressing with assistance level of: Contact Guard/Touching assist Note: Downgraded due to change in DC date.    Problem: RH Toileting Goal: LTG Patient will perform toileting task (3/3 steps) with assistance level (OT) Description: LTG: Patient will perform toileting task (3/3 steps) with assistance level (OT)  Flowsheets (Taken 11/23/2023 1238) LTG: Pt will perform toileting task (3/3 steps) with assistance level: Contact Guard/Touching assist Note: Downgraded  due to change in DC date.    Problem: RH Toilet Transfers Goal: LTG Patient will perform toilet transfers w/assist (OT) Description: LTG: Patient will perform toilet transfers with assist, with/without cues using equipment (OT) Flowsheets (Taken 11/23/2023 1238) LTG: Pt will perform toilet transfers with assistance level of: Contact Guard/Touching assist Note: Downgraded due to change in DC date.    Problem: RH Tub/Shower Transfers Goal: LTG Patient will perform tub/shower transfers w/assist (OT) Description: LTG: Patient will perform tub/shower transfers with assist, with/without cues using equipment (OT) Flowsheets (Taken 11/23/2023 1238) LTG: Pt will perform tub/shower stall transfers with assistance level of: Contact Guard/Touching assist Note: Downgraded due to change in DC date.    Problem: RH Simple Meal Prep Goal: LTG Patient will perform simple meal prep w/assist (OT) Description: LTG: Patient will perform simple meal prep with assistance, with/without cues (OT). Flowsheets (Taken 11/23/2023 1238) LTG: Pt will perform simple meal prep w/level of: Wheelchair level Note: Downgraded due to change in DC date.

## 2023-11-23 NOTE — Progress Notes (Signed)
PROGRESS NOTE   Subjective/Complaints: No new complaints this morning  Feels ready to d/c on Thursday and team feels he will be ready +insomnia  ROS: Patient denies fever, rash, sore throat, blurred vision, dizziness, nausea, vomiting, diarrhea, cough, shortness of breath or chest pain, joint or back/neck pain, headache, or mood change.  + insomnia- continues Numbness-improving gradually   Objective:   No results found. Recent Labs    11/22/23 1335  WBC 7.7  HGB 12.9*  HCT 40.4  PLT 240    Recent Labs    11/22/23 1335  NA 132*  K 4.9  CL 91*  CO2 25  GLUCOSE 275*  BUN 55*  CREATININE 10.39*  CALCIUM 9.4     Intake/Output Summary (Last 24 hours) at 11/23/2023 1158 Last data filed at 11/22/2023 1837 Gross per 24 hour  Intake 480 ml  Output 2000 ml  Net -1520 ml        Physical Exam: Vital Signs Blood pressure 123/70, pulse 81, temperature 98 F (36.7 C), resp. rate 18, height 5\' 9"  (1.753 m), weight 92.4 kg, SpO2 96%.    General: No acute distress Mood and affect are appropriate Heart: Regular rate and rhythm no rubs murmurs or extra sounds Lungs: Clear to auscultation, breathing unlabored, no rales or wheezes Abdomen: Positive bowel sounds, soft nontender to palpation, nondistended Extremities: No clubbing, cyanosis, or edema Skin: No evidence of breakdown, no evidence of rash  Sensory exam normal sensation to light touch and proprioception in bilateral upper and lower extremities Cerebellar exam mild dysmetria LLE>LUE  Musculoskeletal: Full range of motion in all 4 extremities. No joint swelling   MMT: RUE and RLE 5/5 prox to distal. LUE 4+/5 prox to distal  Impaired FTN/FMC LUE.   LLE 4+/5.   Sensation touch sensation intact left upper extremity. No abnl resting tone.   Musculoskeletal:  Missing digits 1 and 2 on the right, stable 12/23     Assessment/Plan: 1. Functional deficits  which require 3+ hours per day of interdisciplinary therapy in a comprehensive inpatient rehab setting. Physiatrist is providing close team supervision and 24 hour management of active medical problems listed below. Physiatrist and rehab team continue to assess barriers to discharge/monitor patient progress toward functional and medical goals  Care Tool:  Bathing    Body parts bathed by patient: Right arm, Left arm, Chest, Abdomen, Front perineal area, Buttocks, Right upper leg, Left upper leg, Right lower leg, Left lower leg, Face         Bathing assist Assist Level: Contact Guard/Touching assist     Upper Body Dressing/Undressing Upper body dressing   What is the patient wearing?: Pull over shirt    Upper body assist Assist Level: Set up assist    Lower Body Dressing/Undressing Lower body dressing      What is the patient wearing?: Pants, Underwear/pull up     Lower body assist Assist for lower body dressing: Contact Guard/Touching assist     Toileting Toileting Toileting Activity did not occur (Clothing management and hygiene only): N/A (no void or bm)  Toileting assist Assist for toileting: Minimal Assistance - Patient > 75%     Transfers Chair/bed  transfer  Transfers assist     Chair/bed transfer assist level: Moderate Assistance - Patient 50 - 74%     Locomotion Ambulation   Ambulation assist      Assist level: Moderate Assistance - Patient 50 - 74% Assistive device: Walker-rolling Max distance: 147   Walk 10 feet activity   Assist     Assist level: Minimal Assistance - Patient > 75% Assistive device: Walker-rolling   Walk 50 feet activity   Assist    Assist level: Minimal Assistance - Patient > 75% Assistive device: Walker-rolling    Walk 150 feet activity   Assist Walk 150 feet activity did not occur: Safety/medical concerns (fatigue)         Walk 10 feet on uneven surface  activity   Assist Walk 10 feet on uneven  surfaces activity did not occur: Safety/medical concerns (fatigue)         Wheelchair     Assist Is the patient using a wheelchair?: Yes Type of Wheelchair: Manual    Wheelchair assist level: Supervision/Verbal cueing Max wheelchair distance: 150    Wheelchair 50 feet with 2 turns activity    Assist        Assist Level: Supervision/Verbal cueing   Wheelchair 150 feet activity     Assist      Assist Level: Supervision/Verbal cueing   Blood pressure 123/70, pulse 81, temperature 98 F (36.7 C), resp. rate 18, height 5\' 9"  (1.753 m), weight 92.4 kg, SpO2 96%.  Medical Problem List and Plan: 1. 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  Antithrombotics: -DVT/anticoagulation:  Pharmaceutical: Heparin             -antiplatelet therapy: Aspirin and Plavix for three weeks followed by Plavix alone   3. Bilateral knee pain: placed order for [employer] to eval current braces (which belong to his wife) and to order similar ones. Tylenol as needed   4. Mood/Behavior/Sleep: LCSW to evaluate and provide emotional support             -continue Wellbutrin XL and Prozac             -antipsychotic agents: n/a   5. Neuropsych/cognition: This patient is capable of making decisions on his own behalf.   6. Skin/Wound Care: Routine skin care checks   7. Fluids/Electrolytes/Nutrition: Strict Is and Os.  Laboratory follow-up per nephrology             -renal diet; 1200 mL fluid restriction   8: Hypertension: monitor TID and prn             -continue Toprol-XL 25 mg daily  -12/20 baseline blood pressures stable, continue current regimen for now    11/23/2023    5:38 AM 11/23/2023    5:00 AM 11/22/2023    8:42 PM  Vitals with BMI  Weight   203 lbs 11 oz   BMI  30.07   Systolic 123  115  Diastolic 70  70  Pulse 81  79   BP and HR controlled 12/22   9: Hyperlipidemia: continue statin   10: Glaucoma: continue Cosopt and Xalatan   11:  DM: CBGs QID, A1c = 6.5% (home regimen: insulin glargine 20 units twice daily, NovoLog insulin 7 units 3 times daily with meals)              -continue Novolog 5 units with meals             -continue Semglee 15 units BID  -12/18 dc NovoLog  with meals due to hypoglycemia last night, restart correctional, Decrease semglee to 13u BID  -12/20 CBGs fair control, continue current regimen for now   CBG (last 3)  Recent Labs    11/22/23 1121 11/22/23 2102 11/23/23 0537  GLUCAP 250* 224* 207*   Increase semglee to 14U  12: ESRD on HD on M/W/F via RUE AVF             - on Phoslo BID and Rocaltrol M/W/F             -HD at the end of the day to allow participation in therapies  -Reviewed nephrology note, HD today  13: OSA: CPAP  -12/19 reports good results with CPAP last night -12/20 patient reports he did not qualify for CPAP during the study he was doing  14: CHF: echo>>55-60%, grade 1 DD -volume status per nephrology/HD -No signs to indicate fluid overload -weights reviewed and stable Filed Weights   11/22/23 1402 11/22/23 1823 11/23/23 0500  Weight: 92.8 kg 90.2 kg 92.4 kg      15: Obesity: BMI 34.61  16.  Orthostatic hypotension   -continueCompression stockings and abdominal binder    LOS: 6 days A FACE TO FACE EVALUATION WAS PERFORMED  Drema Pry Ryzen Deady 11/23/2023, 11:58 AM

## 2023-11-23 NOTE — Progress Notes (Signed)
Contacted by rehab CSW this am regarding pt's d/c from rehab on Thursday. Contacted [facility] and confirmed that pt can resume on Friday. Update provided to CSW. Will assist as needed.    Olivia Canter Renal Navigator [phone]

## 2023-11-23 NOTE — Progress Notes (Signed)
Patient ID: Bobby Sampson, male   DOB: 05/22/1961, 62 y.o.   MRN: [id number]  This SW covering for primary SW, Becky Dupree.   SW received updates from medical team pt desire to leave on 12/26. SW will follow-up once updates from dialysis coordinator.   SW spoke with pt wife to inform being made aware of pt desire to leave on Thursday, however, will need approval from dialysis coordinator confirming the center can accept pt on Friday. SW waiting on follow-up.   0930-SW received updates from [facility] Coord reporting dialysis center can accept pt on Friday.   SW called pt wife to inform on above.   Cecile Sheerer, MSW, LCSW Office: [phone] Cell: [phone] Fax: [phone]

## 2023-11-23 NOTE — Progress Notes (Signed)
Orthopedic Tech Progress Note Patient Details:  Bobby Sampson 09/18/1961 [id number]  Ortho Devices Type of Ortho Device: Knee Sleeve Ortho Device/Splint Location: BLE Ortho Device/Splint Interventions: Ordered, Application   Post Interventions Patient Tolerated: Well Instructions Provided: Adjustment of device  Blaike Newburn A Trang Bouse 11/23/2023, 2:16 PM

## 2023-11-23 NOTE — Plan of Care (Signed)
downgraded goals to CGA 2/2 pt request for shorter length of stay and D/C 12/26.  Problem: RH Balance Goal: LTG Patient will maintain dynamic sitting balance (PT) Description: LTG:  Patient will maintain dynamic sitting balance with assistance during mobility activities (PT) Flowsheets (Taken 11/23/2023 1226) LTG: Pt will maintain dynamic sitting balance during mobility activities with:: (downgraded goals 2/2 pt request for shorter length of stay and D/C 12/26.) Supervision/Verbal cueing Goal: LTG Patient will maintain dynamic standing balance (PT) Description: LTG:  Patient will maintain dynamic standing balance with assistance during mobility activities (PT) Flowsheets (Taken 11/23/2023 1226) LTG: Pt will maintain dynamic standing balance during mobility activities with:: (downgraded goals 2/2 pt request for shorter length of stay and D/C 12/26.) Contact Guard/Touching assist   Problem: Sit to Stand Goal: LTG:  Patient will perform sit to stand with assistance level (PT) Description: LTG:  Patient will perform sit to stand with assistance level (PT) Flowsheets (Taken 11/23/2023 0959) LTG: PT will perform sit to stand in preparation for functional mobility with assistance level: Contact Guard/Touching assist   Problem: RH Bed to Chair Transfers Goal: LTG Patient will perform bed/chair transfers w/assist (PT) Description: LTG: Patient will perform bed to chair transfers with assistance (PT). Flowsheets (Taken 11/23/2023 1226) LTG: Pt will perform Bed to Chair Transfers with assistance level: (downgraded goals 2/2 pt request for shorter length of stay and D/C 12/26.) Contact Guard/Touching assist   Problem: RH Car Transfers Goal: LTG Patient will perform car transfers with assist (PT) Description: LTG: Patient will perform car transfers with assistance (PT). Flowsheets (Taken 11/23/2023 1226) LTG: Pt will perform car transfers with assist:: (downgraded goals 2/2 pt request for shorter  length of stay and D/C 12/26.) Contact Guard/Touching assist   Problem: RH Furniture Transfers Goal: LTG Patient will perform furniture transfers w/assist (OT/PT) Description: LTG: Patient will perform furniture transfers  with assistance (OT/PT). Flowsheets (Taken 11/23/2023 1226) LTG: Pt will perform furniture transfers with assist:: (downgraded goals 2/2 pt request for shorter length of stay and D/C 12/26.) Contact Guard/Touching assist   Problem: RH Ambulation Goal: LTG Patient will ambulate in controlled environment (PT) Description: LTG: Patient will ambulate in a controlled environment, # of feet with assistance (PT). Flowsheets Taken 11/23/2023 1226 LTG: Pt will ambulate in controlled environ  assist needed:: (downgraded goals 2/2 pt request for shorter length of stay and D/C 12/26.) Contact Guard/Touching assist Taken 11/18/2023 1546 LTG: Ambulation distance in controlled environment: 150 feet with LRAD Goal: LTG Patient will ambulate in home environment (PT) Description: LTG: Patient will ambulate in home environment, # of feet with assistance (PT). Flowsheets (Taken 11/23/2023 1226) LTG: Pt will ambulate in home environ  assist needed:: (downgraded goals 2/2 pt request for shorter length of stay and D/C 12/26.) Contact Guard/Touching assist

## 2023-11-23 NOTE — Progress Notes (Signed)
Physical Therapy Session Note  Patient Details  Name: Bobby Sampson MRN: [id number] Date of Birth: May 27, 1961  Today's Date: 11/23/2023 PT Individual Time: 0903-1015 PT Individual Time Calculation (min): 72 min   Short Term Goals: Week 1:  PT Short Term Goal 1 (Week 1): pt will ambulate 150 feet with LRAD and CGA PT Short Term Goal 2 (Week 1): pt will perform sit to stand with LRAD and CGA PT Short Term Goal 3 (Week 1): pt will perform bed to chair transfer with LRAD and CGA  Skilled Therapeutic Interventions/Progress Updates:      Pt seated in recliner upon arrival. Pt agreeable to therapy. Pt denies any pain. MD and dialysis doctor present to clear pt to discharge on 11/26.   Pt wife on phone, reports she brought in her knee braces that pt was using at baseline, pt and wife requesting knee braces specifically for pt, notified MD during morning rounds, and ordered knee brace.  Donned knee brace B with mod A, pt demos and reports improved stability with B knee brace with gait, no evidence of knee buckling.    Pt performed sit to stand, and stand pivot trasnfer with RW and CGA, verbal cues provided for equal weight shift intermittently as pt demos tendency to over shift to L with stand to sit.   Pt ambulated 130 feet with RW and CGA, pt demos improved awareness and proprioception of L LE positioning and able to correct with verbal cues and increased focus, pt demos L LE adduction, knee flexion, during L LE swing with distractions and fatigue.   Pt performed Car transfer with light min A for power up 2/2 lower surface and CGA for pivot with RW.   Ambulated up and down ramp with CGA. Ascended/descended ramp in WC with B UE/LE with supervision. Recommending use of WC for mobility home entry to and from dialysis transport for safety 2/2 fatigue after dialysis. Pt verbalized understanding and agreeable.   Picking up object with RW and mod A. Pt picked up cones x3 CGA with use of RW and reacher  recommending use of reacher for home and bag for front of RW, pt reports he has a Sports administrator at home.   Patient demonstrates increased fall risk as noted by score of   11/56 on Berg Balance Scale.  (<36= high risk for falls, close to 100%; 37-45 significant >80%; 46-51 moderate >50%; 52-55 lower >25%). Education provided for need for RW for all transfers and ambulation for stability and to reduce fall risk, pt verbalized understanding.         Therapy Documentation Precautions:  Precautions Precautions: Fall Precaution Comments: L hemi; residual R-sided hemi, visual deficits at baseline; HD MWF; fluid restriction Restrictions Weight Bearing Restrictions Per Provider Order: No Balance: Balance Balance Assessed: Yes Standardized Balance Assessment Standardized Balance Assessment: Berg Balance Test Berg Balance Test Sit to Stand: Needs minimal aid to stand or to stabilize Standing Unsupported: Able to stand 2 minutes with supervision (wide BOS) Sitting with Back Unsupported but Feet Supported on Floor or Stool: Able to sit safely and securely 2 minutes Stand to Sit: Uses backs of legs against chair to control descent Transfers: Needs one person to assist Standing Unsupported with Eyes Closed: Needs help to keep from falling Standing Ubsupported with Feet Together: Needs help to attain position and unable to hold for 15 seconds From Standing, Reach Forward with Outstretched Arm: Loses balance while trying/requires external support From Standing Position, Pick up Object from Floor:  Unable to try/needs assist to keep balance From Standing Position, Turn to Look Behind Over each Shoulder: Needs assist to keep from losing balance and falling Turn 360 Degrees: Needs assistance while turning Standing Unsupported, Alternately Place Feet on Step/Stool: Needs assistance to keep from falling or unable to try Standing Unsupported, One Foot in Front: Loses balance while stepping or  standing Standing on One Leg: Unable to try or needs assist to prevent fall Total Score: 11 Static Sitting Balance Static Sitting - Balance Support: Feet supported Static Sitting - Level of Assistance: 7: Independent Dynamic Sitting Balance Dynamic Sitting - Balance Support: During functional activity Static Standing Balance Static Standing - Balance Support: Bilateral upper extremity supported;During functional activity Static Standing - Level of Assistance: 5: Stand by assistance (CGA/clsoe supervision) Dynamic Standing Balance Dynamic Standing - Level of Assistance: 5: Stand by assistance;4: Min assist (CGA) Dynamic Standing - Balance Activities: Lateral lean/weight shifting;Forward lean/weight shifting;Reaching for objects  Therapy/Group: Individual Therapy  [facility] [place], [place], DPT  11/23/2023, 9:08 AM

## 2023-11-24 DIAGNOSIS — E1142 Type 2 diabetes mellitus with diabetic polyneuropathy: Secondary | ICD-10-CM

## 2023-11-24 DIAGNOSIS — I63019 Cerebral infarction due to thrombosis of unspecified vertebral artery: Secondary | ICD-10-CM | POA: Diagnosis not present

## 2023-11-24 DIAGNOSIS — N186 End stage renal disease: Secondary | ICD-10-CM | POA: Diagnosis not present

## 2023-11-24 LAB — CBC
HCT: 38.8 % — ABNORMAL LOW (ref 39.0–52.0)
Hemoglobin: 12.6 g/dL — ABNORMAL LOW (ref 13.0–17.0)
MCH: 26.9 pg (ref 26.0–34.0)
MCHC: 32.5 g/dL (ref 30.0–36.0)
MCV: 82.7 fL (ref 80.0–100.0)
Platelets: 254 10*3/uL (ref 150–400)
RBC: 4.69 MIL/uL (ref 4.22–5.81)
RDW: 15.5 % (ref 11.5–15.5)
WBC: 7.7 10*3/uL (ref 4.0–10.5)
nRBC: 0 % (ref 0.0–0.2)

## 2023-11-24 LAB — GLUCOSE, CAPILLARY
Glucose-Capillary: 122 mg/dL — ABNORMAL HIGH (ref 70–99)
Glucose-Capillary: 254 mg/dL — ABNORMAL HIGH (ref 70–99)
Glucose-Capillary: 157 mg/dL — ABNORMAL HIGH (ref 70–99)
Glucose-Capillary: 243 mg/dL — ABNORMAL HIGH (ref 70–99)

## 2023-11-24 LAB — RENAL FUNCTION PANEL
Albumin: 3.6 g/dL (ref 3.5–5.0)
Anion gap: 17 — ABNORMAL HIGH (ref 5–15)
BUN: 59 mg/dL — ABNORMAL HIGH (ref 8–23)
CO2: 24 mmol/L (ref 22–32)
Calcium: 9.8 mg/dL (ref 8.9–10.3)
Chloride: 89 mmol/L — ABNORMAL LOW (ref 98–111)
Creatinine, Ser: 10.58 mg/dL — ABNORMAL HIGH (ref 0.61–1.24)
GFR, Estimated: 5 mL/min — ABNORMAL LOW (ref >60)
Glucose, Bld: 256 mg/dL — ABNORMAL HIGH (ref 70–99)
Phosphorus: 5.5 mg/dL — ABNORMAL HIGH (ref 2.5–4.6)
Potassium: 5.3 mmol/L — ABNORMAL HIGH (ref 3.5–5.1)
Sodium: 130 mmol/L — ABNORMAL LOW (ref 135–145)

## 2023-11-24 LAB — HEPATITIS B SURFACE ANTIBODY, QUANTITATIVE: Hep B S AB Quant (Post): 1341 m[IU]/mL

## 2023-11-24 MED ORDER — CLOPIDOGREL BISULFATE 75 MG PO TABS
75.0000 mg | ORAL_TABLET | Freq: Every day | ORAL | 0 refills | Status: AC
  Filled 2023-11-24: qty 30, 30d supply, fill #0

## 2023-11-24 MED ORDER — CALCITRIOL 0.5 MCG PO CAPS
0.5000 ug | ORAL_CAPSULE | ORAL | 0 refills | Status: AC
  Filled 2023-11-24: qty 12, 28d supply, fill #0

## 2023-11-24 MED ORDER — ASPIRIN 81 MG PO CHEW
81.0000 mg | CHEWABLE_TABLET | Freq: Every day | ORAL | 0 refills | Status: AC
  Filled 2023-11-24: qty 10, 10d supply, fill #0

## 2023-11-24 MED ORDER — METOPROLOL SUCCINATE ER 25 MG PO TB24
25.0000 mg | ORAL_TABLET | Freq: Every day | ORAL | 0 refills | Status: AC
  Filled 2023-11-24: qty 30, 30d supply, fill #0

## 2023-11-24 NOTE — Progress Notes (Signed)
Patient ID: Bobby Sampson, male   DOB: 1961/07/15, 62 y.o.   MRN: [id number]  This SW covering for primary, SW Becky Dupree.   Per OT, pt wife reports pt does not have TTB and would like one.   SW ordered TTB with [employer] via parachute.   Cecile Sheerer, MSW, LCSW Office: [phone] Cell: [phone] Fax: [phone]

## 2023-11-24 NOTE — Evaluation (Deleted)
Speech Language Pathology Assessment and Plan  Patient Details  Name: Bobby Sampson MRN: [id number] Date of Birth: 05-Feb-1961  SLP Diagnosis: Dysphagia  Rehab Potential: Good ELOS: 12/26   Today's Date: 11/24/2023 SLP Individual Time: 4098-1191 SLP Individual Time Calculation (min): 30 min  Hospital Problem: Principal Problem:   Stroke-like symptoms Active Problems:   Type 2 diabetes mellitus treated with insulin (HCC)  Past Medical History:  Past Medical History:  Diagnosis Date   Diabetes mellitus without complication (HCC)    Hypertension    Neuropathy    Renal disorder    Stroke (HCC)    2016   Past Surgical History:  Past Surgical History:  Procedure Laterality Date   INTRAMEDULLARY (IM) NAIL INTERTROCHANTERIC Right 11/02/2021   Procedure: INTRAMEDULLARY (IM) NAIL INTERTROCHANTRIC;  Surgeon: Juanell Fairly, MD;  Location: [facility] ORS;  Service: Orthopedics;  Laterality: Right;   TONSILLECTOMY      Assessment / Plan / Recommendation Clinical Impression  Bobby Sampson is a 62 year old male who awoke on the morning of 11/15/2023 with left-sided numbness and weakness which is different from his baseline. He was transported to [facility] emergency department via [facility] who initiated code stroke. 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 Code stroke CT head with no acute process, Aspects 10. 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 MRI revealed acute infarct in the right dorsal pons. Permissive hypertension allowed. Loaded with aspirin and Plavix now on aspirin 81 mg daily and Plavix 75 mg daily for 3 weeks followed by Plavix alone. The patient requires inpatient medicine and rehabilitation evaluations and services for ongoing dysfunction secondary to right dorsal pontine infarct.    Patient seen in am for family education session to address cognition however; during education, pt's wife reported chronic dysphagia and s/s aspiration with thin liquids. Pt's wife further reported coughing til the point of vomiting in some instances. Pt was seated at EOB and presented with thin liquids via straw. He consumed thin liquid with wet vocal quality after swallow of single sips in 1 of 2 trials. In trial of consecutive sips pt presented with immediate coughing after swallow requiring additional time for recovery. SLP recommending continue regular solids and thin liquids with thorough education completed on positioning recommendations, oral hygiene BID, and small single sips of liquid at a time. Pt .would also benefit from an instrumental assessment prior to discharge in order to determine safest least restrictive diet.   Skilled Therapeutic Interventions          BSE and informal assessment measures administered. Please see full report for additional details.     SLP Assessment  Patient will need skilled Speech Lanaguage Pathology Services during CIR admission    Recommendations  SLP Diet Recommendations: Age appropriate regular solids;Thin Liquid Administration via: Cup;Straw Medication Administration: Whole meds with liquid Supervision: Patient able to self feed Compensations: Minimize environmental distractions;Slow rate;Small sips/bites Postural Changes and/or Swallow Maneuvers: Seated upright 90 degrees;Upright 30-60 min after meal Oral Care Recommendations: Oral care BID Patient destination: Home Follow up Recommendations: Home Health SLP Equipment Recommended: None recommended by SLP    SLP Frequency 3 to 5 out of 7 days   SLP Duration  SLP Intensity  SLP Treatment/Interventions 12/26  Minumum of 1-2 x/day, 30 to 90 minutes  Cognitive remediation/compensation;Patient/family education;Therapeutic Activities;Dysphagia/aspiration precaution training  Pain  No/  denies pain   Oral Motor Motor Speech Overall Motor Speech: Appears within functional limits for tasks assessed Respiration: Within functional limits Phonation: Normal Resonance: Within functional limits Articulation: Within functional limitis Intelligibility: Intelligible Motor Speech Errors: Not applicable  Care Tool Care Tool Cognition Ability to hear (with hearing aid or hearing appliances if normally used Ability to hear (with hearing aid or hearing appliances if normally used): 0. Adequate - no difficulty in normal conservation, social interaction, listening to TV   Expression of Ideas and Wants Expression of Ideas and Wants: 4. Without difficulty (complex and basic) - expresses complex messages without difficulty and with speech that is clear and easy to understand   Understanding Verbal and Non-Verbal Content Understanding Verbal and Non-Verbal Content: 4. Understands (complex and basic) - clear comprehension without cues or repetitions  Memory/Recall Ability Memory/Recall Ability : Current season;That he or she is in a hospital/hospital unit    Bedside Swallowing Assessment General Respiratory Status: Room air History of Recent Intubation: No Behavior/Cognition: Alert Self-Feeding Abilities: Able to feed self Patient Positioning: Upright in bed Baseline Vocal Quality: Normal Volitional Cough: Strong Volitional Swallow: Able to elicit  Oral Care Assessment Oral Assessment  (WDL): Within Defined Limits Lips: Symmetrical Teeth: Missing (Comment) Tongue: Pink;Moist Mucous Membrane(s): Moist;Pink Saliva: Moist, saliva free flowing Level of Consciousness: Alert Is patient on any of following O2 devices?: None of the above Nutritional status: Dysphagia Oral Assessment Risk : High Risk Ice Chips Ice chips: Not tested Thin Liquid Thin Liquid: Impaired Presentation: Straw Pharyngeal  Phase Impairments: Cough - Immediate;Wet Vocal Quality Nectar Thick Nectar Thick  Liquid: Not tested Honey Thick Honey Thick Liquid: Not tested Puree Puree: Not tested Solid Solid: Not tested BSE Assessment Suspected Esophageal Findings Suspected Esophageal Findings: Belching;Globus sensation Risk for Aspiration Impact on safety and function: Moderate aspiration risk Other Related Risk Factors: Previous CVA  Short Term Goals: Week 1: SLP Short Term Goal 4 (Week 1): Pt will complete an instrumental swallow assessment to objectively assess pt's oropharyngeal swallow function  Refer to Care Plan for Long Term Goals  Recommendations for other services: None   Discharge Criteria: Patient will be discharged from SLP if patient refuses treatment 3 consecutive times without medical reason, if treatment goals not met, if there is a change in medical status, if patient makes no progress towards goals or if patient is discharged from hospital.  The above assessment, treatment plan, treatment alternatives and goals were discussed and mutually agreed upon: by patient and by family  Renaee Munda 11/24/2023, 5:27 PM

## 2023-11-24 NOTE — Plan of Care (Deleted)
  Problem: RH Swallowing Goal: LTG Pt will demonstrate functional change in swallow as evidenced by bedside/clinical objective assessment (SLP) Description: LTG: Patient will demonstrate functional change in swallow as evidenced by bedside/clinical objective assessment (SLP) Flowsheets (Taken 11/24/2023 1728) LTG: Patient will demonstrate functional change in swallow as evidenced by bedside/clinical objective assessment: Oropharyngeal swallow

## 2023-11-24 NOTE — Progress Notes (Addendum)
Occupational Therapy Discharge Summary  Patient Details  Name: Bobby Sampson MRN: [id number] Date of Birth: 12/22/1960  Date of Discharge from OT service:November 24, 2023   Patient has met 11 of 11 long term goals due to improved activity tolerance, improved balance, postural control, ability to compensate for deficits, functional use of  LEFT upper and LEFT lower extremity, and improved awareness.  Patient to discharge at overall  CGA-Min A  level. Patient's care partner is independent to provide the necessary physical and cognitive assistance at discharge.    Reasons goals not met: N/A  Recommendation:  Patient will benefit from ongoing skilled OT services in home health setting to continue to advance functional skills in the area of BADL and Reduce care partner burden.  Equipment: TTB  Reasons for discharge:  Pt requesting early discharge due to holidays and personal events. Caregiver educated and all   Patient/family agrees with progress made and goals achieved: Yes  OT Discharge Precautions/Restrictions  Precautions Precautions: Fall Precaution Comments: L hemi; residual R-sided hemi, visual deficits at baseline; HD MWF; fluid restriction Required Braces or Orthoses: Knee Immobilizer - Right;Knee Immobilizer - Left Knee Immobilizer - Right: On when out of bed or walking;Other (comment) (For patient comfort.) Knee Immobilizer - Left: On when out of bed or walking;Other (comment) (For patient comfort.) Restrictions Weight Bearing Restrictions Per Provider Order: No ADL ADL Eating: Modified independent Where Assessed-Eating: Edge of bed, Chair Grooming: Modified independent Where Assessed-Grooming: Sitting at sink Upper Body Bathing: Supervision/safety Where Assessed-Upper Body Bathing: Shower Lower Body Bathing: Contact guard Where Assessed-Lower Body Bathing: Shower Upper Body Dressing: Supervision/safety Where Assessed-Upper Body Dressing: Edge of bed,  Chair Lower Body Dressing: Contact guard Where Assessed-Lower Body Dressing: Edge of bed, Chair Toileting: Contact guard Where Assessed-Toileting: Toilet, Bedside Commode Toilet Transfer: Furniture conservator/restorer Method: Proofreader: Engineer, site Method: Ship broker: Insurance underwriter: Administrator, arts Method: Designer, industrial/product: Sales promotion account executive Baseline Vision/History: 5 Retinopathy;4 Cataracts Patient Visual Report: No change from baseline Vision Assessment?: Yes Eye Alignment: Within Functional Limits Ocular Range of Motion: Within Functional Limits Tracking/Visual Pursuits: Able to track stimulus in all quads without difficulty Saccades: Within functional limits Convergence: Within functional limits Perception  Perception: Within Functional Limits Praxis Praxis: Impaired Praxis Impairment Details: Motor planning;Limb apraxia Cognition Cognition Overall Cognitive Status: Impaired/Different from baseline Arousal/Alertness: Awake/alert Orientation Level: Person;Place;Situation Memory: Impaired Memory Impairment: Decreased recall of new information;Decreased short term memory Decreased Short Term Memory: Functional complex Awareness: Impaired Awareness Impairment: Emergent impairment Problem Solving: Impaired Problem Solving Impairment: Functional complex Safety/Judgment: Impaired Comments: Slight impulsivity Brief Interview for Mental Status (BIMS) Repetition of Three Words (First Attempt): 3 Temporal Orientation: Year: Correct Temporal Orientation: Month: Accurate within 5 days Temporal Orientation: Day: Correct Recall: "Sock": Yes, no cue required Recall: "Blue": Yes, no cue required Recall: "Bed": Yes, no cue required BIMS Summary Score: 15 Sensation Sensation Light Touch: Impaired  by gross assessment Peripheral sensation comments: Numbness/tingling in LUE (since onset of CVA-pt reports it is improving); pt able to detect light touch in L LE but reports Numbness/tingling in LLE--unable to detect light touch in B LE 2/2 neuropathy, pt denies any N&T R LE.  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 Light Touch Impaired Details: Impaired LUE;Impaired LLE Hot/Cold: Appears Intact Proprioception: Impaired by gross assessment (Decreased LLE) Stereognosis: Impaired by gross assessment (BUE  nueropathy) Coordination Gross Motor Movements are Fluid and Coordinated: No Fine Motor Movements are Fluid and Coordinated: No Coordination and Movement Description: Deficits due to hemiparesis-acute L hemi; decreased proprioception and steppage gait L LE. Motor  Motor Motor: Hemiplegia;Ataxia Motor - Discharge Observations: Acutely L hemi, previous R hemi, pt reports intermittent R LE buckling causing 2 falls at home. Mobility  Bed Mobility Bed Mobility: Rolling Right;Rolling Left;Supine to Sit;Sit to Supine Rolling Right: Independent with assistive device Rolling Left: Independent with assistive device Supine to Sit: Independent with assistive device Sit to Supine: Independent with assistive device Transfers Sit to Stand: Contact Guard/Touching assist Stand to Sit: Contact Guard/Touching assist  Trunk/Postural Assessment  Cervical Assessment Cervical Assessment: Within Functional Limits Thoracic Assessment Thoracic Assessment: Within Functional Limits Lumbar Assessment Lumbar Assessment: Within Functional Limits Postural Control Postural Control: Deficits on evaluation Righting Reactions: Delayed Protective Responses: Delayed  Balance Balance Balance Assessed: Yes Standardized Balance Assessment Standardized Balance Assessment: Berg Balance Test Berg Balance Test Sit to Stand: Needs minimal aid to stand or to stabilize Standing  Unsupported: Able to stand 2 minutes with supervision (wide BOS) Sitting with Back Unsupported but Feet Supported on Floor or Stool: Able to sit safely and securely 2 minutes Stand to Sit: Uses backs of legs against chair to control descent Transfers: Needs one person to assist Standing Unsupported with Eyes Closed: Needs help to keep from falling Standing Ubsupported with Feet Together: Needs help to attain position and unable to hold for 15 seconds From Standing, Reach Forward with Outstretched Arm: Loses balance while trying/requires external support From Standing Position, Pick up Object from Floor: Unable to try/needs assist to keep balance From Standing Position, Turn to Look Behind Over each Shoulder: Needs assist to keep from losing balance and falling Turn 360 Degrees: Needs assistance while turning Standing Unsupported, Alternately Place Feet on Step/Stool: Needs assistance to keep from falling or unable to try Standing Unsupported, One Foot in Front: Loses balance while stepping or standing Standing on One Leg: Unable to try or needs assist to prevent fall Total Score: 11 Static Sitting Balance Static Sitting - Balance Support: Feet supported Static Sitting - Level of Assistance: 7: Independent Dynamic Sitting Balance Dynamic Sitting - Balance Support: During functional activity Dynamic Sitting - Level of Assistance: 5: Stand by assistance (Supervision) Dynamic Sitting - Balance Activities: Lateral lean/weight shifting;Forward lean/weight shifting;Reaching for objects Static Standing Balance Static Standing - Balance Support: Bilateral upper extremity supported;During functional activity Static Standing - Level of Assistance: 5: Stand by assistance (CGA-close supervision) Dynamic Standing Balance Dynamic Standing - Balance Support: During functional activity;Bilateral upper extremity supported Dynamic Standing - Level of Assistance: 5: Stand by assistance (CGA-Min A (higher-level  ambulation)) Dynamic Standing - Balance Activities: Lateral lean/weight shifting;Forward lean/weight shifting;Reaching for objects Extremity/Trunk Assessment RUE Assessment RUE Assessment: Exceptions to [facility] Active Range of Motion (AROM) Comments: WFL General Strength Comments: Grossly WFL; decreased FMC LUE Assessment LUE Assessment: Exceptions to [facility] Active Range of Motion (AROM) Comments: WFL General Strength Comments: Min dysmetria LUE Body System: Neuro Brunstrum levels for arm and hand: Arm;Hand Brunstrum level for arm: Stage V Relative Independence from Synergy Brunstrum level for hand: Stage VI Isolated joint movements   Lou Cal, OTR/L, MSOT  11/24/2023, 7:58 AM

## 2023-11-24 NOTE — Plan of Care (Signed)
  Problem: RH Ambulation Goal: LTG Patient will ambulate in controlled environment (PT) Description: LTG: Patient will ambulate in a controlled environment, # of feet with assistance (PT). Outcome: Adequate for Discharge   Problem: RH Balance Goal: LTG Patient will maintain dynamic sitting balance (PT) Description: LTG:  Patient will maintain dynamic sitting balance with assistance during mobility activities (PT) Outcome: Completed/Met Goal: LTG Patient will maintain dynamic standing balance (PT) Description: LTG:  Patient will maintain dynamic standing balance with assistance during mobility activities (PT) Outcome: Completed/Met   Problem: Sit to Stand Goal: LTG:  Patient will perform sit to stand with assistance level (PT) Description: LTG:  Patient will perform sit to stand with assistance level (PT) Outcome: Completed/Met   Problem: RH Bed Mobility Goal: LTG Patient will perform bed mobility with assist (PT) Description: LTG: Patient will perform bed mobility with assistance, with/without cues (PT). Outcome: Completed/Met   Problem: RH Bed to Chair Transfers Goal: LTG Patient will perform bed/chair transfers w/assist (PT) Description: LTG: Patient will perform bed to chair transfers with assistance (PT). Outcome: Completed/Met   Problem: RH Car Transfers Goal: LTG Patient will perform car transfers with assist (PT) Description: LTG: Patient will perform car transfers with assistance (PT). Outcome: Completed/Met   Problem: RH Furniture Transfers Goal: LTG Patient will perform furniture transfers w/assist (OT/PT) Description: LTG: Patient will perform furniture transfers  with assistance (OT/PT). Outcome: Completed/Met   Problem: RH Ambulation Goal: LTG Patient will ambulate in home environment (PT) Description: LTG: Patient will ambulate in home environment, # of feet with assistance (PT). Outcome: Completed/Met   Problem: RH Wheelchair Mobility Goal: LTG Patient will  propel w/c in controlled environment (PT) Description: LTG: Patient will propel wheelchair in controlled environment, # of feet with assist (PT) Outcome: Completed/Met

## 2023-11-24 NOTE — Plan of Care (Signed)
  Problem: RH Balance Goal: LTG Patient will maintain dynamic standing with ADLs (OT) Description: LTG:  Patient will maintain dynamic standing balance with assist during activities of daily living (OT)  Outcome: Completed/Met   Problem: Sit to Stand Goal: LTG:  Patient will perform sit to stand in prep for activites of daily living with assistance level (OT) Description: LTG:  Patient will perform sit to stand in prep for activites of daily living with assistance level (OT) Outcome: Completed/Met   Problem: RH Bathing Goal: LTG Patient will bathe all body parts with assist levels (OT) Description: LTG: Patient will bathe all body parts with assist levels (OT) Outcome: Completed/Met   Problem: RH Dressing Goal: LTG Patient will perform lower body dressing w/assist (OT) Description: LTG: Patient will perform lower body dressing with assist, with/without cues in positioning using equipment (OT) Outcome: Completed/Met   Problem: RH Toileting Goal: LTG Patient will perform toileting task (3/3 steps) with assistance level (OT) Description: LTG: Patient will perform toileting task (3/3 steps) with assistance level (OT)  Outcome: Completed/Met   Problem: RH Functional Use of Upper Extremity Goal: LTG Patient will use RT/LT upper extremity as a (OT) Description: LTG: Patient will use right/left upper extremity as a stabilizer/gross assist/diminished/nondominant/dominant level with assist, with/without cues during functional activity (OT) Outcome: Completed/Met   Problem: RH Toilet Transfers Goal: LTG Patient will perform toilet transfers w/assist (OT) Description: LTG: Patient will perform toilet transfers with assist, with/without cues using equipment (OT) Outcome: Completed/Met   Problem: RH Tub/Shower Transfers Goal: LTG Patient will perform tub/shower transfers w/assist (OT) Description: LTG: Patient will perform tub/shower transfers with assist, with/without cues using equipment  (OT) Outcome: Completed/Met   Problem: RH Balance Goal: LTG Patient will maintain dynamic standing with ADLs (OT) Description: LTG:  Patient will maintain dynamic standing balance with assist during activities of daily living (OT)  Outcome: Completed/Met   Problem: RH Bathing Goal: LTG Patient will bathe all body parts with assist levels (OT) Description: LTG: Patient will bathe all body parts with assist levels (OT) Outcome: Completed/Met   Problem: RH Simple Meal Prep Goal: LTG Patient will perform simple meal prep w/assist (OT) Description: LTG: Patient will perform simple meal prep with assistance, with/without cues (OT). Outcome: Completed/Met

## 2023-11-24 NOTE — Procedures (Signed)
HD Note:  Some information was entered later than the data was gathered due to patient care needs. The stated time with the data is accurate.  Received patient in bed to unit.   Alert and oriented.   Informed consent signed and in chart.   Access used: Upper and inner right arm fistula.   Access issues: None  Patient tolerated treatment well.   TX duration: 3 hours  Alert, without acute distress.  Total UF removed: 3000 ml  Hand-off given to patient's nurse.   Transported back to the room   Tyasia Packard L. Dareen Piano, RN [facility].

## 2023-11-24 NOTE — Progress Notes (Addendum)
Occupational Therapy Session Note  Patient Details  Name: Bobby Sampson MRN: [id number] Date of Birth: 01-27-1961  Today's Date: 11/24/2023 OT Individual Time: 5621-3086 OT Individual Time Calculation (min): 41 min   Today's Date: 11/24/2023 OT Individual Time: 1130-1204 OT Individual Time Calculation (min): 34 min  and Today's Date: 11/24/2023 OT Missed Time: 30 Minutes Missed Time Reason: Other (comment) (Missed minutes due to delay in care.)  Short Term Goals: Week 1:  OT Short Term Goal 1 (Week 1): STGs=LTGs due to patient's estimated length of stay.  Skilled Therapeutic Interventions/Progress Updates:   Session 1: Pt greeted resting in bed for skilled OT session with focus on BADL retraining and safety awareness.   Pain: Pt with no reports of pain, endorsing "stiffness from bed." OT offering intermediate rest breaks and positioning suggestions throughout session to address pain/fatigue and maximize participation/safety in session.   Functional Transfers: STS and room-level ambulating transfers with CGA + RW, cuing provided for LLE attention prior to mobility.   Self Care Tasks: Pt requires Min A for donning tennis shoes, dependent for TEDs. Pt threads LB garments with supervision, use of figure-4 technique, CGA + RW for hiking in standing. Pt Max A for donning bilateral knee braces.   Seated sink-side care with Mod I.   Pt remained sitting in recliner with 4Ps assessed and immediate needs met. Pt continues to be appropriate for skilled OT intervention to promote further functional independence in ADLs/IADLs.   Session 2: Pt greeted sitting EOB for skilled OT session with focus on caregiver education.   Pain: Pt with no reports of pain, OT offering intermediate rest breaks and positioning suggestions throughout session to address pain/fatigue and maximize participation/safety in session.   Pt's spouse present for caregiver education with focus on OT role, OT POC, and current  patient functioning including emphasis placed on decreased LLE proprioception and safety awareness, as well as energy conservation techniques. Bed mobility, functional transfers, and LB dressing/bathing AE reviewed. Pt's caregivers compete hands-on training for functional transfers, including simulated tub/shower. Spouse prefers to defer showers at tub level until HHOT, due to decreased LLE sensation. All questions answered at end of session.    Pt remained sitting EOB, spouse present, 4Ps assessed and immediate needs met. Pt continues to be appropriate for skilled OT intervention to promote further functional independence in ADLs/IADLs.   Therapy Documentation Precautions:  Precautions Precautions: Fall Precaution Comments: L hemi; residual R-sided hemi, visual deficits at baseline; HD MWF; fluid restriction Restrictions Weight Bearing Restrictions Per Provider Order: No   Therapy/Group: Individual Therapy  Lou Cal, OTR/L, MSOT  11/24/2023, 6:04 AM

## 2023-11-24 NOTE — Progress Notes (Signed)
PROGRESS NOTE   Subjective/Complaints:  No issues overnite , feels like he has "a piece of lettuce at the back of the throat"   No pain with swallow , not on swallow precautions    ROS: Patient denies CP, SOB, N/V/D  + insomnia- continues Numbness-improving gradually   Objective:   No results found. Recent Labs    11/22/23 1335  WBC 7.7  HGB 12.9*  HCT 40.4  PLT 240    Recent Labs    11/22/23 1335  NA 132*  K 4.9  CL 91*  CO2 25  GLUCOSE 275*  BUN 55*  CREATININE 10.39*  CALCIUM 9.4     Intake/Output Summary (Last 24 hours) at 11/24/2023 1660 Last data filed at 11/23/2023 1824 Gross per 24 hour  Intake 474 ml  Output --  Net 474 ml        Physical Exam: Vital Signs Blood pressure 118/75, pulse 70, temperature 98.5 F (36.9 C), resp. rate 16, height 5\' 9"  (1.753 m), weight 92.6 kg, SpO2 95%.   ENT:  Throat is clear , no adenopathy  General: No acute distress Mood and affect are appropriate Heart: Regular rate and rhythm no rubs murmurs or extra sounds Lungs: Clear to auscultation, breathing unlabored, no rales or wheezes Abdomen: Positive bowel sounds, soft nontender to palpation, nondistended Extremities: No clubbing, cyanosis, or edema Skin: No evidence of breakdown, no evidence of rash Neuro:  stable 12/23 Sensory exam normal sensation to light touch and proprioception in bilateral upper and lower extremities Cerebellar exam mild dysmetria LLE>LUE  Musculoskeletal: Full range of motion in all 4 extremities. No joint swelling   MMT: RUE and RLE 5/5 prox to distal. LUE 4+/5 prox to distal  Impaired FTN/FMC LUE.   LLE 4+/5.   Sensation touch sensation intact left upper extremity. No abnl resting tone.   Musculoskeletal:  Missing digits 1 and 2 on the right,      Assessment/Plan: 1. Functional deficits which require 3+ hours per day of interdisciplinary therapy in a comprehensive  inpatient rehab setting. Physiatrist is providing close team supervision and 24 hour management of active medical problems listed below. Physiatrist and rehab team continue to assess barriers to discharge/monitor patient progress toward functional and medical goals  Care Tool:  Bathing    Body parts bathed by patient: Right arm, Left arm, Chest, Abdomen, Front perineal area, Buttocks, Right upper leg, Left upper leg, Right lower leg, Left lower leg, Face         Bathing assist Assist Level: Contact Guard/Touching assist     Upper Body Dressing/Undressing Upper body dressing   What is the patient wearing?: Pull over shirt    Upper body assist Assist Level: Set up assist    Lower Body Dressing/Undressing Lower body dressing      What is the patient wearing?: Pants, Underwear/pull up     Lower body assist Assist for lower body dressing: Contact Guard/Touching assist     Toileting Toileting Toileting Activity did not occur (Clothing management and hygiene only): N/A (no void or bm)  Toileting assist Assist for toileting: Minimal Assistance - Patient > 75%     Transfers Chair/bed transfer  Transfers assist     Chair/bed transfer assist level: Moderate Assistance - Patient 50 - 74%     Locomotion Ambulation   Ambulation assist      Assist level: Moderate Assistance - Patient 50 - 74% Assistive device: Walker-rolling Max distance: 147   Walk 10 feet activity   Assist     Assist level: Minimal Assistance - Patient > 75% Assistive device: Walker-rolling   Walk 50 feet activity   Assist    Assist level: Minimal Assistance - Patient > 75% Assistive device: Walker-rolling    Walk 150 feet activity   Assist Walk 150 feet activity did not occur: Safety/medical concerns (fatigue)         Walk 10 feet on uneven surface  activity   Assist Walk 10 feet on uneven surfaces activity did not occur: Safety/medical concerns (fatigue)          Wheelchair     Assist Is the patient using a wheelchair?: Yes Type of Wheelchair: Manual    Wheelchair assist level: Supervision/Verbal cueing Max wheelchair distance: 150    Wheelchair 50 feet with 2 turns activity    Assist        Assist Level: Supervision/Verbal cueing   Wheelchair 150 feet activity     Assist      Assist Level: Supervision/Verbal cueing   Blood pressure 118/75, pulse 70, temperature 98.5 F (36.9 C), resp. rate 16, height 5\' 9"  (1.753 m), weight 92.6 kg, SpO2 95%.  Medical Problem List and Plan: 1. 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  Antithrombotics: -DVT/anticoagulation:  Pharmaceutical: Heparin             -antiplatelet therapy: Aspirin and Plavix for three weeks followed by Plavix alone   3. Bilateral knee pain: placed order for [employer] to eval current braces (which belong to his wife) and to order similar ones. Tylenol as needed   4. Mood/Behavior/Sleep: LCSW to evaluate and provide emotional support             -continue Wellbutrin XL and Prozac             -antipsychotic agents: n/a   5. Neuropsych/cognition: This patient is capable of making decisions on his own behalf.   6. Skin/Wound Care: Routine skin care checks   7. Fluids/Electrolytes/Nutrition: Strict Is and Os.  Laboratory follow-up per nephrology             -renal diet; 1200 mL fluid restriction   8: Hypertension: monitor TID and prn             -continue Toprol-XL 25 mg daily  -12/20 baseline blood pressures stable, continue current regimen for now    11/24/2023    5:22 AM 11/24/2023    5:00 AM 11/23/2023    7:42 PM  Vitals with BMI  Weight  204 lbs 4  oz   BMI  30.15   Systolic 118  96  Diastolic 75  78  Pulse 70  79   BP and HR controlled 12/24   9: Hyperlipidemia: continue statin  10: Glaucoma: continue Cosopt and Xalatan   11: DM: CBGs QID, A1c = 6.5% (home regimen: insulin glargine 20 units twice daily, NovoLog insulin 7 units 3 times daily with meals)              -continue Novolog 5 units with meals             -continue Semglee 15 units BID  -12/18 dc NovoLog  with meals due to hypoglycemia last night, restart correctional, Decrease semglee to 13u BID  -12/20 CBGs fair control, continue current regimen for now   CBG (last 3)  Recent Labs    11/23/23 1715 11/23/23 2116 11/24/23 0549  GLUCAP 217* 197* 122*   Increase semglee to 14U, am CBG controlled   12: ESRD on HD on M/W/F via RUE AVF             - on Phoslo BID and Rocaltrol M/W/F             -HD at the end of the day to allow participation in therapies  -Reviewed nephrology note, HD today  13: OSA: CPAP  -12/19 reports good results with CPAP last night -12/20 patient reports he did not qualify for CPAP during the study he was doing  14: CHF: echo>>55-60%, grade 1 DD -volume status per nephrology/HD -No signs to indicate fluid overload -weights reviewed and stable Filed Weights   11/22/23 1823 11/23/23 0500 11/24/23 0500  Weight: 90.2 kg 92.4 kg 92.6 kg      15: Obesity: BMI 34.61  16.  Orthostatic hypotension   -continueCompression stockings and abdominal binder    LOS: 7 days A FACE TO FACE EVALUATION WAS PERFORMED  Erick Colace 11/24/2023, 8:08 AM

## 2023-11-24 NOTE — Progress Notes (Signed)
Nursing education done. RN went over their book and reminded to bring it home. Discuss what to expect on discharge day. Discuss medications re stroke. Patient asked more info re: dialysis medications and diet. Patient requested hand outs. RN placed on exit care.

## 2023-11-24 NOTE — Progress Notes (Signed)
Physical Therapy Discharge Summary  Patient Details  Name: Bobby Sampson MRN: [id number] Date of Birth: 28-Mar-1961  Date of Discharge from PT service:November 24, 2023  Today's Date: 11/24/2023 PT Individual Time: 0903-1000 PT Individual Time Calculation (min): 57 min    Patient has met 9 of 10 long term goals due to improved activity tolerance, improved balance, improved postural control, increased strength, decreased pain, ability to compensate for deficits, improved attention, improved awareness, and improved coordination.  Patient to discharge at an ambulatory level  CGA/min A, with use of WC for community .   Patient's care partner is independent to provide the necessary physical and cognitive assistance at discharge.  Reasons goals not met: Pt ambulation distance limited to 130 feet 2/2 fatigue.   Recommendation:  Patient will benefit from ongoing skilled PT services in home health setting to continue to advance safe functional mobility, address ongoing impairments in gait, balance, strength, coordination, proprioception, and minimize fall risk.  Equipment: Recommending RW and WC, pt has all equipment needed.   Reasons for discharge: treatment goals met and discharge from hospital  Patient/family agrees with progress made and goals achieved: Yes  PT Discharge Precautions/Restrictions Precautions Precautions: Fall Precaution Comments: L hemi; residual R-sided hemi, visual deficits at baseline; HD MWF; fluid restriction Required Braces or Orthoses: Knee Immobilizer - Right;Knee Immobilizer - Left Knee Immobilizer - Right: On when out of bed or walking (for pt comfort) Knee Immobilizer - Left: On when out of bed or walking (for pt comfort) Restrictions Weight Bearing Restrictions Per Provider Order: No Pain Interference Pain Interference Pain Effect on Sleep: 1. Rarely or not at all Pain Interference with Therapy Activities: 1. Rarely or not at all Pain Interference  with Day-to-Day Activities: 1. Rarely or not at all Vision/Perception  Vision - History Ability to See in Adequate Light: 2 Moderately impaired Vision - Assessment Eye Alignment: Within Functional Limits Ocular Range of Motion: Within Functional Limits Tracking/Visual Pursuits: Able to track stimulus in all quads without difficulty Saccades: Within functional limits Convergence: Within functional limits Perception Perception: Within Functional Limits Praxis Praxis: Impaired Praxis Impairment Details: Motor planning  Cognition Overall Cognitive Status: Impaired/Different from baseline Arousal/Alertness: Awake/alert Memory: Impaired Memory Impairment: Decreased recall of new information;Decreased short term memory Decreased Short Term Memory: Functional complex Awareness: Impaired Problem Solving: Impaired Safety/Judgment: Impaired Comments: Slight impulsivity Sensation Sensation Light Touch: Impaired by gross assessment Peripheral sensation comments: Numbness/tingling in LUE (since onset of CVA-pt reports it is improving); pt able to detect light touch in L LE but reports Numbness/tingling in LLE--unable to detect light touch in B LE 2/2 neuropathy, pt denies any N&T R LE.  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 Light Touch Impaired Details: Impaired LUE;Impaired LLE Hot/Cold: Not tested Proprioception: Not tested Stereognosis: Not tested Coordination Gross Motor Movements are Fluid and Coordinated: No Fine Motor Movements are Fluid and Coordinated: No Coordination and Movement Description: Deficits due to hemiparesis-acute L hemi; decreased proprioception and steppage gait L LE Motor  Motor Motor: Hemiplegia;Ataxia Motor - Discharge Observations: acutely L hemi, previous R hemi, pt reports intermittent R LE buckling causing 2 falls at home  Mobility Bed Mobility Bed Mobility: Rolling Right;Rolling Left;Supine to Sit;Sit to  Supine Rolling Right: Independent with assistive device Rolling Left: Independent with assistive device Supine to Sit: Independent with assistive device Sit to Supine: Independent with assistive device Transfers Transfers: Sit to Stand;Stand to Sit;Stand Pivot Transfers Sit to Stand: Contact Guard/Touching assist Stand  to Sit: Contact Guard/Touching assist Stand Pivot Transfers: Contact Guard/Touching assist Stand Pivot Transfer Details: Verbal cues for technique;Verbal cues for precautions/safety;Verbal cues for gait pattern;Verbal cues for safe use of DME/AE Transfer (Assistive device): Rolling walker Locomotion  Gait Ambulation: Yes Gait Assistance: Contact Guard/Touching assist Gait Distance (Feet): 130 Feet Assistive device: Rolling walker Gait Assistance Details: Verbal cues for gait pattern;Verbal cues for safe use of DME/AE;Verbal cues for precautions/safety Gait Gait: Yes Gait Pattern: Impaired Gait Pattern: Ataxic;Left steppage;Step-through pattern;Decreased stride length;Decreased weight shift to left;Decreased stance time - left;Poor foot clearance - right Gait velocity: variable Stairs / Additional Locomotion Stairs: No Corporate treasurer: Yes Wheelchair Assistance: Independent with Scientist, research (life sciences): Both upper extremities Wheelchair Parts Management: Needs assistance  Trunk/Postural Assessment  Cervical Assessment Cervical Assessment: Within Film/video editor Assessment: Within Functional Limits Lumbar Assessment Lumbar Assessment: Within Functional Limits Postural Control Postural Control: Deficits on evaluation Righting Reactions: Delayed Protective Responses: Delayed  Balance Balance Balance Assessed: Yes Standardized Balance Assessment Standardized Balance Assessment: Berg Balance Test Berg Balance Test Sit to Stand: Needs minimal aid to stand or to stabilize Standing  Unsupported: Able to stand 2 minutes with supervision (wide BOS) Sitting with Back Unsupported but Feet Supported on Floor or Stool: Able to sit safely and securely 2 minutes Stand to Sit: Uses backs of legs against chair to control descent Transfers: Needs one person to assist Standing Unsupported with Eyes Closed: Needs help to keep from falling Standing Ubsupported with Feet Together: Needs help to attain position and unable to hold for 15 seconds From Standing, Reach Forward with Outstretched Arm: Loses balance while trying/requires external support From Standing Position, Pick up Object from Floor: Unable to try/needs assist to keep balance From Standing Position, Turn to Look Behind Over each Shoulder: Needs assist to keep from losing balance and falling Turn 360 Degrees: Needs assistance while turning Standing Unsupported, Alternately Place Feet on Step/Stool: Needs assistance to keep from falling or unable to try Standing Unsupported, One Foot in Front: Loses balance while stepping or standing Standing on One Leg: Unable to try or needs assist to prevent fall Total Score: 11 Static Sitting Balance Static Sitting - Balance Support: Feet supported Static Sitting - Level of Assistance: 7: Independent Dynamic Sitting Balance Dynamic Sitting - Balance Support: During functional activity Dynamic Sitting - Level of Assistance: 5: Stand by assistance (supervision) Dynamic Sitting - Balance Activities: Lateral lean/weight shifting;Forward lean/weight shifting;Reaching for objects Static Standing Balance Static Standing - Balance Support: Bilateral upper extremity supported;During functional activity Static Standing - Level of Assistance: 5: Stand by assistance (CGA/close supervision) Dynamic Standing Balance Dynamic Standing - Balance Support: During functional activity;Bilateral upper extremity supported Dynamic Standing - Level of Assistance: 5: Stand by assistance;4: Min assist (CGA/min  A) Dynamic Standing - Balance Activities: Lateral lean/weight shifting;Forward lean/weight shifting;Reaching for objects Extremity Assessment  RLE Assessment RLE Assessment: Exceptions to [facility] General Strength Comments: grossly 3+/5 LLE Assessment LLE Assessment: Exceptions to [facility] General Strength Comments: grossly 3+/5   Today's Interventions  Pt seated in [facility] upon arrival with wife present for family training. Pt agreeable to therapy. Pt denies any pain.   Pt wearing newly delivered knee braces B, therapist assessed for proper fit and alignement. Pt and therpaist adjusted brace to ensure hole over pt patella. Educaiton provided for proper orientation/alignment. Pt and wife verbalized understanding.   Education provided for pt high fall risk and need for RW and CGA for all standing, transfers and gait.  Education provided on pt impulsivity, pt wife witnessed pt attempting to get up prior to therapist or wife present to provide assistance. Reiterated to pt, need to wait until pt wife is holding onto pt until pt standing. Discussed option with wife to use of chair alarm at home for safety and reduction of fall risk.   Education provided on pt decreased L LE proprioception, coordination, 2/2 stroke and sensation deficits in feet impacting gait pattern. Pt ambulated ~100 feet without ankle weight around L LE, and ~100 feet with 4# ankle weight on L LE to demonstrate the difference in gait pattern with and without. Recommending pt utilize home ankle weight around L LE for improved external proprioceptive feedback.   Pt and pt wife returned demonstration of sit to stand, stand pivot transfer, car transfer and short distance ambulation with RW, with emphasis on cues for L LE positioning and obstacle negotiation.  Education provided to utilize [facility] with fatigue, with emphasis on after dialysis.    Reviewed and performed the following HEP:   Access Code: [email or web address] URL:  [email or web address] Date: 11/24/2023 Prepared by: Ambrose Finland  Exercises - Supine Bridge  - 1 x daily - 7 x weekly - 3 sets - 10 reps - Clamshell  - 1 x daily - 7 x weekly - 3 sets - 10 reps - Bent Knee Fallouts  - 1 x daily - 7 x weekly - 3 sets - 10 reps - Supine Heel Slide  - 1 x daily - 7 x weekly - 3 sets - 10 reps - Supine Active Straight Leg Raise  - 1 x daily - 7 x weekly - 3 sets - 10 reps  Pt and pt wife verbalized understanding and deny and questions/concerns.    [facility] [place], [place], DPT  11/24/2023, 7:43 AM

## 2023-11-24 NOTE — Progress Notes (Signed)
[facility] Progress Note   Dialysis Orders: MWF - [facility] ([address]) - [facility] team 4hr, 450/800, EDW 94.8kg (under his dry weight and this will need to be adjusted), 2K/2.5Ca, AVF, no heparin - Calcitriol 0.49mcg q HD - Mircera q 4 week - Venofer 100mg  x 10 (in middle of this) - 2.5L UFG max per cardiologist   Assessment/Plan: 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 Was out of window for thrombolytics per notes. 2. ESRD: Usual MWF schedule. Holiday schedule this week, Sunday, Tuesday and Friday.  Tolerated  HD Sunday with 2 L net UF; seen by cardiology with recommendation of no more than 2.5 L max UF each treatment.  Dialysis today second shift . 3. HTN/volume:  BP better, no edema on exam. 2.5L UFG max per notes. No UF with HD yesterday.  By weight will need to be adjusted at time of discharge.  They are planning on discharging him go approximately 12/26 4. Anemia: Hgb 12.9, no ESA for now. 5. Secondary hyperparathyroidism:  Ca ok, Phos in goal.  Increased Phoslo dose on 12/18. Continue VDRA. 6. T2DM 7. Hx prior CVA: On statin + plavix 8. HFpEF  Subjective:   Patient seen and examined in a wheelchair.  Tolerated dialysis Sun.   Denies CP, SOB, abdominal pain and n/v/d.  Knee tends to buckle when it is flexed but when he is standing tends to be more stable.  Objective Vitals:   11/23/23 1404 11/23/23 1942 11/24/23 0500 11/24/23 0522  BP: 123/80 96/78  118/75  Pulse: 77 79  70  Resp:  16  16  Temp: (!) 97.5 F (36.4 C) 98.1 F (36.7 C)  98.5 F (36.9 C)  TempSrc: Oral Oral    SpO2: 100% (!) 89%  95%  Weight:   92.6 kg   Height:       Physical Exam General:WDWN male in NAD Heart:RRR, no mrg Lungs:CTAB, nml WOB on RA Abdomen:soft, NTND Extremities:no LE edema Dialysis Access: RU BBT +b/t   Filed Weights   11/22/23 1823 11/23/23 0500 11/24/23 0500  Weight: 90.2 kg 92.4 kg 92.6 kg    Intake/Output  Summary (Last 24 hours) at 11/24/2023 0916 Last data filed at 11/23/2023 1824 Gross per 24 hour  Intake 356 ml  Output --  Net 356 ml    Additional Objective Labs: Basic Metabolic Panel: Recent Labs  Lab 11/18/23 0515 11/22/23 1335  NA 134* 132*  K 3.9 4.9  CL 95* 91*  CO2 25 25  GLUCOSE 166* 275*  BUN 50* 55*  CREATININE 10.21* 10.39*  CALCIUM 8.9 9.4  PHOS 6.6* 5.0*   Liver Function Tests: Recent Labs  Lab 11/18/23 0515 11/22/23 1335  ALBUMIN 3.3* 3.6   CBC: Recent Labs  Lab 11/18/23 0515 11/22/23 1335  WBC 7.2 7.7  HGB 12.2* 12.9*  HCT 37.8* 40.4  MCV 83.1 84.2  PLT 276 240   CBG: Recent Labs  Lab 11/23/23 0537 11/23/23 1204 11/23/23 1715 11/23/23 2116 11/24/23 0549  GLUCAP 207* 204* 217* 197* 122*    Medications:    aspirin  81 mg Oral Daily   atorvastatin  80 mg Oral QHS   buPROPion  150 mg Oral Daily   calcitRIOL  0.5 mcg Oral Q M,W,F-1800   calcium acetate  1,334 mg Oral BID WC   Chlorhexidine Gluconate Cloth  6 each Topical Q0600   Chlorhexidine Gluconate Cloth  6 each Topical Q0600   clopidogrel  75 mg Oral Daily   dorzolamide-timolol  1 drop Both Eyes BID   FLUoxetine  20 mg Oral QHS   heparin  5,000 Units Subcutaneous Q8H   insulin aspart  0-6 Units Subcutaneous TID WC   insulin glargine-yfgn  14 Units Subcutaneous BID   latanoprost  1 drop Left Eye QHS   melatonin  3 mg Oral QHS   metoprolol succinate  25 mg Oral Daily   pantoprazole  40 mg Oral QHS

## 2023-11-25 DIAGNOSIS — N186 End stage renal disease: Secondary | ICD-10-CM | POA: Diagnosis not present

## 2023-11-25 DIAGNOSIS — E1142 Type 2 diabetes mellitus with diabetic polyneuropathy: Secondary | ICD-10-CM | POA: Diagnosis not present

## 2023-11-25 DIAGNOSIS — I63019 Cerebral infarction due to thrombosis of unspecified vertebral artery: Secondary | ICD-10-CM | POA: Diagnosis not present

## 2023-11-25 LAB — GLUCOSE, CAPILLARY
Glucose-Capillary: 167 mg/dL — ABNORMAL HIGH (ref 70–99)
Glucose-Capillary: 167 mg/dL — ABNORMAL HIGH (ref 70–99)
Glucose-Capillary: 259 mg/dL — ABNORMAL HIGH (ref 70–99)
Glucose-Capillary: 282 mg/dL — ABNORMAL HIGH (ref 70–99)

## 2023-11-25 NOTE — Progress Notes (Signed)
Inpatient Rehabilitation Care Coordinator Discharge Note   Patient Details  Name: Bobby Sampson MRN: [id number] Date of Birth: 05/17/1961   Discharge location: D/c to home with wife  Length of Stay: 9 days  Discharge activity level: ambulatory level  CGA/min A, with use of WC for community  Home/community participation: Limited  Patient response EX:BMWUXL Literacy - How often do you need to have someone help you when you read instructions, pamphlets, or other written material from your doctor or pharmacy?: Never  Patient response KG:MWNUUV Isolation - How often do you feel lonely or isolated from those around you?: Never  Services provided included: MD, RD, PT, OT, SLP, RN, TR, Pharmacy, SW, Neuropsych, CM  Financial Services:  Field seismologist Utilized: Private Insurance [employer] Dual Complete  Choices offered to/list presented to: patient and wife  Follow-up services arranged:              Patient response to transportation need: Is the patient able to respond to transportation needs?: Yes In the past 12 months, has lack of transportation kept you from medical appointments or from getting medications?: No In the past 12 months, has lack of transportation kept you from meetings, work, or from getting things needed for daily living?: No   Patient/Family verbalized understanding of follow-up arrangements:  Yes  Individual responsible for coordination of the follow-up plan: contact pt wife Angelique Blonder [phone]  Confirmed correct DME delivered: Gretchen Short 11/25/2023    Comments (or additional information):fam edu completed  Summary of Stay    Date/Time Discharge Planning CSW  11/23/23 1532 HOme with wife and daughter-wife works and daughter works third shift. Pt has aide 3 hrs M-F and two hours on the weekend. Goes to HD M,W,F. Fam edu tomorrow 9am-12pm with pt wife. SW will confirm there are no barriers to discharge. AAC  11/23/23 1417 HOme with wife and  daughter-wife works and daughter works third shift. Pt has aide 3 hrs M-F and two hours on the weekend. Goes to HD M,W,F .SW will confirm there are no barriers to discharge. AAC  11/18/23 1420 HOme with wife and daughter-wife works and daughter works third shift. Pt has aide 3 hrs M-F and two hours on the weekend. Goes to HD M,W,F RGD       Gretchen Short

## 2023-11-25 NOTE — Plan of Care (Signed)
  Problem: Consults Goal: RH STROKE PATIENT EDUCATION Description: See Patient Education module for education specifics  Outcome: Progressing   Problem: RH BOWEL ELIMINATION Goal: RH STG MANAGE BOWEL WITH ASSISTANCE Description: STG Manage Bowel with toileting Assistance. Outcome: Progressing   Problem: RH SAFETY Goal: RH STG ADHERE TO SAFETY PRECAUTIONS W/ASSISTANCE/DEVICE Description: STG Adhere to Safety Precautions With cues Assistance/Device. Outcome: Progressing   Problem: RH KNOWLEDGE DEFICIT Goal: RH STG INCREASE KNOWLEDGE OF DIABETES Description: Patient and spouse will be able to manage DM with educational resources for medications and dietary modifications independently Outcome: Progressing Goal: RH STG INCREASE KNOWLEDGE OF HYPERTENSION Description: Patient and spouse will be able to manage HTN with educational resources for medications and dietary modifications independently Outcome: Progressing Goal: RH STG INCREASE KNOWLEGDE OF HYPERLIPIDEMIA Description: Patient and spouse will be able to manage HLD with educational resources for medications and dietary modifications independently Outcome: Progressing Goal: RH STG INCREASE KNOWLEDGE OF STROKE PROPHYLAXIS Description: Patient and spouse will be able to manage secondary risks with educational resources for medications and dietary modifications independently Outcome: Progressing   Problem: RH BOWEL ELIMINATION Goal: RH STG MANAGE BOWEL WITH ASSISTANCE Description: STG Manage Bowel with Assistance. Outcome: Progressing

## 2023-11-25 NOTE — Progress Notes (Signed)
PROGRESS NOTE   Subjective/Complaints:  Appreciate nephro and HD note , no SLP swallow eval yet Pt felt weak after HD, discussed 3L UF removed, pt thought only 2.5L would be removed    ROS: Patient denies CP, SOB, N/V/D  + insomnia- continues Numbness-improving gradually   Objective:   No results found. Recent Labs    11/22/23 1335 11/24/23 1344  WBC 7.7 7.7  HGB 12.9* 12.6*  HCT 40.4 38.8*  PLT 240 254    Recent Labs    11/22/23 1335 11/24/23 1344  NA 132* 130*  K 4.9 5.3*  CL 91* 89*  CO2 25 24  GLUCOSE 275* 256*  BUN 55* 59*  CREATININE 10.39* 10.58*  CALCIUM 9.4 9.8     Intake/Output Summary (Last 24 hours) at 11/25/2023 0806 Last data filed at 11/24/2023 1813 Gross per 24 hour  Intake --  Output 202 ml  Net -202 ml        Physical Exam: Vital Signs Blood pressure 99/67, pulse 79, temperature 97.8 F (36.6 C), resp. rate 17, height 5\' 9"  (1.753 m), weight 89.6 kg, SpO2 97%.   ENT:  Throat is clear , no adenopathy  General: No acute distress Mood and affect are appropriate Heart: Regular rate and rhythm no rubs murmurs or extra sounds Lungs: Clear to auscultation, breathing unlabored, no rales or wheezes Abdomen: Positive bowel sounds, soft nontender to palpation, nondistended Extremities: No clubbing, cyanosis, or edema Skin: No evidence of breakdown, no evidence of rash Neuro:  stable 12/23 Sensory exam normal sensation to light touch and proprioception in bilateral upper and lower extremities Cerebellar exam mild dysmetria LLE>LUE  Musculoskeletal: Full range of motion in all 4 extremities. No joint swelling   MMT: RUE and RLE 5/5 prox to distal. LUE 4+/5 prox to distal  Impaired FTN/FMC LUE.   LLE 4+/5.   Sensation touch sensation intact left upper extremity. No abnl resting tone.   Musculoskeletal:  Missing digits 1 and 2 on the right,      Assessment/Plan: 1.  Functional deficits which require 3+ hours per day of interdisciplinary therapy in a comprehensive inpatient rehab setting. Physiatrist is providing close team supervision and 24 hour management of active medical problems listed below. Physiatrist and rehab team continue to assess barriers to discharge/monitor patient progress toward functional and medical goals  Care Tool:  Bathing    Body parts bathed by patient: Right arm, Left arm, Chest, Abdomen, Front perineal area, Buttocks, Right upper leg, Left upper leg, Right lower leg, Left lower leg, Face         Bathing assist Assist Level: Contact Guard/Touching assist     Upper Body Dressing/Undressing Upper body dressing   What is the patient wearing?: Pull over shirt    Upper body assist Assist Level: Set up assist    Lower Body Dressing/Undressing Lower body dressing      What is the patient wearing?: Pants, Underwear/pull up     Lower body assist Assist for lower body dressing: Contact Guard/Touching assist     Toileting Toileting Toileting Activity did not occur (Clothing management and hygiene only): N/A (no void or bm)  Toileting assist Assist for  toileting: Minimal Assistance - Patient > 75%     Transfers Chair/bed transfer  Transfers assist     Chair/bed transfer assist level: Contact Guard/Touching assist     Locomotion Ambulation   Ambulation assist      Assist level: Contact Guard/Touching assist Assistive device: Walker-rolling Max distance: 130   Walk 10 feet activity   Assist     Assist level: Contact Guard/Touching assist Assistive device: Walker-rolling   Walk 50 feet activity   Assist    Assist level: Contact Guard/Touching assist Assistive device: Walker-rolling    Walk 150 feet activity   Assist Walk 150 feet activity did not occur: Safety/medical concerns (fatigue)         Walk 10 feet on uneven surface  activity   Assist Walk 10 feet on uneven surfaces  activity did not occur: Safety/medical concerns (fatigue)   Assist level: Contact Guard/Touching assist Assistive device: Walker-rolling   Wheelchair     Assist Is the patient using a wheelchair?: Yes Type of Wheelchair: Manual    Wheelchair assist level: Independent Max wheelchair distance: 150    Wheelchair 50 feet with 2 turns activity    Assist        Assist Level: Supervision/Verbal cueing   Wheelchair 150 feet activity     Assist      Assist Level: Supervision/Verbal cueing   Blood pressure 99/67, pulse 79, temperature 97.8 F (36.6 C), resp. rate 17, height 5\' 9"  (1.753 m), weight 89.6 kg, SpO2 97%.  Medical Problem List and Plan: 1. 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  Antithrombotics: -DVT/anticoagulation:  Pharmaceutical: Heparin             -antiplatelet therapy: Aspirin and Plavix for three weeks followed by Plavix alone   3. Bilateral knee pain: placed order for [employer] to eval current braces (which belong to his wife) and to order similar ones. Tylenol as needed   4. Mood/Behavior/Sleep: LCSW to evaluate and provide emotional support             -continue Wellbutrin XL and Prozac             -antipsychotic agents: n/a   5. Neuropsych/cognition: This patient is capable of making decisions on his own behalf.   6. Skin/Wound Care: Routine skin care checks   7. Fluids/Electrolytes/Nutrition: Strict Is and Os.  Laboratory follow-up per nephrology             -renal diet; 1200 mL fluid restriction   8: Hypertension: monitor TID and prn             -continue Toprol-XL 25 mg daily  -12/20 baseline blood pressures stable, continue current regimen for now    11/25/2023    5:32  AM 11/25/2023    5:18 AM 11/24/2023    9:52 PM  Vitals with BMI  Weight 197 lbs 10 oz    BMI 29.17    Systolic  99 103  Diastolic  67 77  Pulse  79 87   BP and HR controlled 12/24   9: Hyperlipidemia: continue statin   10:  Glaucoma: continue Cosopt and Xalatan   11: DM: CBGs QID, A1c = 6.5% (home regimen: insulin glargine 20 units twice daily, NovoLog insulin 7 units 3 times daily with meals)              -continue Novolog 5 units with meals             -continue Semglee 15 units BID  -12/18 dc NovoLog  with meals due to hypoglycemia last night, restart correctional, Decrease semglee to 13u BID  -12/20 CBGs fair control, continue current regimen for now   CBG (last 3)  Recent Labs    11/24/23 1811 11/24/23 2043 11/25/23 0543  GLUCAP 157* 243* 167*   Increase semglee to 14U, am CBG controlled   12: ESRD on HD on M/W/F via RUE AVF             - on Phoslo BID and Rocaltrol M/W/F             -HD at the end of the day to allow participation in therapies  -Reviewed nephrology note, HD today  13: OSA: CPAP  -12/19 reports good results with CPAP last night -12/20 patient reports he did not qualify for CPAP during the study he was doing  14: CHF: echo>>55-60%, grade 1 DD -volume status per nephrology/HD -No signs to indicate fluid overload -weights reviewed and stable Filed Weights   11/24/23 1305 11/24/23 1659 11/25/23 0532  Weight: 92.6 kg 90.6 kg 89.6 kg      15: Obesity: BMI 34.61  16.  Orthostatic hypotension   -continueCompression stockings and abdominal binder    LOS: 8 days A FACE TO FACE EVALUATION WAS PERFORMED  Erick Colace 11/25/2023, 8:06 AM

## 2023-11-25 NOTE — Progress Notes (Signed)
[facility] Progress Note   Dialysis Orders: MWF - [facility] ([address]) - [facility] team 4hr, 450/800, EDW 94.8kg (under his dry weight and this will need to be adjusted), 2K/2.5Ca, AVF, no heparin - Calcitriol 0.48mcg q HD - Mircera q 4 week - Venofer 100mg  x 10 (in middle of this) - 2.5L UFG max per cardiologist   Assessment/Plan: 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 Was out of window for thrombolytics per notes. 2. ESRD: Usual MWF schedule. Holiday schedule this week, Sunday, Tuesday and Friday.  Tolerated  HD Sunday with 2 L net UF; seen by [facility] cardiology previously with recommendation of no more than 2.5 L max UF each treatment.  Dialysis Tuesday and he felt very drained afterwards with cramping.  Weight this morning was approximately 197 pounds.  Being discharged tomorrow and his new EDW should be 90.5-91kg  3. HTN/volume:  BP better, no edema on exam. 2.5L UFG max per notes. No UF with HD yesterday.  By weight will need to be adjusted at time of discharge.  They are planning on discharging him go approximately 12/26 4. Anemia: Hgb 12.9, no ESA for now. 5. Secondary hyperparathyroidism:  Ca ok, Phos in goal.  Increased Phoslo dose on 12/18. Continue VDRA. 6. T2DM 7. Hx prior CVA: On statin + plavix 8. HFpEF  Subjective:   Patient seen and examined in a wheelchair.  Tolerated dialysis Sun but very drained Tuesday.   Denies CP, SOB, but had some cramping after dialysis Tuesday .  Knee tends to buckle when it is flexed but when he is standing tends to be more stable.  Objective Vitals:   11/24/23 2152 11/25/23 0518 11/25/23 0532 11/25/23 0833  BP: 103/77 99/67  109/69  Pulse: 87 79  91  Resp: 18 17  18   Temp: 97.7 F (36.5 C) 97.8 F (36.6 C)  (!) 97.5 F (36.4 C)  TempSrc:    Oral  SpO2: 100% 97%  100%  Weight:   89.6 kg   Height:       Physical Exam General:WDWN male in NAD Heart:RRR, no mrg Lungs:CTAB,  nml WOB on RA Abdomen:soft, NTND Extremities:no LE edema Dialysis Access: RU BBT +b/t   Filed Weights   11/24/23 1305 11/24/23 1659 11/25/23 0532  Weight: 92.6 kg 90.6 kg 89.6 kg    Intake/Output Summary (Last 24 hours) at 11/25/2023 1016 Last data filed at 11/24/2023 1813 Gross per 24 hour  Intake --  Output 202 ml  Net -202 ml    Additional Objective Labs: Basic Metabolic Panel: Recent Labs  Lab 11/22/23 1335 11/24/23 1344  NA 132* 130*  K 4.9 5.3*  CL 91* 89*  CO2 25 24  GLUCOSE 275* 256*  BUN 55* 59*  CREATININE 10.39* 10.58*  CALCIUM 9.4 9.8  PHOS 5.0* 5.5*   Liver Function Tests: Recent Labs  Lab 11/22/23 1335 11/24/23 1344  ALBUMIN 3.6 3.6   CBC: Recent Labs  Lab 11/22/23 1335 11/24/23 1344  WBC 7.7 7.7  HGB 12.9* 12.6*  HCT 40.4 38.8*  MCV 84.2 82.7  PLT 240 254   CBG: Recent Labs  Lab 11/24/23 0549 11/24/23 1211 11/24/23 1811 11/24/23 2043 11/25/23 0543  GLUCAP 122* 254* 157* 243* 167*    Medications:    aspirin  81 mg Oral Daily   atorvastatin  80 mg Oral QHS   buPROPion  150 mg Oral Daily   calcitRIOL  0.5 mcg Oral Q  M,W,F-1800   calcium acetate  1,334 mg Oral BID WC   Chlorhexidine Gluconate Cloth  6 each Topical Q0600   Chlorhexidine Gluconate Cloth  6 each Topical Q0600   clopidogrel  75 mg Oral Daily   dorzolamide-timolol  1 drop Both Eyes BID   FLUoxetine  20 mg Oral QHS   heparin  5,000 Units Subcutaneous Q8H   insulin aspart  0-6 Units Subcutaneous TID WC   insulin glargine-yfgn  14 Units Subcutaneous BID   latanoprost  1 drop Left Eye QHS   melatonin  3 mg Oral QHS   metoprolol succinate  25 mg Oral Daily   pantoprazole  40 mg Oral QHS

## 2023-11-26 ENCOUNTER — Other Ambulatory Visit (HOSPITAL_COMMUNITY): Payer: Self-pay

## 2023-11-26 ENCOUNTER — Inpatient Hospital Stay (HOSPITAL_COMMUNITY): Payer: 59

## 2023-11-26 DIAGNOSIS — I63019 Cerebral infarction due to thrombosis of unspecified vertebral artery: Secondary | ICD-10-CM | POA: Diagnosis not present

## 2023-11-26 DIAGNOSIS — E1142 Type 2 diabetes mellitus with diabetic polyneuropathy: Secondary | ICD-10-CM | POA: Diagnosis not present

## 2023-11-26 DIAGNOSIS — N186 End stage renal disease: Secondary | ICD-10-CM | POA: Diagnosis not present

## 2023-11-26 LAB — GLUCOSE, CAPILLARY
Glucose-Capillary: 213 mg/dL — ABNORMAL HIGH (ref 70–99)
Glucose-Capillary: 233 mg/dL — ABNORMAL HIGH (ref 70–99)

## 2023-11-26 MED ORDER — CALCIUM ACETATE (PHOS BINDER) 667 MG PO CAPS: 667.0000 mg | ORAL_CAPSULE | Freq: Two times a day (BID) | ORAL | Status: AC

## 2023-11-26 MED ORDER — LANTUS SOLOSTAR 100 UNIT/ML ~~LOC~~ SOPN: 20.0000 [IU] | PEN_INJECTOR | Freq: Two times a day (BID) | SUBCUTANEOUS | Status: AC

## 2023-11-26 NOTE — Progress Notes (Signed)
Inpatient Rehabilitation Discharge Medication Review by a Pharmacist  A complete drug regimen review was completed for this patient to identify any potential clinically significant medication issues.  High Risk Drug Classes Is patient taking? Indication by Medication  Antipsychotic No   Anticoagulant No   Antibiotic No   Opioid No   Antiplatelet Yes bASA, Plavix thru 12/05/23, then Plavix alone - stroke  Hypoglycemics/insulin Yes Novolog, Lantus - DM  Vasoactive Medication Yes Metoprolol - HTN  Chemotherapy No   Other Yes Calcitriol - vitamin D analog Phoslo - hyperphosphatemia Protonix - acid reflux Atorvastatin - HLD/stroke Bupropion, fluoxetine - mood Melatonin- sleep Cosopt/Xalatan - glaucoma     Type of Medication Issue Identified Description of Issue Recommendation(s)  Drug Interaction(s) (clinically significant)     Duplicate Therapy     Allergy     No Medication Administration End Date     Incorrect Dose     Additional Drug Therapy Needed     Significant med changes from prior encounter (inform family/care partners about these prior to discharge). Stopping amlodipine Included on discharge summary to HOLD  Other       Clinically significant medication issues were identified that warrant physician communication and completion of prescribed/recommended actions by midnight of the next day:  No  Name of provider notified for urgent issues identified:   Provider Method of Notification:    Pharmacist comments:   Time spent performing this drug regimen review (minutes):  15  Rexford Maus, PharmD, BCPS 11/26/2023 9:45 AM

## 2023-11-26 NOTE — Progress Notes (Signed)
Inpatient Rehabilitation Care Coordinator Discharge Note   Patient Details  Name: Bobby Sampson MRN: [id number] Date of Birth: [birth year]   Discharge location: D/c to home with wife  Length of Stay: 9 days  Discharge activity level: ambulatory level  CGA/min A, with use of WC for community  Home/community participation: Limited  Patient response WU:JWJXBJ Literacy - How often do you need to have someone help you when you read instructions, pamphlets, or other written material from your doctor or pharmacy?: Never  Patient response YN:WGNFAO Isolation - How often do you feel lonely or isolated from those around you?: Never  Services provided included: MD, RD, PT, OT, SLP, RN, TR, Pharmacy, SW, Neuropsych, CM  Financial Services:  Field seismologist Utilized: Production designer, theatre/television/film Dual Complete  Choices offered to/list presented to: patient and wife  Follow-up services arranged:  DME, Home Health, Patient/Family has no preference for HH/DME agencies Home Health Agency: [facility] for HHPT/OT/SLP    DME : [facility] for TTB    Patient response to transportation need: Is the patient able to respond to transportation needs?: Yes In the past 12 months, has lack of transportation kept you from medical appointments or from getting medications?: No In the past 12 months, has lack of transportation kept you from meetings, work, or from getting things needed for daily living?: No   Patient/Family verbalized understanding of follow-up arrangements:  Yes  Individual responsible for coordination of the follow-up plan: contact pt wife Angelique Blonder [phone]  Confirmed correct DME delivered: Gretchen Short 11/26/2023    Comments (or additional information):fam edu completed  Summary of Stay    Date/Time Discharge Planning CSW  11/23/23 1532 HOme with wife and daughter-wife works and daughter works third shift. Pt has aide 3 hrs M-F and two hours on the weekend. Goes to HD M,W,F.  Fam edu tomorrow 9am-12pm with pt wife. SW will confirm there are no barriers to discharge. AAC  11/23/23 1417 HOme with wife and daughter-wife works and daughter works third shift. Pt has aide 3 hrs M-F and two hours on the weekend. Goes to HD M,W,F .SW will confirm there are no barriers to discharge. AAC  11/18/23 1420 HOme with wife and daughter-wife works and daughter works third shift. Pt has aide 3 hrs M-F and two hours on the weekend. Goes to HD M,W,F RGD       Gretchen Short

## 2023-11-26 NOTE — Progress Notes (Signed)
Contacted [facility] to confirm pt's d/c date for today. Clinic advised pt should resume care tomorrow.   Olivia Canter Renal Navigator [phone]

## 2023-11-26 NOTE — Progress Notes (Signed)
Speech Language Pathology Discharge Summary  Patient Details  Name: Bobby Sampson MRN: [id number] Date of Birth: [birth year]  Date of Discharge from SLP service:November 26, 2023  Today's Date: 11/26/2023 SLP Individual Time: 1020-1035 SLP Individual Time Calculation (min): 15 min   Skilled Therapeutic Interventions:   Skilled therapy session focused on patient education. SLP reviewed results of modified barium swallow study (MBSS) completed today and subsequent recommendations. SLP educated patient on recommendation to utilize 5cc provale cup to reduce risk of aspiration. SLP provided handout regarding swallowing compensatory strategies and aspiration precautions. Recommend follow up ST services to continue to address dysphagia observed on MBSS this date. Patient verbalized understanding with no further questions. Patient left in bed with alarm set and call bell in reach. Continue POC.      Patient has met  (none) of 3 long term goals.  Patient to discharge at overall Supervision level.  Reasons goals not met: SLP completed x1 session with patient for family education, no treatment sessions completed   Clinical Impression/Discharge Summary:  Pt has met 0 of 3 LTG's this admission as only ST treatment completed was for family education, therefore goals not addressed. Pt is currently an overall supervisionA for cognitive tasks and requires supervision cues for utilization of swallowing compensatory strategies to minimize overt s/sx of aspiration with regular/thin diet with use of 5cc provale cup. Pt/family education complete and pt will discharge home with supervision from friends/family/etc. Pt would benefit from OP f/u ST services to maximize cognition and swallowing safety in order to maximize functional independence.     Recommendation:  Outpatient SLP  Rationale for SLP Follow Up: Maximize swallowing safety   Equipment: n/a   Reasons for discharge: Discharged from hospital    Patient/Family Agrees with Progress Made and Goals Achieved: Yes    Kanton Kamel M.A., CF-SLP 11/26/2023, 10:46 AM

## 2023-11-26 NOTE — Progress Notes (Signed)
PROGRESS NOTE   Subjective/Complaints:  Appreciate nephro and HD note , no new swallowing issues Discussed home insulin regimen, see endo , wears a Dexcom glucose monitor    ROS: Patient denies CP, SOB, N/V/D     Objective:   No results found. Recent Labs    11/24/23 1344  WBC 7.7  HGB 12.6*  HCT 38.8*  PLT 254    Recent Labs    11/24/23 1344  NA 130*  K 5.3*  CL 89*  CO2 24  GLUCOSE 256*  BUN 59*  CREATININE 10.58*  CALCIUM 9.8     Intake/Output Summary (Last 24 hours) at 11/26/2023 0709 Last data filed at 11/25/2023 1823 Gross per 24 hour  Intake 360 ml  Output --  Net 360 ml        Physical Exam: Vital Signs Blood pressure 104/69, pulse 70, temperature 98.4 F (36.9 C), temperature source Oral, resp. rate 16, height 5\' 9"  (1.753 m), weight 84.9 kg, SpO2 92%.   ENT:  Throat is clear , no adenopathy  General: No acute distress Mood and affect are appropriate Heart: Regular rate and rhythm no rubs murmurs or extra sounds Lungs: Clear to auscultation, breathing unlabored, no rales or wheezes Abdomen: Positive bowel sounds, soft nontender to palpation, nondistended Extremities: No clubbing, cyanosis, or edema Skin: No evidence of breakdown, no evidence of rash Neuro:  stable 12/26 Sensory exam normal sensation to light touch and proprioception in bilateral upper and lower extremities Cerebellar exam mild dysmetria LLE>LUE  Musculoskeletal: Full range of motion in all 4 extremities. No joint swelling   MMT: RUE and RLE 5/5 prox to distal. LUE 4+/5 prox to distal  Impaired FTN/FMC LUE.   LLE 4+/5.   Sensation touch sensation intact left upper extremity. No abnl resting tone.   Musculoskeletal:  Missing digits 1 and 2 on the right,      Assessment/Plan: 1. Functional deficits which require 3+ hours per day of interdisciplinary therapy in a comprehensive inpatient rehab  setting. Physiatrist is providing close team supervision and 24 hour management of active medical problems listed below. Physiatrist and rehab team continue to assess barriers to discharge/monitor patient progress toward functional and medical goals  Care Tool:  Bathing    Body parts bathed by patient: Right arm, Left arm, Chest, Abdomen, Front perineal area, Buttocks, Right upper leg, Left upper leg, Right lower leg, Left lower leg, Face         Bathing assist Assist Level: Contact Guard/Touching assist     Upper Body Dressing/Undressing Upper body dressing   What is the patient wearing?: Pull over shirt    Upper body assist Assist Level: Set up assist    Lower Body Dressing/Undressing Lower body dressing      What is the patient wearing?: Pants, Underwear/pull up     Lower body assist Assist for lower body dressing: Contact Guard/Touching assist     Toileting Toileting Toileting Activity did not occur (Clothing management and hygiene only): N/A (no void or bm)  Toileting assist Assist for toileting: Minimal Assistance - Patient > 75%     Transfers Chair/bed transfer  Transfers assist     Chair/bed  transfer assist level: Contact Guard/Touching assist     Locomotion Ambulation   Ambulation assist      Assist level: Contact Guard/Touching assist Assistive device: Walker-rolling Max distance: 130   Walk 10 feet activity   Assist     Assist level: Contact Guard/Touching assist Assistive device: Walker-rolling   Walk 50 feet activity   Assist    Assist level: Contact Guard/Touching assist Assistive device: Walker-rolling    Walk 150 feet activity   Assist Walk 150 feet activity did not occur: Safety/medical concerns (fatigue)         Walk 10 feet on uneven surface  activity   Assist Walk 10 feet on uneven surfaces activity did not occur: Safety/medical concerns (fatigue)   Assist level: Contact Guard/Touching assist Assistive  device: Walker-rolling   Wheelchair     Assist Is the patient using a wheelchair?: Yes Type of Wheelchair: Manual    Wheelchair assist level: Independent Max wheelchair distance: 150    Wheelchair 50 feet with 2 turns activity    Assist        Assist Level: Supervision/Verbal cueing   Wheelchair 150 feet activity     Assist      Assist Level: Supervision/Verbal cueing   Blood pressure 104/69, pulse 70, temperature 98.4 F (36.9 C), temperature source Oral, resp. rate 16, height 5\' 9"  (1.753 m), weight 84.9 kg, SpO2 92%.  Medical Problem List and Plan: 1. 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  Antithrombotics: -DVT/anticoagulation:  Pharmaceutical: Heparin             -antiplatelet therapy: Aspirin and Plavix for three weeks followed by Plavix alone   3. Bilateral knee pain: placed order for [employer] to eval current braces (which belong to his wife) and to order similar ones. Tylenol as needed   4. Mood/Behavior/Sleep: LCSW to evaluate and provide emotional support             -continue Wellbutrin XL and Prozac             -antipsychotic agents: n/a   5. Neuropsych/cognition: This patient is capable of making decisions on his own behalf.   6. Skin/Wound Care: Routine skin care checks   7. Fluids/Electrolytes/Nutrition: Strict Is and Os.  Laboratory follow-up per nephrology             -renal diet; 1200 mL fluid restriction   8: Hypertension: monitor TID and prn             -continue Toprol-XL 25 mg daily  -12/20 baseline blood pressures stable, continue current regimen for now    11/26/2023    5:01 AM 11/25/2023    8:07 PM 11/25/2023    3:24 PM  Vitals with BMI  Weight 187 lbs 3 oz     BMI 27.63    Systolic 104 114 782  Diastolic 69 64 81  Pulse 70 73 73   BP and HR controlled 12/24   9: Hyperlipidemia: continue statin   10: Glaucoma: continue Cosopt and Xalatan   11: DM: CBGs QID, A1c = 6.5% (home regimen: insulin glargine 20  units twice daily, NovoLog insulin 7 units 3 times daily with meals)              -continue Novolog 5 units with meals             -continue Semglee 15 units BID  -12/18 dc NovoLog  with meals due to hypoglycemia last night, restart correctional, Decrease semglee to 13u BID  -12/20 CBGs fair control, continue current regimen for now   CBG (last 3)  Recent Labs    11/25/23 1621 11/25/23 2037 11/26/23 0606  GLUCAP 259* 282* 213*   Home control was good, sees endocrine, will resume CBG monitoring device and home insulin  regimen   12: ESRD on HD on M/W/F via RUE AVF             - on Phoslo BID and Rocaltrol M/W/F            OP HD in am , has ride set up   13: OSA: CPAP  -12/19 reports good results with CPAP last night -12/20 patient reports he did not qualify for CPAP during the study he was doing  14: CHF: echo>>55-60%, grade 1 DD -volume status per nephrology/HD -No signs to indicate fluid overload -weights reviewed and stable Filed Weights   11/24/23 1659 11/25/23 0532 11/26/23 0501  Weight: 90.6 kg 89.6 kg 84.9 kg      15: Obesity: BMI 34.61  16.  Orthostatic hypotension   -continueCompression stockings and abdominal binder    LOS: 9 days A FACE TO FACE EVALUATION WAS PERFORMED  Erick Colace 11/26/2023, 7:09 AM

## 2023-11-26 NOTE — Procedures (Addendum)
Modified Barium Swallow Study  Patient Details  Name: Bobby Sampson MRN: [id number] Date of Birth: 07-07-1961  Today's Date: 11/26/2023  Modified Barium Swallow completed.  Full report located under Chart Review in the Imaging Section.  History of Present Illness Pt is a 62 y/o male withPMH of ESRD on HD MWF, HTN, HLD, DM, CHF, OSA non compliant with CPAP, and previous L pontine CVAs x2 with residual R hemiparesis who admitted to [facility] on 12/15 with c/o left sided weakness and sensory changes.  NIHSS 4, A1C 6.5.  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  EF 55-60% with grade 1 diastolic dysfunction, no shunt.  Neurology recommend asa 81 daily and clopidogrel 75 mg daily x 3 weeks, then plavix alone.  Encourage compliance with Cpap.   Clinical Impression Patient presents with mild-moderate oropharyngeal dysphagia. Oral phase is characterized by reduced lingual control/coordination resulting in posterior spillage to the pyriforms and trace-mild amounts of oral residuals located on tongue base. Pharyngeal phase is remarkable for reduced sensation, mistiming vs incomplete epiglottic inversion and laryngeal vestibule closure resulting in silent aspiration of thin liquids via cup and transient penetration of NTL. No penetration/aspiration present during tsp sips of thin and across remaining consistencies. SLP trialed chin tuck and cued cough with thin liquids, though ineffective. Trace pharyngeal residuals present, notably on base of tongue. Pill was administered via puree with suspected esophageal retention and retrograde flow below the PES. Recommend further esophageal evaluation. Recommend continuation of regular diet with thin liquids via 5cc provale cup. Medications should be provided whole in puree due to presence of aspiration during large volumes of thin liquids. Patient should utilize standardized  swallowing precautions including small bites/sips, slow rate, sit upright at 90 degrees and for at least 30 minutes after meals.   Swallow Evaluation Recommendations Recommendations: PO diet PO Diet Recommendation: Regular;Thin liquids (Level 0) Liquid Administration via: Other (Comment);Spoon (5cc provale) Medication Administration: Whole meds with puree Supervision: Patient able to self-feed Swallowing strategies  : Minimize environmental distractions;Slow rate;Small bites/sips Postural changes: Position pt fully upright for meals;Stay upright 30-60 min after meals Oral care recommendations: Oral care BID (2x/day) Recommended consults: Consider esophageal assessment;Consider GI consultation   Karalina Tift M.A., CF-SLP 11/26/2023,12:51 PM

## 2023-11-26 NOTE — Discharge Planning (Signed)
[facility] Patient Discharge Orders- [facility] CLINIC: [place] [facility]  Patient's name: Bobby Sampson Admit/DC Dates: 11/17/2023 - to be discharged 11/26/2023  Discharge Diagnoses: Acute R dorsal pons CVA      Aranesp: Given: No   Date and amount of last dose: NA  Last Hgb: 12.6 PRBC's Given: No Date/# of units: NA ESA dose for discharge: mircera 0 mcg IV q 2 weeks dose per protocl IV Iron dose at discharge: p  Heparin change: No  EDW Change: Yes New EDW: 87.5  Bath Change: No  Access intervention/Change: No Details:  Hectorol/Calcitriol change: No  Discharge Labs: Calcium 9.8 Phosphorus 5.5 Albumin 3.6 K+ 5.3  IV Antibiotics: No Details:  On Coumadin?: No Last INR: Next INR: Managed By:   OTHER/APPTS/LAB ORDERS:    D/C Meds to be reconciled by nurse after every discharge.  Completed By: Alonna Buckler [facility] [phone]   Reviewed by: MD:______ RN_______

## 2023-11-26 NOTE — Patient Care Conference (Cosign Needed)
Inpatient RehabilitationTeam Conference and Plan of Care Update Date: 11/24/2023   Time: 09:41 AM    Patient Name: Bobby Sampson      Medical Record Number: [id number]  Date of Birth: Nov 04, 1961 Sex: Male         Room/Bed: 4W08C/4W08C-01 Payor Info: Payor: [employer]vertising copy[employer] MEDICARE / Plan: Suan Halter DUAL COMPLETE / Product Type: *No Product type* /    Admit Date/Time:  11/17/2023  1:45 PM  Primary Diagnosis:  CVA (cerebral vascular accident) [facility])  Hospital Problems: Principal Problem:   CVA (cerebral vascular accident) [facility])    Expected Discharge Date: Expected Discharge Date: 11/26/23  Team Members Present: Physician leading conference: Dr. Claudette Laws Social Worker Present: Cecile Sheerer, LCSWA Nurse Present: Chana Bode, RN PT Present: Midge Minium, PT OT Present: Lou Cal, OT SLP Present: Feliberto Gottron, SLP PPS Coordinator present : Fae Pippin, SLP     Current Status/Progress Goal Weekly Team Focus  Bowel/Bladder      Continent of bowel and bladder: HD   continent       Swallow/Nutrition/ Hydration               ADL's   Setup/Supervision for UB care; CGA for LB care & toileting; Min A for footwear management.   Goals downgraded to CGA due to shorter LOS/patient request to DC on 12/26. No DME needs. HHOT.   Discharge planning.    Mobility   bed mobility-independent, sit to stand and stand pivot transfer with RW and CGA, gait x130 feet with RW and CGA, verbal cues provided for safety of L LE positioning.   downgraded goals to CGA 2/2 to shorter LOS/pt request to D/C on 12/26.  MD ordered B knee brace, family training scheduled 12/24 with pt wife, pt has all equipment needed. follow up HHPT    Communication                Safety/Cognition/ Behavioral Observations  Supervision   Mod I   anticipatory awareness, alternating attention, recall with use of strategies    Pain     N/A            Skin      N/A            Discharge Planning:  HOme with wife and daughter-wife works and daughter works third shift. Pt has aide 3 hrs M-F and two hours on the weekend. Goes to HD M,W,F. Fam edu tomorrow 9am-12pm with pt wife. SW will confirm there are no barriers to discharge.   Team Discussion: Patient post CVA with neuropathy and ESRD/HD; medically stable per MD.  Working on cognitive deficits; awareness, memory and attention.   Patient on target to meet rehab goals: yes, currently needs set up - supervision for upper body care.  Able to ambulate up to 130' with CGA.    *See Care Plan and progress notes for long and short-term goals.   Revisions to Treatment Plan:  SLP eval completed.   Teaching Needs: Safety, medications, skin care, transfers, toileting, etc.   Current Barriers to Discharge: Decreased caregiver support, Home enviroment access/layout, and Hemodialysis  Possible Resolutions to Barriers: Family education HH follow up services     Medical Summary Current Status: ESRD going well, DM2 controlled,  Barriers to Discharge: Medical stability   Possible Resolutions to [employer] Focus: coordinate with Nephro in regards toHD, caregiver ed prior to d/c   Continued Need for Acute Rehabilitation Level of Care: The patient requires daily medical management by  a physician with specialized training in physical medicine and rehabilitation for the following reasons: Direction of a multidisciplinary physical rehabilitation program to maximize functional independence : Yes Medical management of patient stability for increased activity during participation in an intensive rehabilitation regime.: Yes Analysis of laboratory values and/or radiology reports with any subsequent need for medication adjustment and/or medical intervention. : Yes   I attest that I was present, lead the team conference, and concur with the assessment and plan of the team.   Chana Bode B 11/26/2023, 8:04 AM

## 2023-11-26 NOTE — Progress Notes (Signed)
Patient ID: Bobby Sampson, male   DOB: 1961-02-07, 62 y.o.   MRN: [id number]  SW spoke with pt wife and confirm TTB received. No HHA preference.   HHA- SW sent referral to [facility] and referral declined.   SW sent referral to [facility] and waiting on follow-up.  *referral accepted  1425-SW called pt wife to inform on HHA above and reports she already spoke with liaison [facility].   Declined HHAs [facility]  Cecile Sheerer, MSW, LCSW Office: [phone] Cell: [phone] Fax: [phone]

## 2023-11-26 NOTE — Progress Notes (Signed)
Bobby Sampson Progress Note   Subjective: Being discharged home today. Wife (it's her birthday today!) at bedside. He is upset says they took too much fluid off last HD, believes no one listened to him. Will need lower EDW on discharge.      Objective Vitals:   11/25/23 0833 11/25/23 1524 11/25/23 2007 11/26/23 0501  BP: 109/69 106/81 114/64 104/69  Pulse: 91 73 73 70  Resp: 18 18 16 16   Temp: (!) 97.5 F (36.4 C) 97.8 F (36.6 C) 98.3 F (36.8 C) 98.4 F (36.9 C)  TempSrc: Oral Oral Oral Oral  SpO2: 100% 94% 100% 92%  Weight:    84.9 kg  Height:       Physical Exam General: WN, WD pleasant male in NAD Heart: S1,S2 RRR No M/R/G Lungs: CTAB Abdomen: NABS, NT Extremities: no LE edema Dialysis Access: R AVF + T/B   Additional Objective Labs: Basic Metabolic Panel: Recent Labs  Lab 11/22/23 1335 11/24/23 1344  NA 132* 130*  K 4.9 5.3*  CL 91* 89*  CO2 25 24  GLUCOSE 275* 256*  BUN 55* 59*  CREATININE 10.39* 10.58*  CALCIUM 9.4 9.8  PHOS 5.0* 5.5*   Liver Function Tests: Recent Labs  Lab 11/22/23 1335 11/24/23 1344  ALBUMIN 3.6 3.6   No results for input(s): "LIPASE", "AMYLASE" in the last 168 hours. CBC: Recent Labs  Lab 11/22/23 1335 11/24/23 1344  WBC 7.7 7.7  HGB 12.9* 12.6*  HCT 40.4 38.8*  MCV 84.2 82.7  PLT 240 254   Blood Culture No results found for: "SDES", "SPECREQUEST", "CULT", "REPTSTATUS"  Cardiac Enzymes: No results for input(s): "CKTOTAL", "CKMB", "CKMBINDEX", "TROPONINI" in the last 168 hours. CBG: Recent Labs  Lab 11/25/23 0543 11/25/23 1122 11/25/23 1621 11/25/23 2037 11/26/23 0606  GLUCAP 167* 167* 259* 282* 213*   Iron Studies: No results for input(s): "IRON", "TIBC", "TRANSFERRIN", "FERRITIN" in the last 72 hours. @lablastinr3 @ Studies/Results: No results found. Medications:   aspirin  81 mg Oral Daily   atorvastatin  80 mg Oral QHS   buPROPion  150 mg Oral Daily   calcitRIOL  0.5 mcg Oral Q  M,W,F-1800   calcium acetate  1,334 mg Oral BID WC   Chlorhexidine Gluconate Cloth  6 each Topical Q0600   Chlorhexidine Gluconate Cloth  6 each Topical Q0600   clopidogrel  75 mg Oral Daily   dorzolamide-timolol  1 drop Both Eyes BID   FLUoxetine  20 mg Oral QHS   heparin  5,000 Units Subcutaneous Q8H   insulin aspart  0-6 Units Subcutaneous TID WC   insulin glargine-yfgn  14 Units Subcutaneous BID   latanoprost  1 drop Left Eye QHS   melatonin  3 mg Oral QHS   metoprolol succinate  25 mg Oral Daily   pantoprazole  40 mg Oral QHS     Dialysis Orders: MWF - [facility] [place] ([address] - [facility] team 4hr, 450/800, EDW 94.8kg (under his dry weight and this will need to be adjusted), 2K/2.5Ca, AVF, no heparin - Calcitriol 0.72mcg q HD - Mircera q 4 week - Venofer 100mg  x 10 (in middle of this) - 2.5L UFG max per cardiologist   Assessment/Plan: 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 Was out of window for thrombolytics per notes. 2. ESRD: Usual MWF schedule. Next HD at OP center.  3. HTN/volume:  BP better, no edema on exam. 2.5L UFG max per notes. No UF with HD  yesterday.  By weight will need to be adjusted at time of discharge.  They are planning on discharging him go approximately 12/26 4. Anemia: Hgb 12.9, no ESA for now. 5. Secondary hyperparathyroidism:  Ca ok, Phos in goal.  Increased Phoslo dose on 12/18. Continue VDRA. 6. T2DM 7. Hx prior CVA: On statin + plavix 8. HFpEF  Bobby Pascal H. Corvin Sorbo NP-C 11/26/2023, 11:38 AM  [employer] [phone]

## 2023-11-26 NOTE — Plan of Care (Signed)
  Problem: RH Memory Goal: LTG Patient will use memory compensatory aids to (SLP) Description: LTG:  Patient will use memory compensatory aids to recall biographical/new, daily complex information with cues (SLP) Outcome: Not Met (goal not addressed)   Problem: RH Attention Goal: LTG Patient will demonstrate this level of attention during functional activites (SLP) Description: LTG:  Patient will will demonstrate this level of attention during functional activites (SLP) Outcome: Not Met (goal not addressed)   Problem: RH Awareness Goal: LTG: Patient will demonstrate awareness during functional activites type of (SLP) Description: LTG: Patient will demonstrate awareness during functional activites type of (SLP) Outcome: Not Met (goal not addressed)

## 2023-11-27 NOTE — Progress Notes (Signed)
 Speech Language Pathology Assessment and Plan  Patient Details  Name: Bobby Sampson MRN: [id number] Date of Birth: 05-Feb-1961  SLP Diagnosis: Dysphagia  Rehab Potential: Good ELOS: 12/26   Today's Date: 11/24/2023 SLP Individual Time: 4098-1191 SLP Individual Time Calculation (min): 30 min  Hospital Problem: Principal Problem:   Stroke-like symptoms Active Problems:   Type 2 diabetes mellitus treated with insulin (HCC)  Past Medical History:  Past Medical History:  Diagnosis Date   Diabetes mellitus without complication (HCC)    Hypertension    Neuropathy    Renal disorder    Stroke (HCC)    2016   Past Surgical History:  Past Surgical History:  Procedure Laterality Date   INTRAMEDULLARY (IM) NAIL INTERTROCHANTERIC Right 11/02/2021   Procedure: INTRAMEDULLARY (IM) NAIL INTERTROCHANTRIC;  Surgeon: Juanell Fairly, MD;  Location: [facility] ORS;  Service: Orthopedics;  Laterality: Right;   TONSILLECTOMY      Assessment / Plan / Recommendation Clinical Impression  Bobby Sampson is a 62 year old male who awoke on the morning of 11/15/2023 with left-sided numbness and weakness which is different from his baseline. He was transported to [facility] emergency department via [facility] who initiated code stroke. 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 Code stroke CT head with no acute process, Aspects 10. 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 MRI revealed acute infarct in the right dorsal pons. Permissive hypertension allowed. Loaded with aspirin and Plavix now on aspirin 81 mg daily and Plavix 75 mg daily for 3 weeks followed by Plavix alone. The patient requires inpatient medicine and rehabilitation evaluations and services for ongoing dysfunction secondary to right dorsal pontine infarct.    Patient seen in am for family education session to address cognition however; during education, pt's wife reported chronic dysphagia and s/s aspiration with thin liquids. Pt's wife further reported coughing til the point of vomiting in some instances. Pt was seated at EOB and presented with thin liquids via straw. He consumed thin liquid with wet vocal quality after swallow of single sips in 1 of 2 trials. In trial of consecutive sips pt presented with immediate coughing after swallow requiring additional time for recovery. SLP recommending continue regular solids and thin liquids with thorough education completed on positioning recommendations, oral hygiene BID, and small single sips of liquid at a time. Pt .would also benefit from an instrumental assessment prior to discharge in order to determine safest least restrictive diet.   Skilled Therapeutic Interventions          BSE and informal assessment measures administered. Please see full report for additional details.     SLP Assessment  Patient will need skilled Speech Lanaguage Pathology Services during CIR admission    Recommendations  SLP Diet Recommendations: Age appropriate regular solids;Thin Liquid Administration via: Cup;Straw Medication Administration: Whole meds with liquid Supervision: Patient able to self feed Compensations: Minimize environmental distractions;Slow rate;Small sips/bites Postural Changes and/or Swallow Maneuvers: Seated upright 90 degrees;Upright 30-60 min after meal Oral Care Recommendations: Oral care BID Patient destination: Home Follow up Recommendations: Home Health SLP Equipment Recommended: None recommended by SLP    SLP Frequency 3 to 5 out of 7 days   SLP Duration  SLP Intensity  SLP Treatment/Interventions 12/26  Minumum of 1-2 x/day, 30 to 90 minutes  Cognitive remediation/compensation;Patient/family education;Therapeutic Activities;Dysphagia/aspiration precaution training  Pain  No/  denies pain   Oral Motor Motor Speech Overall Motor Speech: Appears within functional limits for tasks assessed Respiration: Within functional limits Phonation: Normal Resonance: Within functional limits Articulation: Within functional limitis Intelligibility: Intelligible Motor Speech Errors: Not applicable  Care Tool Care Tool Cognition Ability to hear (with hearing aid or hearing appliances if normally used Ability to hear (with hearing aid or hearing appliances if normally used): 0. Adequate - no difficulty in normal conservation, social interaction, listening to TV   Expression of Ideas and Wants Expression of Ideas and Wants: 4. Without difficulty (complex and basic) - expresses complex messages without difficulty and with speech that is clear and easy to understand   Understanding Verbal and Non-Verbal Content Understanding Verbal and Non-Verbal Content: 4. Understands (complex and basic) - clear comprehension without cues or repetitions  Memory/Recall Ability Memory/Recall Ability : Current season;That he or she is in a hospital/hospital unit    Bedside Swallowing Assessment General Respiratory Status: Room air History of Recent Intubation: No Behavior/Cognition: Alert Self-Feeding Abilities: Able to feed self Patient Positioning: Upright in bed Baseline Vocal Quality: Normal Volitional Cough: Strong Volitional Swallow: Able to elicit  Oral Care Assessment Oral Assessment  (WDL): Within Defined Limits Lips: Symmetrical Teeth: Missing (Comment) Tongue: Pink;Moist Mucous Membrane(s): Moist;Pink Saliva: Moist, saliva free flowing Level of Consciousness: Alert Is patient on any of following O2 devices?: None of the above Nutritional status: Dysphagia Oral Assessment Risk : High Risk Ice Chips Ice chips: Not tested Thin Liquid Thin Liquid: Impaired Presentation: Straw Pharyngeal  Phase Impairments: Cough - Immediate;Wet Vocal Quality Nectar Thick Nectar Thick  Liquid: Not tested Honey Thick Honey Thick Liquid: Not tested Puree Puree: Not tested Solid Solid: Not tested BSE Assessment Suspected Esophageal Findings Suspected Esophageal Findings: Belching;Globus sensation Risk for Aspiration Impact on safety and function: Moderate aspiration risk Other Related Risk Factors: Previous CVA  Short Term Goals: Week 1: SLP Short Term Goal 4 (Week 1): Pt will complete an instrumental swallow assessment to objectively assess pt's oropharyngeal swallow function  Refer to Care Plan for Long Term Goals  Recommendations for other services: None   Discharge Criteria: Patient will be discharged from SLP if patient refuses treatment 3 consecutive times without medical reason, if treatment goals not met, if there is a change in medical status, if patient makes no progress towards goals or if patient is discharged from hospital.  The above assessment, treatment plan, treatment alternatives and goals were discussed and mutually agreed upon: by patient and by family  Renaee Munda 11/24/2023, 5:27 PM

## 2023-11-27 NOTE — Plan of Care (Signed)
  Problem: RH Swallowing Goal: LTG Pt will demonstrate functional change in swallow as evidenced by bedside/clinical objective assessment (SLP) Description: LTG: Patient will demonstrate functional change in swallow as evidenced by bedside/clinical objective assessment (SLP) Outcome: Completed/Met Flowsheets (Taken 11/27/2023 1231) LTG: Patient will demonstrate functional change in swallow as evidenced by bedside/clinical objective assessment: Oropharyngeal swallow

## 2023-12-03 ENCOUNTER — Encounter: Payer: 59 | Admitting: Physical Medicine & Rehabilitation

## 2023-12-10 ENCOUNTER — Encounter: Payer: 59 | Admitting: Physical Medicine & Rehabilitation

## 2023-12-31 ENCOUNTER — Encounter: Payer: 59 | Attending: Physical Medicine & Rehabilitation | Admitting: Physical Medicine & Rehabilitation

## 2024-10-07 ENCOUNTER — Emergency Department
Admission: EM | Admit: 2024-10-07 | Discharge: 2024-10-07 | Disposition: A | Discharge status: Home / Self Care | Attending: Emergency Medicine | Admitting: Emergency Medicine

## 2024-10-07 ENCOUNTER — Other Ambulatory Visit: Payer: Self-pay

## 2024-10-07 ENCOUNTER — Emergency Department

## 2024-10-07 DIAGNOSIS — T829XXA Unspecified complication of cardiac and vascular prosthetic device, implant and graft, initial encounter: Secondary | ICD-10-CM | POA: Insufficient documentation

## 2024-10-07 DIAGNOSIS — Z7901 Long term (current) use of anticoagulants: Secondary | ICD-10-CM | POA: Insufficient documentation

## 2024-10-07 DIAGNOSIS — R051 Acute cough: Secondary | ICD-10-CM | POA: Diagnosis not present

## 2024-10-07 DIAGNOSIS — Z8673 Personal history of transient ischemic attack (TIA), and cerebral infarction without residual deficits: Secondary | ICD-10-CM | POA: Diagnosis not present

## 2024-10-07 LAB — CBG MONITORING, ED: Glucose-Capillary: 78 mg/dL (ref 70–99)

## 2024-10-07 LAB — RESP PANEL BY RT-PCR (RSV, FLU A&B, COVID)  RVPGX2
Influenza A by PCR: NEGATIVE
Influenza B by PCR: NEGATIVE
Resp Syncytial Virus by PCR: NEGATIVE
SARS Coronavirus 2 by RT PCR: NEGATIVE

## 2024-10-07 MED ORDER — BENZONATATE 100 MG PO CAPS: 100.0000 mg | ORAL_CAPSULE | Freq: Three times a day (TID) | ORAL | 0 refills | Status: AC | PRN

## 2024-10-07 MED ORDER — HYDROCODONE BIT-HOMATROP MBR 5-1.5 MG/5ML PO SOLN: 5.0000 mL | Freq: Three times a day (TID) | ORAL | 0 refills | Status: AC | PRN

## 2024-10-07 NOTE — ED Triage Notes (Signed)
  Pt here via EMS from how for bleeding from fistula after dialysis approx 30-40 mins PTA EMS arrival (M,W,F). Pt notes hx of bleeding after dialysis, but not this bad. Pt had stroke last month (right-sided deficits). Denies F, but endorses cough. Pt appears well. On Eliquis. Denies CP/SOB.

## 2024-10-07 NOTE — ED Provider Notes (Signed)
 [facility] Provider Note    Event Date/Time   First MD Initiated Contact with Patient 10/07/24 1512     (approximate)   History   Bleeding/Bruising (Pt here via EMS from how for bleeding from fistula after dialysis approx 30-40 mins PTA EMS arrival (M,W,F). Pt notes hx of bleeding after dialysis, but not this bad. Pt had stroke last month (right-sided deficits). Denies F, but endorses cough. Pt appears well. On Eliquis. Denies CP/SOB.)   HPI  Bobby Sampson is a 63 year old male presenting to the emergency department for evaluation of bleeding fistula.  Patient had dialysis earlier today.  About 30 minutes prior to arrival he noticed bleeding from his dialysis catheter.  Has not had bleeding this bad previously.  Additionally reports that he has had a cough for the past few days, no fevers, unsure if this is making his bleeding worse.     Physical Exam   Triage Vital Signs: ED Triage Vitals  Encounter Vitals Group     BP 10/07/24 1509 (!) 170/86     Girls Systolic BP Percentile --      Girls Diastolic BP Percentile --      Boys Systolic BP Percentile --      Boys Diastolic BP Percentile --      Pulse Rate 10/07/24 1509 78     Resp 10/07/24 1509 17     Temp 10/07/24 1509 97.7 F (36.5 C)     Temp Source 10/07/24 1509 Oral     SpO2 10/07/24 1509 100 %     Weight 10/07/24 1510 200 lb 9.9 oz (91 kg)     Height 10/07/24 1510 5' 10 (1.778 m)     Head Circumference --      Peak Flow --      Pain Score 10/07/24 1503 0     Pain Loc --      Pain Education --      Exclude from Growth Chart --     Most recent vital signs: Vitals:   10/07/24 1509 10/07/24 1700  BP: (!) 170/86 (!) 162/79  Pulse: 78 78  Resp: 17 19  Temp: 97.7 F (36.5 C)   SpO2: 100% 98%     General: Awake, interactive  CV:  Good peripheral perfusion Resp:  Unlabored respirations, lungs clear to auscultation Abd:  Nondistended.  Neuro:  Symmetric facial movement, fluid  speech Other:   There is an Israeli dressing in place over the right upper extremity.  There is a readily palpable thrill over the patient's fistula.    ED Results / Procedures / Treatments   Labs (all labs ordered are listed, but only abnormal results are displayed) Labs Reviewed  RESP PANEL BY RT-PCR (RSV, FLU A&B, COVID)  [id number]  CBG MONITORING, ED     EKG EKG independently reviewed and interpreted by myself demonstrates:    RADIOLOGY Imaging independently reviewed and interpreted by myself demonstrates:  Chest x-Venice Liz without focal consolidation  Formal Radiology Read:  DG Chest Portable 1 View Result Date: 10/07/2024 EXAM: 1 VIEW XRAY OF THE CHEST 10/07/2024 05:14:00 PM COMPARISON: 11/02/2021 CLINICAL HISTORY: Cough. FINDINGS: LUNGS AND PLEURA: No focal pulmonary opacity. No pulmonary edema. No pleural effusion. No pneumothorax. HEART AND MEDIASTINUM: No acute abnormality of the cardiac and mediastinal silhouettes. BONES AND SOFT TISSUES: No acute osseous abnormality. IMPRESSION: 1. No acute cardiopulmonary findings. Electronically signed by: Harrietta Sherry MD 10/07/2024 05:52 PM EST RP Workstation: [id number]    PROCEDURES:  Critical  Care performed: No  Procedures   MEDICATIONS ORDERED IN ED: Medications - No data to display   IMPRESSION / MDM / ASSESSMENT AND PLAN / ED COURSE  I reviewed the triage vital signs and the nursing notes.  Differential diagnosis includes, but is not limited to, fistula bleed, viral illness, pneumonia  Patient's presentation is most consistent with acute presentation with potential threat to life or bodily function.  63 year old male presenting with active fistula bleed, recent cough. When patient's dressing was removed, 2 areas of active bleeding noted consistent with puncture sites from dialysis. Bleeding was able to be controlled with direct pressure over the first site and closed with Dermabond.  The second site had more brisk bleeding,  so Surgicel was applied over the area and a vascular clamp was kept in place for 30 minutes.  On reassessment, bleeding was controlled and this area was also able to be closed with Dermabond.  Patient fortunately hemostasis was able to be obtained.  With patient's cough, did obtain viral swab which was negative and x-Vandella Ord which was without evidence of pneumonia.  Vital signs otherwise stable, no evidence of sepsis.  Do think patient is stable for discharge home.  Will continue with normal dialysis on Monday.  I do think that patient's cough may contribute to his likelihood of rebleed from his fistula, so I did send a prescription for Tessalon Perles as well as Hycodan syrup to his pharmacy.  Reviewed with pharmacist who did note that this was preferable to codeine cough syrups, did feel typical dosing with slightly longer intervals was appropriate in the setting of patient's ESRD.  Ordered for every 8 hours dosing.  Strict return precautions provided.  Patient discharged stable condition.    FINAL CLINICAL IMPRESSION(S) / ED DIAGNOSES   Final diagnoses:  Complication of AV dialysis fistula, initial encounter  Acute cough     Rx / DC Orders   ED Discharge Orders          Ordered    benzonatate (TESSALON) 100 MG capsule  3 times daily PRN        10/07/24 1810    HYDROcodone bit-homatropine (HYCODAN) 5-1.5 MG/5ML syrup  Every 8 hours PRN        10/07/24 1810             Note:  This document was prepared using Dragon voice recognition software and may include unintentional dictation errors.   Levander Slate, MD 10/07/24 [phone]

## 2024-10-07 NOTE — Discharge Instructions (Addendum)
 Attend your dialysis session as normal on Monday.  I sent prescriptions for cough medicine to your pharmacy that you can take as needed.  Return to the ER immediately if you develop recurrent bleeding from your fistula site or for any other new or concerning symptoms.

## 2024-10-07 NOTE — ED Notes (Signed)
 Snack given per request with MD approval
